# Patient Record
Sex: Female | Born: 1960 | Race: Black or African American | Hispanic: No | Smoking: Former smoker
Health system: Southern US, Community
[De-identification: ages and names within clinical notes are randomized; demographics above are authoritative.]

## PROBLEM LIST (undated history)

## (undated) DIAGNOSIS — I2510 Atherosclerotic heart disease of native coronary artery without angina pectoris: Secondary | ICD-10-CM

## (undated) DIAGNOSIS — E0590 Thyrotoxicosis, unspecified without thyrotoxic crisis or storm: Secondary | ICD-10-CM

## (undated) DIAGNOSIS — E039 Hypothyroidism, unspecified: Secondary | ICD-10-CM

## (undated) DIAGNOSIS — F32A Depression, unspecified: Secondary | ICD-10-CM

## (undated) DIAGNOSIS — C3490 Malignant neoplasm of unspecified part of unspecified bronchus or lung: Secondary | ICD-10-CM

## (undated) DIAGNOSIS — J449 Chronic obstructive pulmonary disease, unspecified: Secondary | ICD-10-CM

## (undated) DIAGNOSIS — R011 Cardiac murmur, unspecified: Secondary | ICD-10-CM

## (undated) DIAGNOSIS — D259 Leiomyoma of uterus, unspecified: Secondary | ICD-10-CM

## (undated) DIAGNOSIS — R55 Syncope and collapse: Secondary | ICD-10-CM

## (undated) DIAGNOSIS — I517 Cardiomegaly: Secondary | ICD-10-CM

## (undated) DIAGNOSIS — E119 Type 2 diabetes mellitus without complications: Secondary | ICD-10-CM

## (undated) DIAGNOSIS — J45909 Unspecified asthma, uncomplicated: Secondary | ICD-10-CM

## (undated) DIAGNOSIS — E042 Nontoxic multinodular goiter: Secondary | ICD-10-CM

## (undated) DIAGNOSIS — N939 Abnormal uterine and vaginal bleeding, unspecified: Secondary | ICD-10-CM

## (undated) DIAGNOSIS — D649 Anemia, unspecified: Secondary | ICD-10-CM

## (undated) DIAGNOSIS — F419 Anxiety disorder, unspecified: Secondary | ICD-10-CM

## (undated) DIAGNOSIS — C182 Malignant neoplasm of ascending colon: Secondary | ICD-10-CM

## (undated) DIAGNOSIS — I10 Essential (primary) hypertension: Secondary | ICD-10-CM

## (undated) DIAGNOSIS — R0600 Dyspnea, unspecified: Secondary | ICD-10-CM

## (undated) HISTORY — PX: TUBAL LIGATION: SHX77

---

## 2013-02-01 ENCOUNTER — Ambulatory Visit: Payer: Self-pay | Admitting: Medical

## 2013-02-09 ENCOUNTER — Ambulatory Visit: Payer: Self-pay | Admitting: Medical

## 2013-09-02 ENCOUNTER — Ambulatory Visit: Payer: Self-pay | Admitting: Medical

## 2014-03-09 ENCOUNTER — Ambulatory Visit: Payer: Self-pay | Admitting: Medical

## 2014-10-25 IMAGING — MG MM DIGITAL SCREENING BILAT W/ CAD
1 series · 6 of 6 positions shown · non-contrast
Comparison: none

REASON FOR EXAM: corp scr mammo no order
COMMENTS:

PROCEDURE:     MAM - MAM DGT SCR W/CAD CORP NO ORDER  - February 01, 2013  [DATE]
RESULT:     Bilateral digital screening mammogram with CAD dated 02/01/2013

[R CC · right · 6 of 6 slices shown]
[im 1/6]
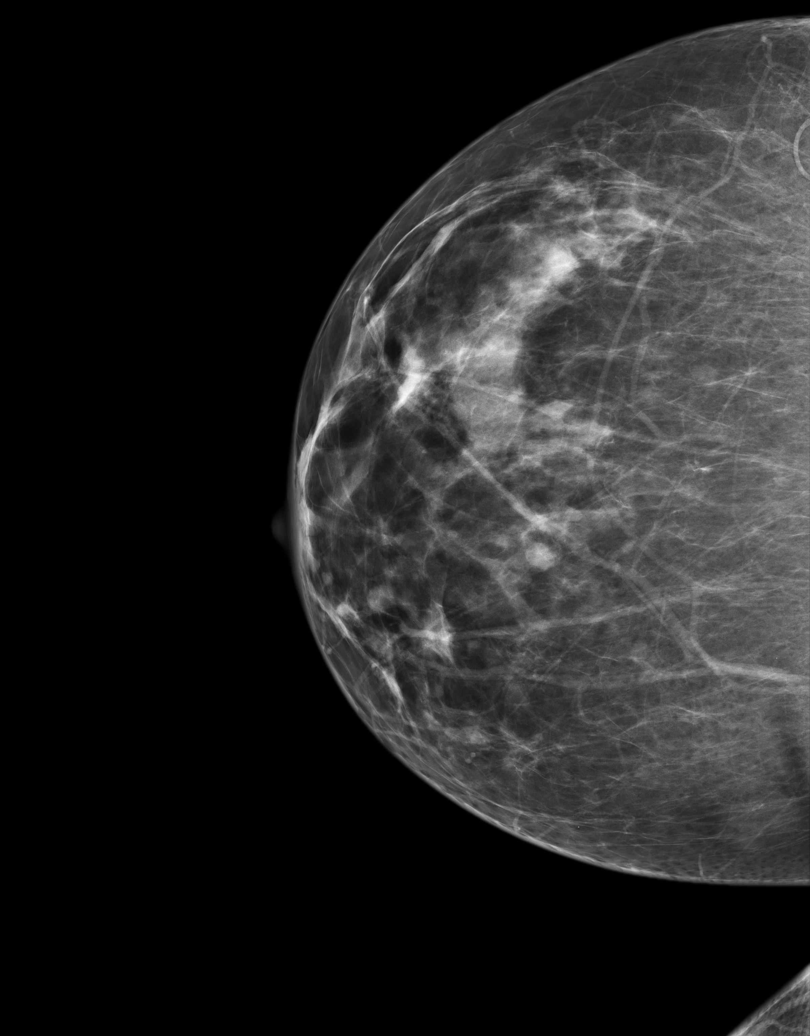
[im 2/6]
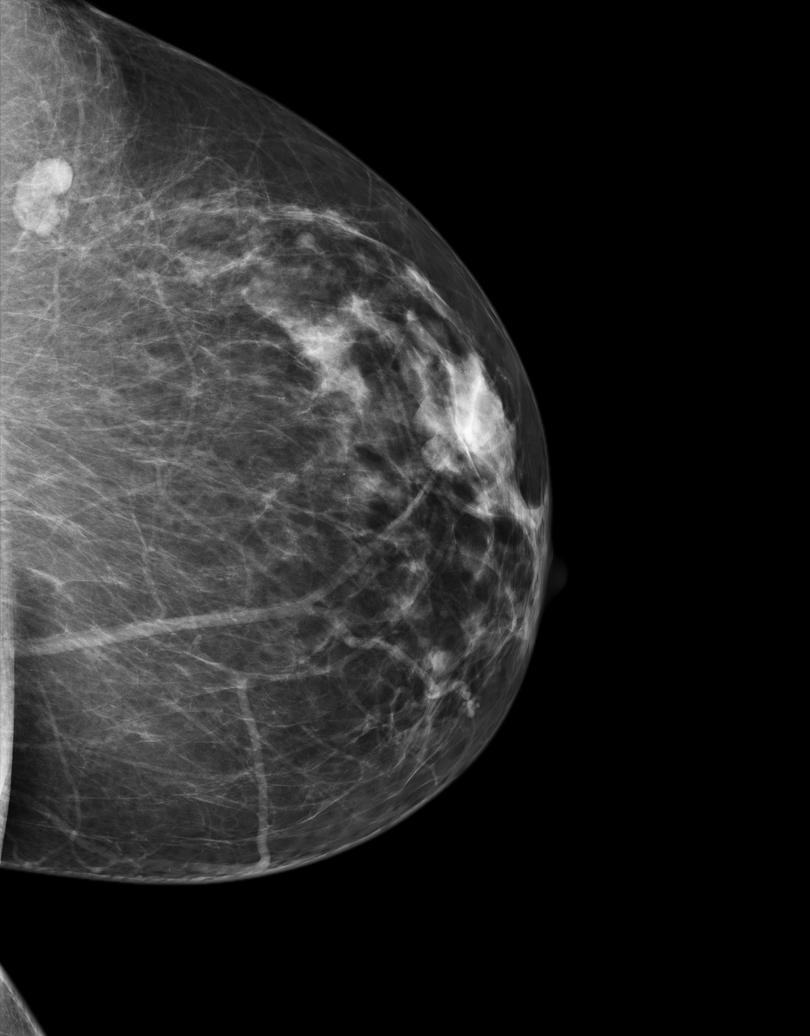
[im 3/6]
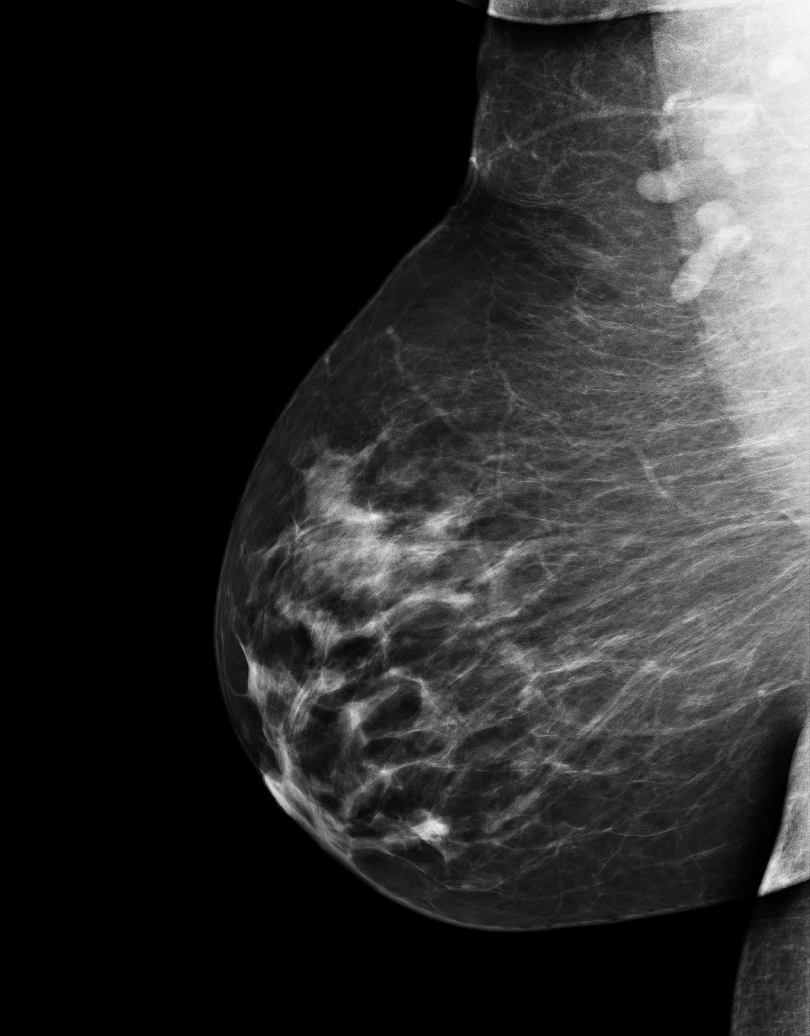
[im 4/6]
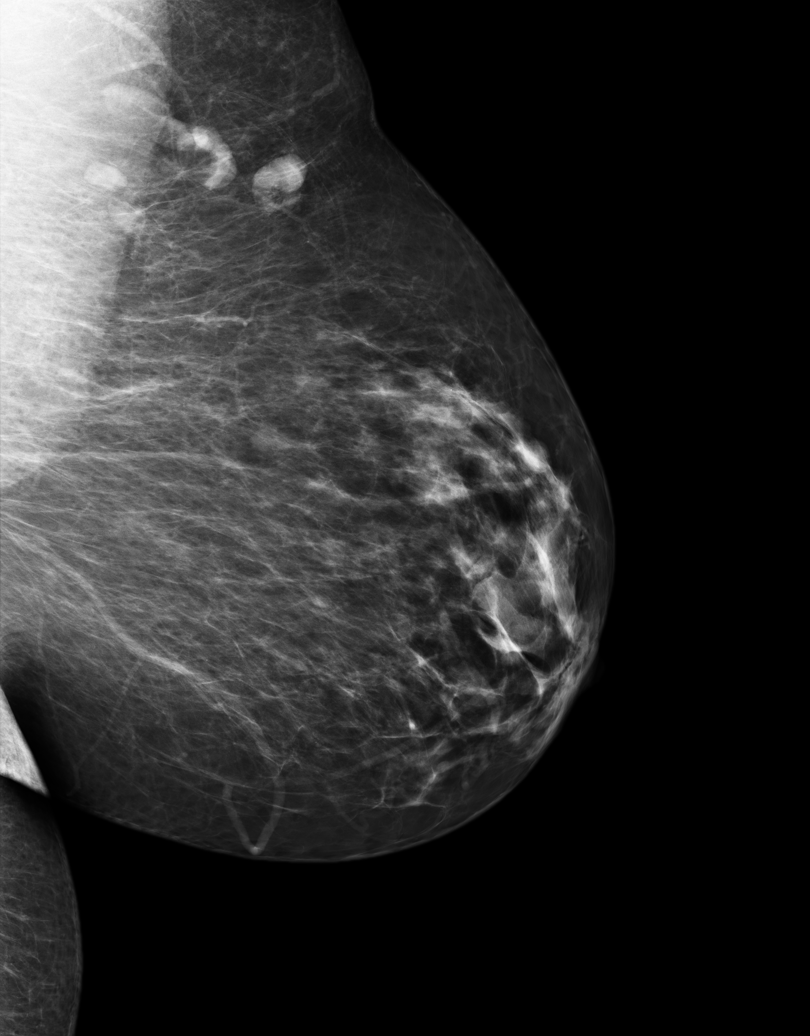
[im 5/6]
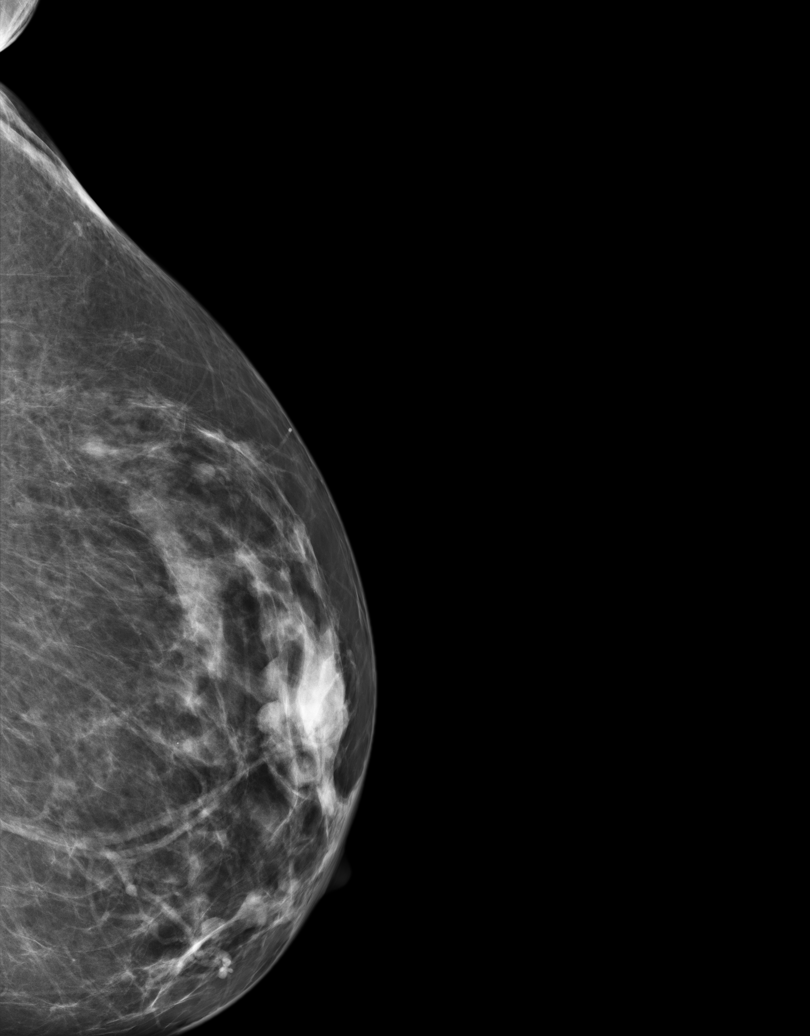
[im 6/6]
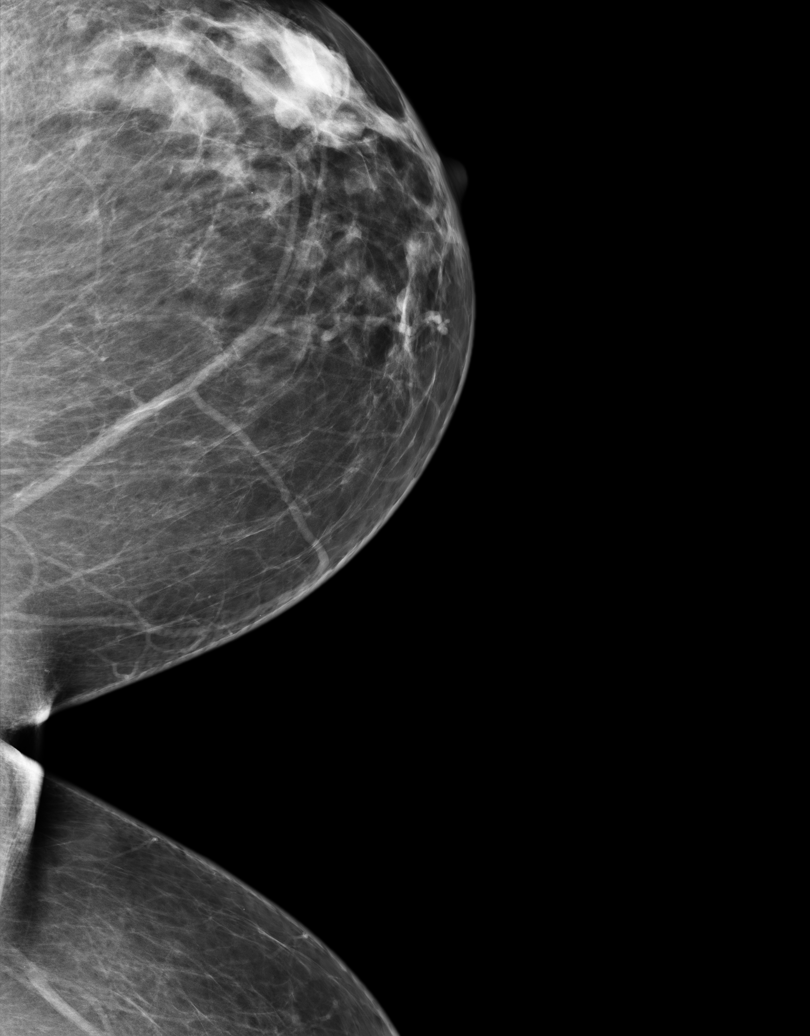

[6 of 6 positions shown; findings below may reference images not displayed]

FINDINGS: BREAST COMPOSITION: The breast composition is HETEROGENEOUSLY DENSE
(glandular tissue is 51-75%) This may decrease the sensitivity of
mammography.

Areas of asymmetric density project within the retrorenal portion of the
right breast the superior and inferior location also medial and laterally.
An area of asymmetric density projects in the prepectoralis superior lateral
portion of the left breast. Further evaluation with magnification
compression imaging of these regions is recommended.

There is no further radiographic evidence to suggest malignancy.
IMPRESSION: BI-RADS: Category 0 - Need Additional Imaging Evaluation

A NEGATIVE MAMMOGRAM REPORT DOES NOT PRECLUDE BIOPSY OR OTHER EVALUATION OF
A CLINICALLY PALPABLE OR OTHERWISE SUSPICIOUS MASS OR LESION. BREAST CANCER
FALCONER NOT BE DETECTED IN UP TO 10% OF CASES.

## 2015-02-21 ENCOUNTER — Ambulatory Visit
Admit: 2015-02-21 | Disposition: A | Discharge status: Home / Self Care | Payer: Self-pay | Attending: Medical | Admitting: Medical

## 2015-06-05 ENCOUNTER — Other Ambulatory Visit: Payer: Self-pay | Admitting: Internal Medicine

## 2015-06-05 DIAGNOSIS — E049 Nontoxic goiter, unspecified: Secondary | ICD-10-CM

## 2015-06-13 ENCOUNTER — Encounter
Admission: RE | Admit: 2015-06-13 | Discharge: 2015-06-13 | Disposition: A | Discharge status: Home / Self Care | Payer: BLUE CROSS/BLUE SHIELD | Source: Ambulatory Visit | Attending: Internal Medicine | Admitting: Internal Medicine

## 2015-06-13 DIAGNOSIS — E049 Nontoxic goiter, unspecified: Secondary | ICD-10-CM

## 2015-06-14 ENCOUNTER — Ambulatory Visit
Admission: RE | Admit: 2015-06-14 | Discharge: 2015-06-14 | Disposition: A | Discharge status: Home / Self Care | Payer: BLUE CROSS/BLUE SHIELD | Source: Ambulatory Visit | Attending: Internal Medicine | Admitting: Internal Medicine

## 2015-06-14 ENCOUNTER — Ambulatory Visit: Payer: BLUE CROSS/BLUE SHIELD

## 2015-06-14 ENCOUNTER — Other Ambulatory Visit: Payer: BLUE CROSS/BLUE SHIELD

## 2015-06-14 DIAGNOSIS — E049 Nontoxic goiter, unspecified: Secondary | ICD-10-CM | POA: Insufficient documentation

## 2015-06-14 MED ORDER — SODIUM IODIDE I-123 3.7 MBQ PO CAPS
148.6700 | ORAL_CAPSULE | Freq: Once | ORAL | Status: AC
  Administered 2015-06-14: 148.67 via ORAL

## 2015-06-15 ENCOUNTER — Encounter
Admission: RE | Admit: 2015-06-15 | Discharge: 2015-06-15 | Disposition: A | Discharge status: Home / Self Care | Payer: BLUE CROSS/BLUE SHIELD | Source: Ambulatory Visit | Attending: Internal Medicine | Admitting: Internal Medicine

## 2015-10-25 ENCOUNTER — Encounter: Payer: Self-pay | Admitting: *Deleted

## 2015-10-26 ENCOUNTER — Encounter: Payer: Self-pay | Admitting: *Deleted

## 2015-10-29 ENCOUNTER — Encounter: Payer: Self-pay | Admitting: *Deleted

## 2015-10-29 ENCOUNTER — Encounter
Admission: RE | Disposition: A | Discharge status: Home / Self Care | Payer: Self-pay | Source: Ambulatory Visit | Attending: Gastroenterology

## 2015-10-29 ENCOUNTER — Ambulatory Visit
Admission: RE | Admit: 2015-10-29 | Discharge: 2015-10-29 | Disposition: A | Discharge status: Home / Self Care | Payer: BLUE CROSS/BLUE SHIELD | Source: Ambulatory Visit | Attending: Gastroenterology | Admitting: Gastroenterology

## 2015-10-29 ENCOUNTER — Ambulatory Visit: Payer: BLUE CROSS/BLUE SHIELD | Admitting: Anesthesiology

## 2015-10-29 DIAGNOSIS — E119 Type 2 diabetes mellitus without complications: Secondary | ICD-10-CM | POA: Insufficient documentation

## 2015-10-29 DIAGNOSIS — J45909 Unspecified asthma, uncomplicated: Secondary | ICD-10-CM | POA: Insufficient documentation

## 2015-10-29 DIAGNOSIS — I10 Essential (primary) hypertension: Secondary | ICD-10-CM | POA: Insufficient documentation

## 2015-10-29 DIAGNOSIS — J449 Chronic obstructive pulmonary disease, unspecified: Secondary | ICD-10-CM | POA: Insufficient documentation

## 2015-10-29 DIAGNOSIS — D120 Benign neoplasm of cecum: Secondary | ICD-10-CM | POA: Insufficient documentation

## 2015-10-29 DIAGNOSIS — F17200 Nicotine dependence, unspecified, uncomplicated: Secondary | ICD-10-CM | POA: Diagnosis not present

## 2015-10-29 DIAGNOSIS — Z79899 Other long term (current) drug therapy: Secondary | ICD-10-CM | POA: Diagnosis not present

## 2015-10-29 DIAGNOSIS — Z7984 Long term (current) use of oral hypoglycemic drugs: Secondary | ICD-10-CM | POA: Diagnosis not present

## 2015-10-29 DIAGNOSIS — E0520 Thyrotoxicosis with toxic multinodular goiter without thyrotoxic crisis or storm: Secondary | ICD-10-CM | POA: Insufficient documentation

## 2015-10-29 DIAGNOSIS — Z1211 Encounter for screening for malignant neoplasm of colon: Secondary | ICD-10-CM | POA: Insufficient documentation

## 2015-10-29 DIAGNOSIS — K621 Rectal polyp: Secondary | ICD-10-CM | POA: Diagnosis not present

## 2015-10-29 HISTORY — DX: Essential (primary) hypertension: I10

## 2015-10-29 HISTORY — DX: Type 2 diabetes mellitus without complications: E11.9

## 2015-10-29 HISTORY — DX: Atherosclerotic heart disease of native coronary artery without angina pectoris: I25.10

## 2015-10-29 HISTORY — DX: Unspecified asthma, uncomplicated: J45.909

## 2015-10-29 HISTORY — DX: Chronic obstructive pulmonary disease, unspecified: J44.9

## 2015-10-29 HISTORY — PX: COLONOSCOPY WITH PROPOFOL: SHX5780

## 2015-10-29 HISTORY — DX: Thyrotoxicosis, unspecified without thyrotoxic crisis or storm: E05.90

## 2015-10-29 HISTORY — DX: Nontoxic multinodular goiter: E04.2

## 2015-10-29 HISTORY — DX: Cardiomegaly: I51.7

## 2015-10-29 SURGERY — COLONOSCOPY WITH PROPOFOL
Anesthesia: General

## 2015-10-29 MED ORDER — SODIUM CHLORIDE 0.9 % IV SOLN
INTRAVENOUS | Status: DC
  Administered 2015-10-29: 1000 mL via INTRAVENOUS

## 2015-10-29 MED ORDER — PROPOFOL 500 MG/50ML IV EMUL
INTRAVENOUS | Status: DC | PRN
  Administered 2015-10-29: 125 ug/kg/min via INTRAVENOUS

## 2015-10-29 MED ORDER — METHYLENE BLUE 1 % INJ SOLN
INTRAMUSCULAR | Status: DC
Start: 2015-10-29 — End: 2015-10-29
  Filled 2015-10-29: qty 1

## 2015-10-29 MED ORDER — FENTANYL CITRATE (PF) 100 MCG/2ML IJ SOLN
INTRAMUSCULAR | Status: DC | PRN
  Administered 2015-10-29: 50 ug via INTRAVENOUS

## 2015-10-29 MED ORDER — MIDAZOLAM HCL 5 MG/5ML IJ SOLN
INTRAMUSCULAR | Status: DC | PRN
  Administered 2015-10-29: 1 mg via INTRAVENOUS

## 2015-10-29 MED ORDER — SODIUM CHLORIDE 0.9 % IJ SOLN
INTRAMUSCULAR | Status: DC | PRN
  Administered 2015-10-29: 09:00:00

## 2015-10-29 MED ORDER — SODIUM CHLORIDE 0.9 % IJ SOLN
INTRAMUSCULAR | Status: DC | PRN
  Administered 2015-10-29: 10 mL via INTRAVENOUS

## 2015-10-29 MED ORDER — PROPOFOL 10 MG/ML IV BOLUS
INTRAVENOUS | Status: DC | PRN
  Administered 2015-10-29: 50 mg via INTRAVENOUS

## 2015-10-29 MED ORDER — SODIUM CHLORIDE 0.9 % IV SOLN
INTRAVENOUS | Status: DC
Start: 2015-10-29 — End: 2015-10-29

## 2015-10-29 NOTE — H&P (Signed)
  Primary Care Physician:  Vista Mink, FNP  Pre-Procedure History & Physical: HPI:  Alexis Garcia is a 54 y.o. female is here for an colonoscopy.   Past Medical History  Diagnosis Date  . Cardiomegaly   . Cardiovascular disease   . COPD (chronic obstructive pulmonary disease) ([place])   . Hypertension   . Goiter, nontoxic, multinodular   . Thyrotoxicosis   . Diabetes mellitus without complication ([place])   . Asthma     Past Surgical History  Procedure Laterality Date  . Cesarean section      Prior to Admission medications   Medication Sig Start Date End Date Taking? Authorizing Provider  benazepril-hydrochlorthiazide (LOTENSIN HCT) 10-12.5 MG tablet Take 1 tablet by mouth daily.   Yes Historical Provider, MD  Levothyroxine Sodium (SYNTHROID PO) Take by mouth.   Yes Historical Provider, MD  lisinopril (PRINIVIL,ZESTRIL) 10 MG tablet Take 10 mg by mouth daily.   Yes Historical Provider, MD  metFORMIN (GLUCOPHAGE) 500 MG tablet Take by mouth 2 (two) times daily with a meal.   Yes Historical Provider, MD  metoprolol tartrate (LOPRESSOR) 25 MG tablet Take 25 mg by mouth 2 (two) times daily.   Yes Historical Provider, MD  propranolol (INDERAL) 10 MG tablet Take 10 mg by mouth 3 (three) times daily.   Yes Historical Provider, MD    Allergies as of 10/01/2015  . (Not on File)    History reviewed. No pertinent family history.  Social History   Social History  . Marital Status: Divorced    Spouse Name: N/A  . Number of Children: N/A  . Years of Education: N/A   Occupational History  . Not on file.   Social History Main Topics  . Smoking status: Current Every Day Smoker  . Smokeless tobacco: Not on file  . Alcohol Use: Not on file  . Drug Use: Not on file  . Sexual Activity: Not on file   Other Topics Concern  . Not on file   Social History Narrative     Physical Exam: BP 160/77 mmHg  Pulse 50  Temp(Src) 96.9 F (36.1 C) (Oral)  Resp 20  Ht 5\' 6"   (1.676 m)  Wt 99.791 kg (220 lb)  BMI 35.53 kg/m2  SpO2 100% General:   Alert,  pleasant and cooperative in NAD Head:  Normocephalic and atraumatic. Neck:  Supple; no masses or thyromegaly. Lungs:  Clear throughout to auscultation.    Heart:  Regular rate and rhythm. Abdomen:  Soft, nontender and nondistended. Normal bowel sounds, without guarding, and without rebound.   Neurologic:  Alert and  oriented x4;  grossly normal neurologically.  Impression/Plan: Alexis Garcia is here for an colonoscopy to be performed for screening  Risks, benefits, limitations, and alternatives regarding  colonoscopy have been reviewed with the patient.  Questions have been answered.  All parties agreeable.   Josefine Class, MD  10/29/2015, 8:25 AM

## 2015-10-29 NOTE — Anesthesia Preprocedure Evaluation (Signed)
Anesthesia Evaluation  Patient identified by MRN, date of birth, ID band Patient awake    Reviewed: Allergy & Precautions, H&P , NPO status , Patient's Chart, lab work & pertinent test results  History of Anesthesia Complications Negative for: history of anesthetic complications  Airway Mallampati: III  TM Distance: >3 FB Neck ROM: full    Dental  (+) Poor Dentition, Missing, Upper Dentures   Pulmonary neg shortness of breath, asthma , COPD, Current Smoker,    Pulmonary exam normal breath sounds clear to auscultation       Cardiovascular Exercise Tolerance: Good hypertension, (-) angina(-) Past MI and (-) DOE Normal cardiovascular exam Rhythm:regular Rate:Normal     Neuro/Psych negative neurological ROS  negative psych ROS   GI/Hepatic negative GI ROS, Neg liver ROS,   Endo/Other  diabetes, Type 2Hyperthyroidism   Renal/GU negative Renal ROS  negative genitourinary   Musculoskeletal   Abdominal   Peds  Hematology negative hematology ROS (+)   Anesthesia Other Findings Past Medical History:   Cardiomegaly                                                 Cardiovascular disease                                       COPD (chronic obstructive pulmonary disease) (*              Hypertension                                                 Goiter, nontoxic, multinodular                               Thyrotoxicosis                                               Diabetes mellitus without complication (HCC)                 Asthma                                                      Past Surgical History:   CESAREAN SECTION                                             BMI    Body Mass Index   35.52 kg/m 2    Patient has medical clearance for this procedure   Reproductive/Obstetrics negative OB ROS                             Anesthesia Physical Anesthesia Plan  ASA: III  Anesthesia Plan:  General   Post-op Pain Management:    Induction:   Airway Management Planned:   Additional Equipment:   Intra-op Plan:   Post-operative Plan:   Informed Consent: I have reviewed the patients History and Physical, chart, labs and discussed the procedure including the risks, benefits and alternatives for the proposed anesthesia with the patient or authorized representative who has indicated his/her understanding and acceptance.   Dental Advisory Given  Plan Discussed with: Anesthesiologist, CRNA and Surgeon  Anesthesia Plan Comments:         Anesthesia Quick Evaluation

## 2015-10-29 NOTE — Anesthesia Postprocedure Evaluation (Signed)
Anesthesia Post Note  Patient: Alexis Garcia  Procedure(s) Performed: Procedure(s) (LRB): COLONOSCOPY WITH PROPOFOL (N/A)  Patient location during evaluation: Endoscopy Anesthesia Type: General Level of consciousness: awake and alert Pain management: pain level controlled Vital Signs Assessment: post-procedure vital signs reviewed and stable Respiratory status: spontaneous breathing, nonlabored ventilation, respiratory function stable and patient connected to nasal cannula oxygen Cardiovascular status: blood pressure returned to baseline and stable Postop Assessment: no signs of nausea or vomiting Anesthetic complications: no    Last Vitals:  Filed Vitals:   10/29/15 1030 10/29/15 1040  BP: 146/74 143/78  Pulse: 40 40  Temp:    Resp: 14 11    Last Pain:  Filed Vitals:   10/29/15 1040  PainSc: Asleep                 Precious Haws Piscitello

## 2015-10-29 NOTE — Transfer of Care (Signed)
Immediate Anesthesia Transfer of Care Note  Patient: Alexis Garcia  Procedure(s) Performed: Procedure(s): COLONOSCOPY WITH PROPOFOL (N/A)  Patient Location: PACU and Endoscopy Unit  Anesthesia Type:General  Level of Consciousness: awake and alert   Airway & Oxygen Therapy: Patient Spontanous Breathing and Patient connected to nasal cannula oxygen  Post-op Assessment: Report given to RN and Post -op Vital signs reviewed and stable  Post vital signs: Reviewed and stable  Last Vitals:  Filed Vitals:   10/29/15 0813 10/29/15 1000  BP: 160/77   Pulse: 50   Temp: 36.1 C 36.1 C  Resp: 20     Complications: No apparent anesthesia complications

## 2015-10-29 NOTE — Op Note (Signed)
[facility] Gastroenterology Patient Name: Alexis Garcia Procedure Date: 10/29/2015 8:45 AM MRN: [id number] Account #: [id number] Date of Birth: 10-Feb-1961 Admit Type: Outpatient Age: 54 Room: [facility] ENDO ROOM 2 Gender: Female Note Status: Finalized Procedure:         Colonoscopy Indications:       Screening for colorectal malignant neoplasm, This is the                     patient's first colonoscopy Patient Profile:   This is a 54 year old female. Providers:         Gerrit Heck. Rayann Heman, MD Referring MD:      Jordan Likes. Lavena Bullion (Referring MD) Medicines:         Propofol per Anesthesia Complications:     No immediate complications. Procedure:         Pre-Anesthesia Assessment:                    - Prior to the procedure, a History and Physical was                     performed, and patient medications, allergies and                     sensitivities were reviewed. The patient's tolerance of                     previous anesthesia was reviewed.                    After obtaining informed consent, the colonoscope was                     passed under direct vision. Throughout the procedure, the                     patient's blood pressure, pulse, and oxygen saturations                     were monitored continuously. The Colonoscope was                     introduced through the anus and advanced to the the cecum,                     identified by appendiceal orifice and ileocecal valve. The                     colonoscopy was performed without difficulty. The patient                     tolerated the procedure well. The quality of the bowel                     preparation was good. Findings:      The perianal and digital rectal examinations were normal.      A 25 mm polyp was found in the cecum. The polyp was sessile. The polyp       was removed with a piecemeal technique using a hot snare. Resection and       retrieval were complete. Fulguration to ablate the lesion  remnants by       argon plasma at 0.5 liters/minute and 20 watts was successful. To       prevent bleeding after the polypectomy,  three hemostatic clips were       successfully placed. There was no bleeding at the end of the procedure.       PIECEMEAL POLYPECTOMY.      Multiple sessile polyps were found in the rectum. The polyps were small       in size. Not removed due to procedure length      The exam was otherwise without abnormality on direct and retroflexion       views. Impression:        - One 25 mm polyp in the cecum. Resected and retrieved.                     Treated with argon plasma coagulation (APC). Clips were                     placed. PIECEMEAL POLYPECTOMY.                    - Multiple small polyps in the rectum. Not removed due to                     procedure length.                    - The examination was otherwise normal on direct and                     retroflexion views. Recommendation:    - Observe patient in GI recovery unit.                    - Resume regular diet.                    - Continue present medications.                    - Await pathology results.                    - Repeat colonoscopy in 6 months for surveillance after                     piecemeal polypectomy.                    - Return to referring physician.                    - The findings and recommendations were discussed with the                     patient.                    - The findings and recommendations were discussed with the                     patient's family. Procedure Code(s): --- Professional ---                    [phone], Colonoscopy, flexible; with removal of tumor(s),                     polyp(s), or other lesion(s) by snare technique Diagnosis Code(s): --- Professional ---                    Z12.11, Encounter for screening for malignant neoplasm of  colon                    D12.0, Benign neoplasm of cecum                    K62.1, Rectal  polyp CPT copyright 2014 American Medical Association. All rights reserved. The codes documented in this report are preliminary and upon coder review may  be revised to meet current compliance requirements. Mellody Life, MD 10/29/2015 9:56:02 AM This report has been signed electronically. Number of Addenda: 0 Note Initiated On: 10/29/2015 8:45 AM Scope Withdrawal Time: 0 hours 46 minutes 44 seconds  Total Procedure Duration: 0 hours 55 minutes 0 seconds       [facility]

## 2015-10-29 NOTE — Anesthesia Procedure Notes (Signed)
Date/Time: 10/29/2015 8:52 AM Performed by: Kennon Holter Pre-anesthesia Checklist: Patient being monitored, Timeout performed, Suction available, Emergency Drugs available and Patient identified Patient Re-evaluated:Patient Re-evaluated prior to inductionOxygen Delivery Method: Nasal cannula Preoxygenation: Pre-oxygenation with 100% oxygen Intubation Type: IV induction

## 2015-10-30 LAB — GLUCOSE, CAPILLARY: GLUCOSE-CAPILLARY: 96 mg/dL (ref 65–99)

## 2015-10-30 LAB — SURGICAL PATHOLOGY

## 2015-11-01 ENCOUNTER — Encounter: Payer: Self-pay | Admitting: Gastroenterology

## 2016-02-12 ENCOUNTER — Other Ambulatory Visit: Payer: Self-pay | Admitting: Medical

## 2016-02-12 DIAGNOSIS — Z1231 Encounter for screening mammogram for malignant neoplasm of breast: Secondary | ICD-10-CM

## 2016-02-14 ENCOUNTER — Ambulatory Visit
Admission: RE | Admit: 2016-02-14 | Discharge: 2016-02-14 | Disposition: A | Discharge status: Home / Self Care | Payer: BLUE CROSS/BLUE SHIELD | Source: Ambulatory Visit | Attending: Medical | Admitting: Medical

## 2016-02-14 DIAGNOSIS — Z1231 Encounter for screening mammogram for malignant neoplasm of breast: Secondary | ICD-10-CM | POA: Diagnosis not present

## 2017-02-23 ENCOUNTER — Other Ambulatory Visit: Payer: Self-pay | Admitting: Family Medicine

## 2017-02-23 DIAGNOSIS — Z1231 Encounter for screening mammogram for malignant neoplasm of breast: Secondary | ICD-10-CM

## 2017-02-25 ENCOUNTER — Ambulatory Visit
Admission: RE | Admit: 2017-02-25 | Discharge: 2017-02-25 | Disposition: A | Discharge status: Home / Self Care | Payer: BLUE CROSS/BLUE SHIELD | Source: Ambulatory Visit | Attending: Family Medicine | Admitting: Family Medicine

## 2017-02-25 DIAGNOSIS — Z1231 Encounter for screening mammogram for malignant neoplasm of breast: Secondary | ICD-10-CM | POA: Insufficient documentation

## 2017-06-04 ENCOUNTER — Other Ambulatory Visit: Payer: Self-pay

## 2017-06-04 DIAGNOSIS — E049 Nontoxic goiter, unspecified: Secondary | ICD-10-CM

## 2017-06-05 LAB — TSH+FREE T4
Free T4: 1.48 ng/dL (ref 0.82–1.77)
TSH: 1.04 u[IU]/mL (ref 0.450–4.500)

## 2017-06-05 LAB — T3: T3, Total: 107 ng/dL (ref 71–180)

## 2017-09-15 ENCOUNTER — Encounter: Payer: Self-pay | Admitting: Obstetrics and Gynecology

## 2017-12-18 ENCOUNTER — Encounter: Payer: Self-pay | Admitting: Obstetrics and Gynecology

## 2018-02-09 ENCOUNTER — Ambulatory Visit (INDEPENDENT_AMBULATORY_CARE_PROVIDER_SITE_OTHER): Payer: BLUE CROSS/BLUE SHIELD | Admitting: Obstetrics and Gynecology

## 2018-02-09 ENCOUNTER — Encounter: Payer: Self-pay | Admitting: Obstetrics and Gynecology

## 2018-02-09 VITALS — BP 130/79 | HR 64 | Ht 66.0 in | Wt 230.3 lb

## 2018-02-09 DIAGNOSIS — E668 Other obesity: Secondary | ICD-10-CM | POA: Diagnosis not present

## 2018-02-09 DIAGNOSIS — N924 Excessive bleeding in the premenopausal period: Secondary | ICD-10-CM | POA: Diagnosis not present

## 2018-02-09 DIAGNOSIS — D259 Leiomyoma of uterus, unspecified: Secondary | ICD-10-CM

## 2018-02-09 DIAGNOSIS — N888 Other specified noninflammatory disorders of cervix uteri: Secondary | ICD-10-CM

## 2018-02-09 DIAGNOSIS — E039 Hypothyroidism, unspecified: Secondary | ICD-10-CM

## 2018-02-09 MED ORDER — NORETHINDRONE ACETATE 5 MG PO TABS: 5.0000 mg | ORAL_TABLET | Freq: Every day | ORAL | 2 refills | Status: DC

## 2018-02-09 NOTE — Progress Notes (Signed)
GYNECOLOGY CLINIC PROGRESS NOTE Subjective:    Alexis Garcia is a 57 y.o. [phone] female who presents with uterine fibroids and abnormal uterine bleeding.  She was referred from Threasa Alpha, [place] (at [facility]). Periods are irregular, lasting 2-3 weeks at a time, sometimes stopping for 2 days and then returning. Bleeding has been irregular over the past year (mostly spotting alternating with a light to moderate cycle) , but since January has become much heavier and longer lasting.  Dysmenorrhea:mild, occurring intermittently throughout menses. Cyclic symptoms include irritability and hot flashes. No intermenstrual bleeding, spotting, or discharge. Patient reports changing a pad q 30 minutes with passage of dime-sized clots during her heaviest days. Denies chest pain, SOB.    Gynecologic History:  Menarche age: 61 LMP unsure, but approximately 2-3 weeks ago.  Current contraception: tubal ligation History of abnormal Pap smear: no. Last pap smear: November 2018, normal.  Family history of breast, uterine or ovarian cancer: unknown, patient was adopted.  Regular self breast exam: yes History of abnormal mammogram: no. Last mammogram: normal.  Next due in April 2019.      OB History  Gravida Para Term Preterm AB Living  4 4 2 1       SAB TAB Ectopic Multiple Live Births               # Outcome Date GA Lbr Len/2nd Weight Sex Delivery Anes PTL Lv  4 Term 89    F Vag-Spont     3 Term 70    M CS-Unspec     2 Para 1985             Birth Comments: miscarriage  1 Preterm 89    F Vag-Spont         Past Medical History:  Diagnosis Date  . Asthma   . Cardiomegaly   . Cardiovascular disease   . COPD (chronic obstructive pulmonary disease) ([place])   . Diabetes mellitus without complication ([place])   . Goiter, nontoxic, multinodular   . Hypertension   . Thyrotoxicosis     Family History  Adopted: Yes    Past Surgical History:  Procedure Laterality Date  .  CESAREAN SECTION    . COLONOSCOPY WITH PROPOFOL N/A 10/29/2015   Procedure: COLONOSCOPY WITH PROPOFOL;  Surgeon: Josefine Class, MD;  Location: [facility] ENDOSCOPY;  Service: Endoscopy;  Laterality: N/A;    Social History   Socioeconomic History  . Marital status: Divorced    Spouse name: Not on file  . Number of children: Not on file  . Years of education: Not on file  . Highest education level: Not on file  Occupational History  . Not on file  Social Needs  . Financial resource strain: Not on file  . Food insecurity:    Worry: Not on file    Inability: Not on file  . Transportation needs:    Medical: Not on file    Non-medical: Not on file  Tobacco Use  . Smoking status: Current Every Day Smoker  . Smokeless tobacco: Never Used  Substance and Sexual Activity  . Alcohol use: Never    Frequency: Never  . Drug use: Never  . Sexual activity: Yes    Birth control/protection: None  Lifestyle  . Physical activity:    Days per week: Not on file    Minutes per session: Not on file  . Stress: Not on file  Relationships  . Social connections:    Talks  on phone: Not on file    Gets together: Not on file    Attends religious service: Not on file    Active member of club or organization: Not on file    Attends meetings of clubs or organizations: Not on file    Relationship status: Not on file  . Intimate partner violence:    Fear of current or ex partner: Not on file    Emotionally abused: Not on file    Physically abused: Not on file    Forced sexual activity: Not on file  Other Topics Concern  . Not on file  Social History Narrative  . Not on file    Current Outpatient Medications on File Prior to Visit  Medication Sig Dispense Refill  . benazepril-hydrochlorthiazide (LOTENSIN HCT) 10-12.5 MG tablet Take 1 tablet by mouth daily.    . hydrochlorothiazide (HYDRODIURIL) 25 MG tablet Take 25 mg by mouth daily.    . Levothyroxine Sodium (SYNTHROID PO) Take 75 mcg by mouth  every morning.     Marland Kitchen lisinopril (PRINIVIL,ZESTRIL) 2.5 MG tablet Take 2.5 mg by mouth daily.    . metFORMIN (GLUCOPHAGE) 500 MG tablet Take by mouth 2 (two) times daily with a meal.    . metoprolol tartrate (LOPRESSOR) 25 MG tablet Take 25 mg by mouth 2 (two) times daily.    . phentermine (ADIPEX-P) 37.5 MG tablet Take 37.5 mg by mouth daily before breakfast.     No current facility-administered medications on file prior to visit.     No Known Allergies  Review of Systems Pertinent items noted in HPI and remainder of comprehensive ROS otherwise negative.    Objective:     BP 130/79   Pulse 64   Ht 5\' 6"  (1.676 m)   Wt 230 lb 4.8 oz (104.5 kg)   BMI 37.17 kg/m   General appearance: alert, no distress and moderately obese Abdomen: soft, non-tender; bowel sounds normal; no masses,  no organomegaly Pelvic: external genitalia normal, rectovaginal septum normal.  Vagina with moderate amount of blood in the vaginal vault.  Attempted to visualize cervix but difficult due to amount of bleeding, even after wiping blood away.  Cervix appears abnormal, unsure if cervix is actually being visualized as it appears that there is a mass protruding from the cervix.  Cervix with area of firm nodularity on left,  and no motion tenderness.  Uterus difficult to palpate due to body habitus but does not feel enlarged.  Adnexae non-palpable, nontender bilaterally.  Extremities: extremities normal, atraumatic, no cyanosis or edema Skin: Skin color, texture, turgor normal. No rashes or lesions Neurologic: Grossly normal      Labs:   Lab Results  Component Value Date   TSH 1.040 06/04/2017    Assessment:    Symptomatic uterine fibroids.   Abnormal uterine bleeding (likely perimenopausal menorrhagia) Cervical mass H/o hypothyroidism Moderate obesity  Plan:   1. Symptomatic uterine fibroids - given generalized management options for symptomatic fibroids as her records from her PCP had not been  received prior to patient's appointment.  Options included hormonal therapy (progesterone methods including Aygestin, Mirena IUD based on size and location of fibroids, hysteroscopic myomectomy if submucosal location, and hysterectomy).  Will await patient's results to see which option may be best.  Currently patient considering a less invasive approach.  2. Abnormal uterine bleeding - moderate bleeding noted on today's exam, also with cervical mass present.  Biopsy of cervical mass performed and sent to pathology.  Unsure if  bleeding is due to perimenopausal status or fibroids, or cervical mass. Was planning to attempt an endometrial biopsy today but unable to perform due to cervical mass. Given Aygestin to help bleeding. Will reorder CBC to reassess patient's hemodynamic stability in light of amount of bleeding today. Last labs done in February by PCP.  Will await records. If fibroids not intracavitary or submucosal, can also consider endometrial ablation for management.  3. Cervical mass - possibly aborting myoma, however cannot r/o malignancy.  Unable to visualize actual cervix. Biopsy done today. Will await pathology results.  4. H/o hypothyroidism - patient has seen Endocrinologist for thyroid issues, currently normal.   5. Moderate obesity - can be contributing to abnormal bleeding.  Patient is at risk for endometrial hyperplasia. Unable to sample endometrium due to presence  Patient to f/u in 1 week.  Will have patient sign medical release from PCP to get most recent labs and ultrasound.     Rubie Maid, MD [facility]

## 2018-02-09 NOTE — Progress Notes (Signed)
Pt is having pain and heavy bleeding from uterine fibroids.

## 2018-02-10 LAB — CBC WITH DIFFERENTIAL/PLATELET
BASOS ABS: 0 10*3/uL (ref 0.0–0.2)
BASOS: 0 %
EOS (ABSOLUTE): 0.2 10*3/uL (ref 0.0–0.4)
Eos: 3 %
Hematocrit: 34.9 % (ref 34.0–46.6)
Hemoglobin: 11.6 g/dL (ref 11.1–15.9)
IMMATURE GRANULOCYTES: 0 %
Immature Grans (Abs): 0 10*3/uL (ref 0.0–0.1)
Lymphocytes Absolute: 2.2 10*3/uL (ref 0.7–3.1)
Lymphs: 39 %
MCH: 28.5 pg (ref 26.6–33.0)
MCHC: 33.2 g/dL (ref 31.5–35.7)
MCV: 86 fL (ref 79–97)
MONOS ABS: 0.4 10*3/uL (ref 0.1–0.9)
Monocytes: 7 %
NEUTROS PCT: 51 %
Neutrophils Absolute: 2.8 10*3/uL (ref 1.4–7.0)
PLATELETS: 324 10*3/uL (ref 150–379)
RBC: 4.07 x10E6/uL (ref 3.77–5.28)
RDW: 14.9 % (ref 12.3–15.4)
WBC: 5.6 10*3/uL (ref 3.4–10.8)

## 2018-02-12 LAB — PATHOLOGY

## 2018-02-13 ENCOUNTER — Encounter: Payer: Self-pay | Admitting: Obstetrics and Gynecology

## 2018-02-16 ENCOUNTER — Ambulatory Visit (INDEPENDENT_AMBULATORY_CARE_PROVIDER_SITE_OTHER): Payer: BLUE CROSS/BLUE SHIELD | Admitting: Obstetrics and Gynecology

## 2018-02-16 ENCOUNTER — Other Ambulatory Visit: Payer: BLUE CROSS/BLUE SHIELD

## 2018-02-16 ENCOUNTER — Encounter: Payer: Self-pay | Admitting: Obstetrics and Gynecology

## 2018-02-16 ENCOUNTER — Other Ambulatory Visit: Payer: Self-pay | Admitting: Obstetrics and Gynecology

## 2018-02-16 VITALS — BP 113/62 | HR 72 | Ht 66.0 in | Wt 227.4 lb

## 2018-02-16 DIAGNOSIS — R5383 Other fatigue: Secondary | ICD-10-CM

## 2018-02-16 DIAGNOSIS — N939 Abnormal uterine and vaginal bleeding, unspecified: Secondary | ICD-10-CM | POA: Diagnosis not present

## 2018-02-16 DIAGNOSIS — D259 Leiomyoma of uterus, unspecified: Secondary | ICD-10-CM

## 2018-02-16 NOTE — Progress Notes (Signed)
GYNECOLOGY PROGRESS NOTE  Subjective:    Patient ID: Alexis Garcia, female    DOB: 1961-05-15, 57 y.o.   MRN: [id number]  HPI  Patient is a 57 y.o. G71P2100 female who presents for 1 week f/u of abnormal uterine bleeding (suspected perimenopausal) and cervical mass (biopsy performed).  She was placed on Aygestin last visit due to heaviness of bleeding.  She notes that the bleeding has slowed some, but still continues to have moderate bleeding. Presents for further workup with endometrial biopsy.   The following portions of the patient's history were reviewed and updated as appropriate: allergies, current medications, past family history, past medical history, past social history, past surgical history and problem list.  Review of Systems A comprehensive review of systems was negative except for: Constitutional: positive for fatigue   Objective:   Blood pressure 113/62, pulse 72, height 5\' 6"  (1.676 m), weight 227 lb 6.4 oz (103.1 kg). General appearance: alert and no distress Abdomen: soft, non-tender; bowel sounds normal; no masses,  no organomegaly Pelvic: external genitalia normal, rectovaginal septum normal.  Vagina with moderate amount of dark red blood.  Cervix not visualized, mass protruding from cervix appears nodular, medium firmness, ~ 2-3 cm.  Uterus mobile, nontender, normal shape and size.  Adnexae non-palpable, nontender bilaterally.     Pathology:  Diagnosis:  CERVIX, BIOPSY:  BENIGN CERVICAL TISSUE WITH SUBMUCOSAL LEIOMYOMA. NEGATIVE FOR DYSPLASIA,  VIRAL EFFECT, AND MALIGNANCY.    Imaging (External Pelvic Ultrasound performed 01/05/2018):  1. Uterus measures 8.0 x 5.71 x 4.25 cm.  Two fibroids noted.  One in the posterior fundus measures 2.34 x 2.6 cm with calcification.  A second fibroid is in the lower uterine segment measuring 2.2 x 2.2 cm. 2.  Endometrium is not identified 3.  Ovaries are normal.  Right ovary measures 3.44 x 2.56 cm the.  The left ovary measures  3.24 x 2.69 cm.  4.  Cul-de-sac is negative for abnormal fluid   Assessment:   Aborting myoma Abnormal uterine bleeding (perimenopausal) Fatigue  Plan:   - Cervical mass with biopsy results noting submucosal fibroid, likely aborting myoma.  Myomectomy performed today with use of tenaculum, twisting fibroid at base of stalk until removal.  Good hemostasis was noted.  Will send to pathology.  - Abnormal uterine bleeding - endometrial biopsy performed today (see below) after aborting fibroid was removed.  Reviewed patient's records from PCP including ultrasound, which noted patient having 2 fibroids, 1 in LUS (likely the aborting one removed today), and one in posterior fundal uterus.  Currently on Aygestin. Desires to stay on Aygestin for now (for at least 3 months), then may consider endometrial ablation after that time.  Will notify patient by phone of results of endometrial biopsy.  - Fatigue - patient notes fatigue, however labs noting normal TSH and relatively normal Hgb/Hct.  Is taking a multi-vitamin which she notes is helping.  - F/u in 3 months to reassess symptoms.     Endometrial Biopsy Procedure Note  The patient is positioned on the exam table in the dorsal lithotomy position. Bimanual exam confirms uterine position and size. A Graves speculum is placed into the vagina. A single toothed tenaculum is placed onto the anterior lip of the cervix. The pipette is placed into the endocervical canal and is advanced to the uterine fundus. Using a piston like technique, with vacuum created by withdrawing the stylus, the endometrial specimen is obtained and transferred to the biopsy container. Minimal bleeding is encountered.  The procedure is well tolerated.   Uterine Position: mid    Uterine Length:  8 cm   Uterine Specimen: Lush  Post procedure instructions are given. The patient is scheduled for follow up appointment.      Rubie Maid, MD [facility]

## 2018-02-16 NOTE — Progress Notes (Signed)
Pt has been really tired, cold with chills this weekend. No other problems or concerns.

## 2018-02-19 LAB — PATHOLOGY

## 2018-03-02 ENCOUNTER — Other Ambulatory Visit: Payer: Self-pay | Admitting: Family Medicine

## 2018-03-02 DIAGNOSIS — Z1231 Encounter for screening mammogram for malignant neoplasm of breast: Secondary | ICD-10-CM

## 2018-03-09 ENCOUNTER — Encounter: Payer: Self-pay | Admitting: Radiology

## 2018-03-09 ENCOUNTER — Ambulatory Visit
Admission: RE | Admit: 2018-03-09 | Discharge: 2018-03-09 | Disposition: A | Discharge status: Home / Self Care | Payer: BLUE CROSS/BLUE SHIELD | Source: Ambulatory Visit | Attending: Family Medicine | Admitting: Family Medicine

## 2018-03-09 DIAGNOSIS — Z1231 Encounter for screening mammogram for malignant neoplasm of breast: Secondary | ICD-10-CM | POA: Diagnosis not present

## 2018-05-12 ENCOUNTER — Telehealth: Payer: Self-pay | Admitting: Obstetrics and Gynecology

## 2018-05-12 DIAGNOSIS — N924 Excessive bleeding in the premenopausal period: Secondary | ICD-10-CM

## 2018-05-12 MED ORDER — NORETHINDRONE ACETATE 5 MG PO TABS: 5.0000 mg | ORAL_TABLET | Freq: Every day | ORAL | 1 refills | Status: DC

## 2018-05-12 NOTE — Telephone Encounter (Signed)
Spoke with pt and informed her that her prescribe had been refilled.

## 2018-05-12 NOTE — Telephone Encounter (Signed)
Patient called requesting a refill on Norethindrone 5 mg. She states she knows she has an upcoming appointment but she is completely out. She uses the [facility] on [address]. Thanks

## 2018-05-18 ENCOUNTER — Ambulatory Visit: Payer: BLUE CROSS/BLUE SHIELD | Admitting: Obstetrics and Gynecology

## 2018-05-18 ENCOUNTER — Encounter: Payer: Self-pay | Admitting: Obstetrics and Gynecology

## 2018-05-18 VITALS — BP 144/78 | HR 71 | Ht 66.0 in | Wt 234.8 lb

## 2018-05-18 DIAGNOSIS — N924 Excessive bleeding in the premenopausal period: Secondary | ICD-10-CM | POA: Insufficient documentation

## 2018-05-18 DIAGNOSIS — D259 Leiomyoma of uterus, unspecified: Secondary | ICD-10-CM | POA: Insufficient documentation

## 2018-05-18 NOTE — Progress Notes (Signed)
Pt stated that since she has been on the medication prescribed she has noticed that her bleeding has stopped.

## 2018-05-18 NOTE — Progress Notes (Signed)
    GYNECOLOGY PROGRESS NOTE  Subjective:    Patient ID: Alexis Garcia, female    DOB: 08-01-1961, 57 y.o.   MRN: [id number]  HPI  Patient is a 57 y.o. G23P2100 female who presents for 3 month f/u of perimenopausal bleeding.  She has a history of fibroids.  She is currently on Aygestin which she notes has stopped her bleeding, however she is complaining that since starting the medication she has had weight gain (at least 10-15 lbs).  Reports that her PCP also recently increased her thyroid medication in case this was the cause of her weight issues.    The following portions of the patient's history were reviewed and updated as appropriate: allergies, current medications, past family history, past medical history, past social history, past surgical history and problem list.  Review of Systems Pertinent items noted in HPI and remainder of comprehensive ROS otherwise negative.   Objective:   Blood pressure (!) 144/78, pulse 71, height 5\' 6"  (1.676 m), weight 234 lb 12.8 oz (106.5 kg), last menstrual period 10/16/2015. General appearance: alert and no distress Exam deferred today.   Assessment:   Perimenopausal abnormal bleeding Uterine fibroids  Plan:   Discussion had with patient that if she desired to continue medication management as it has helped her symptoms, the Aygestin could be changed to a different progesterone, or she could consider a Mirena IUD to possibly eliminate the side effects of weight gain from current medication.  Patient notes that she is now considering surgical management with the endometrial ablation.  She states that she has spoken with her friends and believes this would be the best option at this time.  Discussed risks/benefits of endometrial ablation.  Handout given. Recent endometrial biopsy was negative.  Uterine fibroids noted to be posterior, should not cause problems with ablation but can perform myomectomy if invading the cavity. Will schedule for week of  August 12th (can do the 14th or 16th).  To f/u in 3 weeks for pre-operative exam.   A total of 15 minutes were spent face-to-face with the patient during this encounter and over half of that time dealt with counseling and coordination of care.   Rubie Maid, MD [facility]

## 2018-06-08 ENCOUNTER — Other Ambulatory Visit: Payer: Self-pay

## 2018-06-09 ENCOUNTER — Ambulatory Visit (INDEPENDENT_AMBULATORY_CARE_PROVIDER_SITE_OTHER): Payer: BLUE CROSS/BLUE SHIELD | Admitting: Obstetrics and Gynecology

## 2018-06-09 ENCOUNTER — Encounter: Payer: Self-pay | Admitting: Obstetrics and Gynecology

## 2018-06-09 ENCOUNTER — Encounter
Admission: RE | Admit: 2018-06-09 | Discharge: 2018-06-09 | Disposition: A | Discharge status: Home / Self Care | Payer: BLUE CROSS/BLUE SHIELD | Source: Ambulatory Visit | Attending: Obstetrics and Gynecology | Admitting: Obstetrics and Gynecology

## 2018-06-09 ENCOUNTER — Other Ambulatory Visit: Payer: Self-pay

## 2018-06-09 VITALS — BP 138/77 | HR 74 | Ht 66.0 in | Wt 237.2 lb

## 2018-06-09 DIAGNOSIS — I10 Essential (primary) hypertension: Secondary | ICD-10-CM | POA: Insufficient documentation

## 2018-06-09 DIAGNOSIS — N924 Excessive bleeding in the premenopausal period: Secondary | ICD-10-CM

## 2018-06-09 DIAGNOSIS — R001 Bradycardia, unspecified: Secondary | ICD-10-CM | POA: Diagnosis not present

## 2018-06-09 DIAGNOSIS — Z01818 Encounter for other preprocedural examination: Secondary | ICD-10-CM | POA: Insufficient documentation

## 2018-06-09 DIAGNOSIS — D259 Leiomyoma of uterus, unspecified: Secondary | ICD-10-CM

## 2018-06-09 HISTORY — DX: Hypothyroidism, unspecified: E03.9

## 2018-06-09 HISTORY — DX: Abnormal uterine and vaginal bleeding, unspecified: N93.9

## 2018-06-09 LAB — CBC
HCT: 36.2 % (ref 35.0–47.0)
Hemoglobin: 12.1 g/dL (ref 12.0–16.0)
MCH: 27.7 pg (ref 26.0–34.0)
MCHC: 33.3 g/dL (ref 32.0–36.0)
MCV: 83.2 fL (ref 80.0–100.0)
PLATELETS: 274 10*3/uL (ref 150–440)
RBC: 4.35 MIL/uL (ref 3.80–5.20)
RDW: 18.6 % — AB (ref 11.5–14.5)
WBC: 6.4 10*3/uL (ref 3.6–11.0)

## 2018-06-09 LAB — BASIC METABOLIC PANEL
Anion gap: 7 (ref 5–15)
BUN: 16 mg/dL (ref 6–20)
CALCIUM: 8.8 mg/dL — AB (ref 8.9–10.3)
CO2: 27 mmol/L (ref 22–32)
Chloride: 107 mmol/L (ref 98–111)
Creatinine, Ser: 0.91 mg/dL (ref 0.44–1.00)
GFR calc Af Amer: >60 mL/min (ref >60)
Glucose, Bld: 113 mg/dL — ABNORMAL HIGH (ref 70–99)
Potassium: 4 mmol/L (ref 3.5–5.1)
Sodium: 141 mmol/L (ref 135–145)

## 2018-06-09 NOTE — Progress Notes (Signed)
Pt stated that she is doing well no complaints.  

## 2018-06-09 NOTE — H&P (View-Only) (Signed)
GYNECOLOGY PROGRESS NOTE  Subjective:    Patient ID: Alexis Garcia, female    DOB: December 21, 1960, 57 y.o.   MRN: [id number]  HPI  Patient is a 57 y.o. G46P2100 female who presents for pre-operative examination. She currently has abnormal perimenopausal bleeding, desiring surgical intervention with Hysteroscopy D&C with endometrial ablation. She also has a PMH of fibroids.  Patient denies complaints today. She is currently on Aygestin to manage her bleeding.   The following portions of the patient's history were reviewed and updated as appropriate: allergies, current medications, past family history, past medical history, past social history, past surgical history and problem list.  Review of Systems Pertinent items noted in HPI and remainder of comprehensive ROS otherwise negative.   Objective:   Blood pressure 138/77, pulse 74, height 5\' 6"  (1.676 m), weight 237 lb 3.2 oz (107.6 kg), last menstrual period 10/16/2015. General appearance: alert and no distress Abdomen: soft, non-tender; bowel sounds normal; no masses,  no organomegaly Pelvic: deferred See H&P for remainder of exam details.     Labs:  Lab Results  Component Value Date   WBC 6.4 06/09/2018   HGB 12.1 06/09/2018   HCT 36.2 06/09/2018   MCV 83.2 06/09/2018   PLT 274 06/09/2018   Lab Results  Component Value Date   TSH 1.040 06/04/2017    Pathology:  ENDOMETRIUM, BIOPSY:  SECRETORY ENDOMETRIUM. NO HYPERPLASIA OR CARCINOMA.   Last pap smear 09/2017, normal.   Imaging (External Pelvic Ultrasound performed 01/05/2018):  1. Uterus measures 8.0 x 5.71 x 4.25 cm.  Two fibroids noted.  One in the posterior fundus measures 2.34 x 2.6 cm with calcification.  A second fibroid is in the lower uterine segment measuring 2.2 x 2.2 cm. 2.  Endometrium is not identified 3.  Ovaries are normal.  Right ovary measures 3.44 x 2.56 cm the.  The left ovary measures 3.24 x 2.69 cm.  4.  Cul-de-sac is negative for abnormal  fluid  Assessment:   Abnormal perimenopausal bleeding H/o fibroid uterus  Plan:   - Patient desires surgical management with Hysteroscopy D&C with endometrial ablation ([facility]). May also require hysteroscopic myomectomy depending on location of fibroids.  The risks of surgery were discussed in detail with the patient including but not limited to: bleeding which may require transfusion or reoperation; infection which may require prolonged hospitalization or re-hospitalization and antibiotic therapy; injury to bowel, bladder, ureters and major vessels or other surrounding organs; need for additional procedures including laparotomy; thromboembolic phenomenon, incisional problems and other postoperative or anesthesia complications.  Patient was told that the likelihood that her condition and symptoms will be treated effectively with this surgical management was very high; the postoperative expectations were also discussed in detail. The patient also understands the alternative treatment options which were discussed in full. All questions were answered.  She was told that she will be contacted by our surgical scheduler regarding the time and date of her surgery; routine preoperative instructions of having nothing to eat or drink after midnight on the day prior to surgery and also coming to the hospital 1.5 hours prior to her time of surgery were also emphasized.  She was told she may be called for a preoperative appointment about a week prior to surgery and will be given further preoperative instructions at that visit. Printed patient education handouts about the procedure were given to the patient to review at home.  Surgery scheduled for 06/25/2018.  - Continue Aygestin for management of bleeding.  Rubie Maid, MD [facility]

## 2018-06-09 NOTE — Progress Notes (Signed)
GYNECOLOGY PROGRESS NOTE  Subjective:    Patient ID: Alexis Garcia, female    DOB: [birth year], 57 y.o.   MRN: [id number]  HPI  Patient is a 57 y.o. G45P2100 female who presents for pre-operative examination. She currently has abnormal perimenopausal bleeding, desiring surgical intervention with Hysteroscopy D&C with endometrial ablation. She also has a PMH of fibroids.  Patient denies complaints today. She is currently on Aygestin to manage her bleeding.   The following portions of the patient's history were reviewed and updated as appropriate: allergies, current medications, past family history, past medical history, past social history, past surgical history and problem list.  Review of Systems Pertinent items noted in HPI and remainder of comprehensive ROS otherwise negative.   Objective:   Blood pressure 138/77, pulse 74, height 5\' 6"  (1.676 m), weight 237 lb 3.2 oz (107.6 kg), last menstrual period 10/16/2015. General appearance: alert and no distress Abdomen: soft, non-tender; bowel sounds normal; no masses,  no organomegaly Pelvic: deferred See H&P for remainder of exam details.     Labs:  Lab Results  Component Value Date   WBC 6.4 06/09/2018   HGB 12.1 06/09/2018   HCT 36.2 06/09/2018   MCV 83.2 06/09/2018   PLT 274 06/09/2018   Lab Results  Component Value Date   TSH 1.040 06/04/2017    Pathology:  ENDOMETRIUM, BIOPSY:  SECRETORY ENDOMETRIUM. NO HYPERPLASIA OR CARCINOMA.   Last pap smear 09/2017, normal.   Imaging (External Pelvic Ultrasound performed 01/05/2018):  1. Uterus measures 8.0 x 5.71 x 4.25 cm.  Two fibroids noted.  One in the posterior fundus measures 2.34 x 2.6 cm with calcification.  A second fibroid is in the lower uterine segment measuring 2.2 x 2.2 cm. 2.  Endometrium is not identified 3.  Ovaries are normal.  Right ovary measures 3.44 x 2.56 cm the.  The left ovary measures 3.24 x 2.69 cm.  4.  Cul-de-sac is negative for abnormal  fluid  Assessment:   Abnormal perimenopausal bleeding H/o fibroid uterus  Plan:   - Patient desires surgical management with Hysteroscopy D&C with endometrial ablation ([facility]). May also require hysteroscopic myomectomy depending on location of fibroids.  The risks of surgery were discussed in detail with the patient including but not limited to: bleeding which may require transfusion or reoperation; infection which may require prolonged hospitalization or re-hospitalization and antibiotic therapy; injury to bowel, bladder, ureters and major vessels or other surrounding organs; need for additional procedures including laparotomy; thromboembolic phenomenon, incisional problems and other postoperative or anesthesia complications.  Patient was told that the likelihood that her condition and symptoms will be treated effectively with this surgical management was very high; the postoperative expectations were also discussed in detail. The patient also understands the alternative treatment options which were discussed in full. All questions were answered.  She was told that she will be contacted by our surgical scheduler regarding the time and date of her surgery; routine preoperative instructions of having nothing to eat or drink after midnight on the day prior to surgery and also coming to the hospital 1.5 hours prior to her time of surgery were also emphasized.  She was told she may be called for a preoperative appointment about a week prior to surgery and will be given further preoperative instructions at that visit. Printed patient education handouts about the procedure were given to the patient to review at home.  Surgery scheduled for 06/25/2018.  - Continue Aygestin for management of bleeding.  Rubie Maid, MD [facility]

## 2018-06-09 NOTE — Patient Instructions (Signed)
You are scheduled for surgery on 06/25/2018 for Hysteroscopy with endometrial ablation.  Nothing to eat after midnight on day prior to surgery.  Do not take any medications unless recommended by your provider on day prior to surgery.  Do not take NSAIDs (Motrin, Aleve) or aspirin 7 days prior to surgery.  You may take Tylenol products for minor aches and pains.  You will receive a prescription for pain medications post-operatively.  You will be contacted by phone approximately 1 week prior to surgery to schedule pre-admit appointment.  Please call the office if you have any questions regarding your upcoming surgery.

## 2018-06-09 NOTE — Patient Instructions (Signed)
Your procedure is scheduled on: 06/25/18 Fri Report to Same Day Surgery 2nd floor medical mall [facility] Entrance-take elevator on left to 2nd floor.  Check in with surgery information desk.) To find out your arrival time please call [phone] between 1PM - 3PM on 06/24/18 Thurs  Remember: Instructions that are not followed completely may result in serious medical risk, up to and including death, or upon the discretion of your surgeon and anesthesiologist your surgery may need to be rescheduled.    _x___ 1. Do not eat food after midnight the night before your procedure. You may drink clear liquids up to 2 hours before you are scheduled to arrive at the hospital for your procedure.  Do not drink clear liquids within 2 hours of your scheduled arrival to the hospital.  Clear liquids include  --Water or Apple juice without pulp  --Clear carbohydrate beverage such as ClearFast or Gatorade  --Black Coffee or Clear Tea (No milk, no creamers, do not add anything to                  the coffee or Tea Type 1 and type 2 diabetics should only drink water.  No gum chewing or hard candies.     __x__ 2. No Alcohol for 24 hours before or after surgery.   __x__3. No Smoking or e-cigarettes for 24 prior to surgery.  Do not use any chewable tobacco products for at least 6 hour prior to surgery   ____  4. Bring all medications with you on the day of surgery if instructed.    __x__ 5. Notify your doctor if there is any change in your medical condition     (cold, fever, infections).    x___6. On the morning of surgery brush your teeth with toothpaste and water.  You may rinse your mouth with mouth wash if you wish.  Do not swallow any toothpaste or mouthwash.   Do not wear jewelry, make-up, hairpins, clips or nail polish.  Do not wear lotions, powders, or perfumes. You may wear deodorant.  Do not shave 48 hours prior to surgery. Men may shave face and neck.  Do not bring valuables to the hospital.     [facility] is not responsible for any belongings or valuables.               Contacts, dentures or bridgework may not be worn into surgery.  Leave your suitcase in the car. After surgery it may be brought to your room.  For patients admitted to the hospital, discharge time is determined by your                       treatment team.  _  Patients discharged the day of surgery will not be allowed to drive home.  You will need someone to drive you home and stay with you the night of your procedure.    Please read over the following fact sheets that you were given:   [facility] Preparing for Surgery and or MRSA Information   _x___ Take anti-hypertensive listed below, cardiac, seizure, asthma,     anti-reflux and psychiatric medicines. These include:  1. albuterol (PROVENTIL HFA;VENTOLIN HFA) 108 (90 Base) MCG/ACT inhaler bring to hospital  2.levothyroxine (SYNTHROID, LEVOTHROID) 88 MCG tablet  3.metoprolol tartrate (LOPRESSOR) 25 MG tablet  4.umeclidinium-vilanterol (ANORO ELLIPTA) 62.5-25 MCG/INH AEPB  5.  6.  ____Fleets enema or Magnesium Citrate as directed.   _x___ Use CHG Soap  or sage wipes as directed on instruction sheet   _x___ Use inhalers on the day of surgery and bring to hospital day of surgery  __x__ Stop Metformin and Janumet 2 days prior to surgery.    ____ Take 1/2 of usual insulin dose the night before surgery and none on the morning     surgery.   _x___ Follow recommendations from Cardiologist, Pulmonologist or PCP regarding          stopping Aspirin, Coumadin, Plavix ,Eliquis, Effient, or Pradaxa, and Pletal.  X____Stop Anti-inflammatories such as Advil, Aleve, Ibuprofen, Motrin, Naproxen, Naprosyn, Goodies powders or aspirin products. OK to take Tylenol and                          Celebrex.   _x___ Stop supplements until after surgery.  But may continue Vitamin D, Vitamin B,       and multivitamin.   ____ Bring C-Pap to the hospital.

## 2018-06-10 ENCOUNTER — Encounter: Payer: Self-pay | Admitting: Obstetrics and Gynecology

## 2018-06-10 NOTE — H&P (Signed)
GYNECOLOGY PREOPERATIVE HISTORY AND PHYSICAL   Subjective:  Alexis Garcia is a 57 y.o. [id number] here for surgical management of abnormal perimenopausal bleeding. Patient also with a h/o fibroids. Significant preoperative concerns: H/o COPD  Proposed surgery: Hysteroscopy D&C (with possible myomectomy), endometrial ablation (Minerva)    Pertinent Gynecological History: Menses: flow is moderate and cycles are irregular, bleeding for 2-3 weeks at a time, stopping sometimes for 2-3 days and then resuming Bleeding: abnormal uterine bleeding Contraception: abstinence Last mammogram: normal Date: 02/2018 Last pap: normal Date: 09/2017   Past Medical History:  Diagnosis Date  . Abnormal uterine bleeding   . Asthma   . Cardiomegaly   . Cardiovascular disease   . COPD (chronic obstructive pulmonary disease) ([place])   . Diabetes mellitus without complication ([place])   . Goiter, nontoxic, multinodular   . Hypertension   . Hypothyroidism   . Thyrotoxicosis    Past Surgical History:  Procedure Laterality Date  . CESAREAN SECTION    . COLONOSCOPY WITH PROPOFOL N/A 10/29/2015   Procedure: COLONOSCOPY WITH PROPOFOL;  Surgeon: Josefine Class, MD;  Location: [facility] ENDOSCOPY;  Service: Endoscopy;  Laterality: N/A;   OB History  Gravida Para Term Preterm AB Living  4 4 2 1       SAB TAB Ectopic Multiple Live Births               # Outcome Date GA Lbr Len/2nd Weight Sex Delivery Anes PTL Lv  4 Term 40    F Vag-Spont     3 Term 86    M CS-Unspec     2 Para 1985             Birth Comments: miscarriage  1 Preterm 32    F Vag-Spont       Family History  Adopted: Yes    Social History   Socioeconomic History  . Marital status: Divorced    Spouse name: Not on file  . Number of children: Not on file  . Years of education: Not on file  . Highest education level: Not on file  Occupational History  . Not on file  Social Needs  . Financial resource strain: Not on file  . Food  insecurity:    Worry: Not on file    Inability: Not on file  . Transportation needs:    Medical: Not on file    Non-medical: Not on file  Tobacco Use  . Smoking status: Current Every Day Smoker    Packs/day: 0.50  . Smokeless tobacco: Never Used  Substance and Sexual Activity  . Alcohol use: Never    Frequency: Never  . Drug use: Never  . Sexual activity: Yes    Birth control/protection: None, Post-menopausal  Lifestyle  . Physical activity:    Days per week: Not on file    Minutes per session: Not on file  . Stress: Not on file  Relationships  . Social connections:    Talks on phone: Not on file    Gets together: Not on file    Attends religious service: Not on file    Active member of club or organization: Not on file    Attends meetings of clubs or organizations: Not on file    Relationship status: Not on file  . Intimate partner violence:    Fear of current or ex partner: Not on file    Emotionally abused: Not on file    Physically abused: Not on file  Forced sexual activity: Not on file  Other Topics Concern  . Not on file  Social History Narrative  . Not on file    Current Outpatient Medications on File Prior to Visit  Medication Sig Dispense Refill  . albuterol (PROVENTIL HFA;VENTOLIN HFA) 108 (90 Base) MCG/ACT inhaler Inhale 2 puffs into the lungs every 6 (six) hours as needed (for exercise induced asthma).    . cholecalciferol (VITAMIN D) 400 units TABS tablet Take 400 Units by mouth daily.    . hydrochlorothiazide (HYDRODIURIL) 25 MG tablet Take 25 mg by mouth daily.    Marland Kitchen levothyroxine (SYNTHROID, LEVOTHROID) 88 MCG tablet Take 88 mcg by mouth daily before breakfast.    . lisinopril (PRINIVIL,ZESTRIL) 2.5 MG tablet Take 2.5 mg by mouth every evening.     . metFORMIN (GLUCOPHAGE) 500 MG tablet Take 500 mg by mouth 2 (two) times daily.     . metoprolol tartrate (LOPRESSOR) 25 MG tablet Take 25 mg by mouth 2 (two) times daily.    . Multiple Vitamins-Minerals  (ALIVE ONCE DAILY WOMENS PO) Take 2 tablets by mouth daily. Gummies    . norethindrone (AYGESTIN) 5 MG tablet Take 1 tablet (5 mg total) by mouth daily. (Patient taking differently: Take 5 mg by mouth every evening. ) 30 tablet 1  . Omega-3 Fatty Acids (FISH OIL) 1000 MG CAPS Take 1,000 mg by mouth daily.    . phentermine (ADIPEX-P) 37.5 MG tablet Take 37.5 mg by mouth daily before breakfast.     No current facility-administered medications on file prior to visit.    No Known Allergies    Review of Systems Constitutional: No recent fever/chills/sweats Respiratory: No recent cough/bronchitis Cardiovascular: No chest pain Gastrointestinal: No recent nausea/vomiting/diarrhea Genitourinary: No UTI symptoms Hematologic/lymphatic:No history of coagulopathy or recent blood thinner use    Objective:   Blood pressure 138/77, pulse 74, height 5\' 6"  (1.676 m), weight 237 lb 3.2 oz (107.6 kg). CONSTITUTIONAL: Well-developed, well-nourished female in no acute distress.  HENT:  Normocephalic, atraumatic, External right and left ear normal. Oropharynx is clear and moist EYES: Conjunctivae and EOM are normal. Pupils are equal, round, and reactive to light. No scleral icterus.  NECK: Normal range of motion, supple, no masses SKIN: Skin is warm and dry. No rash noted. Not diaphoretic. No erythema. No pallor. NEUROLOGIC: Alert and oriented to person, place, and time. Normal reflexes, muscle tone coordination. No cranial nerve deficit noted. PSYCHIATRIC: Normal mood and affect. Normal behavior. Normal judgment and thought content. CARDIOVASCULAR: Normal heart rate noted, regular rhythm RESPIRATORY: Effort and breath sounds normal, no problems with respiration noted ABDOMEN: Soft, nontender, nondistended. PELVIC: Deferred MUSCULOSKELETAL: Normal range of motion. No edema and no tenderness. 2+ distal pulses.    Labs: Results for Alexis Garcia, Alexis Garcia (MRN [id number]) as of 06/10/2018 17:45  Ref. Range 02/09/2018  17:21  WBC Latest Ref Range: 3.4 - 10.8 x10E3/uL 5.6  RBC Latest Ref Range: 3.77 - 5.28 x10E6/uL 4.07  Hemoglobin Latest Ref Range: 11.1 - 15.9 g/dL 11.6  HCT Latest Ref Range: 34.0 - 46.6 % 34.9  MCV Latest Ref Range: 79 - 97 fL 86  MCH Latest Ref Range: 26.6 - 33.0 pg 28.5  MCHC Latest Ref Range: 31.5 - 35.7 g/dL 33.2  RDW Latest Ref Range: 12.3 - 15.4 % 14.9  Platelets Latest Ref Range: 150 - 379 x10E3/uL 324     Pathology:  Diagnosis:  CERVIX, BIOPSY:  BENIGN CERVICAL TISSUE WITH SUBMUCOSAL LEIOMYOMA. NEGATIVE FOR DYSPLASIA,  VIRAL EFFECT,  AND MALIGNANCY.    Imaging (External Pelvic Ultrasound performed 01/05/2018):  1. Uterus measures 8.0 x 5.71 x 4.25 cm.  Two fibroids noted.  One in the posterior fundus measures 2.34 x 2.6 cm with calcification.  A second fibroid is in the lower uterine segment measuring 2.2 x 2.2 cm. 2.  Endometrium is not identified 3.  Ovaries are normal.  Right ovary measures 3.44 x 2.56 cm the.  The left ovary measures 3.24 x 2.69 cm.  4.  Cul-de-sac is negative for abnormal fluid    Assessment:    Abnormal perimenopausal bleeding Fibroid uterus    Plan:    Counseling: Procedure, risks, reasons, benefits and complications (including injury to bowel, bladder, major blood vessel, ureter, bleeding, possibility of transfusion, infection, or fistula formation) reviewed in detail. Likelihood of success in alleviating the patient's condition was discussed. Routine postoperative instructions will be reviewed with the patient and her family in detail after surgery.  The patient concurred with the proposed plan, giving informed written consent for the surgery.   Preop testing ordered. Instructions reviewed, including NPO after midnight.     Rubie Maid, MD [facility]

## 2018-06-25 ENCOUNTER — Ambulatory Visit
Admission: RE | Admit: 2018-06-25 | Discharge: 2018-06-25 | Disposition: A | Discharge status: Home / Self Care | Payer: BLUE CROSS/BLUE SHIELD | Source: Ambulatory Visit | Attending: Obstetrics and Gynecology | Admitting: Obstetrics and Gynecology

## 2018-06-25 ENCOUNTER — Ambulatory Visit: Payer: BLUE CROSS/BLUE SHIELD | Admitting: Registered Nurse

## 2018-06-25 ENCOUNTER — Encounter: Payer: Self-pay | Admitting: *Deleted

## 2018-06-25 ENCOUNTER — Other Ambulatory Visit: Payer: Self-pay

## 2018-06-25 ENCOUNTER — Encounter
Admission: RE | Disposition: A | Discharge status: Home / Self Care | Payer: Self-pay | Source: Ambulatory Visit | Attending: Obstetrics and Gynecology

## 2018-06-25 DIAGNOSIS — Z79899 Other long term (current) drug therapy: Secondary | ICD-10-CM | POA: Diagnosis not present

## 2018-06-25 DIAGNOSIS — F17200 Nicotine dependence, unspecified, uncomplicated: Secondary | ICD-10-CM | POA: Diagnosis not present

## 2018-06-25 DIAGNOSIS — D259 Leiomyoma of uterus, unspecified: Secondary | ICD-10-CM | POA: Insufficient documentation

## 2018-06-25 DIAGNOSIS — Z7984 Long term (current) use of oral hypoglycemic drugs: Secondary | ICD-10-CM | POA: Insufficient documentation

## 2018-06-25 DIAGNOSIS — E119 Type 2 diabetes mellitus without complications: Secondary | ICD-10-CM | POA: Insufficient documentation

## 2018-06-25 DIAGNOSIS — D251 Intramural leiomyoma of uterus: Secondary | ICD-10-CM

## 2018-06-25 DIAGNOSIS — N924 Excessive bleeding in the premenopausal period: Secondary | ICD-10-CM | POA: Diagnosis not present

## 2018-06-25 DIAGNOSIS — E039 Hypothyroidism, unspecified: Secondary | ICD-10-CM | POA: Insufficient documentation

## 2018-06-25 DIAGNOSIS — I10 Essential (primary) hypertension: Secondary | ICD-10-CM | POA: Diagnosis not present

## 2018-06-25 DIAGNOSIS — J449 Chronic obstructive pulmonary disease, unspecified: Secondary | ICD-10-CM | POA: Diagnosis not present

## 2018-06-25 DIAGNOSIS — N711 Chronic inflammatory disease of uterus: Secondary | ICD-10-CM | POA: Insufficient documentation

## 2018-06-25 DIAGNOSIS — N938 Other specified abnormal uterine and vaginal bleeding: Secondary | ICD-10-CM

## 2018-06-25 HISTORY — PX: DILITATION & CURRETTAGE/HYSTROSCOPY WITH NOVASURE ABLATION: SHX5568

## 2018-06-25 LAB — POCT PREGNANCY, URINE: Preg Test, Ur: NEGATIVE

## 2018-06-25 LAB — GLUCOSE, CAPILLARY
GLUCOSE-CAPILLARY: 98 mg/dL (ref 70–99)
GLUCOSE-CAPILLARY: 87 mg/dL (ref 70–99)

## 2018-06-25 SURGERY — DILATATION & CURETTAGE/HYSTEROSCOPY WITH NOVASURE ABLATION
Anesthesia: General | Site: Vagina | Wound class: Clean Contaminated

## 2018-06-25 MED ORDER — GLYCOPYRROLATE 0.2 MG/ML IJ SOLN
INTRAMUSCULAR | Status: DC | PRN
  Administered 2018-06-25: 0.2 mg via INTRAVENOUS

## 2018-06-25 MED ORDER — ACETAMINOPHEN 325 MG PO TABS: 650.0000 mg | ORAL_TABLET | ORAL | 1 refills | Status: AC | PRN

## 2018-06-25 MED ORDER — OXYCODONE HCL 5 MG/5ML PO SOLN: 5.0000 mg | Freq: Once | ORAL | Status: DC | PRN

## 2018-06-25 MED ORDER — MIDAZOLAM HCL 2 MG/2ML IJ SOLN
INTRAMUSCULAR | Status: DC | PRN
  Administered 2018-06-25: 2 mg via INTRAVENOUS

## 2018-06-25 MED ORDER — LIDOCAINE HCL (CARDIAC) PF 100 MG/5ML IV SOSY
PREFILLED_SYRINGE | INTRAVENOUS | Status: DC | PRN
  Administered 2018-06-25: 100 mg via INTRAVENOUS

## 2018-06-25 MED ORDER — FENTANYL CITRATE (PF) 100 MCG/2ML IJ SOLN
INTRAMUSCULAR | Status: AC
  Filled 2018-06-25: qty 2

## 2018-06-25 MED ORDER — LACTATED RINGERS IV SOLN: INTRAVENOUS | Status: DC

## 2018-06-25 MED ORDER — LIDOCAINE HCL (PF) 2 % IJ SOLN
INTRAMUSCULAR | Status: AC
  Filled 2018-06-25: qty 10

## 2018-06-25 MED ORDER — FENTANYL CITRATE (PF) 100 MCG/2ML IJ SOLN
25.0000 ug | INTRAMUSCULAR | Status: DC | PRN
  Administered 2018-06-25 (×2): 50 ug via INTRAVENOUS

## 2018-06-25 MED ORDER — FAMOTIDINE 20 MG PO TABS
20.0000 mg | ORAL_TABLET | Freq: Once | ORAL | Status: AC
  Administered 2018-06-25: 20 mg via ORAL

## 2018-06-25 MED ORDER — FENTANYL CITRATE (PF) 100 MCG/2ML IJ SOLN
INTRAMUSCULAR | Status: DC | PRN
  Administered 2018-06-25: 50 ug via INTRAVENOUS

## 2018-06-25 MED ORDER — DEXAMETHASONE SODIUM PHOSPHATE 10 MG/ML IJ SOLN
INTRAMUSCULAR | Status: AC
  Filled 2018-06-25: qty 1

## 2018-06-25 MED ORDER — FAMOTIDINE 20 MG PO TABS
ORAL_TABLET | ORAL | Status: AC
  Administered 2018-06-25: 20 mg via ORAL
  Filled 2018-06-25: qty 1

## 2018-06-25 MED ORDER — PHENYLEPHRINE HCL 10 MG/ML IJ SOLN
INTRAMUSCULAR | Status: DC | PRN
  Administered 2018-06-25: 200 ug via INTRAVENOUS

## 2018-06-25 MED ORDER — IBUPROFEN 800 MG PO TABS: 800.0000 mg | ORAL_TABLET | Freq: Three times a day (TID) | ORAL | 1 refills | Status: DC | PRN

## 2018-06-25 MED ORDER — SODIUM CHLORIDE 0.9 % IV SOLN
INTRAVENOUS | Status: DC
  Administered 2018-06-25: 12:00:00 via INTRAVENOUS

## 2018-06-25 MED ORDER — ONDANSETRON HCL 4 MG/2ML IJ SOLN
INTRAMUSCULAR | Status: DC | PRN
  Administered 2018-06-25: 4 mg via INTRAVENOUS

## 2018-06-25 MED ORDER — PROPOFOL 10 MG/ML IV BOLUS
INTRAVENOUS | Status: AC
  Filled 2018-06-25: qty 20

## 2018-06-25 MED ORDER — ONDANSETRON HCL 4 MG/2ML IJ SOLN
INTRAMUSCULAR | Status: AC
  Filled 2018-06-25: qty 2

## 2018-06-25 MED ORDER — PROPOFOL 10 MG/ML IV BOLUS
INTRAVENOUS | Status: DC | PRN
  Administered 2018-06-25: 150 mg via INTRAVENOUS

## 2018-06-25 MED ORDER — OXYCODONE HCL 5 MG PO TABS: 5.0000 mg | ORAL_TABLET | Freq: Once | ORAL | Status: DC | PRN

## 2018-06-25 MED ORDER — PROMETHAZINE HCL 25 MG/ML IJ SOLN: 6.2500 mg | INTRAMUSCULAR | Status: DC | PRN

## 2018-06-25 MED ORDER — MEPERIDINE HCL 50 MG/ML IJ SOLN: 6.2500 mg | INTRAMUSCULAR | Status: DC | PRN

## 2018-06-25 MED ORDER — GLYCOPYRROLATE 0.2 MG/ML IJ SOLN
INTRAMUSCULAR | Status: AC
  Filled 2018-06-25: qty 1

## 2018-06-25 MED ORDER — DEXAMETHASONE SODIUM PHOSPHATE 10 MG/ML IJ SOLN
INTRAMUSCULAR | Status: DC | PRN
  Administered 2018-06-25: 10 mg via INTRAVENOUS

## 2018-06-25 MED ORDER — MIDAZOLAM HCL 2 MG/2ML IJ SOLN
INTRAMUSCULAR | Status: AC
  Filled 2018-06-25: qty 2

## 2018-06-25 SURGICAL SUPPLY — 25 items
ABLATOR SURESOUND NOVASURE (ABLATOR) ×3 IMPLANT
CANISTER SUCT 1200ML W/VALVE (MISCELLANEOUS) ×3 IMPLANT
CATH ROBINSON RED A/P 16FR (CATHETERS) ×3 IMPLANT
DRAPE LEGGINS SURG 28X43 STRL (DRAPES) ×3 IMPLANT
DRAPE SHEET LG 3/4 BI-LAMINATE (DRAPES) ×3 IMPLANT
DRSG TELFA 3X8 NADH (GAUZE/BANDAGES/DRESSINGS) ×3 IMPLANT
ELECT REM PT RETURN 9FT ADLT (ELECTROSURGICAL) ×3
ELECTRODE REM PT RTRN 9FT ADLT (ELECTROSURGICAL) ×1 IMPLANT
GLOVE BIO SURGEON STRL SZ 6.5 (GLOVE) ×2 IMPLANT
GLOVE BIO SURGEONS STRL SZ 6.5 (GLOVE) ×1
GLOVE INDICATOR 7.0 STRL GRN (GLOVE) ×3 IMPLANT
GOWN STRL REUS W/ TWL LRG LVL3 (GOWN DISPOSABLE) ×1 IMPLANT
GOWN STRL REUS W/TWL LRG LVL3 (GOWN DISPOSABLE) ×2
HANDPIECE ABLA MINERVA ENDO (MISCELLANEOUS) IMPLANT
IV LACTATED RINGERS 1000ML (IV SOLUTION) ×3 IMPLANT
KIT TURNOVER CYSTO (KITS) ×3 IMPLANT
LABEL OR SOLS (LABEL) ×3 IMPLANT
NS IRRIG 500ML POUR BTL (IV SOLUTION) ×3 IMPLANT
PACK DNC HYST (MISCELLANEOUS) ×3 IMPLANT
PAD OB MATERNITY 4.3X12.25 (PERSONAL CARE ITEMS) ×3 IMPLANT
PAD PREP 24X41 OB/GYN DISP (PERSONAL CARE ITEMS) ×3 IMPLANT
SURGILUBE 2OZ TUBE FLIPTOP (MISCELLANEOUS) ×3 IMPLANT
TOWEL OR 17X26 4PK STRL BLUE (TOWEL DISPOSABLE) ×3 IMPLANT
TUBING CONNECTING 10 (TUBING) ×2 IMPLANT
TUBING CONNECTING 10' (TUBING) ×1

## 2018-06-25 NOTE — Anesthesia Postprocedure Evaluation (Signed)
Anesthesia Post Note  Patient: Alexis Garcia  Procedure(s) Performed: DILATATION & CURETTAGE/HYSTEROSCOPY WITH MINERVA ABLATION (N/A Vagina )  Patient location during evaluation: PACU Anesthesia Type: General Level of consciousness: awake and alert and oriented Pain management: pain level controlled Vital Signs Assessment: post-procedure vital signs reviewed and stable Respiratory status: spontaneous breathing, nonlabored ventilation and respiratory function stable Cardiovascular status: blood pressure returned to baseline and stable Postop Assessment: no signs of nausea or vomiting Anesthetic complications: no     Last Vitals:  Vitals:   06/25/18 1500 06/25/18 1513  BP: 139/77 125/81  Pulse: 61 (!) 59  Resp: 16   Temp:    SpO2: 96% 100%    Last Pain:  Vitals:   06/25/18 1500  TempSrc:   PainSc: 3                  Elysse Polidore

## 2018-06-25 NOTE — Anesthesia Procedure Notes (Signed)
Procedure Name: LMA Insertion Date/Time: 06/25/2018 12:58 PM Performed by: Doreen Salvage, CRNA Pre-anesthesia Checklist: Patient identified, Patient being monitored, Timeout performed, Emergency Drugs available and Suction available Patient Re-evaluated:Patient Re-evaluated prior to induction Oxygen Delivery Method: Circle system utilized Preoxygenation: Pre-oxygenation with 100% oxygen Induction Type: IV induction Ventilation: Mask ventilation without difficulty LMA: LMA inserted LMA Size: 5.0 Tube type: Oral Number of attempts: 1 Placement Confirmation: positive ETCO2 and breath sounds checked- equal and bilateral Tube secured with: Tape Dental Injury: Teeth and Oropharynx as per pre-operative assessment

## 2018-06-25 NOTE — Transfer of Care (Signed)
Immediate Anesthesia Transfer of Care Note  Patient: Alexis Garcia  Procedure(s) Performed: Procedure(s): DILATATION & CURETTAGE/HYSTEROSCOPY WITH MINERVA ABLATION (N/A)  Patient Location: PACU  Anesthesia Type:General  Level of Consciousness: sedated  Airway & Oxygen Therapy: Patient Spontanous Breathing and Patient connected to face mask oxygen  Post-op Assessment: Report given to RN and Post -op Vital signs reviewed and stable  Post vital signs: Reviewed and stable  Last Vitals:  Vitals:   06/25/18 1142 06/25/18 1405  BP: (!) 152/87 126/60  Pulse: (!) 54 (!) 56  Resp: 18 16  Temp: 36.9 C 36.6 C  SpO2: 476% 546%    Complications: No apparent anesthesia complications

## 2018-06-25 NOTE — OR Nursing (Signed)
Discharge instructions discussed with pt and daughter. Both voice understanding. 

## 2018-06-25 NOTE — Anesthesia Preprocedure Evaluation (Signed)
Anesthesia Evaluation  Patient identified by MRN, date of birth, ID band Patient awake    Reviewed: Allergy & Precautions, NPO status , Patient's Chart, lab work & pertinent test results  History of Anesthesia Complications Negative for: history of anesthetic complications  Airway Mallampati: III  TM Distance: >3 FB Neck ROM: Full    Dental no notable dental hx.    Pulmonary asthma , COPD,  COPD inhaler, Current Smoker,    breath sounds clear to auscultation- rhonchi (-) wheezing      Cardiovascular Exercise Tolerance: Good hypertension, Pt. on medications (-) CAD, (-) Past MI, (-) Cardiac Stents and (-) CABG  Rhythm:Regular Rate:Normal - Systolic murmurs and - Diastolic murmurs    Neuro/Psych negative neurological ROS  negative psych ROS   GI/Hepatic negative GI ROS, Neg liver ROS,   Endo/Other  diabetes, Oral Hypoglycemic AgentsHypothyroidism   Renal/GU negative Renal ROS     Musculoskeletal negative musculoskeletal ROS (+)   Abdominal (+) + obese,   Peds  Hematology negative hematology ROS (+)   Anesthesia Other Findings Past Medical History: No date: Abnormal uterine bleeding No date: Asthma No date: Cardiomegaly No date: Cardiovascular disease No date: COPD (chronic obstructive pulmonary disease) ([facility]) No date: Diabetes mellitus without complication ([facility]) 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, nontoxic, multinodular No date: Hypertension No date: Hypothyroidism No date: Thyrotoxicosis   Reproductive/Obstetrics                             Anesthesia Physical Anesthesia Plan  ASA: II  Anesthesia Plan: General   Post-op Pain Management:    Induction: Intravenous  PONV Risk Score and Plan: 1 and Ondansetron and Midazolam  Airway Management Planned: LMA  Additional Equipment:   Intra-op Plan:   Post-operative Plan:   Informed Consent: I have reviewed the patients History and Physical,  chart, labs and discussed the procedure including the risks, benefits and alternatives for the proposed anesthesia with the patient or authorized representative who has indicated his/her understanding and acceptance.   Dental advisory given  Plan Discussed with: CRNA and Anesthesiologist  Anesthesia Plan Comments:         Anesthesia Quick Evaluation

## 2018-06-25 NOTE — Discharge Instructions (Signed)
General Gynecological Post-Operative Instructions You may expect to feel dizzy, weak, and drowsy for as long as 24 hours after receiving the medicine that made you sleep (anesthetic).  Do not drive a car, ride a bicycle, participate in physical activities, or take public transportation until you are done taking narcotic pain medicines or as directed by your doctor.  Do not drink alcohol or take tranquilizers.  Do not take medicine that has not been prescribed by your doctor.  Do not sign important papers or make important decisions while on narcotic pain medicines.  Have a responsible person with you.  CARE OF INCISION/OPERATIVE SITE Keep incision clean and dry, if applicable. Take showers instead of baths until your doctor gives you permission to take baths.  Avoid heavy lifting (more than 10 pounds/4.5 kilograms), pushing, or pulling.  Avoid activities that may risk injury to your surgical site.  No sexual intercourse or placement of anything in the vagina for 2 weeks or as instructed by your doctor. If you have tubes coming from the wound site, check with your doctor regarding appropriate care of the tubes. Only take prescription or over-the-counter medicines  for pain, discomfort, or fever as directed by your doctor. Do not take aspirin. It can make you bleed. Take medicines (antibiotics) that kill germs if they are prescribed for you.  Call the office or go to the Emergency Room if:  You feel sick to your stomach (nauseous).  You start to throw up (vomit).  You have trouble eating or drinking.  You have an oral temperature above 101.  You have constipation that is not helped by adjusting diet or increasing fluid intake. Pain medicines are a common cause of constipation.  You have any other concerns. SEEK IMMEDIATE MEDICAL CARE IF:  You have persistent dizziness.  You have difficulty breathing or a congested sounding (croupy) cough.  You have an oral temperature above 102.5, not  controlled by medicine.  There is increasing pain or tenderness near or in the surgical site.    AMBULATORY SURGERY  DISCHARGE INSTRUCTIONS   1) The drugs that you were given will stay in your system until tomorrow so for the next 24 hours you should not:  A) Drive an automobile B) Make any legal decisions C) Drink any alcoholic beverage   2) You may resume regular meals tomorrow.  Today it is better to start with liquids and gradually work up to solid foods.  You may eat anything you prefer, but it is better to start with liquids, then soup and crackers, and gradually work up to solid foods.   3) Please notify your doctor immediately if you have any unusual bleeding, trouble breathing, redness and pain at the surgery site, drainage, fever, or pain not relieved by medication.    4) Additional Instructions:        Please contact your physician with any problems or Same Day Surgery at [phone], Monday through Friday 6 am to 4 pm, or [facility] at [facility] number at [phone].

## 2018-06-25 NOTE — Interval H&P Note (Signed)
History and Physical Interval Note:  06/25/2018 12:48 PM  Alexis Garcia  has presented today for surgery, with the diagnosis of ABNORMAL PERIMENOPAUSAL BLEEDING, UTERINE LEIOMYOMA  The various methods of treatment have been discussed with the patient and family. After consideration of risks, benefits and other options for treatment, the patient has consented to  Procedure(s): DILATATION & CURETTAGE/HYSTEROSCOPY WITH MINERVA ABLATION (N/A) as a surgical intervention .  The patient's history has been reviewed, patient examined, no change in status, stable for surgery.  I have reviewed the patient's chart and labs.  Questions were answered to the patient's satisfaction.     Rubie Maid, MD [facility]

## 2018-06-25 NOTE — Anesthesia Post-op Follow-up Note (Signed)
Anesthesia QCDR form completed.        

## 2018-06-25 NOTE — Op Note (Addendum)
Hysteroscopy Dilation and Curettage/Endometrial Ablation Procedure Note  Indications: 57 y.o. [id number] female with perimenopausal abnormal bleeding, fibroid uterus  Pre-operative Diagnosis: Perimenopausal abnormal bleeding, fibroid uterus  Post-operative Diagnosis: Same  Surgeon: Surgeon(s) and Role:    * Rubie Maid, MD - Primary   Assistants: None  Procedure:     * DILATATION & CURETTAGE/HYSTEROSCOPY WITH NOVASURE ABLATION - Choice  Anesthesia: General endotracheal anesthesia  Findings: Uterus sounded to 10 cm.  Cervical length 3 cm.  Steep retroflexion of the uterus. Weakly proliferative endometrium. Anterior fibroid present at lower uterine segment (submucosal). Tubal ostia were visualized bilaterally.  No polyps present. Total ablation time 26 secs with initial ablation (of endocervical canal), 50 seconds with second ablation of entire endometrial canal.  Power of 125 W. Adequate charring of endometrial tissue and cervical canal post ablation.  No perforations noted.   Procedure Details: Patient was seen in the preoperative holding area where the consent was reviewed. Patient was consented for the following procedure: Hysteroscopy Dilation and Curettage with Minerva endometrial ablation. Pt was taken to the operating room # 2.    The patient was then placed under general anesthesia without difficulty.  She was then prepped and draped in the normal sterile fashion, and placed in the dorsal lithotomy position.  A time out was performed.  A straight catheterization was performed. A sterile speculum was inserted into vagina. A single-tooth tenaculum was used to grasp the anterior lip of the cervix. Cervical dilation was performed. A 5 mm hysteroscope was introduced into the uterus under direct visualization. The cavity was allowed to fill, and then the entire cavity was explored with the findings described above. The hysteroscope was removed, and a sharp curette was then passed into the  uterus and endometrial sampling was collected for pathology.   Next the Minerva instrument was primed per instructions. The instrument was then placed into the endometrial canal however a good seal could not be obtained after 2 attempts, and so the decision was made to change devices to a Novasure ablation.  The Novasure was then placed into the endometrial canal and activated.  Total ablation time was 26 seconds.  The NovaSure instrument was then removed from the uterine cavity.  The hysteroscope was then re-introduced for final survey, with only charring of the endocervical canal present. A second Novasure device was then utilized to complete the ablation of the endometrium.  Once this Novasure device was activated, ablation time was 50 seconds. The  hysteroscope was then re-introduced for final survey, and adequate charring of the endometrium was noted.  The hysteroscope was removed from the patient's uterine cavity. The tenaculum was removed and excellent hemostasis was noted. The speculum was removed from the vagina.   All instrument and sponge counts were correct at the end of the procedure x 2.  The patient tolerated the procedure well.  She was awakened and taken to the PACU in stable condition.    Estimated Blood Loss:  minimal      Drains: straight catheterization with 100 cc at start of procedure.          Total IV Fluids:  500 ml  Specimens:  Endometrial curettings         Implants: None         Complications:  None; patient tolerated the procedure well.         Disposition: PACU - hemodynamically stable.         Condition: Stable   Rubie Maid, MD [facility]

## 2018-06-26 ENCOUNTER — Encounter: Payer: Self-pay | Admitting: Obstetrics and Gynecology

## 2018-06-29 LAB — SURGICAL PATHOLOGY

## 2018-06-30 ENCOUNTER — Ambulatory Visit (INDEPENDENT_AMBULATORY_CARE_PROVIDER_SITE_OTHER): Payer: BLUE CROSS/BLUE SHIELD | Admitting: Obstetrics and Gynecology

## 2018-06-30 ENCOUNTER — Encounter: Payer: Self-pay | Admitting: Obstetrics and Gynecology

## 2018-06-30 VITALS — BP 144/84 | HR 61 | Ht 66.0 in | Wt 236.6 lb

## 2018-06-30 DIAGNOSIS — Z9889 Other specified postprocedural states: Secondary | ICD-10-CM

## 2018-06-30 NOTE — Progress Notes (Signed)
Pt is present today for post-op follow up. Pt stated that she is doing well.

## 2018-06-30 NOTE — Progress Notes (Signed)
    OBSTETRICS/GYNECOLOGY POST-OPERATIVE CLINIC VISIT  Subjective:     Alexis Garcia is a 57 y.o. female who presents to the clinic 1 weeks status post hysteroscopy D&C with endometrial ablation for abnormal uterine bleeding. Eating a regular diet without difficulty. Bowel movements are normal. The patient is not having any pain.  The following portions of the patient's history were reviewed and updated as appropriate: allergies, current medications, past family history, past medical history, past social history, past surgical history and problem list.  Review of Systems Pertinent items noted in HPI and remainder of comprehensive ROS otherwise negative.    Objective:    BP (!) 144/84   Pulse 61   Ht 5\' 6"  (1.676 m)   Wt 236 lb 9.6 oz (107.3 kg)   BMI 38.19 kg/m  General:  alert and no distress  Abdomen: soft, bowel sounds active, non-tender  Pelvis:   external genitalia normal, rectovaginal septum normal.  Vagina with scant brown discharge.  Cervix normal appearing, no lesions and no motion tenderness. Bimanual exam not performed.     Pathology:   DIAGNOSIS:  A. ENDOMETRIAL CURETTINGS:  - ENDOMETRIUM WITH CHRONIC ENDOMETRITIS, BREAKDOWN, AND CHANGES  CONSISTENT WITH PROGESTIN EFFECT.  - ENDOCERVICAL GLANDULAR EPITHELIUM WITH SQUAMOUS METAPLASIA.  - SQUAMOUS MUCOSA WITH ACUTE INFLAMMATION.  - NEGATIVE FOR ATYPIA AND MALIGNANCY.   Assessment:    Doing well postoperatively.  S/p hysteroscopy with endometrial ablation  Plan:   1. Continue any current medications as needed.  2. Wound care discussed. 3. Operative findings again reviewed. Pathology report discussed. 4. Activity restrictions: pelvic rest for an additional week.  5. Anticipated return to work: next week.. 6. Follow up: Return to clinic for any scheduled appointments or for any gynecologic concerns as needed.    Rubie Maid, MD [facility]

## 2018-09-24 ENCOUNTER — Encounter: Payer: Self-pay | Admitting: Adult Health

## 2018-09-24 ENCOUNTER — Ambulatory Visit: Payer: Self-pay | Admitting: Adult Health

## 2018-09-24 VITALS — BP 150/60 | HR 62 | Temp 98.3°F

## 2018-09-24 DIAGNOSIS — H539 Unspecified visual disturbance: Secondary | ICD-10-CM

## 2018-09-24 DIAGNOSIS — H5712 Ocular pain, left eye: Secondary | ICD-10-CM

## 2018-09-24 NOTE — Patient Instructions (Addendum)
To be seen at [facility] [place] - this clinic has arranged this NOW. Address given. We recommend having someone train you.   Visual Disturbances A visual disturbance is any problem that interferes with your normal vision. You can have a visual disturbance in one eye or both eyes. Some types of visual disturbances come and go without treatment and do not cause a permanent problem. Other visual disturbances may be a sign of a serious medical emergency. There are many signs and symptoms of a visual disturbance, including:  Blurred vision.  Inability to see certain colors.  Seeing floating spots (floaters).  Light sensitivity.  Flashing or shimmering lights.  Zigzagging lines or stars.  Seeing the floor as tilted (visual midline shift).  Being unaware of objects on one side of the body (visual spatial inattention).  Double vision.  Rhythmic, involuntary eye movements (nystagmus).  Hallucinations.  Temporary or permanent blindness.  Follow these instructions at home:  Take over-the-counter and prescription medicines only as told by your health care provider.  To lower your risk of the problems that can lead to visual disturbances: ? Eat a healthy diet. ? Maintain a healthy weight. Lose weight if you need to. ? Exercise regularly. Ask your health care provider what activities are safe for you.  Avoid migraine triggers when possible.  Keep all follow-up visits as told by your health care provider. This is important. Contact a health care provider if:  Your visual disturbance changes or becomes worse.  You notice any new visual disturbances. Get help right away if:  You lose most or all of your vision in one eye or both eyes.  You experience visual hallucinations.  You have chest pain, nausea, or vomiting along with visual disturbances. This information is not intended to replace advice given to you by your health care provider. Make sure you discuss any  questions you have with your health care provider. Document Released: 12/04/2004 Document Revised: 04/09/2016 Document Reviewed: 04/05/2014 Elsevier Interactive Patient Education  [employer].

## 2018-09-24 NOTE — Progress Notes (Addendum)
Subjective:     Patient ID: Alexis Garcia, female   DOB: October 16, 1961, 57 y.o.   MRN: [id number]   Blood pressure (!) 150/60, pulse 62, temperature 98.3 F (36.8 C), SpO2 98 %.  Eye Problem   The left eye is affected. This is a new problem. The current episode started 1 to 4 weeks ago (09/22/18). The problem has been gradually worsening. There was no injury mechanism (denies any trauma was rubbing eye thought maybe she had something in it but that feeling never went away ). The pain is at a severity of 6/10. The pain is moderate. There is no known exposure to pink eye. She does not wear contacts. Associated symptoms include blurred vision. Pertinent negatives include no eye discharge, double vision, eye redness, fever, foreign body sensation, itching, nausea, photophobia, recent URI or vomiting. Associated symptoms comments: Eye pain and " seeing purple/ black spots and shadows for the past week. " headache around right eye only per patient. . She has tried eye drops (over the counter wetting drops/ ) for the symptoms. The treatment provided no relief.    Denies pulsating or thunder clap headache.  She has been working the entire time with all of this and reports has been able to   Patient is a 57 year old female in no acute distress with complaints of eye pain left eye sees purple /black dot in eye since last Thursday. She did have stye in her right eye last week last Friday that she feels is still theres.  She reports she can only see shadows out of her left eye.   She reports before last week she could see fine. She states " I even tried to wear my reading glasses to see better but it did not help".   Denies any head pain, slurred speech, numbness or tingling. Denies black veil or curtain.   Denies any trauma or getting in to  Last eye exam - 5 years plus she reports.   Denies taking phentermine.   Review of Systems  Constitutional: Negative for activity change, appetite change, chills,  diaphoresis, fatigue, fever and unexpected weight change.  HENT: Negative.   Eyes: Positive for blurred vision, pain and visual disturbance. Negative for double vision, photophobia, discharge, redness and itching.  Respiratory: Negative.   Cardiovascular: Negative.   Gastrointestinal: Negative.  Negative for nausea and vomiting.  Endocrine: Negative.   Genitourinary: Negative.   Musculoskeletal: Negative.   Skin: Negative.  Negative for color change, pallor, rash and wound.  Allergic/Immunologic: Negative.   Neurological: Negative.   Hematological: Negative.   Psychiatric/Behavioral: Negative.        Objective:   Physical Exam  Constitutional: She is oriented to person, place, and time. Vital signs are normal. She appears well-developed and well-nourished. She is active. No distress. She is not intubated.  HENT:  Head: Normocephalic and atraumatic.  Right Ear: External ear normal.  Left Ear: External ear normal.  Nose: Nose normal.  Mouth/Throat: Oropharynx is clear and moist. No oropharyngeal exudate.  Eyes: Pupils are equal, round, and reactive to light. Conjunctivae, EOM and lids are normal. Lids are everted and swept, no foreign bodies found. Right eye exhibits no chemosis, no discharge and no exudate. Left eye exhibits no chemosis, no discharge and no exudate. Right conjunctiva is not injected. Right conjunctiva has no hemorrhage. Left conjunctiva is not injected. Left conjunctiva has no hemorrhage.     Miniscule size lump palpated left eyelid documented on diagram , no  abnormality visualized with eyelid roll or eye exam. No swelling, now warmth.  Unable to visualize any abnormality of the skin or eyelid.   No periorbital erythema or or pain.  No erythema, skin drainage, or temperature change. Unable to see any appreciable difference - conjunctiva appears normal.   No fluorecin exam done given urgency of eye symptoms- pain and vision changes.   Neck: Normal range of motion.  Neck supple.  Cardiovascular: Normal rate, regular rhythm, S1 normal and normal heart sounds.  Pulmonary/Chest: Effort normal and breath sounds normal. No accessory muscle usage. No apnea, no tachypnea and no bradypnea. She is not intubated. No respiratory distress.  Abdominal: Soft. Bowel sounds are normal.  Neurological: She is alert and oriented to person, place, and time. She has normal strength. She displays normal reflexes. No cranial nerve deficit or sensory deficit. She exhibits normal muscle tone. She displays a negative Romberg sign. Coordination normal. GCS eye subscore is 4. GCS verbal subscore is 5. GCS motor subscore is 6.  Able to smile, from symmetrical. Sticks out tongue and puffs cheeks simultaneously.   Speech clear and concise and no slurred speech.    Grip strip upper extremities 5/5    Lower extremities strength 5/5    Skin: Skin is warm and dry. Capillary refill takes less than 2 seconds. No rash noted. She is not diaphoretic. No erythema. No pallor.  Psychiatric: She has a normal mood and affect. Her behavior is normal. Judgment and thought content normal.  Vitals reviewed.  Nelda Severe RN reports patient was unable to see anything on snellen chart  Right eye  Left eye was 20/30  She is diabetic. She reports she keeps follow up with her PCP regularly.  Denies any previous eye exams or eye problems.  Peripheral vision normal bilateral eyes.     Assessment:     Acute pain in left eye  Vision change - seeing shadows purple gray in left eye        Plan:     10:25 am Nurse Nelda Severe RN called [facility] and spoke with  And advised patient could come immediately to the office. Patient was advised and advised not to self drive. She reported she could see fine driving with her right eye.   Follow up with [facility] and your primary care physician.   See primary care for blood pressure- keep readings. See within 1 week.  Provider  concerned with differential of retinal detachment or other serious eye complication given ACUTE RED FLAG SYMPTOMS.   Advised patient  your primary care doctor and keep eye appointment follow ups with [facility].  Advised ER or urgent Care if after hours or on weekend. Call 911 for emergency symptoms at any time.Patinet verbalized understanding of all instructions given/reviewed and treatment plan and has no further questions or concerns at this time.    Patient verbalized understanding of all instructions given and denies any further questions at this time.

## 2018-10-25 ENCOUNTER — Other Ambulatory Visit
Admission: RE | Admit: 2018-10-25 | Discharge: 2018-10-25 | Disposition: A | Discharge status: Home / Self Care | Payer: BLUE CROSS/BLUE SHIELD | Source: Ambulatory Visit | Attending: Ophthalmology | Admitting: Ophthalmology

## 2018-10-25 ENCOUNTER — Ambulatory Visit
Admission: RE | Admit: 2018-10-25 | Discharge: 2018-10-25 | Disposition: A | Discharge status: Home / Self Care | Payer: BLUE CROSS/BLUE SHIELD | Source: Ambulatory Visit | Attending: Ophthalmology | Admitting: Ophthalmology

## 2018-10-25 ENCOUNTER — Other Ambulatory Visit: Payer: Self-pay | Admitting: Ophthalmology

## 2018-10-25 DIAGNOSIS — H44112 Panuveitis, left eye: Secondary | ICD-10-CM | POA: Insufficient documentation

## 2018-10-25 DIAGNOSIS — D869 Sarcoidosis, unspecified: Secondary | ICD-10-CM

## 2018-10-25 LAB — CBC
HCT: 40.7 % (ref 36.0–46.0)
Hemoglobin: 13.5 g/dL (ref 12.0–15.0)
MCH: 29.7 pg (ref 26.0–34.0)
MCHC: 33.2 g/dL (ref 30.0–36.0)
MCV: 89.6 fL (ref 80.0–100.0)
PLATELETS: 244 10*3/uL (ref 150–400)
RBC: 4.54 MIL/uL (ref 3.87–5.11)
RDW: 14.1 % (ref 11.5–15.5)
WBC: 4.6 10*3/uL (ref 4.0–10.5)
nRBC: 0 % (ref 0.0–0.2)

## 2018-10-25 LAB — SEDIMENTATION RATE: SED RATE: 35 mm/h — AB (ref 0–30)

## 2018-10-26 LAB — MISC LABCORP TEST (SEND OUT): Labcorp test code: 163873

## 2018-10-26 LAB — TOXOPLASMA ANTIBODIES- IGG AND  IGM
Toxoplasma Antibody- IgM: <3 AU/mL (ref 0.0–7.9)
Toxoplasma IgG Ratio: <3 IU/mL (ref 0.0–7.1)

## 2018-10-26 LAB — RPR: RPR Ser Ql: NONREACTIVE

## 2018-10-26 LAB — ANGIOTENSIN CONVERTING ENZYME: Angiotensin-Converting Enzyme: 23 U/L (ref 14–82)

## 2018-11-01 LAB — QUANTIFERON-TB GOLD PLUS (RQFGPL)
QUANTIFERON NIL VALUE: 0.04 [IU]/mL
QUANTIFERON TB2 AG VALUE: 0.21 [IU]/mL
QuantiFERON Mitogen Value: >10 IU/mL
QuantiFERON TB1 Ag Value: 0.31 IU/mL

## 2018-11-01 LAB — QUANTIFERON-TB GOLD PLUS: QuantiFERON-TB Gold Plus: NEGATIVE

## 2019-04-29 ENCOUNTER — Encounter (INDEPENDENT_AMBULATORY_CARE_PROVIDER_SITE_OTHER): Payer: Self-pay | Admitting: Vascular Surgery

## 2019-05-05 ENCOUNTER — Other Ambulatory Visit: Payer: Self-pay

## 2019-05-05 DIAGNOSIS — E039 Hypothyroidism, unspecified: Secondary | ICD-10-CM

## 2019-05-06 LAB — T3: T3, Total: 104 ng/dL (ref 71–180)

## 2019-05-06 LAB — TSH: TSH: 2.81 u[IU]/mL (ref 0.450–4.500)

## 2019-05-06 LAB — T4, FREE: Free T4: 1.42 ng/dL (ref 0.82–1.77)

## 2019-05-17 ENCOUNTER — Ambulatory Visit (INDEPENDENT_AMBULATORY_CARE_PROVIDER_SITE_OTHER): Payer: BC Managed Care – PPO | Admitting: Vascular Surgery

## 2019-05-17 ENCOUNTER — Encounter (INDEPENDENT_AMBULATORY_CARE_PROVIDER_SITE_OTHER): Payer: Self-pay | Admitting: Vascular Surgery

## 2019-05-17 ENCOUNTER — Other Ambulatory Visit: Payer: Self-pay

## 2019-05-17 VITALS — BP 165/99 | HR 61 | Resp 16 | Ht 66.0 in | Wt 230.6 lb

## 2019-05-17 DIAGNOSIS — F17200 Nicotine dependence, unspecified, uncomplicated: Secondary | ICD-10-CM

## 2019-05-17 DIAGNOSIS — I10 Essential (primary) hypertension: Secondary | ICD-10-CM | POA: Diagnosis not present

## 2019-05-17 DIAGNOSIS — I6529 Occlusion and stenosis of unspecified carotid artery: Secondary | ICD-10-CM | POA: Insufficient documentation

## 2019-05-17 DIAGNOSIS — E119 Type 2 diabetes mellitus without complications: Secondary | ICD-10-CM | POA: Diagnosis not present

## 2019-05-17 DIAGNOSIS — I6521 Occlusion and stenosis of right carotid artery: Secondary | ICD-10-CM | POA: Diagnosis not present

## 2019-05-17 DIAGNOSIS — Z7984 Long term (current) use of oral hypoglycemic drugs: Secondary | ICD-10-CM

## 2019-05-17 DIAGNOSIS — N924 Excessive bleeding in the premenopausal period: Secondary | ICD-10-CM

## 2019-05-17 DIAGNOSIS — Z79899 Other long term (current) drug therapy: Secondary | ICD-10-CM

## 2019-05-17 NOTE — Progress Notes (Signed)
Patient ID: Alexis Garcia, female   DOB: Dec 17, 1960, 58 y.o.   MRN: [id number]  Chief Complaint  Patient presents with  . New Patient (Initial Visit)    ref lindley efor carotid stenosis    HPI Alexis Garcia is a 58 y.o. female.  I am asked to see the patient by C. Lavena Bullion, NP for evaluation of carotid stenosis.  The patient reports no obvious focal neurologic symptoms.  She admits to struggling with her cholesterol, diet, and some other medical issues over the past few months with COVID-19.  She saw her primary care physician a month or 2 ago at which time a carotid duplex was performed.  The carotid duplex suggests a right carotid artery occlusion with no significant left carotid artery stenosis.  Again, the patient is asymptomatic. Specifically, the patient denies amaurosis fugax, speech or swallowing difficulties, or arm or leg weakness or numbness    Past Medical History:  Diagnosis Date  . Abnormal uterine bleeding   . Asthma   . Cardiomegaly   . Cardiovascular disease   . COPD (chronic obstructive pulmonary disease) ([place])   . Diabetes mellitus without complication ([place])   . Goiter, nontoxic, multinodular   . Hypertension   . Hypothyroidism   . Thyrotoxicosis     Past Surgical History:  Procedure Laterality Date  . CESAREAN SECTION    . COLONOSCOPY WITH PROPOFOL N/A 10/29/2015   Procedure: COLONOSCOPY WITH PROPOFOL;  Surgeon: Josefine Class, MD;  Location: [facility] ENDOSCOPY;  Service: Endoscopy;  Laterality: N/A;  . DILITATION & CURRETTAGE/HYSTROSCOPY WITH NOVASURE ABLATION N/A 06/25/2018   Procedure: DILATATION & CURETTAGE/HYSTEROSCOPY WITH MINERVA ABLATION;  Surgeon: Rubie Maid, MD;  Location: [facility] ORS;  Service: Gynecology;  Laterality: N/A;    Family History  Adopted: Yes    Social History Social History   Tobacco Use  . Smoking status: Current Every Day Smoker    Packs/day: 0.50  . Smokeless tobacco: Never Used  Substance Use Topics  . Alcohol use:  Never    Frequency: Never  . Drug use: Never    No Known Allergies  Current Outpatient Medications  Medication Sig Dispense Refill  . albuterol (PROVENTIL HFA;VENTOLIN HFA) 108 (90 Base) MCG/ACT inhaler Inhale 2 puffs into the lungs every 6 (six) hours as needed (for exercise induced asthma).    . cholecalciferol (VITAMIN D) 400 units TABS tablet Take 400 Units by mouth daily.    Marland Kitchen ezetimibe (ZETIA) 10 MG tablet Take 10 mg by mouth daily.    . hydrochlorothiazide (HYDRODIURIL) 25 MG tablet Take 25 mg by mouth daily.    Marland Kitchen levothyroxine (SYNTHROID, LEVOTHROID) 88 MCG tablet Take 88 mcg by mouth daily before breakfast.    . lisinopril (PRINIVIL,ZESTRIL) 2.5 MG tablet Take 2.5 mg by mouth every evening.     . metFORMIN (GLUCOPHAGE) 500 MG tablet Take 500 mg by mouth 2 (two) times daily.     . metoprolol tartrate (LOPRESSOR) 25 MG tablet Take 25 mg by mouth 2 (two) times daily.    . Multiple Vitamins-Minerals (ALIVE ONCE DAILY WOMENS PO) Take 2 tablets by mouth daily. Gummies    . Omega-3 Fatty Acids (FISH OIL) 1000 MG CAPS Take 1,000 mg by mouth daily.    . phentermine (ADIPEX-P) 37.5 MG tablet Take 37.5 mg by mouth daily before breakfast.    . umeclidinium-vilanterol (ANORO ELLIPTA) 62.5-25 MCG/INH AEPB Inhale 1 puff into the lungs daily as needed.    Marland Kitchen acetaminophen (TYLENOL) 325 MG  tablet Take 2 tablets (650 mg total) by mouth every 4 (four) hours as needed. (Patient not taking: Reported on 05/17/2019) 60 tablet 1  . ibuprofen (ADVIL,MOTRIN) 800 MG tablet Take 1 tablet (800 mg total) by mouth every 8 (eight) hours as needed. (Patient not taking: Reported on 05/17/2019) 60 tablet 1   No current facility-administered medications for this visit.       REVIEW OF SYSTEMS (Negative unless checked)  Constitutional: [] Weight loss  [] Fever  [] Chills Cardiac: [] Chest pain   [] Chest pressure   [] Palpitations   [] Shortness of breath when laying flat   [] Shortness of breath at rest   [x] Shortness of  breath with exertion. Vascular:  [] Pain in legs with walking   [] Pain in legs at rest   [] Pain in legs when laying flat   [] Claudication   [] Pain in feet when walking  [] Pain in feet at rest  [] Pain in feet when laying flat   [] History of DVT   [] Phlebitis   [] Swelling in legs   [] Varicose veins   [] Non-healing ulcers Pulmonary:   [] Uses home oxygen   [] Productive cough   [] Hemoptysis   [] Wheeze  [x] COPD   [x] Asthma Neurologic:  [] Dizziness  [] Blackouts   [] Seizures   [] History of stroke   [] History of TIA  [] Aphasia   [] Temporary blindness   [] Dysphagia   [] Weakness or numbness in arms   [] Weakness or numbness in legs Musculoskeletal:  [] Arthritis   [] Joint swelling   [] Joint pain   [] Low back pain Hematologic:  [] Easy bruising  [] Easy bleeding   [] Hypercoagulable state   [] Anemic  [] Hepatitis Gastrointestinal:  [] Blood in stool   [] Vomiting blood  [x] Gastroesophageal reflux/heartburn   [] Abdominal pain Genitourinary:  [] Chronic kidney disease   [] Difficult urination  [] Frequent urination  [] Burning with urination   [] Hematuria Skin:  [] Rashes   [] Ulcers   [] Wounds Psychological:  [] History of anxiety   []  History of major depression.    Physical Exam BP (!) 165/99 (BP Location: Left Arm)   Pulse 61   Resp 16   Ht 5\' 6"  (1.676 m)   Wt 230 lb 9.6 oz (104.6 kg)   BMI 37.22 kg/m  Gen:  WD/WN, NAD Head: [place]/AT, No temporalis wasting.  Ear/Nose/Throat: Hearing grossly intact, nares w/o erythema or drainage, oropharynx w/o Erythema/Exudate Eyes: Conjunctiva clear, sclera non-icteric  Neck: trachea midline.  No bruit or JVD.  Pulmonary:  Good air movement, clear to auscultation bilaterally.  Cardiac: RRR, normal S1, S2 Vascular:  Vessel Right Left  Radial Palpable Palpable                                   Gastrointestinal: soft, non-tender/non-distended. Musculoskeletal: M/S 5/5 throughout.  Extremities without ischemic changes.  No deformity or atrophy. No LE edema. Neurologic:  Sensation grossly intact in extremities.  Symmetrical.  Speech is fluent. Motor exam as listed above. Psychiatric: Judgment intact, Mood & affect appropriate for pt's clinical situation. Dermatologic: No rashes or ulcers noted.  No cellulitis or open wounds.  Radiology No results found.  Labs Recent Results (from the past 2160 hour(s))  T4, free     Status: None   Collection Time: 05/05/19  8:17 AM  Result Value Ref Range   Free T4 1.42 0.82 - 1.77 ng/dL  TSH     Status: None   Collection Time: 05/05/19  8:17 AM  Result Value Ref Range   TSH 2.810  0.450 - 4.500 uIU/mL  T3     Status: None   Collection Time: 05/05/19  8:17 AM  Result Value Ref Range   T3, Total 104 71 - 180 ng/dL    Assessment/Plan:  Diabetes (HCC) blood glucose control important in reducing the progression of atherosclerotic disease. Also, involved in wound healing. On appropriate medications.   Essential hypertension blood pressure control important in reducing the progression of atherosclerotic disease. On appropriate oral medications.   Abnormal perimenopausal bleeding Had an ablation by GYN last year  Carotid stenosis The carotid duplex suggests a right carotid artery occlusion with no significant left carotid artery stenosis. The patient remains asymptomatic, and we discussed that she was fortunate that she did not have a stroke with this occlusion.  At this point, the patient would not require any surgical or interventional therapy as it would not be of benefit with a chronic total occlusion.  She should have surveillance follow-up to evaluate her carotid disease.  We discussed that her left carotid and her vertebrals are providing collateral circulation to the brain at this point.  Aspirin therapy would be of benefit and she says her doctor just put her on a cholesterol medicine which also is helpful.  Eating healthy, increasing her exercise, controlling her blood pressure and sugars are all of paramount  importance of avoidance of progression of disease.  I will plan to see her back in 3 to 4 months with a duplex for follow-up.      Leotis Pain 05/17/2019, 9:44 AM   This note was created with Dragon medical transcription system.  Any errors from dictation are unintentional.

## 2019-05-17 NOTE — Assessment & Plan Note (Signed)
The carotid duplex suggests a right carotid artery occlusion with no significant left carotid artery stenosis. The patient remains asymptomatic, and we discussed that she was fortunate that she did not have a stroke with this occlusion.  At this point, the patient would not require any surgical or interventional therapy as it would not be of benefit with a chronic total occlusion.  She should have surveillance follow-up to evaluate her carotid disease.  We discussed that her left carotid and her vertebrals are providing collateral circulation to the brain at this point.  Aspirin therapy would be of benefit and she says her doctor just put her on a cholesterol medicine which also is helpful.  Eating healthy, increasing her exercise, controlling her blood pressure and sugars are all of paramount importance of avoidance of progression of disease.  I will plan to see her back in 3 to 4 months with a duplex for follow-up.

## 2019-05-17 NOTE — Assessment & Plan Note (Signed)
Had an ablation by GYN last year

## 2019-05-17 NOTE — Assessment & Plan Note (Signed)
blood pressure control important in reducing the progression of atherosclerotic disease. On appropriate oral medications.  

## 2019-05-17 NOTE — Assessment & Plan Note (Signed)
blood glucose control important in reducing the progression of atherosclerotic disease. Also, involved in wound healing. On appropriate medications.  

## 2019-05-20 ENCOUNTER — Encounter (INDEPENDENT_AMBULATORY_CARE_PROVIDER_SITE_OTHER): Payer: Self-pay | Admitting: Vascular Surgery

## 2019-08-18 ENCOUNTER — Other Ambulatory Visit: Payer: BC Managed Care – PPO

## 2019-08-18 ENCOUNTER — Other Ambulatory Visit: Payer: Self-pay

## 2019-08-18 DIAGNOSIS — E049 Nontoxic goiter, unspecified: Secondary | ICD-10-CM

## 2019-08-19 LAB — T3, FREE: T3, Free: 2.5 pg/mL (ref 2.0–4.4)

## 2019-08-19 LAB — TSH: TSH: 2.74 u[IU]/mL (ref 0.450–4.500)

## 2019-08-19 LAB — T4, FREE: Free T4: 1.51 ng/dL (ref 0.82–1.77)

## 2019-09-20 ENCOUNTER — Encounter (INDEPENDENT_AMBULATORY_CARE_PROVIDER_SITE_OTHER): Payer: Self-pay | Admitting: Vascular Surgery

## 2019-09-20 ENCOUNTER — Encounter (INDEPENDENT_AMBULATORY_CARE_PROVIDER_SITE_OTHER): Payer: Self-pay

## 2019-09-20 ENCOUNTER — Ambulatory Visit (INDEPENDENT_AMBULATORY_CARE_PROVIDER_SITE_OTHER): Payer: BC Managed Care – PPO

## 2019-09-20 ENCOUNTER — Ambulatory Visit (INDEPENDENT_AMBULATORY_CARE_PROVIDER_SITE_OTHER): Payer: BC Managed Care – PPO | Admitting: Vascular Surgery

## 2019-09-20 ENCOUNTER — Other Ambulatory Visit: Payer: Self-pay

## 2019-09-20 VITALS — BP 153/87 | HR 62 | Resp 16 | Wt 229.8 lb

## 2019-09-20 DIAGNOSIS — E119 Type 2 diabetes mellitus without complications: Secondary | ICD-10-CM | POA: Diagnosis not present

## 2019-09-20 DIAGNOSIS — I6521 Occlusion and stenosis of right carotid artery: Secondary | ICD-10-CM

## 2019-09-20 DIAGNOSIS — N924 Excessive bleeding in the premenopausal period: Secondary | ICD-10-CM

## 2019-09-20 DIAGNOSIS — I10 Essential (primary) hypertension: Secondary | ICD-10-CM

## 2019-09-20 NOTE — Progress Notes (Signed)
MRN : [id number]  Alexis Garcia is a 58 y.o. (1961/03/24) female who presents with chief complaint of  Chief Complaint  Patient presents with  . Follow-up    ultrasound follow up  .  History of Present Illness: Patient returns in follow-up of her carotid arteries.  She is doing well with no complaints or problems since her last visit.  No focal neurologic symptoms. Specifically, the patient denies amaurosis fugax, speech or swallowing difficulties, or arm or leg weakness or numbness. Her carotid duplex today shows nearly normal carotid artery flow bilaterally with mild plaque and intimal thickening bilaterally.   Current Outpatient Medications  Medication Sig Dispense Refill  . albuterol (PROVENTIL HFA;VENTOLIN HFA) 108 (90 Base) MCG/ACT inhaler Inhale 2 puffs into the lungs every 6 (six) hours as needed (for exercise induced asthma).    . cholecalciferol (VITAMIN D) 400 units TABS tablet Take 400 Units by mouth daily.    Marland Kitchen ezetimibe (ZETIA) 10 MG tablet Take 10 mg by mouth daily.    . hydrochlorothiazide (HYDRODIURIL) 25 MG tablet Take 25 mg by mouth daily.    Marland Kitchen levothyroxine (SYNTHROID, LEVOTHROID) 88 MCG tablet Take 88 mcg by mouth daily before breakfast.    . lisinopril (PRINIVIL,ZESTRIL) 2.5 MG tablet Take 2.5 mg by mouth every evening.     . metFORMIN (GLUCOPHAGE) 500 MG tablet Take 500 mg by mouth 2 (two) times daily.     . metoprolol tartrate (LOPRESSOR) 25 MG tablet Take 25 mg by mouth 2 (two) times daily.    . Multiple Vitamins-Minerals (ALIVE ONCE DAILY WOMENS PO) Take 2 tablets by mouth daily. Gummies    . Omega-3 Fatty Acids (FISH OIL) 1000 MG CAPS Take 1,000 mg by mouth daily.    . phentermine (ADIPEX-P) 37.5 MG tablet Take 37.5 mg by mouth daily before breakfast.    . umeclidinium-vilanterol (ANORO ELLIPTA) 62.5-25 MCG/INH AEPB Inhale 1 puff into the lungs daily as needed.    Marland Kitchen ibuprofen (ADVIL,MOTRIN) 800 MG tablet Take 1 tablet (800 mg total) by mouth every 8 (eight)  hours as needed. (Patient not taking: Reported on 05/17/2019) 60 tablet 1   No current facility-administered medications for this visit.     Past Medical History:  Diagnosis Date  . Abnormal uterine bleeding   . Asthma   . Cardiomegaly   . Cardiovascular disease   . COPD (chronic obstructive pulmonary disease) ([place])   . Diabetes mellitus without complication ([place])   . Goiter, nontoxic, multinodular   . Hypertension   . Hypothyroidism   . Thyrotoxicosis     Past Surgical History:  Procedure Laterality Date  . CESAREAN SECTION    . COLONOSCOPY WITH PROPOFOL N/A 10/29/2015   Procedure: COLONOSCOPY WITH PROPOFOL;  Surgeon: Josefine Class, MD;  Location: [facility] ENDOSCOPY;  Service: Endoscopy;  Laterality: N/A;  . DILITATION & CURRETTAGE/HYSTROSCOPY WITH NOVASURE ABLATION N/A 06/25/2018   Procedure: DILATATION & CURETTAGE/HYSTEROSCOPY WITH MINERVA ABLATION;  Surgeon: Rubie Maid, MD;  Location: [facility] ORS;  Service: Gynecology;  Laterality: N/A;    Social History   Tobacco Use  . Smoking status: Current Every Day Smoker    Packs/day: 0.50  . Smokeless tobacco: Never Used  Substance Use Topics  . Alcohol use: Never    Frequency: Never  . Drug use: Never   Family History  Adopted: Yes     No Known Allergies   REVIEW OF SYSTEMS (Negative unless checked)  Constitutional: [] ?Weight loss  [] ?Fever  [] ?Chills Cardiac: [] ?Chest pain   [] ?  Chest pressure   [] ?Palpitations   [] ?Shortness of breath when laying flat   [] ?Shortness of breath at rest   [x] ?Shortness of breath with exertion. Vascular:  [] ?Pain in legs with walking   [] ?Pain in legs at rest   [] ?Pain in legs when laying flat   [] ?Claudication   [] ?Pain in feet when walking  [] ?Pain in feet at rest  [] ?Pain in feet when laying flat   [] ?History of DVT   [] ?Phlebitis   [] ?Swelling in legs   [] ?Varicose veins   [] ?Non-healing ulcers Pulmonary:   [] ?Uses home oxygen   [] ?Productive cough   [] ?Hemoptysis   [] ?Wheeze   [x] ?COPD   [x] ?Asthma Neurologic:  [] ?Dizziness  [] ?Blackouts   [] ?Seizures   [] ?History of stroke   [] ?History of TIA  [] ?Aphasia   [] ?Temporary blindness   [] ?Dysphagia   [] ?Weakness or numbness in arms   [] ?Weakness or numbness in legs Musculoskeletal:  [] ?Arthritis   [] ?Joint swelling   [] ?Joint pain   [] ?Low back pain Hematologic:  [] ?Easy bruising  [] ?Easy bleeding   [] ?Hypercoagulable state   [] ?Anemic  [] ?Hepatitis Gastrointestinal:  [] ?Blood in stool   [] ?Vomiting blood  [x] ?Gastroesophageal reflux/heartburn   [] ?Abdominal pain Genitourinary:  [] ?Chronic kidney disease   [] ?Difficult urination  [] ?Frequent urination  [] ?Burning with urination   [] ?Hematuria Skin:  [] ?Rashes   [] ?Ulcers   [] ?Wounds Psychological:  [] ?History of anxiety   [] ? History of major depression.  Physical Examination  Vitals:   09/20/19 1443  BP: (!) 153/87  Pulse: 62  Resp: 16  Weight: 229 lb 12.8 oz (104.2 kg)   Body mass index is 37.09 kg/m. Gen:  WD/WN, NAD Head: [place]/AT, No temporalis wasting. Ear/Nose/Throat: Hearing grossly intact, nares w/o erythema or drainage, trachea midline Eyes: Conjunctiva clear. Sclera non-icteric Neck: Supple.  No bruit  Pulmonary:  Good air movement, equal and clear to auscultation bilaterally.  Cardiac: RRR, No JVD Vascular:  Vessel Right Left  Radial Palpable Palpable           Musculoskeletal: M/S 5/5 throughout.  No deformity or atrophy. No edema. Neurologic: CN 2-12 intact. Sensation grossly intact in extremities.  Symmetrical.  Speech is fluent. Motor exam as listed above. Psychiatric: Judgment intact, Mood & affect appropriate for pt's clinical situation. Dermatologic: No rashes or ulcers noted.  No cellulitis or open wounds.      CBC Lab Results  Component Value Date   WBC 4.6 10/25/2018   HGB 13.5 10/25/2018   HCT 40.7 10/25/2018   MCV 89.6 10/25/2018   PLT 244 10/25/2018    BMET    Component Value Date/Time   NA 141 06/09/2018 1129   K  4.0 06/09/2018 1129   CL 107 06/09/2018 1129   CO2 27 06/09/2018 1129   GLUCOSE 113 (H) 06/09/2018 1129   BUN 16 06/09/2018 1129   CREATININE 0.91 06/09/2018 1129   CALCIUM 8.8 (L) 06/09/2018 1129   GFRNONAA >60 06/09/2018 1129   GFRAA >60 06/09/2018 1129   CrCl cannot be calculated (Patient's most recent lab result is older than the maximum 21 days allowed.).  COAG No results found for: INR, PROTIME  Radiology No results found.   Assessment/Plan Diabetes (HCC) blood glucose control important in reducing the progression of atherosclerotic disease. Also, involved in wound healing. On appropriate medications.   Essential hypertension blood pressure control important in reducing the progression of atherosclerotic disease. On appropriate oral medications.   Abnormal perimenopausal bleeding Had an ablation by GYN last year  Carotid stenosis Her  carotid duplex today shows nearly normal carotid artery flow bilaterally with mild plaque and intimal thickening bilaterally.  I have reviewed the images and there clearly consistent with good flow and minimal disease.  It would appear as if her previous ultrasound was spurious and at this point I think we can check this every 2 to 3 years with duplex and follow-up.  Certainly reasonable to continue a baby aspirin daily as well as a statin agent to avoid progression going forward.    Leotis Pain, MD  09/20/2019 3:54 PM    This note was created with Dragon medical transcription system.  Any errors from dictation are purely unintentional

## 2019-09-20 NOTE — Assessment & Plan Note (Signed)
Her carotid duplex today shows nearly normal carotid artery flow bilaterally with mild plaque and intimal thickening bilaterally.  I have reviewed the images and there clearly consistent with good flow and minimal disease.  It would appear as if her previous ultrasound was spurious and at this point I think we can check this every 2 to 3 years with duplex and follow-up.  Certainly reasonable to continue a baby aspirin daily as well as a statin agent to avoid progression going forward.

## 2019-10-25 ENCOUNTER — Other Ambulatory Visit: Payer: Self-pay | Admitting: Family Medicine

## 2019-10-25 DIAGNOSIS — Z1231 Encounter for screening mammogram for malignant neoplasm of breast: Secondary | ICD-10-CM

## 2019-12-22 ENCOUNTER — Other Ambulatory Visit: Payer: Self-pay

## 2019-12-22 DIAGNOSIS — Z0000 Encounter for general adult medical examination without abnormal findings: Secondary | ICD-10-CM

## 2019-12-23 LAB — SPECIMEN STATUS

## 2019-12-23 LAB — T4, FREE: Free T4: 1.4 ng/dL (ref 0.82–1.77)

## 2019-12-23 LAB — T3, FREE: T3, Free: 2.4 pg/mL (ref 2.0–4.4)

## 2019-12-23 LAB — TSH: TSH: 1.15 u[IU]/mL (ref 0.450–4.500)

## 2021-08-26 ENCOUNTER — Other Ambulatory Visit: Payer: Self-pay | Admitting: Family Medicine

## 2021-08-26 DIAGNOSIS — Z139 Encounter for screening, unspecified: Secondary | ICD-10-CM

## 2021-08-27 ENCOUNTER — Ambulatory Visit
Admission: RE | Admit: 2021-08-27 | Discharge: 2021-08-27 | Disposition: A | Discharge status: Home / Self Care | Payer: BC Managed Care – PPO | Source: Ambulatory Visit | Attending: Family Medicine | Admitting: Family Medicine

## 2021-08-27 ENCOUNTER — Other Ambulatory Visit: Payer: Self-pay

## 2021-08-27 DIAGNOSIS — Z139 Encounter for screening, unspecified: Secondary | ICD-10-CM

## 2021-09-13 ENCOUNTER — Other Ambulatory Visit: Payer: Self-pay

## 2021-09-13 DIAGNOSIS — E1165 Type 2 diabetes mellitus with hyperglycemia: Secondary | ICD-10-CM

## 2021-09-13 DIAGNOSIS — E038 Other specified hypothyroidism: Secondary | ICD-10-CM

## 2021-09-13 DIAGNOSIS — E785 Hyperlipidemia, unspecified: Secondary | ICD-10-CM

## 2021-09-13 DIAGNOSIS — I10 Essential (primary) hypertension: Secondary | ICD-10-CM

## 2021-09-15 LAB — BASIC METABOLIC PANEL
BUN/Creatinine Ratio: 21 (ref 9–23)
BUN: 17 mg/dL (ref 6–24)
CO2: 24 mmol/L (ref 20–29)
Calcium: 9.1 mg/dL (ref 8.7–10.2)
Chloride: 102 mmol/L (ref 96–106)
Creatinine, Ser: 0.81 mg/dL (ref 0.57–1.00)
Glucose: 95 mg/dL (ref 70–99)
Potassium: 4.5 mmol/L (ref 3.5–5.2)
Sodium: 141 mmol/L (ref 134–144)
eGFR: 84 mL/min/{1.73_m2} (ref >59)

## 2021-09-15 LAB — TSH+FREE T4
Free T4: 1.42 ng/dL (ref 0.82–1.77)
TSH: 1.28 u[IU]/mL (ref 0.450–4.500)

## 2021-09-15 LAB — T3, FREE: T3, Free: 2.7 pg/mL (ref 2.0–4.4)

## 2021-09-15 LAB — HEPATIC FUNCTION PANEL
ALT: 16 IU/L (ref 0–32)
AST: 20 IU/L (ref 0–40)
Albumin: 4.6 g/dL (ref 3.8–4.9)
Alkaline Phosphatase: 66 IU/L (ref 44–121)
Bilirubin Total: 0.5 mg/dL (ref 0.0–1.2)
Bilirubin, Direct: 0.14 mg/dL (ref 0.00–0.40)
Total Protein: 7.5 g/dL (ref 6.0–8.5)

## 2021-09-15 LAB — LIPID PANEL
Chol/HDL Ratio: 2.4 ratio (ref 0.0–4.4)
Cholesterol, Total: 86 mg/dL — ABNORMAL LOW (ref 100–199)
HDL: 36 mg/dL — ABNORMAL LOW (ref >39)
LDL Chol Calc (NIH): 34 mg/dL (ref 0–99)
Triglycerides: 76 mg/dL (ref 0–149)
VLDL Cholesterol Cal: 16 mg/dL (ref 5–40)

## 2021-11-22 ENCOUNTER — Other Ambulatory Visit: Payer: Self-pay

## 2021-11-22 ENCOUNTER — Other Ambulatory Visit: Payer: BC Managed Care – PPO

## 2021-11-22 DIAGNOSIS — I10 Essential (primary) hypertension: Secondary | ICD-10-CM

## 2021-11-22 DIAGNOSIS — E039 Hypothyroidism, unspecified: Secondary | ICD-10-CM

## 2021-11-22 DIAGNOSIS — E6609 Other obesity due to excess calories: Secondary | ICD-10-CM

## 2021-11-22 DIAGNOSIS — E119 Type 2 diabetes mellitus without complications: Secondary | ICD-10-CM

## 2021-11-22 DIAGNOSIS — D509 Iron deficiency anemia, unspecified: Secondary | ICD-10-CM

## 2021-11-22 DIAGNOSIS — E559 Vitamin D deficiency, unspecified: Secondary | ICD-10-CM

## 2021-11-24 LAB — CBC WITH DIFFERENTIAL/PLATELET
Basophils Absolute: 0 10*3/uL (ref 0.0–0.2)
Basos: 1 %
EOS (ABSOLUTE): 0.1 10*3/uL (ref 0.0–0.4)
Eos: 2 %
Hematocrit: 40.3 % (ref 34.0–46.6)
Hemoglobin: 13.7 g/dL (ref 11.1–15.9)
Immature Grans (Abs): 0 10*3/uL (ref 0.0–0.1)
Immature Granulocytes: 0 %
Lymphocytes Absolute: 1.9 10*3/uL (ref 0.7–3.1)
Lymphs: 39 %
MCH: 30.4 pg (ref 26.6–33.0)
MCHC: 34 g/dL (ref 31.5–35.7)
MCV: 89 fL (ref 79–97)
Monocytes Absolute: 0.3 10*3/uL (ref 0.1–0.9)
Monocytes: 7 %
Neutrophils Absolute: 2.5 10*3/uL (ref 1.4–7.0)
Neutrophils: 51 %
Platelets: 240 10*3/uL (ref 150–450)
RBC: 4.51 x10E6/uL (ref 3.77–5.28)
RDW: 12.6 % (ref 11.7–15.4)
WBC: 4.8 10*3/uL (ref 3.4–10.8)

## 2021-11-24 LAB — HGB A1C W/O EAG: Hgb A1c MFr Bld: 7 % — ABNORMAL HIGH (ref 4.8–5.6)

## 2021-11-24 LAB — TSH: TSH: 0.77 u[IU]/mL (ref 0.450–4.500)

## 2021-11-24 LAB — T4, FREE: Free T4: 1.85 ng/dL — ABNORMAL HIGH (ref 0.82–1.77)

## 2021-11-24 LAB — T3, FREE: T3, Free: 2.6 pg/mL (ref 2.0–4.4)

## 2021-11-24 LAB — VITAMIN D 25 HYDROXY (VIT D DEFICIENCY, FRACTURES): Vit D, 25-Hydroxy: 55.7 ng/mL (ref 30.0–100.0)

## 2022-08-21 ENCOUNTER — Other Ambulatory Visit: Payer: Self-pay | Admitting: Family Medicine

## 2022-08-21 DIAGNOSIS — Z1231 Encounter for screening mammogram for malignant neoplasm of breast: Secondary | ICD-10-CM

## 2022-08-26 ENCOUNTER — Ambulatory Visit
Admission: RE | Admit: 2022-08-26 | Discharge: 2022-08-26 | Disposition: A | Discharge status: Home / Self Care | Payer: BC Managed Care – PPO | Source: Ambulatory Visit | Attending: Family Medicine | Admitting: Family Medicine

## 2022-08-26 DIAGNOSIS — Z1231 Encounter for screening mammogram for malignant neoplasm of breast: Secondary | ICD-10-CM

## 2022-09-08 ENCOUNTER — Encounter (INDEPENDENT_AMBULATORY_CARE_PROVIDER_SITE_OTHER): Payer: Self-pay

## 2022-11-28 ENCOUNTER — Encounter: Payer: BC Managed Care – PPO | Admitting: Obstetrics and Gynecology

## 2022-11-28 ENCOUNTER — Encounter: Payer: BC Managed Care – PPO | Admitting: Licensed Practical Nurse

## 2022-12-11 ENCOUNTER — Ambulatory Visit: Payer: BC Managed Care – PPO | Admitting: Licensed Practical Nurse

## 2022-12-11 ENCOUNTER — Encounter: Payer: Self-pay | Admitting: Licensed Practical Nurse

## 2022-12-11 VITALS — BP 129/81 | HR 57 | Wt 182.2 lb

## 2022-12-11 DIAGNOSIS — R87618 Other abnormal cytological findings on specimens from cervix uteri: Secondary | ICD-10-CM | POA: Diagnosis not present

## 2022-12-11 DIAGNOSIS — Z719 Counseling, unspecified: Secondary | ICD-10-CM

## 2022-12-11 NOTE — Progress Notes (Signed)
SUBJECTIVE: Was sent here by her PCP for an abnormal Pap.  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 Alexis Garcia reports she had an abnormal pap requiring a "biopsy" about 29 years ago when she was pregnant with her daughter. Her PCP did a full PE and she had a mammogram in October.  Declines PE today.   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 At this time there is not a provider available that can do a colpo.  Instructed to schedule appointment for a colpo. Pt verbalizes understanding.   Roberto Scales, [facility]  12/11/22  9:06 AM

## 2022-12-24 NOTE — Progress Notes (Unsigned)
    GYNECOLOGY OFFICE COLPOSCOPY PROCEDURE NOTE  62 y.o. [id number] here for colposcopy for {Findings; lab pap smear results:16707::"no abnormalities"} pap smear on ***. Discussed role for HPV in cervical dysplasia, need for surveillance.  Patient gave informed written consent, time out was performed.  Placed in lithotomy position. Cervix viewed with speculum and colposcope after application of acetic acid.   Colposcopy adequate? {yes/no:20286}  {Findings; colposcopy:728}; corresponding biopsies obtained.  ECC specimen obtained. All specimens were labeled and sent to pathology.  Chaperone was present during entire procedure.  Patient was given post procedure instructions.  Will follow up pathology and manage accordingly; patient will be contacted with results and recommendations.  Routine preventative health maintenance measures emphasized.    Rubie Maid, MD [place]

## 2022-12-25 ENCOUNTER — Encounter: Payer: Self-pay | Admitting: Obstetrics and Gynecology

## 2022-12-25 ENCOUNTER — Ambulatory Visit (INDEPENDENT_AMBULATORY_CARE_PROVIDER_SITE_OTHER): Payer: BC Managed Care – PPO | Admitting: Obstetrics and Gynecology

## 2022-12-25 ENCOUNTER — Other Ambulatory Visit (HOSPITAL_COMMUNITY)
Admission: RE | Admit: 2022-12-25 | Discharge: 2022-12-25 | Disposition: A | Discharge status: Home / Self Care | Payer: BC Managed Care – PPO | Source: Ambulatory Visit | Attending: Obstetrics and Gynecology | Admitting: Obstetrics and Gynecology

## 2022-12-25 DIAGNOSIS — R87618 Other abnormal cytological findings on specimens from cervix uteri: Secondary | ICD-10-CM | POA: Diagnosis not present

## 2022-12-26 ENCOUNTER — Encounter: Payer: Self-pay | Admitting: Obstetrics and Gynecology

## 2023-01-02 LAB — SURGICAL PATHOLOGY

## 2023-05-20 IMAGING — MG MM DIGITAL SCREENING BILAT W/ TOMO AND CAD
6 of 10 series · 6 of 30 positions shown · non-contrast
Comparison: Previous exam(s).

CLINICAL DATA: Screening.

EXAM:
DIGITAL SCREENING BILATERAL MAMMOGRAM WITH TOMOSYNTHESIS AND CAD
TECHNIQUE: Bilateral screening digital craniocaudal and mediolateral oblique
mammograms were obtained. Bilateral screening digital breast
tomosynthesis was performed. The images were evaluated with
computer-aided detection.

[L CC synth-2D]
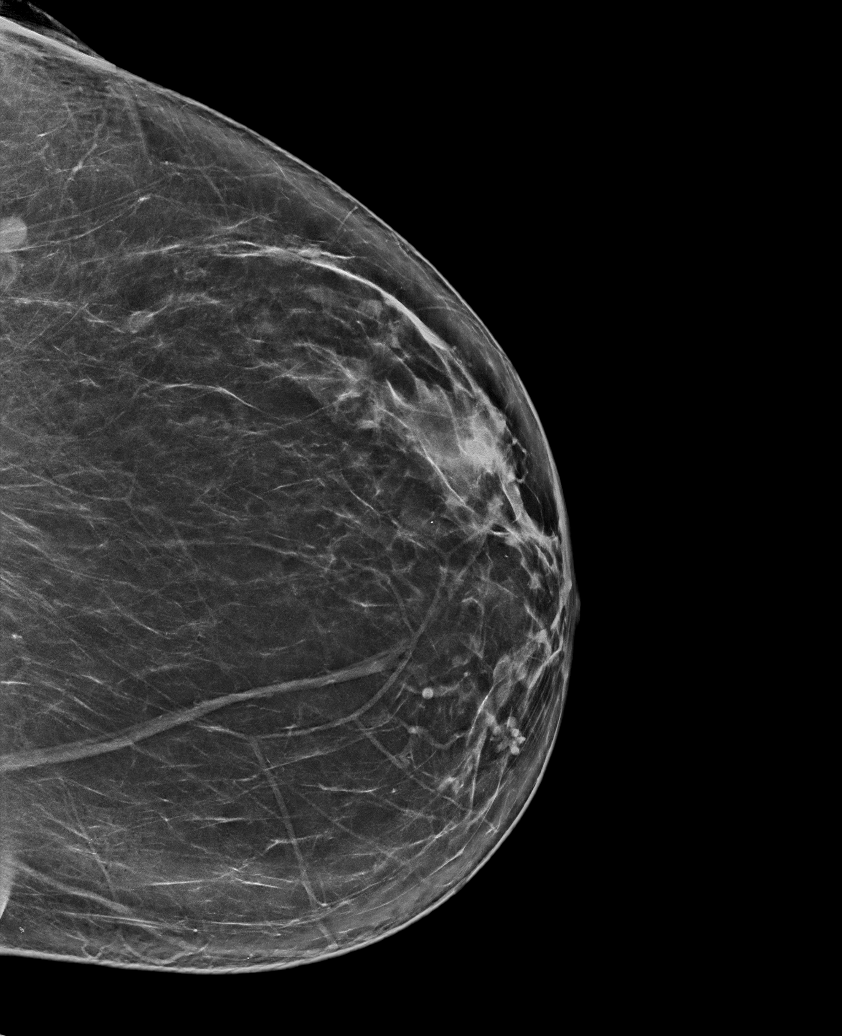

[R CC synth-2D]
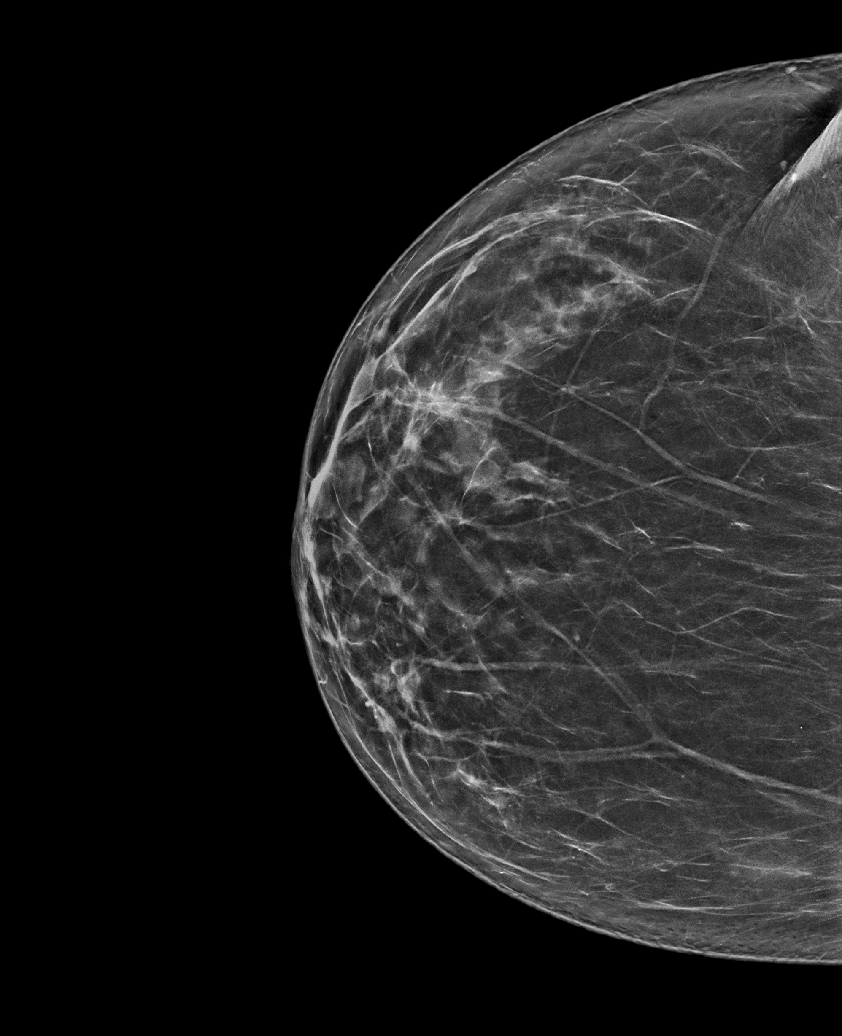

[R MLO synth-2D]
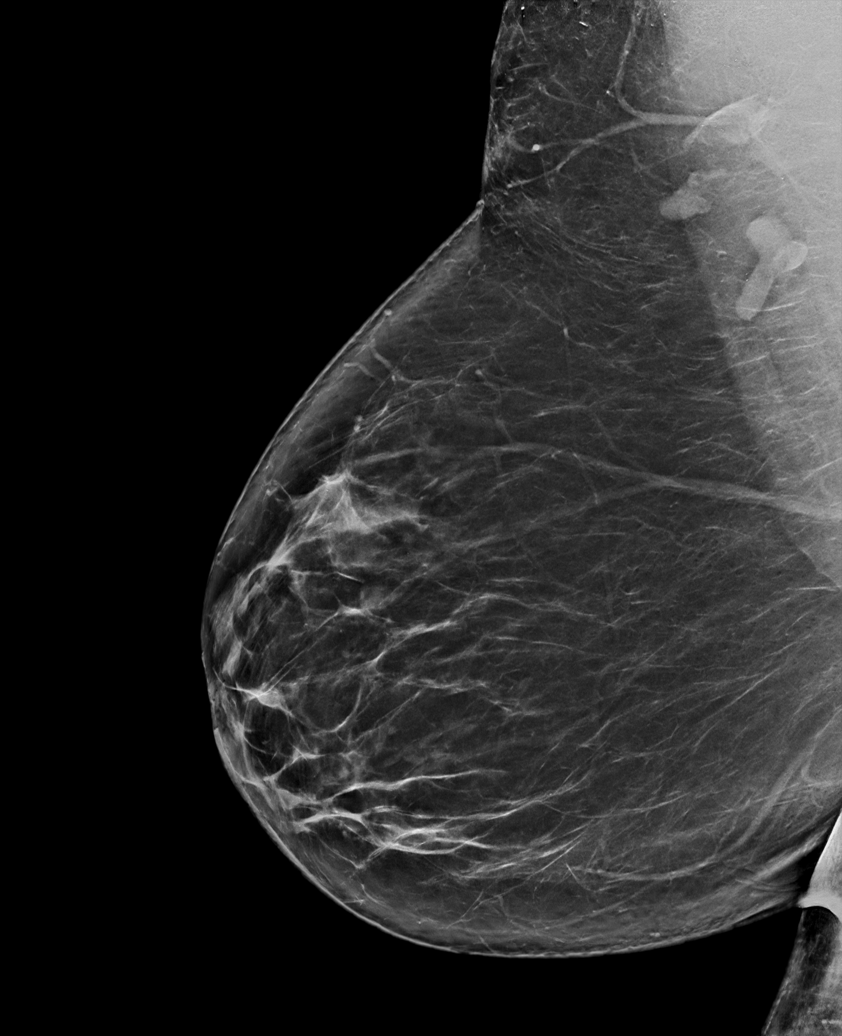

[L MLO synth-2D]
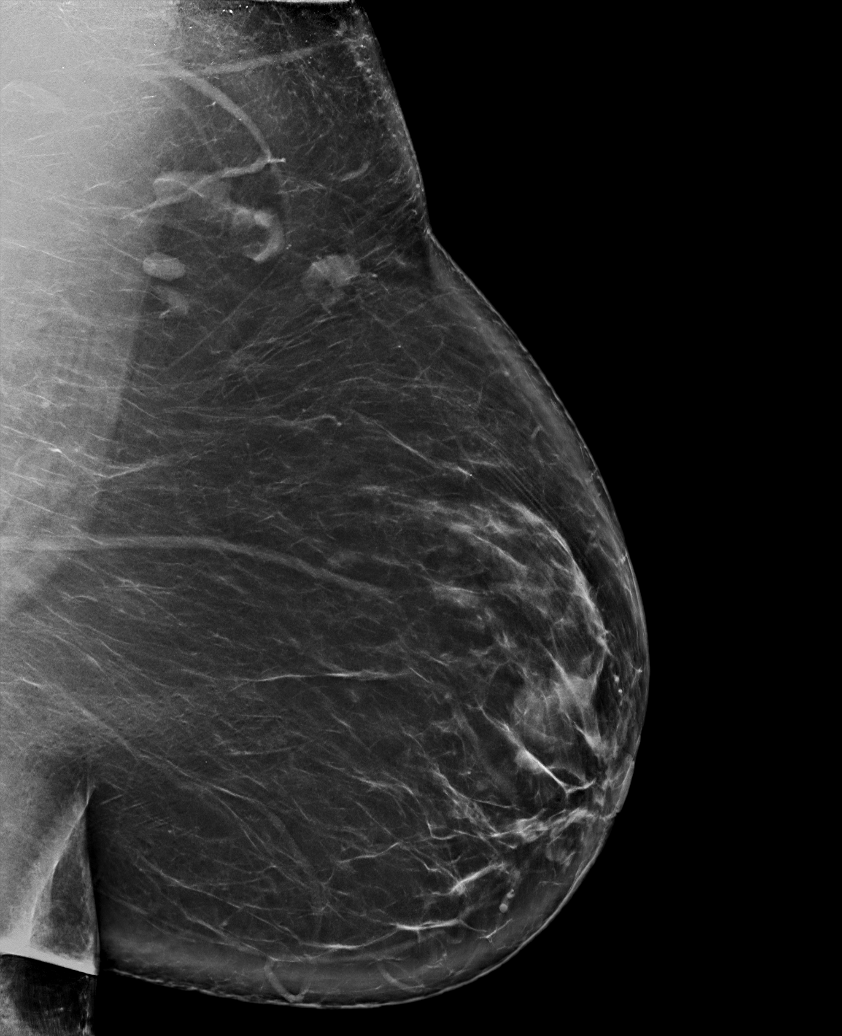

[R XCCL synth-2D]
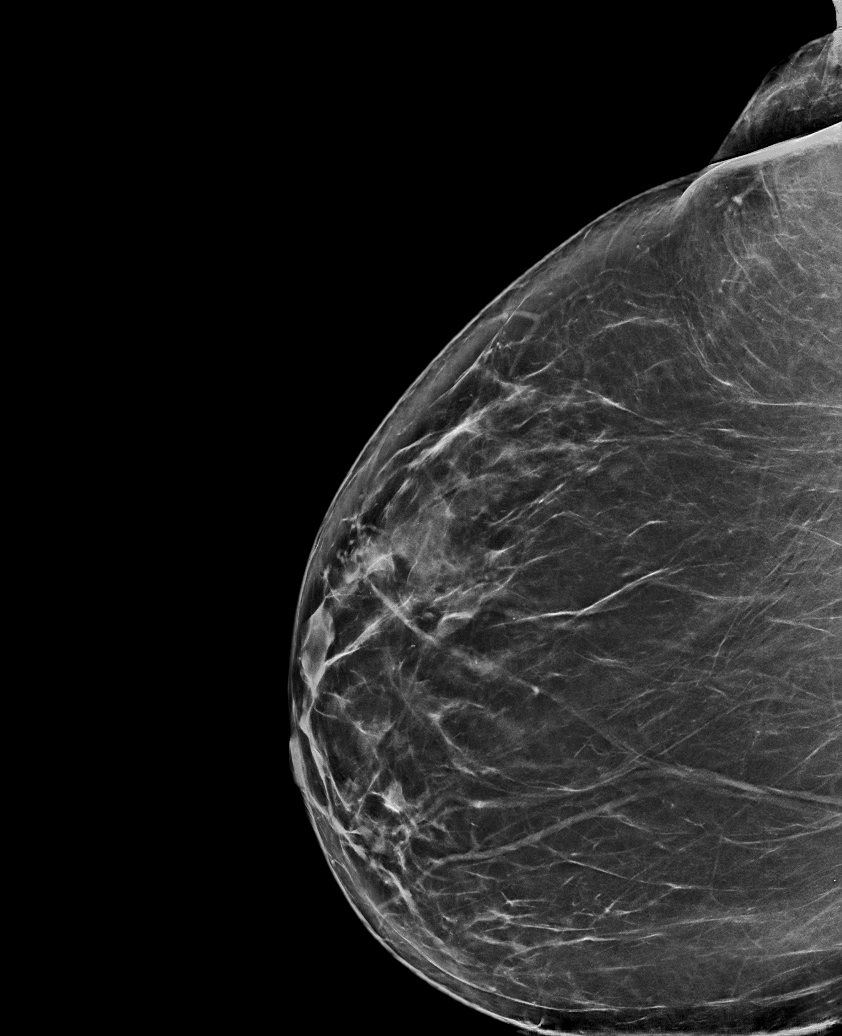

[R XCCL tomo · tomo slice 40/79.0]
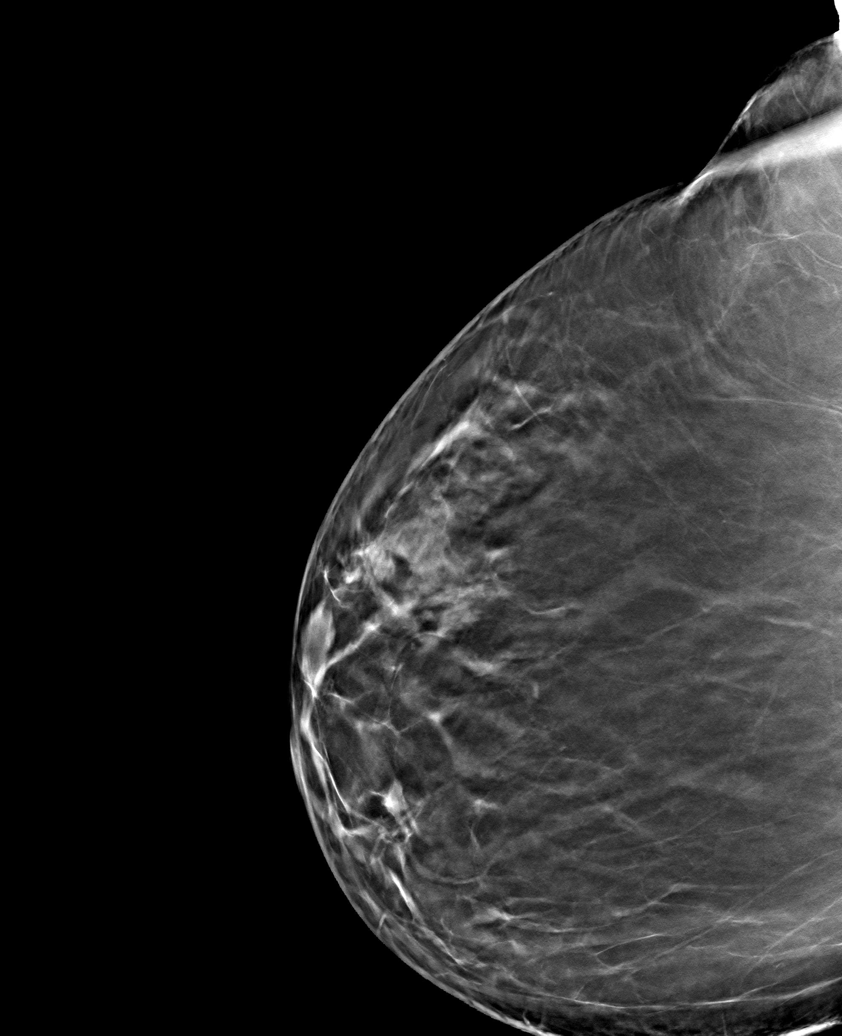

[6 of 30 positions shown; findings below may reference images not displayed]

ACR Breast Density Category b: There are scattered areas of
fibroglandular density.
FINDINGS: There are no findings suspicious for malignancy.
IMPRESSION: No mammographic evidence of malignancy. A result letter of this
screening mammogram will be mailed directly to the patient.

RECOMMENDATION:
Screening mammogram in one year. (Code:51-O-LD2)

BI-RADS CATEGORY  1: Negative.

## 2023-08-04 ENCOUNTER — Encounter: Payer: Self-pay | Admitting: Internal Medicine

## 2023-08-04 ENCOUNTER — Other Ambulatory Visit: Payer: Self-pay

## 2023-08-04 ENCOUNTER — Observation Stay
Admission: EM | Admit: 2023-08-04 | Discharge: 2023-08-07 | Disposition: A | Discharge status: Home / Self Care | Payer: BC Managed Care – PPO | Attending: Internal Medicine | Admitting: Internal Medicine

## 2023-08-04 ENCOUNTER — Emergency Department: Payer: BC Managed Care – PPO

## 2023-08-04 DIAGNOSIS — I10 Essential (primary) hypertension: Secondary | ICD-10-CM | POA: Diagnosis not present

## 2023-08-04 DIAGNOSIS — C7801 Secondary malignant neoplasm of right lung: Secondary | ICD-10-CM | POA: Diagnosis not present

## 2023-08-04 DIAGNOSIS — J4489 Other specified chronic obstructive pulmonary disease: Secondary | ICD-10-CM | POA: Diagnosis not present

## 2023-08-04 DIAGNOSIS — E119 Type 2 diabetes mellitus without complications: Secondary | ICD-10-CM | POA: Insufficient documentation

## 2023-08-04 DIAGNOSIS — C787 Secondary malignant neoplasm of liver and intrahepatic bile duct: Secondary | ICD-10-CM | POA: Diagnosis not present

## 2023-08-04 DIAGNOSIS — Z79899 Other long term (current) drug therapy: Secondary | ICD-10-CM | POA: Insufficient documentation

## 2023-08-04 DIAGNOSIS — E039 Hypothyroidism, unspecified: Secondary | ICD-10-CM | POA: Diagnosis not present

## 2023-08-04 DIAGNOSIS — C189 Malignant neoplasm of colon, unspecified: Principal | ICD-10-CM | POA: Insufficient documentation

## 2023-08-04 DIAGNOSIS — D649 Anemia, unspecified: Secondary | ICD-10-CM | POA: Diagnosis not present

## 2023-08-04 DIAGNOSIS — F17210 Nicotine dependence, cigarettes, uncomplicated: Secondary | ICD-10-CM | POA: Diagnosis not present

## 2023-08-04 DIAGNOSIS — C7802 Secondary malignant neoplasm of left lung: Secondary | ICD-10-CM | POA: Diagnosis not present

## 2023-08-04 DIAGNOSIS — K439 Ventral hernia without obstruction or gangrene: Secondary | ICD-10-CM | POA: Diagnosis not present

## 2023-08-04 DIAGNOSIS — C799 Secondary malignant neoplasm of unspecified site: Secondary | ICD-10-CM | POA: Diagnosis not present

## 2023-08-04 DIAGNOSIS — Z7984 Long term (current) use of oral hypoglycemic drugs: Secondary | ICD-10-CM | POA: Diagnosis not present

## 2023-08-04 DIAGNOSIS — Z7985 Long-term (current) use of injectable non-insulin antidiabetic drugs: Secondary | ICD-10-CM | POA: Insufficient documentation

## 2023-08-04 DIAGNOSIS — N852 Hypertrophy of uterus: Secondary | ICD-10-CM | POA: Diagnosis not present

## 2023-08-04 DIAGNOSIS — R918 Other nonspecific abnormal finding of lung field: Secondary | ICD-10-CM

## 2023-08-04 DIAGNOSIS — R55 Syncope and collapse: Principal | ICD-10-CM

## 2023-08-04 LAB — CBC WITH DIFFERENTIAL/PLATELET
Abs Immature Granulocytes: 0.04 10*3/uL (ref 0.00–0.07)
Basophils Absolute: 0 10*3/uL (ref 0.0–0.1)
Basophils Relative: 1 %
Eosinophils Absolute: 0.1 10*3/uL (ref 0.0–0.5)
Eosinophils Relative: 1 %
HCT: 25.9 % — ABNORMAL LOW (ref 36.0–46.0)
Hemoglobin: 8.3 g/dL — ABNORMAL LOW (ref 12.0–15.0)
Immature Granulocytes: 1 %
Lymphocytes Relative: 15 %
Lymphs Abs: 1 10*3/uL (ref 0.7–4.0)
MCH: 27 pg (ref 26.0–34.0)
MCHC: 32 g/dL (ref 30.0–36.0)
MCV: 84.4 fL (ref 80.0–100.0)
Monocytes Absolute: 0.5 10*3/uL (ref 0.1–1.0)
Monocytes Relative: 7 %
Neutro Abs: 5 10*3/uL (ref 1.7–7.7)
Neutrophils Relative %: 75 %
Platelets: 525 10*3/uL — ABNORMAL HIGH (ref 150–400)
RBC: 3.07 MIL/uL — ABNORMAL LOW (ref 3.87–5.11)
RDW: 15.1 % (ref 11.5–15.5)
WBC: 6.6 10*3/uL (ref 4.0–10.5)
nRBC: 0 % (ref 0.0–0.2)

## 2023-08-04 LAB — COMPREHENSIVE METABOLIC PANEL
ALT: 29 U/L (ref 0–44)
AST: 45 U/L — ABNORMAL HIGH (ref 15–41)
Albumin: 2.7 g/dL — ABNORMAL LOW (ref 3.5–5.0)
Alkaline Phosphatase: 96 U/L (ref 38–126)
Anion gap: 15 (ref 5–15)
BUN: 26 mg/dL — ABNORMAL HIGH (ref 6–20)
CO2: 20 mmol/L — ABNORMAL LOW (ref 22–32)
Calcium: 8 mg/dL — ABNORMAL LOW (ref 8.9–10.3)
Chloride: 102 mmol/L (ref 98–111)
Creatinine, Ser: 0.96 mg/dL (ref 0.44–1.00)
GFR, Estimated: >60 mL/min (ref >60)
Glucose, Bld: 81 mg/dL (ref 70–99)
Potassium: 3.8 mmol/L (ref 3.5–5.1)
Sodium: 137 mmol/L (ref 135–145)
Total Bilirubin: 1.6 mg/dL — ABNORMAL HIGH (ref 0.3–1.2)
Total Protein: 6.9 g/dL (ref 6.5–8.1)

## 2023-08-04 LAB — IRON AND TIBC
Iron: 42 ug/dL (ref 28–170)
Saturation Ratios: 23 % (ref 10.4–31.8)
TIBC: 183 ug/dL — ABNORMAL LOW (ref 250–450)
UIBC: 141 ug/dL

## 2023-08-04 LAB — HEMOGLOBIN A1C
Hgb A1c MFr Bld: 6 % — ABNORMAL HIGH (ref 4.8–5.6)
Mean Plasma Glucose: 125.5 mg/dL

## 2023-08-04 LAB — CBG MONITORING, ED: Glucose-Capillary: 76 mg/dL (ref 70–99)

## 2023-08-04 LAB — GLUCOSE, CAPILLARY: Glucose-Capillary: 123 mg/dL — ABNORMAL HIGH (ref 70–99)

## 2023-08-04 LAB — TROPONIN I (HIGH SENSITIVITY): Troponin I (High Sensitivity): 8 ng/L (ref <18)

## 2023-08-04 LAB — FERRITIN: Ferritin: 338 ng/mL — ABNORMAL HIGH (ref 11–307)

## 2023-08-04 LAB — D-DIMER, QUANTITATIVE: D-Dimer, Quant: 5.8 ug/mL-FEU — ABNORMAL HIGH (ref 0.00–0.50)

## 2023-08-04 LAB — MAGNESIUM: Magnesium: 1.9 mg/dL (ref 1.7–2.4)

## 2023-08-04 MED ORDER — POLYETHYLENE GLYCOL 3350 17 G PO PACK: 17.0000 g | PACK | Freq: Every day | ORAL | Status: DC | PRN

## 2023-08-04 MED ORDER — LACTATED RINGERS IV SOLN: INTRAVENOUS | Status: AC

## 2023-08-04 MED ORDER — SODIUM CHLORIDE 0.9% FLUSH
3.0000 mL | Freq: Two times a day (BID) | INTRAVENOUS | Status: DC
  Administered 2023-08-04 – 2023-08-07 (×5): 3 mL via INTRAVENOUS

## 2023-08-04 MED ORDER — ONDANSETRON HCL 4 MG PO TABS: 4.0000 mg | ORAL_TABLET | Freq: Four times a day (QID) | ORAL | Status: DC | PRN

## 2023-08-04 MED ORDER — IOHEXOL 350 MG/ML SOLN
75.0000 mL | Freq: Once | INTRAVENOUS | Status: AC | PRN
  Administered 2023-08-04: 75 mL via INTRAVENOUS

## 2023-08-04 MED ORDER — ACETAMINOPHEN 650 MG RE SUPP: 650.0000 mg | Freq: Four times a day (QID) | RECTAL | Status: DC | PRN

## 2023-08-04 MED ORDER — ATORVASTATIN CALCIUM 20 MG PO TABS
10.0000 mg | ORAL_TABLET | Freq: Every day | ORAL | Status: DC
  Administered 2023-08-04 – 2023-08-06 (×3): 10 mg via ORAL
  Filled 2023-08-04 (×3): qty 1

## 2023-08-04 MED ORDER — ONDANSETRON HCL 4 MG/2ML IJ SOLN
4.0000 mg | Freq: Four times a day (QID) | INTRAMUSCULAR | Status: DC | PRN
  Administered 2023-08-05: 4 mg via INTRAVENOUS
  Filled 2023-08-04: qty 2

## 2023-08-04 MED ORDER — INSULIN ASPART 100 UNIT/ML IJ SOLN
0.0000 [IU] | Freq: Three times a day (TID) | INTRAMUSCULAR | Status: DC
  Administered 2023-08-05: 1 [IU] via SUBCUTANEOUS
  Administered 2023-08-05 – 2023-08-07 (×3): 2 [IU] via SUBCUTANEOUS
  Filled 2023-08-04 (×4): qty 1

## 2023-08-04 MED ORDER — SODIUM CHLORIDE 0.9 % IV BOLUS
500.0000 mL | Freq: Once | INTRAVENOUS | Status: AC
  Administered 2023-08-04: 500 mL via INTRAVENOUS

## 2023-08-04 MED ORDER — LEVOTHYROXINE SODIUM 50 MCG PO TABS
75.0000 ug | ORAL_TABLET | Freq: Every day | ORAL | Status: DC
  Administered 2023-08-05 – 2023-08-07 (×3): 75 ug via ORAL
  Filled 2023-08-04 (×3): qty 1

## 2023-08-04 MED ORDER — ACETAMINOPHEN 325 MG PO TABS: 650.0000 mg | ORAL_TABLET | Freq: Four times a day (QID) | ORAL | Status: DC | PRN

## 2023-08-04 MED ORDER — ALBUTEROL SULFATE (2.5 MG/3ML) 0.083% IN NEBU: 3.0000 mL | INHALATION_SOLUTION | Freq: Four times a day (QID) | RESPIRATORY_TRACT | Status: DC | PRN

## 2023-08-04 NOTE — Assessment & Plan Note (Signed)
-   Given blood pressure on the lower end of normal, hold home antihypertensives - Orthostatic vital signs pending

## 2023-08-04 NOTE — H&P (Signed)
History and Physical    Patient: Alexis Garcia VWU:[id number] DOB: June 05, 1961 DOA: 08/04/2023 DOS: the patient was seen and examined on 08/04/2023 PCP: Alexis Gang, FNP  Patient coming from: Home  Chief Complaint:  Chief Complaint  Patient presents with   Loss of Consciousness   HPI: Alexis Garcia is a 62 y.o. female with medical history significant of type 2 diabetes, hypertension, hypothyroidism, multinodular goiter, COPD/asthma, carotid artery disease, who presents to the ED due to syncope.  Alexis Garcia states that she was placed on Rybelsus over 1 year ago and since then, has lost over 100 pounds. She also has been experiencing daily nausea and vomiting that she attributes to changes in taste that occurred when she started Rybelsus as well.  Then last week, she went on a cruise and had 2 syncopal episodes.  The first occurred when she was sitting down and felt unwell and was told by others that she was unresponsive.  The medical team on the cruise evaluated her and told her her blood pressure was very low and to stop her lisinopril.  She did so as instructed however 2 days later, when standing up from a chair she lost consciousness and fell forward hitting her head onto a pole.  Since the second fall, she has also discontinued hydrochlorothiazide.  She continues to take metoprolol daily.  She denies any palpitations, chest pain, shortness of breath.  She occasionally experiences left lower quadrant abdominal pain but no changes in stool caliber.  Alexis Garcia states she smoked cigarettes for 20 years total and quit 3 months ago.   ED course: On arrival to the ED, patient's blood pressure was on the lower end of normal at 99/66 with heart rate of 59.  She was saturating at 94% on room air.  She was afebrile at 98.4. Initial workup notable for hemoglobin of 8.3 with MCV of 84.4, bicarb 20, BUN 26, creatinine 0.96, albumin 2.7, AST 45, total bilirubin 1.6 and GFR above 60.  Troponin negative.   D-dimer elevated at 5.8 so a CTA was obtained.  Results notable for bilateral pulmonary nodules predominantly on the left in addition to multiple ill-defined densities in the hepatic parenchyma concerning for metastatic disease.  No PE noted.  Due to recurrent syncopal episodes, [facility] contacted for admission.  Review of Systems: As mentioned in the history of present illness. All other systems reviewed and are negative. Past Medical History:  Diagnosis Date   Abnormal uterine bleeding    Asthma    Cardiomegaly    Cardiovascular disease    COPD (chronic obstructive pulmonary disease) (HCC)    Diabetes mellitus without complication (HCC)    Goiter, nontoxic, multinodular    Hypertension    Hypothyroidism    Thyrotoxicosis    Past Surgical History:  Procedure Laterality Date   CESAREAN SECTION     COLONOSCOPY WITH PROPOFOL N/A 10/29/2015   Procedure: COLONOSCOPY WITH PROPOFOL;  Surgeon: Elnita Maxwell, MD;  Location: [facility] ENDOSCOPY;  Service: Endoscopy;  Laterality: N/A;   DILITATION & CURRETTAGE/HYSTROSCOPY WITH NOVASURE ABLATION N/A 06/25/2018   Procedure: DILATATION & CURETTAGE/HYSTEROSCOPY WITH MINERVA ABLATION;  Surgeon: Hildred Laser, MD;  Location: [facility] ORS;  Service: Gynecology;  Laterality: N/A;   Social History:  reports that she has been smoking cigarettes. She has never used smokeless tobacco. She reports that she does not drink alcohol and does not use drugs.  No Known Allergies  Family History  Adopted: Yes  Problem Relation Age of Onset  Breast cancer Neg Hx     Prior to Admission medications   Medication Sig Start Date End Date Taking? Authorizing Provider  albuterol (PROVENTIL HFA;VENTOLIN HFA) 108 (90 Base) MCG/ACT inhaler Inhale 2 puffs into the lungs every 6 (six) hours as needed (for exercise induced asthma).    [provider]  cholecalciferol (VITAMIN D) 400 units TABS tablet Take 400 Units by mouth daily.    [provider]  ezetimibe  (ZETIA) 10 MG tablet Take 10 mg by mouth daily. 03/22/19   [provider]  hydrochlorothiazide (HYDRODIURIL) 25 MG tablet Take 25 mg by mouth daily.    [provider]  levothyroxine (SYNTHROID, LEVOTHROID) 88 MCG tablet Take 88 mcg by mouth daily before breakfast.    [provider]  lisinopril (PRINIVIL,ZESTRIL) 2.5 MG tablet Take 2.5 mg by mouth every evening.     [provider]  metFORMIN (GLUCOPHAGE) 500 MG tablet Take 500 mg by mouth 2 (two) times daily.     [provider]  metoprolol tartrate (LOPRESSOR) 25 MG tablet Take 25 mg by mouth 2 (two) times daily.    [provider]  Multiple Vitamins-Minerals (ALIVE ONCE DAILY WOMENS PO) Take 2 tablets by mouth daily. Gummies    [provider]  Omega-3 Fatty Acids (FISH OIL) 1000 MG CAPS Take 1,000 mg by mouth daily.    [provider]  phentermine (ADIPEX-P) 37.5 MG tablet Take 37.5 mg by mouth daily before breakfast.    [provider]  umeclidinium-vilanterol (ANORO ELLIPTA) 62.5-25 MCG/INH AEPB Inhale 1 puff into the lungs daily as needed.    [provider]    Physical Exam: Vitals:   08/04/23 1630 08/04/23 1700 08/04/23 1730 08/04/23 1824  BP: 134/68 114/70 116/69 122/73  Pulse: (!) 57 60 (!) 58 95  Resp: 19 17 16 17   Temp:    98 F (36.7 C)  TempSrc:    Oral  SpO2: 100% 100% 100% 100%  Weight:      Height:       Physical Exam Vitals and nursing note reviewed.  Constitutional:      General: She is not in acute distress.    Appearance: She is normal weight.  HENT:     Head: Normocephalic.     Comments: Bandage on the left eyebrow Eyes:     Conjunctiva/sclera: Conjunctivae normal.     Pupils: Pupils are equal, round, and reactive to light.  Cardiovascular:     Rate and Rhythm: Normal rate and regular rhythm.     Heart sounds: Murmur (3/6 decrescendo systolic murmur) heard.  Pulmonary:     Effort: Pulmonary effort is normal. No  respiratory distress.     Breath sounds: Normal breath sounds. No wheezing, rhonchi or rales.  Abdominal:     General: Bowel sounds are normal. There is no distension.     Palpations: Abdomen is soft.     Tenderness: There is no abdominal tenderness. There is no guarding.  Musculoskeletal:     Right lower leg: No edema.     Left lower leg: No edema.  Skin:    General: Skin is warm and dry.  Neurological:     General: No focal deficit present.     Mental Status: She is alert and oriented to person, place, and time. Mental status is at baseline.  Psychiatric:        Mood and Affect: Mood normal.        Behavior: Behavior normal.  Thought Content: Thought content normal.        Judgment: Judgment normal.    Data Reviewed: CBC with WBC of 6.6, hemoglobin of 8.3, MCV 84.4 and platelets of 525 CMP with sodium of 137, potassium 3.8, bicarb 20, glucose 81, BUN 26, creatinine 0.96, calcium 8.0, anion gap 15, albumin 2.7, AST 45, GFR above 60 Magnesium 1.9 Troponin negative at 8 D-dimer 5.8  EKG personally reviewed.  Sinus rhythm with rate of 67.  No AV block noted.  QTc within normal limits.  No acute ST or T wave changes concerning for acute ischemia.  CT Angio Chest PE W and/or Wo Contrast  Result Date: 08/04/2023 CLINICAL DATA:  Syncope.  Hypotension. EXAM: CT ANGIOGRAPHY CHEST WITH CONTRAST TECHNIQUE: Multidetector CT imaging of the chest was performed using the standard protocol during bolus administration of intravenous contrast. Multiplanar CT image reconstructions and MIPs were obtained to evaluate the vascular anatomy. RADIATION DOSE REDUCTION: This exam was performed according to the departmental dose-optimization program which includes automated exposure control, adjustment of the mA and/or kV according to patient size and/or use of iterative reconstruction technique. CONTRAST:  75mL OMNIPAQUE IOHEXOL 350 MG/ML SOLN COMPARISON:  None Available. FINDINGS: Cardiovascular:  Satisfactory opacification of the pulmonary arteries to the segmental level. No evidence of pulmonary embolism. Normal heart size. No pericardial effusion. Mediastinum/Nodes: No enlarged mediastinal, hilar, or axillary lymph nodes. Thyroid gland, trachea, and esophagus demonstrate no significant findings. Lungs/Pleura: No pneumothorax or pleural effusion is noted. Multiple pulmonary nodules are noted bilaterally, predominantly on the left side. The largest nodule measures 1.4 cm in left lower lobe. 10 mm irregular nodule is noted in left upper lobe. 4 mm nodule is noted in right upper lobe. These are concerning for metastatic disease. Upper Abdomen: Multiple ill-defined large low densities are noted in the hepatic parenchyma highly concerning for metastatic disease. Musculoskeletal: No chest wall abnormality. No acute or significant osseous findings. Review of the MIP images confirms the above findings. IMPRESSION: Bilateral pulmonary nodules are noted, predominantly on the left, concerning for metastatic disease. Also noted are multiple ill-defined large low densities in the hepatic parenchyma concerning for metastatic disease is well. PET scan is recommended for further evaluation. No definite evidence of pulmonary embolus. Electronically Signed   By: Lupita Raider M.D.   On: 08/04/2023 16:18    Results are pending, will review when available.  Assessment and Plan:  * Syncope Patient has had 3 syncopal episode in the past 1 week, most recently today at her PCPs office.  No prodromal symptoms other than feeling unwell.  EKG with no concerning findings, however of note sinus bradycardia with no AV block.  In light of severe anemia and poor p.o. intake, strongly suspect orthostatic hypotension, however notable murmur with no previous echo.  - Telemetry monitoring - Orthostatic vital signs - Echocardiogram ordered - Hold home antihypertensives  Normocytic anemia Patient has a historically normal  hemoglobin previously 13.7 in January 2023.Her PCP has been evaluating her for severe anemia of unknown etiology.  Hemoglobin currently 8.3.  - Repeat CBC in a.m. - Transfuse for hemoglobin less than 7 - Iron panel pending  Metastatic disease (HCC) Per chart review, patient was started on semaglutide about 1 year ago and had massive weight loss.  Since February 2024, she is lost about 36 pounds per epic.  In light of this, she has had a negative mammogram in October 2023.  Colposcopy in February 2024 appears with no evidence of malignancy or dysplasia.  CTA today demonstrates multiple pulmonary and hepatic nodules concerning for metastatic disease.  Given long-term tobacco use, differential includes metastatic lung primary versus given anemia, colon primary.  - Oncology consulted; appreciate their recommendations - Continue staging workup with CT of the abdomen  Essential hypertension - Given blood pressure on the lower end of normal, hold home antihypertensives - Orthostatic vital signs pending  Diabetes (HCC) - Hold home antiglycemic agents - A1c pending - SSI, sensitive  Advance Care Planning:   Code Status: Full Code   Consults: Oncology  Family Communication: Patient's daughter updated at bedside  Severity of Illness: The appropriate patient status for this patient is OBSERVATION. Observation status is judged to be reasonable and necessary in order to provide the required intensity of service to ensure the patient's safety. The patient's presenting symptoms, physical exam findings, and initial radiographic and laboratory data in the context of their medical condition is felt to place them at decreased risk for further clinical deterioration. Furthermore, it is anticipated that the patient will be medically stable for discharge from the hospital within 2 midnights of admission.   Author: Verdene Lennert, MD 08/04/2023 6:42 PM  For on call review [email or web address].

## 2023-08-04 NOTE — Assessment & Plan Note (Signed)
Per chart review, patient was started on semaglutide about 1 year ago and had massive weight loss.  Since February 2024, she is lost about 36 pounds per epic.  In light of this, she has had a negative mammogram in October 2023.  Colposcopy in February 2024 appears with no evidence of malignancy or dysplasia.  CTA today demonstrates multiple pulmonary and hepatic nodules concerning for metastatic disease.  Given long-term tobacco use, differential includes metastatic lung primary versus given anemia, colon primary.  - Oncology consulted; appreciate their recommendations - Continue staging workup with CT of the abdomen

## 2023-08-04 NOTE — ED Provider Notes (Signed)
Care of this patient assumed from prior physician at 1500 pending CT angiogram, reevaluation, and disposition. Please see prior physician note for further details.  Briefly this is a 62 year old female presenting after a syncopal episode at her physician's office.  Has had significant unintended weight loss over the last year.    Her blood work here showed normal white blood cell count, anemia with hemoglobin of 8.3, 13.71-year ago, they reportedly was anemic on blood work from a week ago.  CMP without critical derangements.  Normal troponin.  D-dimer significantly elevated, so CTA was ordered.  CTA resulted without evidence of PE, but did demonstrate multiple pulmonary nodules concerning for metastatic disease with abnormal appearance of liver also concerning for metastatic disease.  Discussed the results of this with the patient.  No known history of cancer.  I did discuss possibility of admission for further evaluation in the setting of her anemia with syncopal episodes versus discharge with close follow-up regarding her concerning CT findings.  Patient does prefer to be admitted which I do think is reasonable.  Will reach out to hospitalist team.  Case reviewed with Dr. Huel Cote.  She will evaluate the patient for anticipated admission.   Trinna Post, MD 08/04/23 2352

## 2023-08-04 NOTE — Assessment & Plan Note (Signed)
-  Hold home antiglycemic agents - A1c pending - SSI, sensitive

## 2023-08-04 NOTE — Assessment & Plan Note (Signed)
Patient has a historically normal hemoglobin previously 13.7 in January 2023.Her PCP has been evaluating her for severe anemia of unknown etiology.  Hemoglobin currently 8.3.  - Repeat CBC in a.m. - Transfuse for hemoglobin less than 7 - Iron panel pending

## 2023-08-04 NOTE — ED Provider Notes (Signed)
[facility] Provider Note    Event Date/Time   First MD Initiated Contact with Patient 08/04/23 1155     (approximate)   History   Loss of Consciousness   HPI  Alexis Garcia is a 62 y.o. female who presents to the emergency department today after a syncopal episode.  Patient was at her doctor's office when this occurred.  She was there to follow-up on some abnormal blood work that was obtained earlier this month in relation to large unintended weight loss over the past year.  Patient states that she was told her thyroid was off.  Since her initial blood draw earlier this month patient did go on a cruise and had a syncopal episode during the cruise.  The patient today felt like she had to close her eyes before she passed out.  She denied any chest pain or palpitations.     Physical Exam   Triage Vital Signs: ED Triage Vitals  Encounter Vitals Group     BP 08/04/23 1141 108/68     Systolic BP Percentile --      Diastolic BP Percentile --      Pulse Rate 08/04/23 1158 61     Resp 08/04/23 1141 18     Temp 08/04/23 1141 98.4 F (36.9 C)     Temp Source 08/04/23 1141 Oral     SpO2 08/04/23 1144 98 %     Weight 08/04/23 1142 146 lb 13.2 oz (66.6 kg)     Height 08/04/23 1142 5\' 6"  (1.676 m)     Head Circumference --      Peak Flow --      Pain Score --      Pain Loc --      Pain Education --      Exclude from Growth Chart --     Most recent vital signs: Vitals:   08/04/23 1148 08/04/23 1158  BP: 116/73   Pulse:  61  Resp:    Temp:    SpO2:     General: Awake, alert, oriented. CV:  Good peripheral perfusion. Regular rate and rhythm. Resp:  Normal effort. Lungs clear. Abd:  No distention.   ED Results / Procedures / Treatments   Labs (all labs ordered are listed, but only abnormal results are displayed) Labs Reviewed  CBC WITH DIFFERENTIAL/PLATELET - Abnormal; Notable for the following components:      Result Value   RBC 3.07 (*)     Hemoglobin 8.3 (*)    HCT 25.9 (*)    Platelets 525 (*)    All other components within normal limits  COMPREHENSIVE METABOLIC PANEL  MAGNESIUM  CBG MONITORING, ED  TROPONIN I (HIGH SENSITIVITY)     EKG  I, Phineas Semen, attending physician, personally viewed and interpreted this EKG  EKG Time: 1146 Rate: 67 Rhythm: sinus rhythm Axis: normal Intervals: qtc 440 QRS: narrow, q waves v1 ST changes: no st elevation Impression: abnormal ekg   RADIOLOGY None   PROCEDURES:  Critical Care performed: No  MEDICATIONS ORDERED IN ED: Medications  sodium chloride 0.9 % bolus 500 mL (500 mLs Intravenous New Bag/Given 08/04/23 1155)     IMPRESSION / MDM / ASSESSMENT AND PLAN / ED COURSE  I reviewed the triage vital signs and the nursing notes.                              Differential diagnosis  includes, but is not limited to, dehydration, anemia, cardiac etiology  Patient's presentation is most consistent with acute presentation with potential threat to life or bodily function.   The patient is on the cardiac monitor to evaluate for evidence of arrhythmia and/or significant heart rate changes.  Patient presented to the emergency department today because of a syncopal episode.  She had a syncopal episode earlier this week when she was on a cruise.  At the time my exam patient is awake and alert.  No significant hypotension.  Patient's blood work does show anemia which is consistent with blood work obtained about a week ago.  D-dimer was ordered given her recent travel and was elevated.  Did order a CT angio.  Awaiting radiology read at time of signout.      FINAL CLINICAL IMPRESSION(S) / ED DIAGNOSES   Syncope    Note:  This document was prepared using Dragon voice recognition software and may include unintentional dictation errors.    Phineas Semen, MD 08/04/23 [phone]

## 2023-08-04 NOTE — Assessment & Plan Note (Addendum)
Patient has had 3 syncopal episode in the past 1 week, most recently today at her PCPs office.  No prodromal symptoms other than feeling unwell.  EKG with no concerning findings, however of note sinus bradycardia with no AV block.  In light of severe anemia and poor p.o. intake, strongly suspect orthostatic hypotension, however notable murmur with no previous echo.  - Telemetry monitoring - Orthostatic vital signs - Echocardiogram ordered - Hold home antihypertensives

## 2023-08-04 NOTE — ED Triage Notes (Signed)
Pt arrives via [facility] from doctor's office for syncope. Pt noted to have low BP at the doctor's office that responded to IVF from EMS. Pt reports she fell last week while on a cruise and it was thought to be due to low BP. Pt has had poor PO intake due to n/v. Pt arrives alert and able to answer all questions appropriately. Pt denies any fever or other problems. She reports 100lb weight loss over the past year.

## 2023-08-05 ENCOUNTER — Observation Stay: Payer: BC Managed Care – PPO

## 2023-08-05 DIAGNOSIS — C189 Malignant neoplasm of colon, unspecified: Secondary | ICD-10-CM | POA: Diagnosis not present

## 2023-08-05 DIAGNOSIS — C7800 Secondary malignant neoplasm of unspecified lung: Secondary | ICD-10-CM

## 2023-08-05 DIAGNOSIS — R55 Syncope and collapse: Secondary | ICD-10-CM | POA: Diagnosis not present

## 2023-08-05 DIAGNOSIS — C787 Secondary malignant neoplasm of liver and intrahepatic bile duct: Secondary | ICD-10-CM | POA: Diagnosis not present

## 2023-08-05 DIAGNOSIS — R918 Other nonspecific abnormal finding of lung field: Secondary | ICD-10-CM | POA: Diagnosis not present

## 2023-08-05 LAB — BASIC METABOLIC PANEL
Anion gap: 10 (ref 5–15)
BUN: 26 mg/dL — ABNORMAL HIGH (ref 6–20)
CO2: 25 mmol/L (ref 22–32)
Calcium: 8 mg/dL — ABNORMAL LOW (ref 8.9–10.3)
Chloride: 102 mmol/L (ref 98–111)
Creatinine, Ser: 0.82 mg/dL (ref 0.44–1.00)
GFR, Estimated: >60 mL/min (ref >60)
Glucose, Bld: 78 mg/dL (ref 70–99)
Potassium: 3.2 mmol/L — ABNORMAL LOW (ref 3.5–5.1)
Sodium: 137 mmol/L (ref 135–145)

## 2023-08-05 LAB — GLUCOSE, CAPILLARY
Glucose-Capillary: 81 mg/dL (ref 70–99)
Glucose-Capillary: 141 mg/dL — ABNORMAL HIGH (ref 70–99)
Glucose-Capillary: 152 mg/dL — ABNORMAL HIGH (ref 70–99)
Glucose-Capillary: 112 mg/dL — ABNORMAL HIGH (ref 70–99)

## 2023-08-05 LAB — CBC
HCT: 24.4 % — ABNORMAL LOW (ref 36.0–46.0)
Hemoglobin: 7.8 g/dL — ABNORMAL LOW (ref 12.0–15.0)
MCH: 27.4 pg (ref 26.0–34.0)
MCHC: 32 g/dL (ref 30.0–36.0)
MCV: 85.6 fL (ref 80.0–100.0)
Platelets: 620 10*3/uL — ABNORMAL HIGH (ref 150–400)
RBC: 2.85 MIL/uL — ABNORMAL LOW (ref 3.87–5.11)
RDW: 14.8 % (ref 11.5–15.5)
WBC: 5.6 10*3/uL (ref 4.0–10.5)
nRBC: 0 % (ref 0.0–0.2)

## 2023-08-05 LAB — PROTIME-INR
INR: 1.3 — ABNORMAL HIGH (ref 0.8–1.2)
Prothrombin Time: 16.6 seconds — ABNORMAL HIGH (ref 11.4–15.2)

## 2023-08-05 LAB — HIV ANTIBODY (ROUTINE TESTING W REFLEX): HIV Screen 4th Generation wRfx: NONREACTIVE

## 2023-08-05 MED ORDER — POTASSIUM CHLORIDE CRYS ER 20 MEQ PO TBCR
40.0000 meq | EXTENDED_RELEASE_TABLET | ORAL | Status: AC
  Administered 2023-08-05 (×2): 40 meq via ORAL
  Filled 2023-08-05 (×2): qty 2

## 2023-08-05 MED ORDER — ADULT MULTIVITAMIN W/MINERALS CH
1.0000 | ORAL_TABLET | Freq: Every day | ORAL | Status: DC
  Administered 2023-08-05 – 2023-08-07 (×3): 1 via ORAL
  Filled 2023-08-05 (×3): qty 1

## 2023-08-05 MED ORDER — ENSURE ENLIVE PO LIQD
237.0000 mL | Freq: Three times a day (TID) | ORAL | Status: DC
  Administered 2023-08-05 – 2023-08-07 (×5): 237 mL via ORAL

## 2023-08-05 MED ORDER — IOHEXOL 300 MG/ML  SOLN
100.0000 mL | Freq: Once | INTRAMUSCULAR | Status: AC | PRN
  Administered 2023-08-05: 100 mL via INTRAVENOUS

## 2023-08-05 MED ORDER — IOHEXOL 9 MG/ML PO SOLN
500.0000 mL | ORAL | Status: AC
  Administered 2023-08-05 (×2): 500 mL via ORAL

## 2023-08-05 NOTE — Progress Notes (Signed)
Physical Therapy Evaluation Patient Details Name: Alexis Garcia MRN: [id number] DOB: Sep 20, 1961 Today's Date: 08/05/2023  History of Present Illness  Alexis Garcia is a 62 y.o. female with medical history significant of type 2 diabetes, hypertension, hypothyroidism, multinodular goiter, COPD/asthma, carotid artery disease, who presents to the ED due to syncope.   Clinical Impression  Patient supine in bed upon arrival, with patient agreeable to PT evaluation. Prior to admission, patient reports she was IND for mobility and ADLs, was working and driving. Does endorse gradual reduction in energy level over last few weeks. Orthostatic vital assessment completed, with normal BP readings this date/time. On evaluation, patient was Mod I with bed mobility and transfers (increased time required) supervision provided with ambulation due to history of syncope. Patient able to ambulate approx 45 ft. No physical assist required. Patient is at functional baseline, therefore no skilled PT services needed at this time. Anticipate no skilled PT after hospital discharge. PT will sign off.       If plan is discharge home, recommend the following: Assist for transportation   Can travel by private vehicle        Equipment Recommendations None recommended by PT  Recommendations for Other Services       Functional Status Assessment Patient has not had a recent decline in their functional status     Precautions / Restrictions Precautions Precautions: Other (comment);Fall (Syncope) Restrictions Weight Bearing Restrictions: No      Mobility  Bed Mobility Overal bed mobility: Modified Independent             General bed mobility comments: patient able to complete supine > sit and sit > supine with Mod I, increased time required. No lightheadedness reported with bed mobility.    Transfers Overall transfer level: Modified independent Equipment used: None               General transfer  comment: patient able to stand with Mod I, no unsteadiness noted.    Ambulation/Gait Ambulation/Gait assistance: Supervision Gait Distance (Feet): 45 Feet Assistive device: None Gait Pattern/deviations: Step-through pattern       General Gait Details: slow gait speed noted, but no unsteadiness. Supervision for safety due to history of syncope. Orthostatic vitals assessed prior to ambulation.  Stairs Stairs:  (Did not participate in stair negotiation for assessment this date due to fatigue; Based on current functional status, anticipate no physical assistance required with stairs.)          Wheelchair Mobility     Tilt Bed    Modified Rankin (Stroke Patients Only)       Balance Overall balance assessment: Modified Independent                                           Pertinent Vitals/Pain Pain Assessment Pain Assessment: No/denies pain    Home Living Family/patient expects to be discharged to:: Private residence Living Arrangements: Children (Son) Available Help at Discharge: Family Type of Home: House Home Access: Stairs to enter Entrance Stairs-Rails: Can reach both;Right;Left Entrance Stairs-Number of Steps: 3   Home Layout: One level Home Equipment: None      Prior Function Prior Level of Function : Independent/Modified Independent;Working/employed;Driving             Mobility Comments: Reports IND with all aspects of mobility, no AD use. Was workijng full time at [employer] in [employer]. Patient was driving. Does report some decrease in energy level over last few weeks. ADLs Comments: IND with ADLs/IADLs     Extremity/Trunk Assessment   Upper Extremity Assessment Upper Extremity Assessment: Overall WFL for tasks assessed    Lower Extremity Assessment Lower Extremity Assessment: Overall WFL for tasks assessed    Cervical / Trunk Assessment Cervical / Trunk Assessment: Normal  Communication    Communication Communication: No apparent difficulties  Cognition Arousal: Alert Behavior During Therapy: WFL for tasks assessed/performed Overall Cognitive Status: Within Functional Limits for tasks assessed                                          General Comments General comments (skin integrity, edema, etc.): Assessed Orthostatic Vitals prior to mobility. Supine: 114/69, Seated: 119/79, Standing: 126/72. Patient denied symptoms of lightheadedness with completion.    Exercises     Assessment/Plan    PT Assessment Patient does not need any further PT services  PT Problem List         PT Treatment Interventions      PT Goals (Current goals can be found in the Care Plan section)  Acute Rehab PT Goals Patient Stated Goal: get back home PT Goal Formulation: With patient    Frequency       Co-evaluation               AM-PAC PT "6 Clicks" Mobility  Outcome Measure Help needed turning from your back to your side while in a flat bed without using bedrails?: None Help needed moving from lying on your back to sitting on the side of a flat bed without using bedrails?: None Help needed moving to and from a bed to a chair (including a wheelchair)?: None Help needed standing up from a chair using your arms (e.g., wheelchair or bedside chair)?: None Help needed to walk in hospital room?: None Help needed climbing 3-5 steps with a railing? : A Little 6 Click Score: 23    End of Session Equipment Utilized During Treatment: Gait belt Activity Tolerance: Patient tolerated treatment well Patient left: in bed;with bed alarm set;with call bell/phone within reach Nurse Communication: Mobility status PT Visit Diagnosis: History of falling (Z91.81)    Time: 1610-9604 PT Time Calculation (min) (ACUTE ONLY): 24 min   Charges:   PT Evaluation $PT Eval Low Complexity: 1 Low   PT General Charges $$ ACUTE PT VISIT: 1 Visit         Howie Ill, PT,  DPT 08/05/23 9:41 AM

## 2023-08-05 NOTE — Consult Note (Signed)
[facility] GI Inpatient Consult Note   Jamey Reas, M.D.  Reason for Consult: Syncope, Right colon mass on CT scan, anemia   Attending Requesting Consult: Rosezetta Schlatter, M.D.  Outpatient Primary Physician: Hildred Laser, M.D.  History of Present Illness: Alexis Garcia is a 62 y.o. female who presents with a syncopal episode from home.  Patient has had several syncopal episodes and some recently while on vacation on a cruise ship.  She had been taking an antihypertensive and was told to stop that by crew ship medical personnel.  Patient Denies any abdominal pain, melena, hematemesis, dysphagia, anorexia.  She has lost almost 200 pounds in the last year.  This is for the most part involuntary weight loss.  She denies any history of seizures or strokes. Patient underwent a colonoscopy by Dr. Dow Adolph on October 29, 2015 including multiple polyps in the ascending and cecal region.  Patient underwent piecemeal polypectomy which was not complete.  Patient was advised to repeat colonoscopy in 6 months for close surveillance although for what ever reason this did not occur.  She did deal with other health issues such as bleeding uterine fibroids ultimately requiring hysteroscopy with D&C and endometrial ablation in August 2019 by Dr. Valentino Saxon. Patient denies any chest pain, palpitations or shortness of breath.  Past Medical History:  Past Medical History:  Diagnosis Date   Abnormal uterine bleeding    Asthma    Cardiomegaly    Cardiovascular disease    COPD (chronic obstructive pulmonary disease) (HCC)    Diabetes mellitus without complication (HCC)    Goiter, nontoxic, multinodular    Hypertension    Hypothyroidism    Thyrotoxicosis     Problem List: Patient Active Problem List   Diagnosis Date Noted   Syncope 08/04/2023   Normocytic anemia 08/04/2023   Metastatic disease (HCC) 08/04/2023   Diabetes (HCC) 05/17/2019   Essential hypertension 05/17/2019   Carotid  stenosis 05/17/2019   Vision change - seeing shadows purple gray in left eye  09/24/2018   Acute pain in left eye 09/24/2018   Uterine leiomyoma 05/18/2018   Abnormal perimenopausal bleeding 05/18/2018    Past Surgical History: Past Surgical History:  Procedure Laterality Date   CESAREAN SECTION     COLONOSCOPY WITH PROPOFOL N/A 10/29/2015   Procedure: COLONOSCOPY WITH PROPOFOL;  Surgeon: Elnita Maxwell, MD;  Location: [facility] ENDOSCOPY;  Service: Endoscopy;  Laterality: N/A;   DILITATION & CURRETTAGE/HYSTROSCOPY WITH NOVASURE ABLATION N/A 06/25/2018   Procedure: DILATATION & CURETTAGE/HYSTEROSCOPY WITH MINERVA ABLATION;  Surgeon: Hildred Laser, MD;  Location: [facility] ORS;  Service: Gynecology;  Laterality: N/A;    Allergies: No Known Allergies  Home Medications: Medications Prior to Admission  Medication Sig Dispense Refill Last Dose   albuterol (PROVENTIL HFA;VENTOLIN HFA) 108 (90 Base) MCG/ACT inhaler Inhale 2 puffs into the lungs every 6 (six) hours as needed (for exercise induced asthma).   Past Month   atorvastatin (LIPITOR) 10 MG tablet SMARTSIG:1 Tablet(s) By Mouth Every Evening   Past Week   cholecalciferol (VITAMIN D) 400 units TABS tablet Take 400 Units by mouth daily.   Past Week   ezetimibe (ZETIA) 10 MG tablet Take 10 mg by mouth daily.   Past Week   hydrochlorothiazide (HYDRODIURIL) 25 MG tablet Take 25 mg by mouth daily.   Past Week   levothyroxine (SYNTHROID) 75 MCG tablet Take 75 mcg by mouth daily.   Past Week   lisinopril (ZESTRIL) 20 MG tablet Take 20 mg  by mouth daily.   Past Week   metFORMIN (GLUCOPHAGE) 500 MG tablet Take 500 mg by mouth 2 (two) times daily.    Past Week   metoprolol tartrate (LOPRESSOR) 25 MG tablet Take 25 mg by mouth 2 (two) times daily.   Past Week   Multiple Vitamins-Minerals (ALIVE ONCE DAILY WOMENS PO) Take 2 tablets by mouth daily. Gummies   Past Week   RYBELSUS 7 MG TABS Take 1 tablet by mouth daily.   Past Week   levothyroxine  (SYNTHROID, LEVOTHROID) 88 MCG tablet Take 88 mcg by mouth daily before breakfast. (Patient not taking: Reported on 08/05/2023)   Not Taking   lisinopril (PRINIVIL,ZESTRIL) 2.5 MG tablet Take 2.5 mg by mouth every evening.  (Patient not taking: Reported on 08/05/2023)   Not Taking   Omega-3 Fatty Acids (FISH OIL) 1000 MG CAPS Take 1,000 mg by mouth daily. (Patient not taking: Reported on 08/05/2023)   Not Taking   phentermine (ADIPEX-P) 37.5 MG tablet Take 37.5 mg by mouth daily before breakfast. (Patient not taking: Reported on 08/05/2023)   Not Taking   umeclidinium-vilanterol (ANORO ELLIPTA) 62.5-25 MCG/INH AEPB Inhale 1 puff into the lungs daily as needed. (Patient not taking: Reported on 08/05/2023)   Not Taking   Home medication reconciliation was completed with the patient.   Scheduled Inpatient Medications:    atorvastatin  10 mg Oral QHS   feeding supplement  237 mL Oral TID BM   insulin aspart  0-9 Units Subcutaneous TID WC   levothyroxine  75 mcg Oral Q0600   multivitamin with minerals  1 tablet Oral Daily   potassium chloride  40 mEq Oral Q4H   sodium chloride flush  3 mL Intravenous Q12H    Continuous Inpatient Infusions:    PRN Inpatient Medications:  acetaminophen **OR** acetaminophen, albuterol, ondansetron **OR** ondansetron (ZOFRAN) IV, polyethylene glycol  Family History: family history is not on file. She was adopted.   GI Family History: Negative  Social History:   reports that she has been smoking cigarettes. She has never used smokeless tobacco. She reports that she does not drink alcohol and does not use drugs. The patient denies ETOH, tobacco, or drug use.    Review of Systems: Review of Systems - Negative except HPI  Physical Examination: BP 104/64 (BP Location: Left Arm)   Pulse 74   Temp 98.6 F (37 C)   Resp 18   Ht 5\' 6"  (1.676 m)   Wt 66.6 kg   SpO2 97%   BMI 23.70 kg/m  Physical Exam Vitals and nursing note reviewed.  Constitutional:       General: She is not in acute distress.    Appearance: She is normal weight. She is not ill-appearing, toxic-appearing or diaphoretic.  HENT:     Head: Normocephalic and atraumatic.     Nose: Nose normal.     Mouth/Throat:     Mouth: Mucous membranes are moist.     Pharynx: Oropharynx is clear.  Eyes:     General: No scleral icterus.    Extraocular Movements: Extraocular movements intact.     Pupils: Pupils are equal, round, and reactive to light.  Cardiovascular:     Rate and Rhythm: Normal rate and regular rhythm.     Pulses: Normal pulses.  Pulmonary:     Effort: Pulmonary effort is normal.     Breath sounds: Normal breath sounds.  Abdominal:     General: Bowel sounds are normal. There is no distension.  Palpations: Abdomen is soft. There is no mass.     Hernia: No hernia is present.  Musculoskeletal:        General: Normal range of motion.     Cervical back: Normal range of motion.  Skin:    General: Skin is warm and dry.  Neurological:     General: No focal deficit present.     Mental Status: She is alert.  Psychiatric:        Mood and Affect: Mood normal.        Behavior: Behavior normal.        Thought Content: Thought content normal.        Judgment: Judgment normal.     Data: Lab Results  Component Value Date   WBC 5.6 08/05/2023   HGB 7.8 (L) 08/05/2023   HCT 24.4 (L) 08/05/2023   MCV 85.6 08/05/2023   PLT 620 (H) 08/05/2023   Recent Labs  Lab 08/04/23 1153 08/05/23 0623  HGB 8.3* 7.8*   Lab Results  Component Value Date   NA 137 08/05/2023   K 3.2 (L) 08/05/2023   CL 102 08/05/2023   CO2 25 08/05/2023   BUN 26 (H) 08/05/2023   CREATININE 0.82 08/05/2023   Lab Results  Component Value Date   ALT 29 08/04/2023   AST 45 (H) 08/04/2023   ALKPHOS 96 08/04/2023   BILITOT 1.6 (H) 08/04/2023   Recent Labs  Lab 08/05/23 0623  INR 1.3*      Latest Ref Rng & Units 08/05/2023    6:23 AM 08/04/2023   11:53 AM 11/22/2021    9:12 AM  CBC  WBC  4.0 - 10.5 K/uL 5.6  6.6  4.8   Hemoglobin 12.0 - 15.0 g/dL 7.8  8.3  16.1   Hematocrit 36.0 - 46.0 % 24.4  25.9  40.3   Platelets 150 - 400 K/uL 620  525  240     STUDIES: CT ABDOMEN PELVIS W CONTRAST  Result Date: 08/05/2023 CLINICAL DATA:  Bilateral pulmonary nodules and ill-defined areas of low density in the liver concerning for metastatic disease on a recent chest CTA. EXAM: CT ABDOMEN AND PELVIS WITH CONTRAST TECHNIQUE: Multidetector CT imaging of the abdomen and pelvis was performed using the standard protocol following bolus administration of intravenous contrast. RADIATION DOSE REDUCTION: This exam was performed according to the departmental dose-optimization program which includes automated exposure control, adjustment of the mA and/or kV according to patient size and/or use of iterative reconstruction technique. CONTRAST:  OMNIPAQUE IOHEXOL 300 MG/ML  SOLN COMPARISON:  Chest CTA dated 08/04/2023. FINDINGS: Lower chest: Previously described lung nodules.  Normal-sized heart. Hepatobiliary: Multiple rounded and oval, heterogeneous, predominantly low-density masses throughout the liver. An index mass in the caudate lobe measures 6.6 x 4.0 cm on image number 22/2. Normal-appearing gallbladder. Pancreas: Unremarkable. No pancreatic ductal dilatation or surrounding inflammatory changes. Spleen: Normal in size without focal abnormality. Adrenals/Urinary Tract: Normal. Adrenal glands. Small bilateral simple appearing renal cysts. These do not need imaging follow-up. Unremarkable urinary bladder and visualized portions of the ureters. No hydronephrosis or urinary tract calculi. Stomach/Bowel: Large, lobulated, cecal and inferior right colon mass. This measures 6.6 x 5.0 cm on image number 56/2 and is causing high-grade luminal stenosis. This extends into the ileocecal valve and distal ileum with marked dilatation of the distal ileum with fecalized material in the lumen. There is some oral contrast  which has passed through the lumen. The stomach and proximal small bowel are unremarkable.  The appendix is diffusely enlarged. Vascular/Lymphatic: Multiple enlarged mesenteric and retroperitoneal nodes. An index left anterior para-aortic node has a short axis diameter of 15 mm on image number 47/2. Atheromatous celiac artery calcifications without aneurysm. Reproductive: Enlarged uterus containing multiple masses, 1 of which is densely calcified. The ovaries are not identified separate from these masses in there is some lobulated inferior pelvic fluid posteriorly, primarily on the left. Other: 3 ventral hernias containing hernia fat. 2.5 x 1.5 cm intramuscular lipoma anterior to the right hip. Musculoskeletal: Bilateral sacroiliac degenerative changes. Lumbar and lower thoracic spine degenerative changes with lower thoracic spine changes of DISH. No evidence of bony metastatic disease. IMPRESSION: 1. Large cecal and inferior right colon mass extending into the ileocecal valve and distal ileum with high-grade luminal stenosis and partial distal small bowel obstruction. This is also extending into the appendix and is compatible with a primary colon carcinoma. 2. Multiple hepatic metastases. 3. Multiple metastatic mesenteric and retroperitoneal nodes. 4. Multiple pulmonary metastases. 5. Enlarged uterus containing multiple masses, 1 of which is densely calcified. The ovaries are not identified separate from these masses and there is some loculated inferior pelvic fluid posteriorly, primarily on the left. These findings are nonspecific and could be due to uterine fibroids and possible primary or metastatic ovarian neoplasms. 6. 3 ventral hernias containing herniated fat. Electronically Signed   By: Beckie Salts M.D.   On: 08/05/2023 13:44   CT Angio Chest PE W and/or Wo Contrast  Result Date: 08/04/2023 CLINICAL DATA:  Syncope.  Hypotension. EXAM: CT ANGIOGRAPHY CHEST WITH CONTRAST TECHNIQUE: Multidetector CT  imaging of the chest was performed using the standard protocol during bolus administration of intravenous contrast. Multiplanar CT image reconstructions and MIPs were obtained to evaluate the vascular anatomy. RADIATION DOSE REDUCTION: This exam was performed according to the departmental dose-optimization program which includes automated exposure control, adjustment of the mA and/or kV according to patient size and/or use of iterative reconstruction technique. CONTRAST:  75mL OMNIPAQUE IOHEXOL 350 MG/ML SOLN COMPARISON:  None Available. FINDINGS: Cardiovascular: Satisfactory opacification of the pulmonary arteries to the segmental level. No evidence of pulmonary embolism. Normal heart size. No pericardial effusion. Mediastinum/Nodes: No enlarged mediastinal, hilar, or axillary lymph nodes. Thyroid gland, trachea, and esophagus demonstrate no significant findings. Lungs/Pleura: No pneumothorax or pleural effusion is noted. Multiple pulmonary nodules are noted bilaterally, predominantly on the left side. The largest nodule measures 1.4 cm in left lower lobe. 10 mm irregular nodule is noted in left upper lobe. 4 mm nodule is noted in right upper lobe. These are concerning for metastatic disease. Upper Abdomen: Multiple ill-defined large low densities are noted in the hepatic parenchyma highly concerning for metastatic disease. Musculoskeletal: No chest wall abnormality. No acute or significant osseous findings. Review of the MIP images confirms the above findings. IMPRESSION: Bilateral pulmonary nodules are noted, predominantly on the left, concerning for metastatic disease. Also noted are multiple ill-defined large low densities in the hepatic parenchyma concerning for metastatic disease is well. PET scan is recommended for further evaluation. No definite evidence of pulmonary embolus. Electronically Signed   By: Lupita Raider M.D.   On: 08/04/2023 16:18   @IMAGES @  Assessment:  Right Colon mass consistent  with carcinoma, possible metastatic disease to liver and both lungs , left greater than right. High grade luminal narrowing noted in the colon on CT. Syncope secondary to anemia. Normocytic anemia presumably secondary to occult bleeding from colon mass.    Recommendations:  Continue current medical management.  Colonoscopy for diagnosis and further targeted management by oncology and possible surgery. She will most likely need a diverting ileostomy at some point in the near future to avoid a bowel blockage if resection cannot be achieved. We were going to pursue colonoscopy tomorrow, but patient has eaten solid food this evening. She would like to finish medical care, transfusion and be discharged. My office will call and schedule colonoscopy here at [facility] next week.  Thank you for the consult. Please call with questions or concerns.  Rosina Lowenstein, "Mellody Dance MD [facility] [address] [place], [place] [address] [phone]  08/05/2023 5:05 PM

## 2023-08-05 NOTE — Plan of Care (Signed)

## 2023-08-05 NOTE — Progress Notes (Signed)
Initial Nutrition Assessment  DOCUMENTATION CODES:   Non-severe (moderate) malnutrition in context of chronic illness  INTERVENTION:   -Continue regular diet for widest variety of meal selections -Ensure Enlive po TID, each supplement provides 350 kcal and 20 grams of protein  -MVI with minerals daily  NUTRITION DIAGNOSIS:   Moderate Malnutrition related to chronic illness (COPD) as evidenced by mild fat depletion, moderate fat depletion, mild muscle depletion, moderate muscle depletion, percent weight loss.  GOAL:   Patient will meet greater than or equal to 90% of their needs  MONITOR:   PO intake, Supplement acceptance  REASON FOR ASSESSMENT:   Malnutrition Screening Tool    ASSESSMENT:   Pt with medical history significant of type 2 diabetes, hypertension, hypothyroidism, multinodular goiter, COPD/asthma, carotid artery disease, who presents due to syncope.  Pt admitted with syncope.   Reviewed I/O's: +500 ml x 24 hours  Per H&P, pt awaiting oncology consult and liver mass biopsy.   Spoke with pt at bedside, who was pleasant and in good spirits today. Pt reports her intake has improved since hospitalization, however, pt consumed only a few bites of eggs and pancakes today. Pt shares that she has had decreased oral intake over the past year since starting Rybelsus (which stopped taking this 2 weeks ago). Pt reports she experienced decreased appetite, early satiety, and weight loss. Weight loss came off very quickly and pt reports eating very small meals daily (a boiled egg and a quarter of a sandwich daily). 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 Weakness has been progressive and pt reports weakness and syncopal events on her cruise 2 weeks PTA. She is concerned regarding lack of appetite, weight loss, and decreased energy levels.   Pt endorses a 100# wt loss over the  past year, which she attributes to poor appetite since being prescribed Rybelsus. Reviewed wt hx; pt has experienced a 19.4% wt loss over the past 8 months, which is significant for time frame.   Discussed importance of good meal and supplement intake to promote healing. Pt amenable to supplements (just started taking Ensure PTA). Pt reports she does not want to take Rybelsus any longer; discussed mechanism of action of this drug and importance of small frequent meals and adequate protein intake to preserve lean body mass. Pt very appreciative of RD visit and information.   Medications reviewed and potassium chloride.   Lab Results  Component Value Date   HGBA1C 6.0 (H) 08/04/2023   PTA DM medications are 500 mg metformin BID.   Labs reviewed: CBGS: 81-152 (inpatient orders for glycemic control are 0-9 units insulin aspart TID).    NUTRITION - FOCUSED PHYSICAL EXAM:  Flowsheet Row Most Recent Value  Orbital Region Mild depletion  Upper Arm Region Mild depletion  Thoracic and Lumbar Region No depletion  Buccal Region No depletion  Temple Region Moderate depletion  Clavicle Bone Region Moderate depletion  Clavicle and Acromion Bone Region Moderate depletion  Scapular Bone Region Moderate depletion  Dorsal Hand Mild depletion  Patellar Region Mild depletion  Anterior Thigh Region Mild depletion  Posterior Calf Region Mild depletion  Edema (RD Assessment) None  Hair Reviewed  Eyes Reviewed  Mouth Reviewed  Skin Reviewed  Nails Reviewed       Diet Order:   Diet Order             Diet NPO time specified Except for: [employer]  with Meds  Diet effective midnight           Diet regular Room service appropriate? Yes; Fluid consistency: Thin  Diet effective now                   EDUCATION NEEDS:   Education needs have been addressed  Skin:  Skin Assessment: Reviewed RN Assessment  Last BM:  08/04/23  Height:   Ht Readings from Last 1 Encounters:  08/04/23 5\' 6"  (1.676 m)     Weight:   Wt Readings from Last 1 Encounters:  08/04/23 66.6 kg    Ideal Body Weight:  59.1 kg  BMI:  Body mass index is 23.7 kg/m.  Estimated Nutritional Needs:   Kcal:  1800-2000  Protein:  100-115 grams  Fluid:  > 1.8 L    Levada Schilling, RD, LDN, CDCES Registered Dietitian III Certified Diabetes Care and Education Specialist Please refer to [facility] for RD and/or RD on-call/weekend/after hours pager

## 2023-08-05 NOTE — Progress Notes (Signed)
Pt sent for CT via bed in stable condition.

## 2023-08-05 NOTE — Plan of Care (Signed)

## 2023-08-05 NOTE — Consult Note (Signed)
[facility]  [phone] [phone]  ID: Alexis Garcia OB: 1961/10/11  MR#: [id number]  GEX#:[id number]  Patient Care Team: Armando Gang, FNP as PCP - General (Family Medicine)  CHIEF COMPLAINT: Liver and lung nodules highly suspicious for metastatic disease.  INTERVAL HISTORY: Patient is a 62 year old female who has had multiple episodes of syncope over the past several weeks.  Upon evaluation she was noted to have a significantly reduced hemoglobin as well as lesions in both her lung and liver that were highly suspicious for metastatic malignancy.  She feels improved since admission, but not back to her baseline.  She has no neurologic complaints.  She denies any recent fevers or illnesses.  She has a significant amount of weight loss.  She has no chest pain, shortness of breath, cough, or hemoptysis.  She denies any nausea, vomiting, constipation, or diarrhea.  She has no melena or hematochezia.  She has no urinary complaints.  Patient offers no further specific complaints today.  REVIEW OF SYSTEMS:   Review of Systems  Constitutional:  Positive for malaise/fatigue and weight loss. Negative for fever.  Respiratory: Negative.  Negative for cough, hemoptysis and shortness of breath.   Cardiovascular: Negative.  Negative for chest pain and leg swelling.  Gastrointestinal: Negative.  Negative for abdominal pain, blood in stool, constipation, diarrhea, melena, nausea and vomiting.  Genitourinary: Negative.  Negative for dysuria.  Musculoskeletal: Negative.  Negative for back pain.  Skin: Negative.  Negative for rash.  Neurological:  Positive for weakness. Negative for dizziness and focal weakness.  Psychiatric/Behavioral: Negative.  The patient is not nervous/anxious.     As per HPI. Otherwise, a complete review of systems is negative.  PAST MEDICAL HISTORY: Past Medical History:  Diagnosis Date   Abnormal uterine bleeding    Asthma     Cardiomegaly    Cardiovascular disease    COPD (chronic obstructive pulmonary disease) (HCC)    Diabetes mellitus without complication (HCC)    Goiter, nontoxic, multinodular    Hypertension    Hypothyroidism    Thyrotoxicosis     PAST SURGICAL HISTORY: Past Surgical History:  Procedure Laterality Date   CESAREAN SECTION     COLONOSCOPY WITH PROPOFOL N/A 10/29/2015   Procedure: COLONOSCOPY WITH PROPOFOL;  Surgeon: Elnita Maxwell, MD;  Location: [facility] ENDOSCOPY;  Service: Endoscopy;  Laterality: N/A;   DILITATION & CURRETTAGE/HYSTROSCOPY WITH NOVASURE ABLATION N/A 06/25/2018   Procedure: DILATATION & CURETTAGE/HYSTEROSCOPY WITH MINERVA ABLATION;  Surgeon: Hildred Laser, MD;  Location: [facility] ORS;  Service: Gynecology;  Laterality: N/A;    FAMILY HISTORY: Family History  Adopted: Yes  Problem Relation Age of Onset   Breast cancer Neg Hx     ADVANCED DIRECTIVES (Y/N):  @ADVDIR @  HEALTH MAINTENANCE: Social History   Tobacco Use   Smoking status: Every Day    Current packs/day: 0.50    Types: Cigarettes   Smokeless tobacco: Never  Vaping Use   Vaping status: Former  Substance Use Topics   Alcohol use: Never   Drug use: Never     Colonoscopy:  PAP:  Bone density:  Lipid panel:  No Known Allergies  Current Facility-Administered Medications  Medication Dose Route Frequency Provider Last Rate Last Admin   acetaminophen (TYLENOL) tablet 650 mg  650 mg Oral Q6H PRN Verdene Lennert, MD       Or   acetaminophen (TYLENOL) suppository 650 mg  650 mg Rectal Q6H PRN Verdene Lennert, MD  albuterol (PROVENTIL) (2.5 MG/3ML) 0.083% nebulizer solution 3 mL  3 mL Nebulization Q6H PRN Verdene Lennert, MD       atorvastatin (LIPITOR) tablet 10 mg  10 mg Oral QHS Verdene Lennert, MD   10 mg at 08/04/23 2121   insulin aspart (novoLOG) injection 0-9 Units  0-9 Units Subcutaneous TID WC Verdene Lennert, MD   1 Units at 08/05/23 1209   levothyroxine (SYNTHROID) tablet 75 mcg  75  mcg Oral Q0600 Verdene Lennert, MD   75 mcg at 08/05/23 0551   ondansetron (ZOFRAN) tablet 4 mg  4 mg Oral Q6H PRN Verdene Lennert, MD       Or   ondansetron (ZOFRAN) injection 4 mg  4 mg Intravenous Q6H PRN Verdene Lennert, MD   4 mg at 08/05/23 0558   polyethylene glycol (MIRALAX / GLYCOLAX) packet 17 g  17 g Oral Daily PRN Verdene Lennert, MD       sodium chloride flush (NS) 0.9 % injection 3 mL  3 mL Intravenous Q12H Verdene Lennert, MD   3 mL at 08/05/23 0800    OBJECTIVE: Vitals:   08/05/23 0756 08/05/23 1115  BP: 118/70 119/66  Pulse: (!) 55 73  Resp: 18 18  Temp: (!) 97.3 F (36.3 C) 99.1 F (37.3 C)  SpO2: 90% 100%     Body mass index is 23.7 kg/m.    ECOG FS:1 - Symptomatic but completely ambulatory  General: Well-developed, well-nourished, no acute distress. Eyes: Pink conjunctiva, anicteric sclera. HEENT: Normocephalic, moist mucous membranes. Lungs: No audible wheezing or coughing. Heart: Regular rate and rhythm. Abdomen: Soft, nontender, no obvious distention. Musculoskeletal: No edema, cyanosis, or clubbing. Neuro: Alert, answering all questions appropriately. Cranial nerves grossly intact. Skin: No rashes or petechiae noted. Psych: Normal affect. Lymphatics: No cervical, calvicular, axillary or inguinal LAD.   LAB RESULTS:  Lab Results  Component Value Date   NA 137 08/05/2023   K 3.2 (L) 08/05/2023   CL 102 08/05/2023   CO2 25 08/05/2023   GLUCOSE 78 08/05/2023   BUN 26 (H) 08/05/2023   CREATININE 0.82 08/05/2023   CALCIUM 8.0 (L) 08/05/2023   PROT 6.9 08/04/2023   ALBUMIN 2.7 (L) 08/04/2023   AST 45 (H) 08/04/2023   ALT 29 08/04/2023   ALKPHOS 96 08/04/2023   BILITOT 1.6 (H) 08/04/2023   GFRNONAA >60 08/05/2023   GFRAA >60 06/09/2018    Lab Results  Component Value Date   WBC 5.6 08/05/2023   NEUTROABS 5.0 08/04/2023   HGB 7.8 (L) 08/05/2023   HCT 24.4 (L) 08/05/2023   MCV 85.6 08/05/2023   PLT 620 (H) 08/05/2023     STUDIES: CT  Angio Chest PE W and/or Wo Contrast  Result Date: 08/04/2023 CLINICAL DATA:  Syncope.  Hypotension. EXAM: CT ANGIOGRAPHY CHEST WITH CONTRAST TECHNIQUE: Multidetector CT imaging of the chest was performed using the standard protocol during bolus administration of intravenous contrast. Multiplanar CT image reconstructions and MIPs were obtained to evaluate the vascular anatomy. RADIATION DOSE REDUCTION: This exam was performed according to the departmental dose-optimization program which includes automated exposure control, adjustment of the mA and/or kV according to patient size and/or use of iterative reconstruction technique. CONTRAST:  75mL OMNIPAQUE IOHEXOL 350 MG/ML SOLN COMPARISON:  None Available. FINDINGS: Cardiovascular: Satisfactory opacification of the pulmonary arteries to the segmental level. No evidence of pulmonary embolism. Normal heart size. No pericardial effusion. Mediastinum/Nodes: No enlarged mediastinal, hilar, or axillary lymph nodes. Thyroid gland, trachea, and esophagus demonstrate no significant findings.  Lungs/Pleura: No pneumothorax or pleural effusion is noted. Multiple pulmonary nodules are noted bilaterally, predominantly on the left side. The largest nodule measures 1.4 cm in left lower lobe. 10 mm irregular nodule is noted in left upper lobe. 4 mm nodule is noted in right upper lobe. These are concerning for metastatic disease. Upper Abdomen: Multiple ill-defined large low densities are noted in the hepatic parenchyma highly concerning for metastatic disease. Musculoskeletal: No chest wall abnormality. No acute or significant osseous findings. Review of the MIP images confirms the above findings. IMPRESSION: Bilateral pulmonary nodules are noted, predominantly on the left, concerning for metastatic disease. Also noted are multiple ill-defined large low densities in the hepatic parenchyma concerning for metastatic disease is well. PET scan is recommended for further evaluation. No  definite evidence of pulmonary embolus. Electronically Signed   By: Lupita Raider M.D.   On: 08/04/2023 16:18    ASSESSMENT: Liver and lung nodules highly suspicious for metastatic disease.  PLAN:    Liver and lung nodules: Highly suspicious for metastatic disease.  Given her significant drop in hemoglobin, a colon or rectal primary is high on the differential.  Dedicated CT scan of the abdomen has been completed, but results are not available at time of dictation.  Have ordered tumor markers for completeness.  Ultimately, patient will require a liver biopsy to confirm diagnosis.  This has been ordered. Anemia: Patient's last hemoglobin prior to admission was in January 2023, but she has had a significant drop since that time.  Iron stores are within normal limits.  Recommend GI consult to assess for a primary lesion.  Transfuse if hemoglobin continues to trend down and falls below 7.0.  Appreciate consult, will follow.   Jeralyn Ruths, MD   08/05/2023 1:30 PM

## 2023-08-05 NOTE — Consult Note (Signed)
Chief Complaint: Patient was seen in consultation today for liver masses  Referring Physician(s): Gerarda Fraction, MD  Supervising Physician: Gilmer Mor  Patient Status: [facility] - In-pt  History of Present Illness: Alexis Garcia is a 62 y.o. female with PMH significant for abnormal uterine bleeding, cardiomegaly, carotid artery disease, COPD, diabetes mellitus, and hypertension being seen today in relation to newly discovered liver masses. Patient reports that she started having syncopal episodes over the past week before having a syncopal episode at a medical appointment that prompted patient to be brought to [facility] ED on 08/04/23. Patient found to be anemia. CT revealed concern for liver masses, pulmonary masses, and large cecal and inferior right colon mass. Patient seen by Dr Orlie Dakin from Oncology and IR was consulted for image-guided liver biopsy.   Past Medical History:  Diagnosis Date   Abnormal uterine bleeding    Asthma    Cardiomegaly    Cardiovascular disease    COPD (chronic obstructive pulmonary disease) (HCC)    Diabetes mellitus without complication (HCC)    Goiter, nontoxic, multinodular    Hypertension    Hypothyroidism    Thyrotoxicosis     Past Surgical History:  Procedure Laterality Date   CESAREAN SECTION     COLONOSCOPY WITH PROPOFOL N/A 10/29/2015   Procedure: COLONOSCOPY WITH PROPOFOL;  Surgeon: Elnita Maxwell, MD;  Location: [facility] ENDOSCOPY;  Service: Endoscopy;  Laterality: N/A;   DILITATION & CURRETTAGE/HYSTROSCOPY WITH NOVASURE ABLATION N/A 06/25/2018   Procedure: DILATATION & CURETTAGE/HYSTEROSCOPY WITH MINERVA ABLATION;  Surgeon: Hildred Laser, MD;  Location: [facility] ORS;  Service: Gynecology;  Laterality: N/A;    Allergies: Patient has no known allergies.  Medications: Prior to Admission medications   Medication Sig Start Date End Date Taking? Authorizing Provider  albuterol (PROVENTIL HFA;VENTOLIN HFA) 108 (90 Base) MCG/ACT inhaler Inhale  2 puffs into the lungs every 6 (six) hours as needed (for exercise induced asthma).   Yes [provider]  atorvastatin (LIPITOR) 10 MG tablet SMARTSIG:1 Tablet(s) By Mouth Every Evening 07/14/23  Yes [provider]  cholecalciferol (VITAMIN D) 400 units TABS tablet Take 400 Units by mouth daily.   Yes [provider]  ezetimibe (ZETIA) 10 MG tablet Take 10 mg by mouth daily. 03/22/19  Yes [provider]  hydrochlorothiazide (HYDRODIURIL) 25 MG tablet Take 25 mg by mouth daily.   Yes [provider]  levothyroxine (SYNTHROID) 75 MCG tablet Take 75 mcg by mouth daily. 05/27/23  Yes [provider]  lisinopril (ZESTRIL) 20 MG tablet Take 20 mg by mouth daily. 06/10/23  Yes [provider]  metFORMIN (GLUCOPHAGE) 500 MG tablet Take 500 mg by mouth 2 (two) times daily.    Yes [provider]  metoprolol tartrate (LOPRESSOR) 25 MG tablet Take 25 mg by mouth 2 (two) times daily.   Yes [provider]  Multiple Vitamins-Minerals (ALIVE ONCE DAILY WOMENS PO) Take 2 tablets by mouth daily. Gummies   Yes [provider]  RYBELSUS 7 MG TABS Take 1 tablet by mouth daily. 07/14/23  Yes [provider]  levothyroxine (SYNTHROID, LEVOTHROID) 88 MCG tablet Take 88 mcg by mouth daily before breakfast. Patient not taking: Reported on 08/05/2023    [provider]  lisinopril (PRINIVIL,ZESTRIL) 2.5 MG tablet Take 2.5 mg by mouth every evening.  Patient not taking: Reported on 08/05/2023    [provider]  Omega-3 Fatty Acids (FISH OIL) 1000 MG CAPS Take 1,000 mg by mouth daily. Patient not taking: Reported  on 08/05/2023    [provider]  phentermine (ADIPEX-P) 37.5 MG tablet Take 37.5 mg by mouth daily before breakfast. Patient not taking: Reported on 08/05/2023    [provider]  umeclidinium-vilanterol (ANORO ELLIPTA) 62.5-25 MCG/INH AEPB Inhale 1 puff into the lungs daily as  needed. Patient not taking: Reported on 08/05/2023    [provider]     Family History  Adopted: Yes  Problem Relation Age of Onset   Breast cancer Neg Hx     Social History   Socioeconomic History   Marital status: Divorced    Spouse name: Not on file   Number of children: Not on file   Years of education: Not on file   Highest education level: Not on file  Occupational History   Not on file  Tobacco Use   Smoking status: Every Day    Current packs/day: 0.50    Types: Cigarettes   Smokeless tobacco: Never  Vaping Use   Vaping status: Former  Substance and Sexual Activity   Alcohol use: Never   Drug use: Never   Sexual activity: Yes    Birth control/protection: None, Post-menopausal  Other Topics Concern   Not on file  Social History Narrative   Not on file   Social Determinants of Health   Financial Resource Strain: Not on file  Food Insecurity: No Food Insecurity (08/04/2023)   Hunger Vital Sign    Worried About Running Out of Food in the Last Year: Never true    Ran Out of Food in the Last Year: Never true  Transportation Needs: No Transportation Needs (08/04/2023)   PRAPARE - Administrator, Civil Service (Medical): No    Lack of Transportation (Non-Medical): No  Physical Activity: Not on file  Stress: Not on file  Social Connections: Not on file    Code Status: Full code  Review of Systems: A 12 point ROS discussed and pertinent positives are indicated in the HPI above.  All other systems are negative.  Review of Systems  Constitutional:  Positive for fever. Negative for chills.  Respiratory:  Negative for chest tightness and shortness of breath.   Cardiovascular:  Negative for chest pain and leg swelling.  Gastrointestinal:  Positive for diarrhea. Negative for abdominal pain, nausea and vomiting.  Neurological:  Positive for syncope and light-headedness. Negative for dizziness and headaches.  Psychiatric/Behavioral:  Negative for  confusion.     Vital Signs: BP 119/66 (BP Location: Left Arm)   Pulse 73   Temp 99.1 F (37.3 C)   Resp 18   Ht 5\' 6"  (1.676 m)   Wt 146 lb 13.2 oz (66.6 kg)   SpO2 100%   BMI 23.70 kg/m     Physical Exam Vitals reviewed.  Constitutional:      General: She is not in acute distress.    Appearance: She is ill-appearing.  HENT:     Mouth/Throat:     Mouth: Mucous membranes are moist.  Eyes:     Pupils: Pupils are equal, round, and reactive to light.  Cardiovascular:     Rate and Rhythm: Normal rate and regular rhythm.     Pulses: Normal pulses.     Heart sounds: Normal heart sounds.  Pulmonary:     Effort: Pulmonary effort is normal.     Breath sounds: Normal breath sounds.  Abdominal:     General: There is no distension.     Palpations: Abdomen is soft.  Tenderness: There is no abdominal tenderness.  Musculoskeletal:     Right lower leg: No edema.     Left lower leg: No edema.  Skin:    General: Skin is warm and dry.  Neurological:     Mental Status: She is alert and oriented to person, place, and time.  Psychiatric:        Mood and Affect: Mood normal.        Behavior: Behavior normal.        Thought Content: Thought content normal.        Judgment: Judgment normal.     Imaging: CT ABDOMEN PELVIS W CONTRAST  Result Date: 08/05/2023 CLINICAL DATA:  Bilateral pulmonary nodules and ill-defined areas of low density in the liver concerning for metastatic disease on a recent chest CTA. EXAM: CT ABDOMEN AND PELVIS WITH CONTRAST TECHNIQUE: Multidetector CT imaging of the abdomen and pelvis was performed using the standard protocol following bolus administration of intravenous contrast. RADIATION DOSE REDUCTION: This exam was performed according to the departmental dose-optimization program which includes automated exposure control, adjustment of the mA and/or kV according to patient size and/or use of iterative reconstruction technique. CONTRAST:  OMNIPAQUE  IOHEXOL 300 MG/ML  SOLN COMPARISON:  Chest CTA dated 08/04/2023. FINDINGS: Lower chest: Previously described lung nodules.  Normal-sized heart. Hepatobiliary: Multiple rounded and oval, heterogeneous, predominantly low-density masses throughout the liver. An index mass in the caudate lobe measures 6.6 x 4.0 cm on image number 22/2. Normal-appearing gallbladder. Pancreas: Unremarkable. No pancreatic ductal dilatation or surrounding inflammatory changes. Spleen: Normal in size without focal abnormality. Adrenals/Urinary Tract: Normal. Adrenal glands. Small bilateral simple appearing renal cysts. These do not need imaging follow-up. Unremarkable urinary bladder and visualized portions of the ureters. No hydronephrosis or urinary tract calculi. Stomach/Bowel: Large, lobulated, cecal and inferior right colon mass. This measures 6.6 x 5.0 cm on image number 56/2 and is causing high-grade luminal stenosis. This extends into the ileocecal valve and distal ileum with marked dilatation of the distal ileum with fecalized material in the lumen. There is some oral contrast which has passed through the lumen. The stomach and proximal small bowel are unremarkable. The appendix is diffusely enlarged. Vascular/Lymphatic: Multiple enlarged mesenteric and retroperitoneal nodes. An index left anterior para-aortic node has a short axis diameter of 15 mm on image number 47/2. Atheromatous celiac artery calcifications without aneurysm. Reproductive: Enlarged uterus containing multiple masses, 1 of which is densely calcified. The ovaries are not identified separate from these masses in there is some lobulated inferior pelvic fluid posteriorly, primarily on the left. Other: 3 ventral hernias containing hernia fat. 2.5 x 1.5 cm intramuscular lipoma anterior to the right hip. Musculoskeletal: Bilateral sacroiliac degenerative changes. Lumbar and lower thoracic spine degenerative changes with lower thoracic spine changes of DISH. No evidence  of bony metastatic disease. IMPRESSION: 1. Large cecal and inferior right colon mass extending into the ileocecal valve and distal ileum with high-grade luminal stenosis and partial distal small bowel obstruction. This is also extending into the appendix and is compatible with a primary colon carcinoma. 2. Multiple hepatic metastases. 3. Multiple metastatic mesenteric and retroperitoneal nodes. 4. Multiple pulmonary metastases. 5. Enlarged uterus containing multiple masses, 1 of which is densely calcified. The ovaries are not identified separate from these masses and there is some loculated inferior pelvic fluid posteriorly, primarily on the left. These findings are nonspecific and could be due to uterine fibroids and possible primary or metastatic ovarian neoplasms. 6. 3 ventral hernias containing  herniated fat. Electronically Signed   By: Beckie Salts M.D.   On: 08/05/2023 13:44   CT Angio Chest PE W and/or Wo Contrast  Result Date: 08/04/2023 CLINICAL DATA:  Syncope.  Hypotension. EXAM: CT ANGIOGRAPHY CHEST WITH CONTRAST TECHNIQUE: Multidetector CT imaging of the chest was performed using the standard protocol during bolus administration of intravenous contrast. Multiplanar CT image reconstructions and MIPs were obtained to evaluate the vascular anatomy. RADIATION DOSE REDUCTION: This exam was performed according to the departmental dose-optimization program which includes automated exposure control, adjustment of the mA and/or kV according to patient size and/or use of iterative reconstruction technique. CONTRAST:  75mL OMNIPAQUE IOHEXOL 350 MG/ML SOLN COMPARISON:  None Available. FINDINGS: Cardiovascular: Satisfactory opacification of the pulmonary arteries to the segmental level. No evidence of pulmonary embolism. Normal heart size. No pericardial effusion. Mediastinum/Nodes: No enlarged mediastinal, hilar, or axillary lymph nodes. Thyroid gland, trachea, and esophagus demonstrate no significant findings.  Lungs/Pleura: No pneumothorax or pleural effusion is noted. Multiple pulmonary nodules are noted bilaterally, predominantly on the left side. The largest nodule measures 1.4 cm in left lower lobe. 10 mm irregular nodule is noted in left upper lobe. 4 mm nodule is noted in right upper lobe. These are concerning for metastatic disease. Upper Abdomen: Multiple ill-defined large low densities are noted in the hepatic parenchyma highly concerning for metastatic disease. Musculoskeletal: No chest wall abnormality. No acute or significant osseous findings. Review of the MIP images confirms the above findings. IMPRESSION: Bilateral pulmonary nodules are noted, predominantly on the left, concerning for metastatic disease. Also noted are multiple ill-defined large low densities in the hepatic parenchyma concerning for metastatic disease is well. PET scan is recommended for further evaluation. No definite evidence of pulmonary embolus. Electronically Signed   By: Lupita Raider M.D.   On: 08/04/2023 16:18    Labs:  CBC: Recent Labs    08/04/23 1153 08/05/23 0623  WBC 6.6 5.6  HGB 8.3* 7.8*  HCT 25.9* 24.4*  PLT 525* 620*    COAGS: Recent Labs    08/05/23 0623  INR 1.3*    BMP: Recent Labs    08/04/23 1153 08/05/23 0623  NA 137 137  K 3.8 3.2*  CL 102 102  CO2 20* 25  GLUCOSE 81 78  BUN 26* 26*  CALCIUM 8.0* 8.0*  CREATININE 0.96 0.82  GFRNONAA >60 >60    LIVER FUNCTION TESTS: Recent Labs    08/04/23 1153  BILITOT 1.6*  AST 45*  ALT 29  ALKPHOS 96  PROT 6.9  ALBUMIN 2.7*    TUMOR MARKERS: No results for input(s): "AFPTM", "CEA", "CA199", "CHROMGRNA" in the last 8760 hours.  Assessment and Plan:  Lazariah Scollon is a 62 yo female being seen today in relation to newly discovered liver masses. Patient with likely colon cancer per CT findings from 08/05/23, but a biopsy is required to further provide a tissue diagnosis. Case reviewed and approved by Dr Loreta Ave. Patient will be made  NPO at midnight, does not take blood thinners. CBC ordered for AM, recent INR from 9/25 of 1.3. Hgb 7.8.   Risks and benefits of image-guided liver biopsy was discussed with the patient and/or patient's family including, but not limited to bleeding, infection, damage to adjacent structures or low yield requiring additional tests.  All of the questions were answered and there is agreement to proceed.  Consent signed and in chart.   Thank you for this interesting consult.  I greatly enjoyed meeting Ebonique F  Geffert and look forward to participating in their care.  A copy of this report was sent to the requesting provider on this date.  Electronically Signed: Kennieth Francois, PA-C 08/05/2023, 3:46 PM   I spent a total of 20 Minutes  in face to face in clinical consultation, greater than 50% of which was counseling/coordinating care for liver masses.

## 2023-08-05 NOTE — Progress Notes (Signed)
Progress Note   Patient: Alexis Garcia QMV:[id number] DOB: June 11, 1961 DOA: 08/04/2023     0 DOS: the patient was seen and examined on 08/05/2023     Subjective:  Patient seen and examined at bedside this morning He was walking around with physical therapist Denied chest pain headache nausea or vomiting    Brief hospital course:  From HPI " Alexis Garcia is a 62 y.o. female with medical history significant of type 2 diabetes, hypertension, hypothyroidism, multinodular goiter, COPD/asthma, carotid artery disease, who presents to the ED due to syncope.   Alexis Garcia states that she was placed on Rybelsus over 1 year ago and since then, has lost over 100 pounds. She also has been experiencing daily nausea and vomiting that she attributes to changes in taste that occurred when she started Rybelsus as well.  Then last week, she went on a cruise and had 2 syncopal episodes.  The first occurred when she was sitting down and felt unwell and was told by others that she was unresponsive.  The medical team on the cruise evaluated her and told her her blood pressure was very low and to stop her lisinopril.  She did so as instructed however 2 days later, when standing up from a chair she lost consciousness and fell forward hitting her head onto a pole.  Since the second fall, she has also discontinued hydrochlorothiazide.  She continues to take metoprolol daily.  She denies any palpitations, chest pain, shortness of breath.  She occasionally experiences left lower quadrant abdominal pain but no changes in stool caliber.   Alexis Garcia states she smoked cigarettes for 20 years total and quit 3 months ago.    ED course: On arrival to the ED, patient's blood pressure was on the lower end of normal at 99/66 with heart rate of 59.  She was saturating at 94% on room air.  She was afebrile at 98.4. Initial workup notable for hemoglobin of 8.3 with MCV of 84.4, bicarb 20, BUN 26, creatinine 0.96, albumin 2.7, AST 45, total  bilirubin 1.6 and GFR above 60.  Troponin negative.  D-dimer elevated at 5.8 so a CTA was obtained.  Results notable for bilateral pulmonary nodules predominantly on the left in addition to multiple ill-defined densities in the hepatic parenchyma concerning for metastatic disease.  No PE noted.  Due to recurrent syncopal episodes, [facility] contacted for admission.  "      Assessment and Plan:  Syncope likely secondary to underlying metastatic colon cancer Patient has had 3 syncopal episode in the past 1 week,    EKG with no concerning findings, however of note sinus bradycardia with no AV block.  In light of severe anemia and poor p.o. intake, strongly suspect orthostatic hypotension, however notable murmur with no previous echo.   - Telemetry monitoring - Orthostatic vital signs - Follow-up on echocardiogram - Hold home antihypertensives   Normocytic anemia secondary to underlying colonic mass Patient has a historically normal hemoglobin previously 13.7 in January 2023.Her PCP has been evaluating her for severe anemia of unknown etiology.  Hemoglobin currently 8.3.   - Repeat CBC in a.m. - Transfuse for hemoglobin less than 7 - Iron panel pending   Colonic mass  with metastatic disease (HCC) Per chart review, patient was started on semaglutide about 1 year ago and had massive weight loss.  Since February 2024, she is lost about 36 pounds per epic.  In light of this, she has had a negative mammogram in  October 2023.  Colposcopy in February 2024 appears with no evidence of malignancy or dysplasia.    CT scan of the abdomen and pelvic done today reviewed showing large sacral and inferior colonic mass extending into the ileocecal valve with distal ileum with high-grade luminal stenosis and partial distal small bowel obstruction.  Multiple hepatic metastasis, pulmonary metastasis, enlarged uterus containing multiple masses - Oncology is on board and case discussed - CT scan of the abdomen and  pelvics showed findings of colonic mass as the primary with mets to the liver and lungs Gastroenterology Dr. Norma Fredrickson have been consulted  Essential hypertension - Given blood pressure on the lower end of normal, hold home antihypertensives - Follow-up on orthostatic vitals   Diabetes (HCC) - Continue to hold oral hypoglycemic agents - Follow-up on A1c levels - SSI, sensitive   Advance Care Planning:   Code Status: Full Code    Consults: Oncology   Family Communication: Patient's daughter updated at bedside    Physical Exam:  Vitals and nursing note reviewed.  Constitutional:      General: She is not in acute distress.    Appearance: She is normal weight.  HENT:     Head: Normocephalic.     Comments: Bandage on the left eyebrow Eyes:     Conjunctiva/sclera: Conjunctivae normal.     Pupils: Pupils are equal, round, and reactive to light.  Cardiovascular:     Rate and Rhythm: Normal rate and regular rhythm.     Heart sounds: Systolic murmur head Pulmonary:     Effort: Pulmonary effort is normal. No respiratory distress.     Breath sounds: Normal breath sounds. No wheezing, rhonchi or rales.  Abdominal:     General: Bowel sounds are normal. There is no distension.     Palpations: Abdomen is soft.     Tenderness: There is no abdominal tenderness. There is no guarding.  Musculoskeletal:     Right lower leg: No edema.     Left lower leg: No edema.  Skin:    General: Skin is warm and dry.  Neurological:     General: No focal deficit present.     Mental Status: She is alert and oriented to person, place, and time. Mental status is at baseline.  Psychiatric:        Mood and Affect: Mood normal.        Behavior: Behavior normal.        Thought Content: Thought content normal.      Vitals:   08/05/23 0000 08/05/23 0402 08/05/23 0756 08/05/23 1115  BP: 118/64 (!) 101/59 118/70 119/66  Pulse: (!) 58 61 (!) 55 73  Resp: 18 18 18 18   Temp: 98.1 F (36.7 C) 98.2 F (36.8  C) (!) 97.3 F (36.3 C) 99.1 F (37.3 C)  TempSrc: Oral Oral    SpO2: 100% 100% 90% 100%  Weight:      Height:        Data Reviewed: I have reviewed patient's labs including CBC, BMP as shown below as well as CT scan report as shown below CT scan of the abdomen and pelvic done today reviewed showing large sacral and inferior colonic mass extending into the ileocecal valve with distal ileum with high-grade luminal stenosis and partial distal small bowel obstruction.  Multiple hepatic metastasis, pulmonary metastasis, enlarged uterus containing multiple masses     Latest Ref Rng & Units 08/05/2023    6:23 AM 08/04/2023   11:53 AM 11/22/2021  9:12 AM  CBC  WBC 4.0 - 10.5 K/uL 5.6  6.6  4.8   Hemoglobin 12.0 - 15.0 g/dL 7.8  8.3  41.3   Hematocrit 36.0 - 46.0 % 24.4  25.9  40.3   Platelets 150 - 400 K/uL 620  525  240        Latest Ref Rng & Units 08/05/2023    6:23 AM 08/04/2023   11:53 AM 09/13/2021    8:57 AM  BMP  Glucose 70 - 99 mg/dL 78  81  95   BUN 6 - 20 mg/dL 26  26  17    Creatinine 0.44 - 1.00 mg/dL 2.44  0.10  2.72   BUN/Creat Ratio 9 - 23   21   Sodium 135 - 145 mmol/L 137  137  141   Potassium 3.5 - 5.1 mmol/L 3.2  3.8  4.5   Chloride 98 - 111 mmol/L 102  102  102   CO2 22 - 32 mmol/L 25  20  24    Calcium 8.9 - 10.3 mg/dL 8.0  8.0  9.1      Family Communication: No family present at bedside  Disposition: Possibly home when medically stable   Time spent: 45 minutes  Author: Loyce Dys, MD 08/05/2023 4:24 PM  For on call review [email or web address].

## 2023-08-05 NOTE — Evaluation (Signed)
Occupational Therapy Evaluation Patient Details Name: Alexis Garcia MRN: [id number] DOB: 1961/10/04 Today's Date: 08/05/2023   History of Present Illness Alexis Garcia is a 62 y.o. female with medical history significant of type 2 diabetes, hypertension, hypothyroidism, multinodular goiter, COPD/asthma, carotid artery disease, who presents to the ED due to syncope.   Clinical Impression   Upon entering the room, pt seated in hospital bed and agreeable to OT intervention. Pt reports she lives at home and is Ind in all aspects of care and works full time at [employer] in environmental services. Pt endorses fatigue for quite a while. Pt performs bed mobility without assistance and stands with RW at mod I level. Pt ambulates 200' with RW without physical assistance this session. She declines toileting needs. OT discussed home set up and energy conservation education for home and within the community. Pt verbalized understanding and was active participant in discussion. We did discuss shower equipment at home for energy conservation and to decrease fall risk. Pt does not need further skilled OT intervention at this time.          Functional Status Assessment  Patient has not had a recent decline in their functional status  Equipment Recommendations  Tub/shower seat       Precautions / Restrictions Precautions Precautions: Other (comment);Fall (syncope) Restrictions Weight Bearing Restrictions: No      Mobility Bed Mobility Overal bed mobility: Modified Independent                  Transfers Overall transfer level: Modified independent Equipment used: Rolling walker (2 wheels)                      Balance Overall balance assessment: Modified Independent                                         ADL either performed or assessed with clinical judgement   ADL Overall ADL's : Modified independent                                              Vision Patient Visual Report: No change from baseline              Pertinent Vitals/Pain Pain Assessment Pain Assessment: No/denies pain     Extremity/Trunk Assessment Upper Extremity Assessment Upper Extremity Assessment: Overall WFL for tasks assessed   Lower Extremity Assessment Lower Extremity Assessment: Overall WFL for tasks assessed   Cervical / Trunk Assessment Cervical / Trunk Assessment: Normal   Communication Communication Communication: No apparent difficulties   Cognition Arousal: Alert Behavior During Therapy: WFL for tasks assessed/performed Overall Cognitive Status: Within Functional Limits for tasks assessed                                       General Comments  Assessed Orthostatic Vitals prior to mobility. Supine: 114/69, Seated: 119/79, Standing: 126/72. Patient denied symptoms of lightheadedness with completion.            Home Living Family/patient expects to be discharged to:: Private residence Living Arrangements: Children Available Help at Discharge: Family Type of Home: House Home Access: Stairs to enter [employer]  of Steps: 3 Entrance Stairs-Rails: Can reach both;Right;Left Home Layout: One level               Home Equipment: None          Prior Functioning/Environment Prior Level of Function : Independent/Modified Independent;Working/employed;Driving             Mobility Comments: Reports IND with all aspects of mobility, no AD use. Was workijng full time at [employer] in [place]. Patient was driving. Does report some decrease in energy level over last few weeks. ADLs Comments: IND with ADLs/IADLs                 OT Goals(Current goals can be found in the care plan section) Acute Rehab OT Goals Patient Stated Goal: to go home OT Goal Formulation: With patient Time For Goal Achievement: 08/05/23 Potential to Achieve Goals: Fair  OT Frequency:         AM-PAC OT "6  Clicks" Daily Activity     Outcome Measure Help from another person eating meals?: None Help from another person taking care of personal grooming?: None Help from another person toileting, which includes using toliet, bedpan, or urinal?: None Help from another person bathing (including washing, rinsing, drying)?: None Help from another person to put on and taking off regular upper body clothing?: None Help from another person to put on and taking off regular lower body clothing?: None 6 Click Score: 24   End of Session Equipment Utilized During Treatment: Rolling walker (2 wheels) Nurse Communication: Mobility status  Activity Tolerance: Patient tolerated treatment well Patient left: in bed;with call bell/phone within reach;with bed alarm set                   Time: 5852-7782 OT Time Calculation (min): 16 min Charges:  OT General Charges $OT Visit: 1 Visit OT Evaluation $OT Eval Low Complexity: 1 Low OT Treatments $Therapeutic Activity: 8-22 mins Jackquline Denmark, MS, OTR/L , CBIS ascom [phone]  08/05/23, 11:41 AM

## 2023-08-05 NOTE — TOC CM/SW Note (Signed)
Transition of Care [facility]) - Inpatient Brief Assessment   Patient Details  Name: PATINA MCCARTHA MRN: [id number] Date of Birth: July 16, 1961  Transition of [facility]) CM/SW Contact:    Allena Katz, LCSW Phone Number: 08/05/2023, 11:19 AM   Clinical Narrative:    Transition of Care Asessment: Insurance and Status: Insurance coverage has been reviewed Patient has primary care physician: Yes Home environment has been reviewed: [address] [place] [address] Prior level of function:: PT/OT evaluated. no PT/OT needs. Prior/Current Home Services: No current home services Social Determinants of Health Reivew: SDOH reviewed no interventions necessary Readmission risk has been reviewed: Yes Transition of care needs: no transition of care needs at this time

## 2023-08-06 ENCOUNTER — Observation Stay: Payer: BC Managed Care – PPO

## 2023-08-06 ENCOUNTER — Encounter: Payer: Self-pay | Admitting: Anesthesiology

## 2023-08-06 ENCOUNTER — Encounter
Admission: EM | Disposition: A | Discharge status: Home / Self Care | Payer: Self-pay | Source: Home / Self Care | Attending: Emergency Medicine

## 2023-08-06 DIAGNOSIS — R55 Syncope and collapse: Secondary | ICD-10-CM | POA: Diagnosis not present

## 2023-08-06 DIAGNOSIS — R918 Other nonspecific abnormal finding of lung field: Secondary | ICD-10-CM | POA: Diagnosis not present

## 2023-08-06 LAB — GLUCOSE, CAPILLARY
Glucose-Capillary: 91 mg/dL (ref 70–99)
Glucose-Capillary: 88 mg/dL (ref 70–99)
Glucose-Capillary: 88 mg/dL (ref 70–99)
Glucose-Capillary: 155 mg/dL — ABNORMAL HIGH (ref 70–99)
Glucose-Capillary: 111 mg/dL — ABNORMAL HIGH (ref 70–99)

## 2023-08-06 LAB — CBC WITH DIFFERENTIAL/PLATELET
Abs Immature Granulocytes: 0.04 10*3/uL (ref 0.00–0.07)
Basophils Absolute: 0 10*3/uL (ref 0.0–0.1)
Basophils Relative: 0 %
Eosinophils Absolute: 0.1 10*3/uL (ref 0.0–0.5)
Eosinophils Relative: 2 %
HCT: 26.1 % — ABNORMAL LOW (ref 36.0–46.0)
Hemoglobin: 8.3 g/dL — ABNORMAL LOW (ref 12.0–15.0)
Immature Granulocytes: 1 %
Lymphocytes Relative: 19 %
Lymphs Abs: 1.1 10*3/uL (ref 0.7–4.0)
MCH: 26.6 pg (ref 26.0–34.0)
MCHC: 31.8 g/dL (ref 30.0–36.0)
MCV: 83.7 fL (ref 80.0–100.0)
Monocytes Absolute: 0.6 10*3/uL (ref 0.1–1.0)
Monocytes Relative: 10 %
Neutro Abs: 3.8 10*3/uL (ref 1.7–7.7)
Neutrophils Relative %: 68 %
Platelets: 610 10*3/uL — ABNORMAL HIGH (ref 150–400)
RBC: 3.12 MIL/uL — ABNORMAL LOW (ref 3.87–5.11)
RDW: 14.8 % (ref 11.5–15.5)
WBC: 5.6 10*3/uL (ref 4.0–10.5)
nRBC: 0 % (ref 0.0–0.2)

## 2023-08-06 LAB — BASIC METABOLIC PANEL
Anion gap: 6 (ref 5–15)
BUN: 20 mg/dL (ref 6–20)
CO2: 25 mmol/L (ref 22–32)
Calcium: 7.9 mg/dL — ABNORMAL LOW (ref 8.9–10.3)
Chloride: 104 mmol/L (ref 98–111)
Creatinine, Ser: 0.86 mg/dL (ref 0.44–1.00)
GFR, Estimated: >60 mL/min (ref >60)
Glucose, Bld: 104 mg/dL — ABNORMAL HIGH (ref 70–99)
Potassium: 4.4 mmol/L (ref 3.5–5.1)
Sodium: 135 mmol/L (ref 135–145)

## 2023-08-06 LAB — CEA: CEA: 1440 ng/mL — ABNORMAL HIGH (ref 0.0–4.7)

## 2023-08-06 SURGERY — COLONOSCOPY
Anesthesia: General

## 2023-08-06 MED ORDER — FENTANYL CITRATE (PF) 100 MCG/2ML IJ SOLN
INTRAMUSCULAR | Status: AC | PRN
Start: 2023-08-06 — End: 2023-08-06
  Administered 2023-08-06: 50 ug via INTRAVENOUS
  Administered 2023-08-06: 25 ug via INTRAVENOUS

## 2023-08-06 MED ORDER — SODIUM CHLORIDE 0.9 % IV SOLN: INTRAVENOUS | Status: DC

## 2023-08-06 MED ORDER — FENTANYL CITRATE (PF) 100 MCG/2ML IJ SOLN
INTRAMUSCULAR | Status: AC
  Filled 2023-08-06: qty 2

## 2023-08-06 MED ORDER — PNEUMOCOCCAL 20-VAL CONJ VACC 0.5 ML IM SUSY
0.5000 mL | PREFILLED_SYRINGE | INTRAMUSCULAR | Status: DC
  Filled 2023-08-06: qty 0.5

## 2023-08-06 MED ORDER — MIDAZOLAM HCL 2 MG/2ML IJ SOLN
INTRAMUSCULAR | Status: AC | PRN
Start: 2023-08-06 — End: 2023-08-06
  Administered 2023-08-06: 1 mg via INTRAVENOUS

## 2023-08-06 MED ORDER — INFLUENZA VIRUS VACC SPLIT PF (FLUZONE) 0.5 ML IM SUSY: 0.5000 mL | PREFILLED_SYRINGE | INTRAMUSCULAR | Status: DC

## 2023-08-06 MED ORDER — LIDOCAINE HCL (PF) 1 % IJ SOLN
10.0000 mL | Freq: Once | INTRAMUSCULAR | Status: AC
  Administered 2023-08-06: 10 mL via INTRADERMAL
  Filled 2023-08-06: qty 10

## 2023-08-06 MED ORDER — MIDAZOLAM HCL 2 MG/2ML IJ SOLN
INTRAMUSCULAR | Status: AC
  Filled 2023-08-06: qty 2

## 2023-08-06 NOTE — Plan of Care (Signed)

## 2023-08-06 NOTE — Progress Notes (Signed)
Pt received from procedure via bed in stable condition.she is alert and oriented X 4. Dressing looks clean dry and intact.

## 2023-08-06 NOTE — Progress Notes (Signed)
Pt sent for procedure via bed in stable condition.

## 2023-08-06 NOTE — Progress Notes (Signed)
Progress Note   Patient: Alexis Garcia NWG:[id number] DOB: 15-Dec-1960 DOA: 08/04/2023     0 DOS: the patient was seen and examined on 08/06/2023      Subjective:  Patient seen and examined at bedside this morning Denies nausea vomiting abdominal pain chest pain or cough Patient underwent liver biopsy today    Brief hospital course:   From HPI " Alexis Garcia is a 62 y.o. female with medical history significant of type 2 diabetes, hypertension, hypothyroidism, multinodular goiter, COPD/asthma, carotid artery disease, who presents to the ED due to syncope.   Alexis Garcia states that she was placed on Rybelsus over 1 year ago and since then, has lost over 100 pounds. She also has been experiencing daily nausea and vomiting that she attributes to changes in taste that occurred when she started Rybelsus as well.  Then last week, she went on a cruise and had 2 syncopal episodes.  The first occurred when she was sitting down and felt unwell and was told by others that she was unresponsive.  The medical team on the cruise evaluated her and told her her blood pressure was very low and to stop her lisinopril.  She did so as instructed however 2 days later, when standing up from a chair she lost consciousness and fell forward hitting her head onto a pole.  Since the second fall, she has also discontinued hydrochlorothiazide.  She continues to take metoprolol daily.  She denies any palpitations, chest pain, shortness of breath.  She occasionally experiences left lower quadrant abdominal pain but no changes in stool caliber.   Alexis Garcia states she smoked cigarettes for 20 years total and quit 3 months ago.    ED course: On arrival to the ED, Alexis blood pressure was on the lower end of normal at 99/66 with heart rate of 59.  She was saturating at 94% on room air.  She was afebrile at 98.4. Initial workup notable for hemoglobin of 8.3 with MCV of 84.4, bicarb 20, BUN 26, creatinine 0.96, albumin 2.7, AST 45,  total bilirubin 1.6 and GFR above 60.  Troponin negative.  D-dimer elevated at 5.8 so a CTA was obtained.  Results notable for bilateral pulmonary nodules predominantly on the left in addition to multiple ill-defined densities in the hepatic parenchyma concerning for metastatic disease.  No PE noted.  Due to recurrent syncopal episodes, TRH contacted for admission.  "         Assessment and Plan:   Syncope likely secondary to underlying metastatic colon cancer Patient has had 3 syncopal episode in the past 1 week,    EKG with no concerning findings, however of note sinus bradycardia with no AV block.  In light of severe anemia and poor p.o. intake, strongly suspect orthostatic hypotension, however notable murmur with no previous echo.   - Telemetry monitoring - Monitor blood pressure closely - Hold home antihypertensives   Normocytic anemia secondary to underlying colonic mass Patient has a historically normal hemoglobin previously 13.7 in January 2023.Her PCP has been evaluating her for severe anemia of unknown etiology.  Hemoglobin currently 8.3.   - Continue to monitor CBC - Transfuse for hemoglobin less than 7 - Iron panel reviewed   Colonic mass  with metastatic disease (HCC) Per chart review, patient was started on semaglutide about 1 year ago and had massive weight loss.  Since February 2024, she is lost about 36 pounds per epic.  In light of this, she has had a  negative mammogram in October 2023.  Colposcopy in February 2024 appears with no evidence of malignancy or dysplasia.    CT scan of the abdomen and pelvic reviewed showing large sacral and inferior colonic mass extending into the ileocecal valve with distal ileum with high-grade luminal stenosis and partial distal small bowel obstruction.  Multiple hepatic metastasis, pulmonary metastasis, enlarged uterus containing multiple masses - Oncology is on board and case discussed - CT scan of the abdomen and pelvics showed findings  of colonic mass as the primary with mets to the liver and lungs Gastroenterology Dr. Norma Fredrickson on board and case discussed and plans to have outpatient colonoscopy next week Patient underwent liver biopsy today and will continue to monitor hemodynamically for possible discharge tomorrow   Essential hypertension - Given blood pressure on the lower end of normal, hold home antihypertensives - Follow-up on orthostatic vitals   Diabetes (HCC) - Continue to hold oral hypoglycemic agents - Repeat A1c 6.0 however this was 7.01-year ago - Continue SSI, sensitive   Advance Care Planning:   Code Status: Full Code    Consults: Oncology   Family Communication: Alexis Garcia updated at bedside       Physical Exam:   Vitals and nursing note reviewed.  Constitutional:      General: She is not in acute distress.    Appearance: She is normal weight.  HENT:     Head: Normocephalic.     Comments: Bandage on the left eyebrow Eyes:     Conjunctiva/sclera: Conjunctivae normal.     Pupils: Pupils are equal, round, and reactive to light.  Cardiovascular:     Rate and Rhythm: Normal rate and regular rhythm.     Heart sounds: Systolic murmur head Pulmonary:     Effort: Pulmonary effort is normal. No respiratory distress.     Breath sounds: Normal breath sounds. No wheezing, rhonchi or rales.  Abdominal:     General: Bowel sounds are normal. There is no distension.     Palpations: Abdomen is soft.     Tenderness: There is no abdominal tenderness. There is no guarding.  Musculoskeletal:     Right lower leg: No edema.     Left lower leg: No edema.  Skin:    General: Skin is warm and dry.  Neurological:     General: No focal deficit present.     Mental Status: She is alert and oriented to person, place, and time. Mental status is at baseline.  Psychiatric:        Mood and Affect: Mood normal.        Behavior: Behavior normal.        Thought Content: Thought content normal.         Data  Reviewed:  I have reviewed Alexis lab results including CBC, BMP as shown below as well as Alexis vitals, I have also reviewed oncology documentation as well as gastroenterologist documentation and case discussed     Latest Ref Rng & Units 08/06/2023    5:17 AM 08/05/2023    6:23 AM 08/04/2023   11:53 AM  CBC  WBC 4.0 - 10.5 K/uL 5.6  5.6  6.6   Hemoglobin 12.0 - 15.0 g/dL 8.3  7.8  8.3   Hematocrit 36.0 - 46.0 % 26.1  24.4  25.9   Platelets 150 - 400 K/uL 610  620  525        Latest Ref Rng & Units 08/06/2023    5:17 AM 08/05/2023  6:23 AM 08/04/2023   11:53 AM  BMP  Glucose 70 - 99 mg/dL 562  78  81   BUN 6 - 20 mg/dL 20  26  26    Creatinine 0.44 - 1.00 mg/dL 1.30  8.65  7.84   Sodium 135 - 145 mmol/L 135  137  137   Potassium 3.5 - 5.1 mmol/L 4.4  3.2  3.8   Chloride 98 - 111 mmol/L 104  102  102   CO2 22 - 32 mmol/L 25  25  20    Calcium 8.9 - 10.3 mg/dL 7.9  8.0  8.0     Vitals:   08/06/23 1230 08/06/23 1245 08/06/23 1300 08/06/23 1317  BP: 115/70 111/70 108/66 107/67  Pulse: 66 73 74 70  Resp: (!) 24 18 (!) 21 18  Temp:    97.8 F (36.6 C)  TempSrc:      SpO2: 97% 97% 94% 95%  Weight:      Height:         Author: Loyce Dys, MD 08/06/2023 2:21 PM  For on call review [email or web address].

## 2023-08-06 NOTE — Procedures (Signed)
Interventional Radiology Procedure Note  Procedure: US Guided Biopsy of right lobe liver mass  Complications: None  Estimated Blood Loss: < 10 mL  Findings: 18 G core biopsy of 5.5 cm right lobe liver lesion performed under US guidance.  Three core samples obtained and sent to Pathology.  Jodi Marble. Fredia Sorrow, M.D Pager:  [phone]

## 2023-08-07 DIAGNOSIS — R918 Other nonspecific abnormal finding of lung field: Secondary | ICD-10-CM | POA: Diagnosis not present

## 2023-08-07 DIAGNOSIS — C189 Malignant neoplasm of colon, unspecified: Secondary | ICD-10-CM | POA: Diagnosis not present

## 2023-08-07 DIAGNOSIS — R55 Syncope and collapse: Secondary | ICD-10-CM | POA: Diagnosis not present

## 2023-08-07 LAB — URINALYSIS, COMPLETE (UACMP) WITH MICROSCOPIC
Bilirubin Urine: NEGATIVE
Glucose, UA: NEGATIVE mg/dL
Hgb urine dipstick: NEGATIVE
Ketones, ur: NEGATIVE mg/dL
Nitrite: NEGATIVE
Protein, ur: NEGATIVE mg/dL
Specific Gravity, Urine: 1.017 (ref 1.005–1.030)
pH: 5 (ref 5.0–8.0)

## 2023-08-07 LAB — BASIC METABOLIC PANEL
Anion gap: 8 (ref 5–15)
BUN: 16 mg/dL (ref 6–20)
CO2: 26 mmol/L (ref 22–32)
Calcium: 8.3 mg/dL — ABNORMAL LOW (ref 8.9–10.3)
Chloride: 104 mmol/L (ref 98–111)
Creatinine, Ser: 0.79 mg/dL (ref 0.44–1.00)
GFR, Estimated: >60 mL/min (ref >60)
Glucose, Bld: 110 mg/dL — ABNORMAL HIGH (ref 70–99)
Potassium: 5 mmol/L (ref 3.5–5.1)
Sodium: 138 mmol/L (ref 135–145)

## 2023-08-07 LAB — CBC WITH DIFFERENTIAL/PLATELET
Abs Immature Granulocytes: 0.05 10*3/uL (ref 0.00–0.07)
Basophils Absolute: 0 10*3/uL (ref 0.0–0.1)
Basophils Relative: 0 %
Eosinophils Absolute: 0 10*3/uL (ref 0.0–0.5)
Eosinophils Relative: 0 %
HCT: 27 % — ABNORMAL LOW (ref 36.0–46.0)
Hemoglobin: 8.6 g/dL — ABNORMAL LOW (ref 12.0–15.0)
Immature Granulocytes: 1 %
Lymphocytes Relative: 14 %
Lymphs Abs: 1.1 10*3/uL (ref 0.7–4.0)
MCH: 26.8 pg (ref 26.0–34.0)
MCHC: 31.9 g/dL (ref 30.0–36.0)
MCV: 84.1 fL (ref 80.0–100.0)
Monocytes Absolute: 0.8 10*3/uL (ref 0.1–1.0)
Monocytes Relative: 9 %
Neutro Abs: 6.1 10*3/uL (ref 1.7–7.7)
Neutrophils Relative %: 76 %
Platelets: 603 10*3/uL — ABNORMAL HIGH (ref 150–400)
RBC: 3.21 MIL/uL — ABNORMAL LOW (ref 3.87–5.11)
RDW: 15.2 % (ref 11.5–15.5)
WBC: 8.1 10*3/uL (ref 4.0–10.5)
nRBC: 0 % (ref 0.0–0.2)

## 2023-08-07 LAB — GLUCOSE, CAPILLARY
Glucose-Capillary: 99 mg/dL (ref 70–99)
Glucose-Capillary: 151 mg/dL — ABNORMAL HIGH (ref 70–99)

## 2023-08-07 LAB — SURGICAL PATHOLOGY

## 2023-08-07 MED ORDER — POLYETHYLENE GLYCOL 3350 17 G PO PACK: 17.0000 g | PACK | Freq: Every day | ORAL | 0 refills | Status: DC | PRN

## 2023-08-07 NOTE — Discharge Summary (Signed)
Physician Discharge Summary   Patient: Alexis Garcia MRN: [id number] DOB: Oct 01, 1961  Admit date:     08/04/2023  Discharge date: 08/07/23  Discharge Physician: Loyce Dys   PCP: Armando Gang, FNP   Recommendations at discharge:  Follow-up with oncology  Discharge Diagnoses: Syncope likely secondary to underlying metastatic colon cancer Normocytic anemia secondary to underlying colonic mass Colonic mass  with metastatic disease (HCC) Essential hypertension Diabetes [facility])   Hospital Course: Alexis Garcia is a 62 y.o. female with medical history significant of type 2 diabetes, hypertension, hypothyroidism, multinodular goiter, COPD/asthma, carotid artery disease, who presents to the ED due to syncope.   Alexis Garcia states that she was placed on Rybelsus over 1 year ago and since then, has lost over 100 pounds. She also has been experiencing daily nausea and vomiting that she attributes to changes in taste that occurred when she started Rybelsus as well.  Then last week, she went on a cruise and had 2 syncopal episodes.  The first occurred when she was sitting down and felt unwell and was told by others that she was unresponsive.  The medical team on the cruise evaluated her and told her her blood pressure was very low and to stop her lisinopril.  She did so as instructed however 2 days later, when standing up from a chair she lost consciousness and fell forward hitting her head onto a pole.  Since the second fall, she has also discontinued hydrochlorothiazide.  She continues to take metoprolol daily.  She denies any palpitations, chest pain, shortness of breath.  She occasionally experiences left lower quadrant abdominal pain but no changes in stool caliber.   Alexis Garcia states she smoked cigarettes for 20 years total and quit 3 months ago.    ED course: On arrival to the ED, patient's blood pressure was on the lower end of normal at 99/66 with heart rate of 59.  She was saturating at 94%  on room air.  She was afebrile at 98.4. Initial workup notable for hemoglobin of 8.3 with MCV of 84.4, bicarb 20, BUN 26, creatinine 0.96, albumin 2.7, AST 45, total bilirubin 1.6 and GFR above 60.  Troponin negative.  D-dimer elevated at 5.8 so a CTA was obtained.  Results notable for bilateral pulmonary nodules predominantly on the left in addition to multiple ill-defined densities in the hepatic parenchyma concerning for metastatic disease.  No PE noted.  Due to recurrent syncopal episodes, [facility] contacted for admission.  Patient underwent abdominal imaging which found colonic mass with mass to the liver and the lungs.  Patient was seen by GI, oncologist as well as interventional radiologist and underwent biopsy of the liver mets.  Patient is scheduled for outpatient colonoscopy next week and also to follow-up with oncologist Dr. Orlie Dakin.  She has been seen by PT OT and no longer have any lightheadedness.  She did have an episode of low-grade fever however no symptoms of UTI, gastroenteritis or pneumonia detected.  Patient admits to having increased her room temperature so high which likely contributed to the minimal fever.  With regulation of patient's room temperature her fever is resolved.  Patient is therefore being discharged home to follow-up with her outpatient physicians.          Consultants: As mentioned above Procedures performed: As mentioned above Disposition: Home Diet recommendation:  Cardiac diet DISCHARGE MEDICATION: Allergies as of 08/07/2023   No Known Allergies      Medication List  STOP taking these medications    Fish Oil 1000 MG Caps   hydrochlorothiazide 25 MG tablet Commonly known as: HYDRODIURIL   lisinopril 2.5 MG tablet Commonly known as: ZESTRIL   lisinopril 20 MG tablet Commonly known as: ZESTRIL   phentermine 37.5 MG tablet Commonly known as: ADIPEX-P       TAKE these medications    albuterol 108 (90 Base) MCG/ACT inhaler Commonly known  as: VENTOLIN HFA Inhale 2 puffs into the lungs every 6 (six) hours as needed (for exercise induced asthma).   ALIVE ONCE DAILY WOMENS PO Take 2 tablets by mouth daily. Gummies   Anoro Ellipta 62.5-25 MCG/INH Aepb Generic drug: umeclidinium-vilanterol Inhale 1 puff into the lungs daily as needed.   atorvastatin 10 MG tablet Commonly known as: LIPITOR SMARTSIG:1 Tablet(s) By Mouth Every Evening   cholecalciferol 10 MCG (400 UNIT) Tabs tablet Commonly known as: VITAMIN D3 Take 400 Units by mouth daily.   ezetimibe 10 MG tablet Commonly known as: ZETIA Take 10 mg by mouth daily.   levothyroxine 75 MCG tablet Commonly known as: SYNTHROID Take 75 mcg by mouth daily. What changed: Another medication with the same name was removed. Continue taking this medication, and follow the directions you see here.   metFORMIN 500 MG tablet Commonly known as: GLUCOPHAGE Take 500 mg by mouth 2 (two) times daily.   metoprolol tartrate 25 MG tablet Commonly known as: LOPRESSOR Take 25 mg by mouth 2 (two) times daily.   polyethylene glycol 17 g packet Commonly known as: MIRALAX / GLYCOLAX Take 17 g by mouth daily as needed for mild constipation.   Rybelsus 7 MG Tabs Generic drug: Semaglutide Take 1 tablet by mouth daily.        Discharge Exam: Filed Weights   08/04/23 1142  Weight: 66.6 kg   Appearance: She is normal weight.  HENT:     Head: Normocephalic.  Eyes:     Conjunctiva/sclera: Conjunctivae normal.     Pupils: Pupils are equal, round, and reactive to light.  Cardiovascular:     Rate and Rhythm: Normal rate and regular rhythm.     Heart sounds: Systolic murmur head Pulmonary:     Effort: Pulmonary effort is normal. No respiratory distress.     Breath sounds: Normal breath sounds. No wheezing, rhonchi or rales.  Abdominal:     General: Bowel sounds are normal. There is no distension.     Palpations: Abdomen is soft.     Tenderness: There is no abdominal tenderness.  There is no guarding.  Musculoskeletal:     Right lower leg: No edema.     Left lower leg: No edema.  Skin:    General: Skin is warm and dry.  Neurological:     General: No focal deficit present.     Mental Status: She is alert and oriented to person, place, and time. Mental status is at baseline.  Psychiatric:        Mood and Affect: Mood normal.        Behavior: Behavior normal.        Thought Content: Thought content normal.     Condition at discharge: good  The results of significant diagnostics from this hospitalization (including imaging, microbiology, ancillary and laboratory) are listed below for reference.   Imaging Studies: US BIOPSY (LIVER)  Result Date: 08/06/2023 INDICATION: Cecal mass with multiple liver lesions. The patient presents for biopsy of a liver mass. EXAM: ULTRASOUND GUIDED CORE BIOPSY OF RIGHT LOBE LIVER MASS  MEDICATIONS: None. ANESTHESIA/SEDATION: Moderate (conscious) sedation was employed during this procedure. A total of Versed 1.0 mg and Fentanyl 75 mcg was administered intravenously by radiology nursing. Moderate Sedation Time: 10 minutes. The patient's level of consciousness and vital signs were monitored continuously by radiology nursing throughout the procedure under my direct supervision. PROCEDURE: The procedure, risks, benefits, and alternatives were explained to the patient. Questions regarding the procedure were encouraged and answered. The patient understands and consents to the procedure. A time-out was performed prior to initiating the procedure. Ultrasound was initially performed of the liver to localize liver lesions. A mass within the right lobe was chosen for biopsy. The abdominal wall was prepped with chlorhexidine in a sterile fashion, and a sterile drape was applied covering the operative field. A sterile gown and sterile gloves were used for the procedure. Local anesthesia was provided with 1% Lidocaine. A 17 gauge trocar needle was advanced into  the liver at the level of a focal mass lesion. After confirming needle tip position, 3 separate coaxial 18 gauge core biopsy samples were obtained. Core biopsy samples were submitted in formalin. Gel-Foam pledgets were advanced through the outer needle as the needle was retracted and removed. Additional ultrasound was performed. COMPLICATIONS: None immediate. FINDINGS: Multiple mass lesions are seen throughout the liver parenchyma. A lesion within the inferior right lobe measuring approximately 5.5 x 3.9 x 5.0 cm was chosen for sampling. Solid tissue samples were obtained. IMPRESSION: Ultrasound-guided core biopsy performed of a lesion within the right lobe of the liver measuring up to 5.5 cm in maximum diameter. Electronically Signed   By: Irish Lack M.D.   On: 08/06/2023 13:55   CT ABDOMEN PELVIS W CONTRAST  Result Date: 08/05/2023 CLINICAL DATA:  Bilateral pulmonary nodules and ill-defined areas of low density in the liver concerning for metastatic disease on a recent chest CTA. EXAM: CT ABDOMEN AND PELVIS WITH CONTRAST TECHNIQUE: Multidetector CT imaging of the abdomen and pelvis was performed using the standard protocol following bolus administration of intravenous contrast. RADIATION DOSE REDUCTION: This exam was performed according to the departmental dose-optimization program which includes automated exposure control, adjustment of the mA and/or kV according to patient size and/or use of iterative reconstruction technique. CONTRAST:  OMNIPAQUE IOHEXOL 300 MG/ML  SOLN COMPARISON:  Chest CTA dated 08/04/2023. FINDINGS: Lower chest: Previously described lung nodules.  Normal-sized heart. Hepatobiliary: Multiple rounded and oval, heterogeneous, predominantly low-density masses throughout the liver. An index mass in the caudate lobe measures 6.6 x 4.0 cm on image number 22/2. Normal-appearing gallbladder. Pancreas: Unremarkable. No pancreatic ductal dilatation or surrounding inflammatory changes.  Spleen: Normal in size without focal abnormality. Adrenals/Urinary Tract: Normal. Adrenal glands. Small bilateral simple appearing renal cysts. These do not need imaging follow-up. Unremarkable urinary bladder and visualized portions of the ureters. No hydronephrosis or urinary tract calculi. Stomach/Bowel: Large, lobulated, cecal and inferior right colon mass. This measures 6.6 x 5.0 cm on image number 56/2 and is causing high-grade luminal stenosis. This extends into the ileocecal valve and distal ileum with marked dilatation of the distal ileum with fecalized material in the lumen. There is some oral contrast which has passed through the lumen. The stomach and proximal small bowel are unremarkable. The appendix is diffusely enlarged. Vascular/Lymphatic: Multiple enlarged mesenteric and retroperitoneal nodes. An index left anterior para-aortic node has a short axis diameter of 15 mm on image number 47/2. Atheromatous celiac artery calcifications without aneurysm. Reproductive: Enlarged uterus containing multiple masses, 1 of which is densely calcified.  The ovaries are not identified separate from these masses in there is some lobulated inferior pelvic fluid posteriorly, primarily on the left. Other: 3 ventral hernias containing hernia fat. 2.5 x 1.5 cm intramuscular lipoma anterior to the right hip. Musculoskeletal: Bilateral sacroiliac degenerative changes. Lumbar and lower thoracic spine degenerative changes with lower thoracic spine changes of DISH. No evidence of bony metastatic disease. IMPRESSION: 1. Large cecal and inferior right colon mass extending into the ileocecal valve and distal ileum with high-grade luminal stenosis and partial distal small bowel obstruction. This is also extending into the appendix and is compatible with a primary colon carcinoma. 2. Multiple hepatic metastases. 3. Multiple metastatic mesenteric and retroperitoneal nodes. 4. Multiple pulmonary metastases. 5. Enlarged uterus  containing multiple masses, 1 of which is densely calcified. The ovaries are not identified separate from these masses and there is some loculated inferior pelvic fluid posteriorly, primarily on the left. These findings are nonspecific and could be due to uterine fibroids and possible primary or metastatic ovarian neoplasms. 6. 3 ventral hernias containing herniated fat. Electronically Signed   By: Beckie Salts M.D.   On: 08/05/2023 13:44   CT Angio Chest PE W and/or Wo Contrast  Result Date: 08/04/2023 CLINICAL DATA:  Syncope.  Hypotension. EXAM: CT ANGIOGRAPHY CHEST WITH CONTRAST TECHNIQUE: Multidetector CT imaging of the chest was performed using the standard protocol during bolus administration of intravenous contrast. Multiplanar CT image reconstructions and MIPs were obtained to evaluate the vascular anatomy. RADIATION DOSE REDUCTION: This exam was performed according to the departmental dose-optimization program which includes automated exposure control, adjustment of the mA and/or kV according to patient size and/or use of iterative reconstruction technique. CONTRAST:  75mL OMNIPAQUE IOHEXOL 350 MG/ML SOLN COMPARISON:  None Available. FINDINGS: Cardiovascular: Satisfactory opacification of the pulmonary arteries to the segmental level. No evidence of pulmonary embolism. Normal heart size. No pericardial effusion. Mediastinum/Nodes: No enlarged mediastinal, hilar, or axillary lymph nodes. Thyroid gland, trachea, and esophagus demonstrate no significant findings. Lungs/Pleura: No pneumothorax or pleural effusion is noted. Multiple pulmonary nodules are noted bilaterally, predominantly on the left side. The largest nodule measures 1.4 cm in left lower lobe. 10 mm irregular nodule is noted in left upper lobe. 4 mm nodule is noted in right upper lobe. These are concerning for metastatic disease. Upper Abdomen: Multiple ill-defined large low densities are noted in the hepatic parenchyma highly concerning for  metastatic disease. Musculoskeletal: No chest wall abnormality. No acute or significant osseous findings. Review of the MIP images confirms the above findings. IMPRESSION: Bilateral pulmonary nodules are noted, predominantly on the left, concerning for metastatic disease. Also noted are multiple ill-defined large low densities in the hepatic parenchyma concerning for metastatic disease is well. PET scan is recommended for further evaluation. No definite evidence of pulmonary embolus. Electronically Signed   By: Lupita Raider M.D.   On: 08/04/2023 16:18    Microbiology: No results found for this or any previous visit.  Labs: CBC: Recent Labs  Lab 08/04/23 1153 08/05/23 0623 08/06/23 0517 08/07/23 0659  WBC 6.6 5.6 5.6 8.1  NEUTROABS 5.0  --  3.8 6.1  HGB 8.3* 7.8* 8.3* 8.6*  HCT 25.9* 24.4* 26.1* 27.0*  MCV 84.4 85.6 83.7 84.1  PLT 525* 620* 610* 603*   Basic Metabolic Panel: Recent Labs  Lab 08/04/23 1153 08/05/23 0623 08/06/23 0517 08/07/23 0659  NA 137 137 135 138  K 3.8 3.2* 4.4 5.0  CL 102 102 104 104  CO2 20* 25 25  26  GLUCOSE 81 78 104* 110*  BUN 26* 26* 20 16  CREATININE 0.96 0.82 0.86 0.79  CALCIUM 8.0* 8.0* 7.9* 8.3*  MG 1.9  --   --   --    Liver Function Tests: Recent Labs  Lab 08/04/23 1153  AST 45*  ALT 29  ALKPHOS 96  BILITOT 1.6*  PROT 6.9  ALBUMIN 2.7*   CBG: Recent Labs  Lab 08/06/23 1316 08/06/23 1610 08/06/23 2050 08/07/23 0836 08/07/23 1143  GLUCAP 88 155* 111* 99 151*    Discharge time spent:  40 minutes.  Signed: Loyce Dys, MD [facility] 08/07/2023

## 2023-08-10 ENCOUNTER — Telehealth: Payer: Self-pay | Admitting: Oncology

## 2023-08-10 NOTE — Telephone Encounter (Signed)
Attempted to call pt to schedule hospital follow up. Left her VM to return call to schedule.

## 2023-08-12 ENCOUNTER — Ambulatory Visit: Payer: BC Managed Care – PPO | Admitting: Registered Nurse

## 2023-08-12 ENCOUNTER — Other Ambulatory Visit: Payer: Self-pay

## 2023-08-12 ENCOUNTER — Encounter
Admission: RE | Disposition: A | Discharge status: Home / Self Care | Payer: Self-pay | Source: Home / Self Care | Attending: Internal Medicine

## 2023-08-12 ENCOUNTER — Encounter: Payer: Self-pay | Admitting: Internal Medicine

## 2023-08-12 ENCOUNTER — Ambulatory Visit
Admission: RE | Admit: 2023-08-12 | Discharge: 2023-08-12 | Disposition: A | Discharge status: Home / Self Care | Payer: BC Managed Care – PPO | Attending: Internal Medicine | Admitting: Internal Medicine

## 2023-08-12 DIAGNOSIS — Z79899 Other long term (current) drug therapy: Secondary | ICD-10-CM | POA: Insufficient documentation

## 2023-08-12 DIAGNOSIS — J4489 Other specified chronic obstructive pulmonary disease: Secondary | ICD-10-CM | POA: Diagnosis not present

## 2023-08-12 DIAGNOSIS — D500 Iron deficiency anemia secondary to blood loss (chronic): Secondary | ICD-10-CM | POA: Insufficient documentation

## 2023-08-12 DIAGNOSIS — K640 First degree hemorrhoids: Secondary | ICD-10-CM | POA: Insufficient documentation

## 2023-08-12 DIAGNOSIS — Z7984 Long term (current) use of oral hypoglycemic drugs: Secondary | ICD-10-CM | POA: Insufficient documentation

## 2023-08-12 DIAGNOSIS — R933 Abnormal findings on diagnostic imaging of other parts of digestive tract: Secondary | ICD-10-CM | POA: Diagnosis present

## 2023-08-12 DIAGNOSIS — E119 Type 2 diabetes mellitus without complications: Secondary | ICD-10-CM | POA: Insufficient documentation

## 2023-08-12 DIAGNOSIS — I10 Essential (primary) hypertension: Secondary | ICD-10-CM | POA: Insufficient documentation

## 2023-08-12 DIAGNOSIS — C180 Malignant neoplasm of cecum: Secondary | ICD-10-CM | POA: Diagnosis not present

## 2023-08-12 DIAGNOSIS — R634 Abnormal weight loss: Secondary | ICD-10-CM | POA: Insufficient documentation

## 2023-08-12 DIAGNOSIS — K6389 Other specified diseases of intestine: Secondary | ICD-10-CM | POA: Insufficient documentation

## 2023-08-12 DIAGNOSIS — K56691 Other complete intestinal obstruction: Secondary | ICD-10-CM | POA: Diagnosis not present

## 2023-08-12 DIAGNOSIS — E039 Hypothyroidism, unspecified: Secondary | ICD-10-CM | POA: Diagnosis not present

## 2023-08-12 HISTORY — PX: COLONOSCOPY: SHX5424

## 2023-08-12 HISTORY — PX: BIOPSY: SHX5522

## 2023-08-12 HISTORY — PX: SUBMUCOSAL TATTOO INJECTION: SHX6856

## 2023-08-12 LAB — GLUCOSE, CAPILLARY: Glucose-Capillary: 68 mg/dL — ABNORMAL LOW (ref 70–99)

## 2023-08-12 SURGERY — COLONOSCOPY
Anesthesia: General

## 2023-08-12 MED ORDER — PROPOFOL 10 MG/ML IV BOLUS
INTRAVENOUS | Status: DC | PRN
  Administered 2023-08-12: 20 mg via INTRAVENOUS
  Administered 2023-08-12: 40 mg via INTRAVENOUS

## 2023-08-12 MED ORDER — LIDOCAINE HCL (PF) 2 % IJ SOLN
INTRAMUSCULAR | Status: DC | PRN
Start: 2023-08-12 — End: 2023-08-12
  Administered 2023-08-12: 20 mg via INTRADERMAL

## 2023-08-12 MED ORDER — PROPOFOL 500 MG/50ML IV EMUL
INTRAVENOUS | Status: DC | PRN
  Administered 2023-08-12: 125 ug/kg/min via INTRAVENOUS

## 2023-08-12 MED ORDER — SPOT INK MARKER SYRINGE KIT
PACK | SUBMUCOSAL | Status: DC | PRN
Start: 2023-08-12 — End: 2023-08-12
  Administered 2023-08-12: 4 mL via SUBMUCOSAL

## 2023-08-12 MED ORDER — SODIUM CHLORIDE 0.9 % IV SOLN: INTRAVENOUS | Status: DC

## 2023-08-12 NOTE — Interval H&P Note (Signed)
History and Physical Interval Note:  08/12/2023 9:20 AM  Alexis Garcia  has presented today for surgery, with the diagnosis of Colonic mass (K63.89).  The various methods of treatment have been discussed with the patient and family. After consideration of risks, benefits and other options for treatment, the patient has consented to  Procedure(s) with comments: COLONOSCOPY (N/A) - 3rd patient as a surgical intervention.  The patient's history has been reviewed, patient examined, no change in status, stable for surgery.  I have reviewed the patient's chart and labs.  Questions were answered to the patient's satisfaction.     [place], [place]

## 2023-08-12 NOTE — Transfer of Care (Signed)
Immediate Anesthesia Transfer of Care Note  Patient: Alexis Garcia  Procedure(s) Performed: COLONOSCOPY SUBMUCOSAL TATTOO INJECTION BIOPSY  Patient Location: PACU  Anesthesia Type:General  Level of Consciousness: drowsy  Airway & Oxygen Therapy: Patient Spontanous Breathing  Post-op Assessment: Report given to RN and Post -op Vital signs reviewed and stable  Post vital signs: Reviewed and stable  Last Vitals:  Vitals Value Taken Time  BP 107/69 08/12/23 1017  Temp 36.1 C 08/12/23 1017  Pulse 63 08/12/23 1018  Resp 14 08/12/23 1017  SpO2 100 % 08/12/23 1018  Vitals shown include unfiled device data.  Last Pain:  Vitals:   08/12/23 1017  TempSrc: Temporal  PainSc: 0-No pain         Complications: No notable events documented.

## 2023-08-12 NOTE — H&P (Signed)
  Outpatient short stay form Pre-procedure 08/12/2023 9:19 AM Rodrickus Min K. Norma Fredrickson, M.D.  Primary Physician: Franco Nones, NP   Reason for visit:  Weight loss, cecal mass on imaging  History of present illness:  62 y/o female recently hospitalized with symptomatic anemia, weight loss, and cecal mass noted on CT scan of the abdomen and pelvis. Patient has involuntary weight loss of 80+ lbs over the past year.    Current Facility-Administered Medications:    0.9 %  sodium chloride infusion, , Intravenous, Continuous, Ieasha Boerema, Boykin Nearing, MD  Medications Prior to Admission  Medication Sig Dispense Refill Last Dose   albuterol (PROVENTIL HFA;VENTOLIN HFA) 108 (90 Base) MCG/ACT inhaler Inhale 2 puffs into the lungs every 6 (six) hours as needed (for exercise induced asthma).      atorvastatin (LIPITOR) 10 MG tablet SMARTSIG:1 Tablet(s) By Mouth Every Evening      cholecalciferol (VITAMIN D) 400 units TABS tablet Take 400 Units by mouth daily.      ezetimibe (ZETIA) 10 MG tablet Take 10 mg by mouth daily.      levothyroxine (SYNTHROID) 75 MCG tablet Take 75 mcg by mouth daily.      metFORMIN (GLUCOPHAGE) 500 MG tablet Take 500 mg by mouth 2 (two) times daily.       metoprolol tartrate (LOPRESSOR) 25 MG tablet Take 25 mg by mouth 2 (two) times daily.      Multiple Vitamins-Minerals (ALIVE ONCE DAILY WOMENS PO) Take 2 tablets by mouth daily. Gummies      polyethylene glycol (MIRALAX / GLYCOLAX) 17 g packet Take 17 g by mouth daily as needed for mild constipation. 14 each 0    RYBELSUS 7 MG TABS Take 1 tablet by mouth daily.      umeclidinium-vilanterol (ANORO ELLIPTA) 62.5-25 MCG/INH AEPB Inhale 1 puff into the lungs daily as needed. (Patient not taking: Reported on 08/05/2023)        No Known Allergies   Past Medical History:  Diagnosis Date   Abnormal uterine bleeding    Asthma    Cardiomegaly    Cardiovascular disease    COPD (chronic obstructive pulmonary disease) (HCC)    Diabetes  mellitus without complication (HCC)    Goiter, nontoxic, multinodular    Hypertension    Hypothyroidism    Thyrotoxicosis     Review of systems:  Otherwise negative.    Physical Exam  Gen: Alert, oriented. Appears stated age.  HEENT: /AT. PERRLA. Lungs: CTA, no wheezes. CV: RR nl S1, S2. Abd: soft, benign, no masses. BS+ Ext: No edema. Pulses 2+    Planned procedures: Proceed with colonoscopy. The patient understands the nature of the planned procedure, indications, risks, alternatives and potential complications including but not limited to bleeding, infection, perforation, damage to internal organs and possible oversedation/side effects from anesthesia. The patient agrees and gives consent to proceed.  Please refer to procedure notes for findings, recommendations and patient disposition/instructions.     Harshika Mago K. Norma Fredrickson, M.D. Gastroenterology 08/12/2023  9:19 AM

## 2023-08-12 NOTE — Op Note (Signed)
[facility] Gastroenterology Patient Name: Alexis Garcia Procedure Date: 08/12/2023 9:18 AM MRN: [id number] Account #: [id number] Date of Birth: 1961/05/10 Admit Type: Outpatient Age: 62 Room: [facility] ENDO ROOM 2 Gender: Female Note Status: Finalized Instrument Name: Nelda Marseille [id number] Procedure:             Colonoscopy Indications:           Iron deficiency anemia secondary to chronic blood                         loss, Abnormal CT of the GI tract, Colon mass, Weight                         loss Providers:             Boykin Nearing. Norma Fredrickson MD, MD Referring MD:          Fernand Parkins. Clint Guy (Referring MD) Medicines:             Propofol per Anesthesia Complications:         No immediate complications. Estimated blood loss:                         Minimal. Procedure:             Pre-Anesthesia Assessment:                        - The risks and benefits of the procedure and the                         sedation options and risks were discussed with the                         patient. All questions were answered and informed                         consent was obtained.                        - Patient identification and proposed procedure were                         verified prior to the procedure by the nurse. The                         procedure was verified in the procedure room.                        - ASA Grade Assessment: III - A patient with severe                         systemic disease.                        - After reviewing the risks and benefits, the patient                         was deemed in satisfactory condition to undergo the  procedure.                        After obtaining informed consent, the colonoscope was                         passed under direct vision. Throughout the procedure,                         the patient's blood pressure, pulse, and oxygen                         saturations were monitored continuously. The                          Colonoscope was introduced through the anus and                         advanced to the the cecum, identified by the ileocecal                         valve. The colonoscopy was performed without                         difficulty. The patient tolerated the procedure well.                         The quality of the bowel preparation was adequate. The                         ileocecal valve was photographed. Findings:      The perianal and digital rectal examinations were normal. Pertinent       negatives include normal sphincter tone and no palpable rectal lesions.      Non-bleeding internal hemorrhoids were found during retroflexion. The       hemorrhoids were Grade I (internal hemorrhoids that do not prolapse).      An infiltrative and ulcerated completely obstructing large mass was       found in the proximal ascending colon and in the cecum. The mass was       circumferential. The mass measured four cm in length. In addition, its       diameter measured five mm. No bleeding was present. Biopsies were taken       with a cold forceps for histology. Area was tattooed with an injection       of 4 mL of Spot (carbon black).      The exam was otherwise without abnormality. Impression:            - Non-bleeding internal hemorrhoids.                        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 Biopsied.                         Tattooed.                        -  The examination was otherwise normal. Recommendation:        - Patient has a contact number available for                         emergencies. The signs and symptoms of potential                         delayed complications were discussed with the patient.                         Return to normal activities tomorrow. Written                         discharge instructions were provided to the patient.                        -  Mechanical soft diet.                        - Continue present medications.                        - Await pathology results. STAT reading was requested.                        - Follow up with Oncology - Dr. Orlie Dakin as scheduled.                        - The findings and recommendations were discussed with                         the patient. Procedure Code(s):     --- Professional ---                        [phone], Colonoscopy, flexible; with biopsy, single or                         multiple                        [id number], Colonoscopy, flexible; with directed submucosal                         injection(s), any substance Diagnosis Code(s):     --- Professional ---                        R93.3, Abnormal findings on diagnostic imaging of                         other parts of digestive tract                        R63.4, Abnormal weight loss                        K63.89, Other specified diseases of intestine                        D50.0, Iron deficiency anemia secondary to blood loss                         (  chronic)                        K64.0, First degree hemorrhoids                        K56.691, Other complete intestinal obstruction                        D49.0, Neoplasm of unspecified behavior of digestive                         system CPT copyright 2022 [employer]. All rights reserved. The codes documented in this report are preliminary and upon coder review may  be revised to meet current compliance requirements. Stanton Kidney MD, MD 08/12/2023 10:18:42 AM This report has been signed electronically. Number of Addenda: 0 Note Initiated On: 08/12/2023 9:18 AM Scope Withdrawal Time: 0 hours 7 minutes 14 seconds  Total Procedure Duration: 0 hours 13 minutes 8 seconds  Estimated Blood Loss:  Estimated blood loss was minimal.      [facility]

## 2023-08-12 NOTE — Anesthesia Preprocedure Evaluation (Signed)
Anesthesia Evaluation  Patient identified by MRN, date of birth, ID band Patient awake    Reviewed: Allergy & Precautions, NPO status , Patient's Chart, lab work & pertinent test results  History of Anesthesia Complications Negative for: history of anesthetic complications  Airway Mallampati: III  TM Distance: >3 FB Neck ROM: Full    Dental  (+) Dental Advidsory Given, Poor Dentition   Pulmonary neg shortness of breath, asthma , COPD,  COPD inhaler, neg recent URI, Patient abstained from smoking., former smoker   breath sounds clear to auscultation- rhonchi (-) wheezing      Cardiovascular Exercise Tolerance: Good hypertension, Pt. on medications (-) angina (-) CAD, (-) Past MI, (-) Cardiac Stents and (-) CABG (-) dysrhythmias (-) Valvular Problems/Murmurs Rhythm:Regular Rate:Normal - Systolic murmurs and - Diastolic murmurs    Neuro/Psych negative neurological ROS  negative psych ROS   GI/Hepatic negative GI ROS, Neg liver ROS,,,  Endo/Other  diabetes, Oral Hypoglycemic AgentsHypothyroidism    Renal/GU negative Renal ROS     Musculoskeletal negative musculoskeletal ROS (+)    Abdominal  (+) + obese  Peds  Hematology  (+) Blood dyscrasia, anemia   Anesthesia Other Findings Past Medical History: No date: Abnormal uterine bleeding No date: Asthma No date: Cardiomegaly No date: Cardiovascular disease No date: COPD (chronic obstructive pulmonary disease) (HCC) No date: Diabetes mellitus without complication (HCC) 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, nontoxic, multinodular No date: Hypertension No date: Hypothyroidism No date: Thyrotoxicosis   Reproductive/Obstetrics                             Anesthesia Physical Anesthesia Plan  ASA: 3  Anesthesia Plan: General   Post-op Pain Management:    Induction: Intravenous  PONV Risk Score and Plan: 3 and Propofol infusion and TIVA  Airway Management  Planned: Natural Airway and Nasal Cannula  Additional Equipment:   Intra-op Plan:   Post-operative Plan:   Informed Consent: I have reviewed the patients History and Physical, chart, labs and discussed the procedure including the risks, benefits and alternatives for the proposed anesthesia with the patient or authorized representative who has indicated his/her understanding and acceptance.     Dental advisory given  Plan Discussed with: CRNA and Anesthesiologist  Anesthesia Plan Comments:         Anesthesia Quick Evaluation

## 2023-08-13 ENCOUNTER — Encounter: Payer: Self-pay | Admitting: Internal Medicine

## 2023-08-13 LAB — SURGICAL PATHOLOGY

## 2023-08-17 ENCOUNTER — Encounter: Payer: Self-pay | Admitting: Oncology

## 2023-08-17 ENCOUNTER — Inpatient Hospital Stay: Payer: BC Managed Care – PPO

## 2023-08-17 ENCOUNTER — Telehealth: Payer: Self-pay | Admitting: *Deleted

## 2023-08-17 ENCOUNTER — Inpatient Hospital Stay: Payer: BC Managed Care – PPO | Attending: Oncology | Admitting: Oncology

## 2023-08-17 VITALS — BP 107/66 | HR 65 | Temp 96.6°F | Resp 16 | Ht 66.0 in | Wt 152.0 lb

## 2023-08-17 DIAGNOSIS — Z7984 Long term (current) use of oral hypoglycemic drugs: Secondary | ICD-10-CM | POA: Diagnosis not present

## 2023-08-17 DIAGNOSIS — D259 Leiomyoma of uterus, unspecified: Secondary | ICD-10-CM

## 2023-08-17 DIAGNOSIS — E039 Hypothyroidism, unspecified: Secondary | ICD-10-CM | POA: Diagnosis not present

## 2023-08-17 DIAGNOSIS — D649 Anemia, unspecified: Secondary | ICD-10-CM | POA: Diagnosis not present

## 2023-08-17 DIAGNOSIS — G4700 Insomnia, unspecified: Secondary | ICD-10-CM | POA: Insufficient documentation

## 2023-08-17 DIAGNOSIS — Z79890 Hormone replacement therapy (postmenopausal): Secondary | ICD-10-CM | POA: Insufficient documentation

## 2023-08-17 DIAGNOSIS — Z86018 Personal history of other benign neoplasm: Secondary | ICD-10-CM | POA: Diagnosis not present

## 2023-08-17 DIAGNOSIS — D390 Neoplasm of uncertain behavior of uterus: Secondary | ICD-10-CM | POA: Diagnosis not present

## 2023-08-17 DIAGNOSIS — C182 Malignant neoplasm of ascending colon: Secondary | ICD-10-CM | POA: Diagnosis not present

## 2023-08-17 DIAGNOSIS — J4489 Other specified chronic obstructive pulmonary disease: Secondary | ICD-10-CM | POA: Insufficient documentation

## 2023-08-17 DIAGNOSIS — C7800 Secondary malignant neoplasm of unspecified lung: Secondary | ICD-10-CM | POA: Diagnosis not present

## 2023-08-17 DIAGNOSIS — Z87891 Personal history of nicotine dependence: Secondary | ICD-10-CM | POA: Diagnosis not present

## 2023-08-17 DIAGNOSIS — E876 Hypokalemia: Secondary | ICD-10-CM | POA: Diagnosis not present

## 2023-08-17 DIAGNOSIS — E042 Nontoxic multinodular goiter: Secondary | ICD-10-CM | POA: Diagnosis not present

## 2023-08-17 DIAGNOSIS — I2510 Atherosclerotic heart disease of native coronary artery without angina pectoris: Secondary | ICD-10-CM | POA: Diagnosis not present

## 2023-08-17 DIAGNOSIS — Z5111 Encounter for antineoplastic chemotherapy: Secondary | ICD-10-CM | POA: Insufficient documentation

## 2023-08-17 DIAGNOSIS — C189 Malignant neoplasm of colon, unspecified: Secondary | ICD-10-CM

## 2023-08-17 DIAGNOSIS — Z79899 Other long term (current) drug therapy: Secondary | ICD-10-CM | POA: Insufficient documentation

## 2023-08-17 DIAGNOSIS — C787 Secondary malignant neoplasm of liver and intrahepatic bile duct: Secondary | ICD-10-CM

## 2023-08-17 DIAGNOSIS — E119 Type 2 diabetes mellitus without complications: Secondary | ICD-10-CM | POA: Diagnosis not present

## 2023-08-17 DIAGNOSIS — D75839 Thrombocytosis, unspecified: Secondary | ICD-10-CM | POA: Insufficient documentation

## 2023-08-17 LAB — COMPREHENSIVE METABOLIC PANEL
ALT: 25 U/L (ref 0–44)
AST: 47 U/L — ABNORMAL HIGH (ref 15–41)
Albumin: 2.4 g/dL — ABNORMAL LOW (ref 3.5–5.0)
Alkaline Phosphatase: 97 U/L (ref 38–126)
Anion gap: 7 (ref 5–15)
BUN: 16 mg/dL (ref 6–20)
CO2: 25 mmol/L (ref 22–32)
Calcium: 8.1 mg/dL — ABNORMAL LOW (ref 8.9–10.3)
Chloride: 103 mmol/L (ref 98–111)
Creatinine, Ser: 0.85 mg/dL (ref 0.44–1.00)
GFR, Estimated: >60 mL/min (ref >60)
Glucose, Bld: 131 mg/dL — ABNORMAL HIGH (ref 70–99)
Potassium: 3.7 mmol/L (ref 3.5–5.1)
Sodium: 135 mmol/L (ref 135–145)
Total Bilirubin: 0.7 mg/dL (ref 0.3–1.2)
Total Protein: 6.4 g/dL — ABNORMAL LOW (ref 6.5–8.1)

## 2023-08-17 LAB — CBC WITH DIFFERENTIAL (CANCER CENTER ONLY)
Abs Immature Granulocytes: 0.02 10*3/uL (ref 0.00–0.07)
Basophils Absolute: 0 10*3/uL (ref 0.0–0.1)
Basophils Relative: 1 %
Eosinophils Absolute: 0.1 10*3/uL (ref 0.0–0.5)
Eosinophils Relative: 1 %
HCT: 23.5 % — ABNORMAL LOW (ref 36.0–46.0)
Hemoglobin: 7.3 g/dL — ABNORMAL LOW (ref 12.0–15.0)
Immature Granulocytes: 1 %
Lymphocytes Relative: 16 %
Lymphs Abs: 0.7 10*3/uL (ref 0.7–4.0)
MCH: 26.2 pg (ref 26.0–34.0)
MCHC: 31.1 g/dL (ref 30.0–36.0)
MCV: 84.2 fL (ref 80.0–100.0)
Monocytes Absolute: 0.4 10*3/uL (ref 0.1–1.0)
Monocytes Relative: 8 %
Neutro Abs: 3.3 10*3/uL (ref 1.7–7.7)
Neutrophils Relative %: 73 %
Platelet Count: 589 10*3/uL — ABNORMAL HIGH (ref 150–400)
RBC: 2.79 MIL/uL — ABNORMAL LOW (ref 3.87–5.11)
RDW: 16.1 % — ABNORMAL HIGH (ref 11.5–15.5)
WBC Count: 4.4 10*3/uL (ref 4.0–10.5)
nRBC: 0 % (ref 0.0–0.2)

## 2023-08-17 MED ORDER — LIDOCAINE-PRILOCAINE 2.5-2.5 % EX CREA: TOPICAL_CREAM | CUTANEOUS | 3 refills | Status: DC

## 2023-08-17 MED ORDER — PROCHLORPERAZINE MALEATE 10 MG PO TABS: 10.0000 mg | ORAL_TABLET | Freq: Four times a day (QID) | ORAL | 1 refills | Status: DC | PRN

## 2023-08-17 MED ORDER — ONDANSETRON HCL 8 MG PO TABS: 8.0000 mg | ORAL_TABLET | Freq: Three times a day (TID) | ORAL | 1 refills | Status: DC | PRN

## 2023-08-17 NOTE — Telephone Encounter (Signed)
Received FMLA form for this patient, form completed and sent for physician signature

## 2023-08-17 NOTE — Progress Notes (Signed)
Introduced Visual merchandiser and provided my contact information for future needs. Will assist in arranging port placement.

## 2023-08-17 NOTE — Progress Notes (Signed)
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

## 2023-08-17 NOTE — Progress Notes (Signed)
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  INTERVAL HISTORY: Patient returns to clinic today for hospital follow-up, discussion of her pathology results, and treatment planning.  She continues to have a poor appetite and weakness and fatigue, but otherwise feels well.  She denies any pain.  She denies any fevers.  She has no neurologic complaints.  She has no chest pain, shortness of breath, cough, or hemoptysis.  She denies any nausea, vomiting, constipation, or diarrhea.  She has no melena or hematochezia.  She has no urinary complaints.  Patient offers no further specific complaints today.  REVIEW OF SYSTEMS:   Review of Systems  Constitutional:  Positive for malaise/fatigue. Negative for fever and weight loss.  Respiratory: Negative.  Negative for cough, hemoptysis and shortness of breath.   Cardiovascular: Negative.  Negative for chest pain and leg swelling.  Gastrointestinal: Negative.  Negative for abdominal pain, blood in stool, constipation, diarrhea, melena, nausea and vomiting.  Genitourinary: Negative.  Negative for dysuria.  Musculoskeletal: Negative.  Negative for back pain.  Neurological:  Positive for weakness. Negative for dizziness, focal weakness and headaches.  Psychiatric/Behavioral: Negative.  The patient is not nervous/anxious.     As per HPI. Otherwise, a complete review of systems is negative.  PAST MEDICAL HISTORY: Past Medical History:  Diagnosis Date   Abnormal uterine bleeding    Asthma    Cardiomegaly    Cardiovascular disease    COPD (chronic obstructive pulmonary disease) (HCC)    Diabetes mellitus without complication (HCC)    Goiter,  nontoxic, multinodular    Hypertension    Hypothyroidism    Thyrotoxicosis     PAST SURGICAL HISTORY: Past Surgical History:  Procedure Laterality Date   BIOPSY  08/12/2023   Procedure: BIOPSY;  Surgeon: Norma Fredrickson, Boykin Nearing, MD;  Location: [facility] ENDOSCOPY;  Service: Gastroenterology;;   CESAREAN SECTION     COLONOSCOPY N/A 08/12/2023   Procedure: COLONOSCOPY;  Surgeon: Toledo, Boykin Nearing, MD;  Location: [facility] ENDOSCOPY;  Service: Gastroenterology;  Laterality: N/A;  3rd patient   COLONOSCOPY WITH PROPOFOL N/A 10/29/2015   Procedure: COLONOSCOPY WITH PROPOFOL;  Surgeon: Elnita Maxwell, MD;  Location: [facility] ENDOSCOPY;  Service: Endoscopy;  Laterality: N/A;   DILITATION & CURRETTAGE/HYSTROSCOPY WITH NOVASURE ABLATION N/A 06/25/2018   Procedure: DILATATION & CURETTAGE/HYSTEROSCOPY WITH MINERVA ABLATION;  Surgeon: Hildred Laser, MD;  Location: [facility] ORS;  Service: Gynecology;  Laterality: N/A;   SUBMUCOSAL TATTOO INJECTION  08/12/2023   Procedure: SUBMUCOSAL TATTOO INJECTION;  Surgeon: Toledo, Boykin Nearing, MD;  Location: [facility] ENDOSCOPY;  Service: Gastroenterology;;   TUBAL LIGATION      FAMILY HISTORY: Family History  Adopted: Yes  Problem Relation Age of Onset   Breast cancer Neg Hx     ADVANCED DIRECTIVES (Y/N):  N  HEALTH MAINTENANCE: Social History   Tobacco Use   Smoking status: Former    Current packs/day: 0.00    Types: Cigarettes    Quit date: 2024    Years since quitting: 0.7   Smokeless tobacco: Never  Vaping Use   Vaping status: Former  Substance Use Topics   Alcohol use: Never   Drug use: Never     Colonoscopy:  PAP:  Bone density:  Lipid panel:  No Known Allergies  Current Outpatient Medications  Medication Sig Dispense Refill   albuterol (PROVENTIL HFA;VENTOLIN HFA) 108 (90 Base) MCG/ACT inhaler Inhale 2 puffs into the lungs every 6 (six) hours as needed (for exercise induced asthma).     atorvastatin (LIPITOR) 10 MG tablet SMARTSIG:1 Tablet(s) By Mouth  Every Evening     cholecalciferol (VITAMIN D) 400 units TABS tablet Take 400 Units by mouth daily.     ezetimibe (ZETIA) 10 MG tablet Take 10 mg by mouth daily.     levothyroxine (SYNTHROID) 75 MCG tablet Take 75 mcg by mouth daily.     metFORMIN (GLUCOPHAGE) 500 MG tablet Take 500 mg by mouth 2 (two) times daily.      metoprolol tartrate (LOPRESSOR) 25 MG tablet Take 25 mg by mouth 2 (two) times daily.     Multiple Vitamins-Minerals (ALIVE ONCE DAILY WOMENS PO) Take 2 tablets by mouth daily. Gummies     polyethylene glycol (MIRALAX / GLYCOLAX) 17 g packet Take 17 g by mouth daily as needed for mild constipation. 14 each 0   RYBELSUS 7 MG TABS Take 1 tablet by mouth daily. (Patient not taking: Reported on 08/12/2023)     umeclidinium-vilanterol (ANORO ELLIPTA) 62.5-25 MCG/INH AEPB Inhale 1 puff into the lungs daily as needed. (Patient not taking: Reported on 08/05/2023)     No current facility-administered medications for this visit.    OBJECTIVE: Vitals:   08/17/23 0950  BP: 107/66  Pulse: 65  Resp: 16  Temp: (!) 96.6 F (35.9 C)  SpO2: 100%     Body mass index is 24.53 kg/m.    ECOG FS:1 - Symptomatic but completely ambulatory  General: Well-developed, well-nourished, no acute distress. Eyes: Pink conjunctiva, anicteric sclera. HEENT: Normocephalic, moist mucous membranes. Lungs: No audible wheezing or coughing. Heart: Regular rate and rhythm. Abdomen: Soft, nontender, no obvious distention. Musculoskeletal: No edema, cyanosis, or clubbing. Neuro: Alert, answering all questions appropriately. Cranial nerves grossly intact. Skin: No rashes or petechiae noted. Psych: Normal affect. Lymphatics: No cervical, calvicular, axillary or inguinal LAD.   LAB RESULTS:  Lab Results  Component Value Date   NA 135 08/17/2023   K 3.7 08/17/2023   CL 103 08/17/2023   CO2 25 08/17/2023   GLUCOSE 131 (H) 08/17/2023   BUN 16 08/17/2023   CREATININE 0.85 08/17/2023   CALCIUM 8.1 (L)  08/17/2023   PROT 6.4 (L) 08/17/2023   ALBUMIN 2.4 (L) 08/17/2023   AST 47 (H) 08/17/2023   ALT 25 08/17/2023   ALKPHOS 97 08/17/2023   BILITOT 0.7 08/17/2023   GFRNONAA >60 08/17/2023   GFRAA >60 06/09/2018    Lab Results  Component Value Date   WBC 4.4 08/17/2023   NEUTROABS 3.3 08/17/2023   HGB 7.3 (L) 08/17/2023   HCT 23.5 (L) 08/17/2023   MCV 84.2 08/17/2023   PLT 589 (H) 08/17/2023     STUDIES: US BIOPSY (LIVER)  Result Date: 08/06/2023 INDICATION: Cecal mass with multiple liver lesions. The patient presents for biopsy of a liver mass. EXAM: ULTRASOUND GUIDED CORE BIOPSY OF RIGHT LOBE LIVER MASS MEDICATIONS: None. ANESTHESIA/SEDATION: Moderate (conscious) sedation was employed during this procedure. A total of Versed 1.0 mg and Fentanyl 75 mcg was administered intravenously by radiology nursing. Moderate Sedation Time: 10 minutes. The patient's level of consciousness and vital signs were monitored continuously by radiology nursing throughout the procedure under my direct supervision. PROCEDURE: The procedure, risks, benefits, and alternatives were explained to the patient. Questions regarding  the procedure were encouraged and answered. The patient understands and consents to the procedure. A time-out was performed prior to initiating the procedure. Ultrasound was initially performed of the liver to localize liver lesions. A mass within the right lobe was chosen for biopsy. The abdominal wall was prepped with chlorhexidine in a sterile fashion, and a sterile drape was applied covering the operative field. A sterile gown and sterile gloves were used for the procedure. Local anesthesia was provided with 1% Lidocaine. A 17 gauge trocar needle was advanced into the liver at the level of a focal mass lesion. After confirming needle tip position, 3 separate coaxial 18 gauge core biopsy samples were obtained. Core biopsy samples were submitted in formalin. Gel-Foam pledgets were advanced  through the outer needle as the needle was retracted and removed. Additional ultrasound was performed. COMPLICATIONS: None immediate. FINDINGS: Multiple mass lesions are seen throughout the liver parenchyma. A lesion within the inferior right lobe measuring approximately 5.5 x 3.9 x 5.0 cm was chosen for sampling. Solid tissue samples were obtained. IMPRESSION: Ultrasound-guided core biopsy performed of a lesion within the right lobe of the liver measuring up to 5.5 cm in maximum diameter. Electronically Signed   By: Irish Lack M.D.   On: 08/06/2023 13:55   CT ABDOMEN PELVIS W CONTRAST  Result Date: 08/05/2023 CLINICAL DATA:  Bilateral pulmonary nodules and ill-defined areas of low density in the liver concerning for metastatic disease on a recent chest CTA. EXAM: CT ABDOMEN AND PELVIS WITH CONTRAST TECHNIQUE: Multidetector CT imaging of the abdomen and pelvis was performed using the standard protocol following bolus administration of intravenous contrast. RADIATION DOSE REDUCTION: This exam was performed according to the departmental dose-optimization program which includes automated exposure control, adjustment of the mA and/or kV according to patient size and/or use of iterative reconstruction technique. CONTRAST:  OMNIPAQUE IOHEXOL 300 MG/ML  SOLN COMPARISON:  Chest CTA dated 08/04/2023. FINDINGS: Lower chest: Previously described lung nodules.  Normal-sized heart. Hepatobiliary: Multiple rounded and oval, heterogeneous, predominantly low-density masses throughout the liver. An index mass in the caudate lobe measures 6.6 x 4.0 cm on image number 22/2. Normal-appearing gallbladder. Pancreas: Unremarkable. No pancreatic ductal dilatation or surrounding inflammatory changes. Spleen: Normal in size without focal abnormality. Adrenals/Urinary Tract: Normal. Adrenal glands. Small bilateral simple appearing renal cysts. These do not need imaging follow-up. Unremarkable urinary bladder and visualized  portions of the ureters. No hydronephrosis or urinary tract calculi. Stomach/Bowel: Large, lobulated, cecal and inferior right colon mass. This measures 6.6 x 5.0 cm on image number 56/2 and is causing high-grade luminal stenosis. This extends into the ileocecal valve and distal ileum with marked dilatation of the distal ileum with fecalized material in the lumen. There is some oral contrast which has passed through the lumen. The stomach and proximal small bowel are unremarkable. The appendix is diffusely enlarged. Vascular/Lymphatic: Multiple enlarged mesenteric and retroperitoneal nodes. An index left anterior para-aortic node has a short axis diameter of 15 mm on image number 47/2. Atheromatous celiac artery calcifications without aneurysm. Reproductive: Enlarged uterus containing multiple masses, 1 of which is densely calcified. The ovaries are not identified separate from these masses in there is some lobulated inferior pelvic fluid posteriorly, primarily on the left. Other: 3 ventral hernias containing hernia fat. 2.5 x 1.5 cm intramuscular lipoma anterior to the right hip. Musculoskeletal: Bilateral sacroiliac degenerative changes. Lumbar and lower thoracic spine degenerative changes with lower thoracic spine changes of DISH. No evidence of bony metastatic disease. IMPRESSION: 1.  Large cecal and inferior right colon mass extending into the ileocecal valve and distal ileum with high-grade luminal stenosis and partial distal small bowel obstruction. This is also extending into the appendix and is compatible with a primary colon carcinoma. 2. Multiple hepatic metastases. 3. Multiple metastatic mesenteric and retroperitoneal nodes. 4. Multiple pulmonary metastases. 5. Enlarged uterus containing multiple masses, 1 of which is densely calcified. The ovaries are not identified separate from these masses and there is some loculated inferior pelvic fluid posteriorly, primarily on the left. These findings are  nonspecific and could be due to uterine fibroids and possible primary or metastatic ovarian neoplasms. 6. 3 ventral hernias containing herniated fat. Electronically Signed   By: Beckie Salts M.D.   On: 08/05/2023 13:44   CT Angio Chest PE W and/or Wo Contrast  Result Date: 08/04/2023 CLINICAL DATA:  Syncope.  Hypotension. EXAM: CT ANGIOGRAPHY CHEST WITH CONTRAST TECHNIQUE: Multidetector CT imaging of the chest was performed using the standard protocol during bolus administration of intravenous contrast. Multiplanar CT image reconstructions and MIPs were obtained to evaluate the vascular anatomy. RADIATION DOSE REDUCTION: This exam was performed according to the departmental dose-optimization program which includes automated exposure control, adjustment of the mA and/or kV according to patient size and/or use of iterative reconstruction technique. CONTRAST:  75mL OMNIPAQUE IOHEXOL 350 MG/ML SOLN COMPARISON:  None Available. FINDINGS: Cardiovascular: Satisfactory opacification of the pulmonary arteries to the segmental level. No evidence of pulmonary embolism. Normal heart size. No pericardial effusion. Mediastinum/Nodes: No enlarged mediastinal, hilar, or axillary lymph nodes. Thyroid gland, trachea, and esophagus demonstrate no significant findings. Lungs/Pleura: No pneumothorax or pleural effusion is noted. Multiple pulmonary nodules are noted bilaterally, predominantly on the left side. The largest nodule measures 1.4 cm in left lower lobe. 10 mm irregular nodule is noted in left upper lobe. 4 mm nodule is noted in right upper lobe. These are concerning for metastatic disease. Upper Abdomen: Multiple ill-defined large low densities are noted in the hepatic parenchyma highly concerning for metastatic disease. Musculoskeletal: No chest wall abnormality. No acute or significant osseous findings. Review of the MIP images confirms the above findings. IMPRESSION: Bilateral pulmonary nodules are noted, predominantly  on the left, concerning for metastatic disease. Also noted are multiple ill-defined large low densities in the hepatic parenchyma concerning for metastatic disease is well. PET scan is recommended for further evaluation. No definite evidence of pulmonary embolus. Electronically Signed   By: Lupita Raider M.D.   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  Liver biopsy completed on August 06, 2023 confirmed diagnosis.  Initial CEA is 1440.  Patient also underwent colonoscopy that appeared to show a complete obstruction, but patient reports she is passing stool and gas.  She also denies pain.  Patient will require chemotherapy using FOLFOX plus Avastin.  Plan to initiate treatment in approximately 2 weeks.  She will require port placement.  Return to clinic on August 31, 2023 to initiate cycle 1. Uterine masses: Patient reports she has a history of benign fibroids.  Will order CA125 today and patient will see gynecology oncology on Wednesday for completeness. Anemia: Patient's hemoglobin is trended down to 7.3.  She may benefit from blood transfusion in the near future. Thrombocytosis: Likely reactive, monitor. Hyperbilirubinemia: Resolved.  Patient expressed understanding and was in agreement with this plan. She also understands that She can call clinic at any time with any questions, concerns, or complaints.    Cancer Staging  Primary adenocarcinoma of ascending colon [facility]) Staging form: Colon and Rectum, AJCC 8th Edition - Clinical stage from 08/17/2023: Stage IVB (cTX, cNX, pM1b) - Signed by Jeralyn Ruths, MD on 08/17/2023 Stage prefix: Initial diagnosis   Jeralyn Ruths, MD   08/17/2023 12:38 PM

## 2023-08-18 ENCOUNTER — Encounter: Payer: Self-pay | Admitting: Oncology

## 2023-08-18 ENCOUNTER — Other Ambulatory Visit: Payer: Self-pay

## 2023-08-18 LAB — CA 125: Cancer Antigen (CA) 125: 57.9 U/mL — ABNORMAL HIGH (ref 0.0–38.1)

## 2023-08-18 NOTE — Telephone Encounter (Signed)
Form signed and faxed as requested. MyChart message sent to patient informing her of this

## 2023-08-19 ENCOUNTER — Inpatient Hospital Stay (HOSPITAL_BASED_OUTPATIENT_CLINIC_OR_DEPARTMENT_OTHER): Payer: BC Managed Care – PPO | Admitting: Obstetrics and Gynecology

## 2023-08-19 VITALS — BP 94/64 | HR 65 | Temp 96.2°F | Resp 20 | Wt 152.5 lb

## 2023-08-19 DIAGNOSIS — C189 Malignant neoplasm of colon, unspecified: Secondary | ICD-10-CM

## 2023-08-19 DIAGNOSIS — D259 Leiomyoma of uterus, unspecified: Secondary | ICD-10-CM

## 2023-08-19 DIAGNOSIS — C182 Malignant neoplasm of ascending colon: Secondary | ICD-10-CM

## 2023-08-19 DIAGNOSIS — C787 Secondary malignant neoplasm of liver and intrahepatic bile duct: Secondary | ICD-10-CM

## 2023-08-19 DIAGNOSIS — Z5111 Encounter for antineoplastic chemotherapy: Secondary | ICD-10-CM | POA: Diagnosis not present

## 2023-08-19 DIAGNOSIS — D390 Neoplasm of uncertain behavior of uterus: Secondary | ICD-10-CM | POA: Diagnosis not present

## 2023-08-19 NOTE — Progress Notes (Signed)
Gynecologic Oncology Consult Visit   Referring Provider: Dr Orlie Dakin  Chief Complaint: Uterine Masses in setting of Metastatic Colon Cancer  Subjective:  Alexis Garcia is a 62 y.o. female who is seen in consultation from Dr. Orlie Dakin female with medical history significant for leiomyoma, T2DM, HPTN, Hypothyroidism, multinodular goiter, COPD/asthma, CAD, diagnosed with metastatic colon cancer with incidental uterine masses on imaging.   She presented to ER on 9/24 for syncope and collapse. She had had multiple similar episodes including 2 on cruise ship. She was found to be anemic and was admitted to hospital. CTA demonstrated multiple pulmonary and hepatic nodules concerning for metastatic disease. CT A/P was obtained for staging which showed masses of uterus. She has history of fibroids.   02/16/2018- 1 week hx of AUB and cervical mass. On exam blood present, mass protruding from cervix, nodular, firm, ~2-3 cm. Uterus modbile, nontender, normal shape and size. Essentially she was extruding the leiomyoma vaginally. It sounds like Dr. Valentino Saxon removed the leiomyoma in clinic.   CERVIX, BIOPSY: BENIGN CERVICAL TISSUE WITH SUBMUCOSAL LEIOMYOMA. NEGATIVE FOR DYSPLASIA, VIRAL EFFECT, AND MALIGNANCY.   06/25/18- Dr Valentino Saxon- offered her conservative management and consideration of endometrial ablation. Started on Aygestin which improved her bleeding. She underwent D&C, Hysteroscopy with Minerva ablation with Dr Valentino Saxon.   DIAGNOSIS:  A. ENDOMETRIAL CURETTINGS:  - ENDOMETRIUM WITH CHRONIC ENDOMETRITIS, BREAKDOWN, AND CHANGES CONSISTENT WITH PROGESTIN EFFECT.  - ENDOCERVICAL GLANDULAR EPITHELIUM WITH SQUAMOUS METAPLASIA.  - SQUAMOUS MUCOSA WITH ACUTE INFLAMMATION.  - NEGATIVE FOR ATYPIA AND MALIGNANCY.   Colposcopy in February 2024 appears with no evidence of malignancy or dysplasia.  A. ENDOCERVICAL, CURETTAGE:  - Cervical glandular mucosa with metaplastic changes and mild atypia  - Negative for dysplasia  or malignancy   She never had any further bleeding and she remained asymptomatic from her leiomyoma. Unfortunately she was diagnosed with metastatic colorectal cancer and on imaging seen to have the enlarged uterus with multiple masses.   08/05/23- CT A/P w/ contrast 1. Large cecal and inferior right colon mass extending into the ileocecal valve and distal ileum with high-grade luminal stenosis and partial distal small bowel obstruction. This is also extending into the appendix and is compatible with a primary colon carcinoma. 2. Multiple hepatic metastases. 3. Multiple metastatic mesenteric and retroperitoneal nodes. 4. Multiple pulmonary metastases. 5. Enlarged uterus containing multiple masses, 1 of which is densely calcified. The ovaries are not identified separate from these masses and there is some loculated inferior pelvic fluid posteriorly, primarily on the left. These findings are nonspecific and could be due to uterine fibroids and possible primary or metastatic ovarian neoplasms. 6. 3 ventral hernias containing herniated fat.  08/06/23  1. Liver, biopsy, Right lobe 5.5 cm :       ADENOCARCINOMA CONSISTENT WITH METASTATIC ADENOCARCINOMA.   CEA - 1440 CA 125- 57  She underwent colonoscopy which revealed complete obstruction of colon. Biopsy obtained:   08/12/23  1. Cecum Biopsy, CBX :       - INVASIVE MODERATELY DIFFERENTIATED ADENOCARCINOMA    She presents today for evaluation. She came to clinic with her daughter.    Problem List: Patient Active Problem List   Diagnosis Date Noted   Primary adenocarcinoma of ascending colon (HCC) 08/17/2023   Syncope 08/04/2023   Normocytic anemia 08/04/2023   Diabetes (HCC) 05/17/2019   Essential hypertension 05/17/2019   Carotid stenosis 05/17/2019   Vision change - seeing shadows purple gray in left eye  09/24/2018   Acute pain in  left eye 09/24/2018   Uterine leiomyoma 05/18/2018   Abnormal perimenopausal bleeding 05/18/2018     Past Medical History: Past Medical History:  Diagnosis Date   Abnormal uterine bleeding    Asthma    Cardiomegaly    Cardiovascular disease    COPD (chronic obstructive pulmonary disease) (HCC)    Diabetes mellitus without complication (HCC)    Goiter, nontoxic, multinodular    Hypertension    Hypothyroidism    Thyrotoxicosis     Past Surgical History: Past Surgical History:  Procedure Laterality Date   BIOPSY  08/12/2023   Procedure: BIOPSY;  Surgeon: Norma Fredrickson, Boykin Nearing, MD;  Location: [facility] ENDOSCOPY;  Service: Gastroenterology;;   CESAREAN SECTION     COLONOSCOPY N/A 08/12/2023   Procedure: COLONOSCOPY;  Surgeon: Toledo, Boykin Nearing, MD;  Location: [facility] ENDOSCOPY;  Service: Gastroenterology;  Laterality: N/A;  3rd patient   COLONOSCOPY WITH PROPOFOL N/A 10/29/2015   Procedure: COLONOSCOPY WITH PROPOFOL;  Surgeon: Elnita Maxwell, MD;  Location: [facility] ENDOSCOPY;  Service: Endoscopy;  Laterality: N/A;   DILITATION & CURRETTAGE/HYSTROSCOPY WITH NOVASURE ABLATION N/A 06/25/2018   Procedure: DILATATION & CURETTAGE/HYSTEROSCOPY WITH MINERVA ABLATION;  Surgeon: Hildred Laser, MD;  Location: [facility] ORS;  Service: Gynecology;  Laterality: N/A;   SUBMUCOSAL TATTOO INJECTION  08/12/2023   Procedure: SUBMUCOSAL TATTOO INJECTION;  Surgeon: Toledo, Boykin Nearing, MD;  Location: [facility] ENDOSCOPY;  Service: Gastroenterology;;   TUBAL LIGATION      Past Gynecologic History:  Menarche: 12 Menstrual details: h/o abnormal menses Last Menstrual Period: postmenopausal. No cycles since ablation See HPI  OB History:  OB History  Gravida Para Term Preterm AB Living  4 3 2 1 1     SAB IAB Ectopic Multiple Live Births  1            # Outcome Date GA Lbr Len/2nd Weight Sex Type Anes PTL Lv  4 Term 1995    F Vag-Spont     3 Term 46    M CS-Unspec     2 SAB 1985             Birth Comments: miscarriage  1 Preterm 89    F Vag-Spont       Family History: Family History  Adopted: Yes   Problem Relation Age of Onset   Breast cancer Neg Hx     Social History: Social History   Socioeconomic History   Marital status: Divorced    Spouse name: Not on file   Number of children: Not on file   Years of education: Not on file   Highest education level: Not on file  Occupational History   Not on file  Tobacco Use   Smoking status: Former    Current packs/day: 0.00    Types: Cigarettes    Quit date: 2024    Years since quitting: 0.7   Smokeless tobacco: Never  Vaping Use   Vaping status: Former  Substance and Sexual Activity   Alcohol use: Never   Drug use: Never   Sexual activity: Not Currently    Birth control/protection: Post-menopausal, None  Other Topics Concern   Not on file  Social History Narrative   Son lives with patient   Social Determinants of Health   Financial Resource Strain: Not on file  Food Insecurity: No Food Insecurity (08/04/2023)   Hunger Vital Sign    Worried About Running Out of Food in the Last Year: Never true    Ran Out of Food in the Last  Year: Never true  Transportation Needs: No Transportation Needs (08/04/2023)   PRAPARE - Administrator, Civil Service (Medical): No    Lack of Transportation (Non-Medical): No  Physical Activity: Not on file  Stress: Not on file  Social Connections: Not on file  Intimate Partner Violence: Not At Risk (08/04/2023)   Humiliation, Afraid, Rape, and Kick questionnaire    Fear of Current or Ex-Partner: No    Emotionally Abused: No    Physically Abused: No    Sexually Abused: No    Allergies: No Known Allergies  Current Medications: Current Outpatient Medications  Medication Sig Dispense Refill   albuterol (PROVENTIL HFA;VENTOLIN HFA) 108 (90 Base) MCG/ACT inhaler Inhale 2 puffs into the lungs every 6 (six) hours as needed (for exercise induced asthma).     atorvastatin (LIPITOR) 10 MG tablet SMARTSIG:1 Tablet(s) By Mouth Every Evening     cholecalciferol (VITAMIN D) 400 units  TABS tablet Take 400 Units by mouth daily.     ezetimibe (ZETIA) 10 MG tablet Take 10 mg by mouth daily.     levothyroxine (SYNTHROID) 75 MCG tablet Take 75 mcg by mouth daily.     lidocaine-prilocaine (EMLA) cream Apply to affected area once 30 g 3   metFORMIN (GLUCOPHAGE) 500 MG tablet Take 500 mg by mouth 2 (two) times daily.      metoprolol tartrate (LOPRESSOR) 25 MG tablet Take 25 mg by mouth 2 (two) times daily.     Multiple Vitamins-Minerals (ALIVE ONCE DAILY WOMENS PO) Take 2 tablets by mouth daily. Gummies     ondansetron (ZOFRAN) 8 MG tablet Take 1 tablet (8 mg total) by mouth every 8 (eight) hours as needed for nausea or vomiting. Start on the third day after chemotherapy. 60 tablet 1   polyethylene glycol (MIRALAX / GLYCOLAX) 17 g packet Take 17 g by mouth daily as needed for mild constipation. 14 each 0   prochlorperazine (COMPAZINE) 10 MG tablet Take 1 tablet (10 mg total) by mouth every 6 (six) hours as needed for nausea or vomiting. 60 tablet 1   RYBELSUS 7 MG TABS Take 1 tablet by mouth daily. (Patient not taking: Reported on 08/12/2023)     umeclidinium-vilanterol (ANORO ELLIPTA) 62.5-25 MCG/INH AEPB Inhale 1 puff into the lungs daily as needed. (Patient not taking: Reported on 08/05/2023)     No current facility-administered medications for this visit.    Review of Systems General: weight loss otherwise negative Skin: negative for changes in moles or sores or rash Eyes: negative for changes in vision HEENT: negative for change in hearing, tinnitus, voice changes Pulmonary: negative for dyspnea, orthopnea, productive cough, wheezing Cardiac: negative for palpitations, pain Gastrointestinal: negative for nausea, vomiting, constipation, diarrhea, hematemesis, hematochezia Genitourinary/Sexual: negative for dysuria, retention, hematuria, incontinence Ob/Gyn:  negative for abnormal bleeding, or pain Musculoskeletal: negative for pain, joint pain, back pain Hematology: negative  for easy bruising, abnormal bleeding Neurologic/Psych: negative for headaches, seizures, paralysis, weakness, numbness  Objective:  Physical Examination:  BP 94/64   Pulse 65   Temp (!) 96.2 F (35.7 C)   Resp 20   Wt 152 lb 8 oz (69.2 kg)   SpO2 100%   BMI 24.61 kg/m     ECOG Performance Status: 1 - Symptomatic but completely ambulatory  GENERAL: Patient is a thin appearing female in no acute distress HEENT:  atraumatic normocephalic LUNGS:  Normal respiratory effort ABDOMEN:  Soft, nontender. Nondistended. No masses or ascites. Positive umbilical hernia with 5 cm bulge.  The leiomyoma are not palpable.   EXTREMITIES:  No peripheral edema.   NEURO:  Nonfocal. Well oriented.  Appropriate affect.  Pelvic: chaperoned by CMA EGBUS: no lesions Cervix:  multiple nabothian cysts, nontender, mobile Vagina: no lesions, no discharge or bleeding Uterus: enlarged and irregular with known fibroids. Unable to determine size.  Adnexa: limited exam due to fibroids. Very firm posteriorly which could be posterior fibroids encroaching on the rectum.  Rectovaginal: deferred due to discomfort   Lab Review N/a  Radiologic Imaging: FINDINGS: Lower chest: Previously described lung nodules.  Normal-sized heart.   Hepatobiliary: Multiple rounded and oval, heterogeneous, predominantly low-density masses throughout the liver. An index mass in the caudate lobe measures 6.6 x 4.0 cm on image number 22/2. Normal-appearing gallbladder.   Pancreas: Unremarkable. No pancreatic ductal dilatation or surrounding inflammatory changes.   Spleen: Normal in size without focal abnormality.   Adrenals/Urinary Tract: Normal. Adrenal glands. Small bilateral simple appearing renal cysts. These do not need imaging follow-up. Unremarkable urinary bladder and visualized portions of the ureters. No hydronephrosis or urinary tract calculi.   Stomach/Bowel: Large, lobulated, cecal and inferior right colon mass.  This measures 6.6 x 5.0 cm on image number 56/2 and is causing high-grade luminal stenosis. This extends into the ileocecal valve and distal ileum with marked dilatation of the distal ileum with fecalized material in the lumen. There is some oral contrast which has passed through the lumen.   The stomach and proximal small bowel are unremarkable. The appendix is diffusely enlarged.   Vascular/Lymphatic: Multiple enlarged mesenteric and retroperitoneal nodes. An index left anterior para-aortic node has a short axis diameter of 15 mm on image number 47/2. Atheromatous celiac artery calcifications without aneurysm.   Reproductive: Enlarged uterus containing multiple masses, 1 of which is densely calcified. The ovaries are not identified separate from these masses in there is some lobulated inferior pelvic fluid posteriorly, primarily on the left.   Other: 3 ventral hernias containing hernia fat. 2.5 x 1.5 cm intramuscular lipoma anterior to the right hip.   Musculoskeletal: Bilateral sacroiliac degenerative changes. Lumbar and lower thoracic spine degenerative changes with lower thoracic spine changes of DISH. No evidence of bony metastatic disease.   IMPRESSION: 1. Large cecal and inferior right colon mass extending into the ileocecal valve and distal ileum with high-grade luminal stenosis and partial distal small bowel obstruction. This is also extending into the appendix and is compatible with a primary colon carcinoma. 2. Multiple hepatic metastases. 3. Multiple metastatic mesenteric and retroperitoneal nodes. 4. Multiple pulmonary metastases. 5. Enlarged uterus containing multiple masses, 1 of which is densely calcified. The ovaries are not identified separate from these masses and there is some loculated inferior pelvic fluid posteriorly, primarily on the left. These findings are nonspecific and could be due to uterine fibroids and possible primary or metastatic  ovarian neoplasms. 6. 3 ventral hernias containing herniated fat.     Electronically Signed   By: Beckie Salts M.D.   On: 08/05/2023 13:44   Assessment:  Alexis Garcia is a 62 y.o. female with known history of leiomyoma, asymptomatic.   Medical co-morbidities complicating care: metastatic adenocarcinoma; COPD, umbilical hernia Plan:   Problem List Items Addressed This Visit       Genitourinary   Uterine leiomyoma - Primary   Other Visit Diagnoses     Colon cancer metastasized to liver [facility])           Very interesting case. The pelvic exam was abnormal, however, it  is hard to determine if this was due to leiomyoma versus colon involvement. I reviewed the colonoscopy report and photos as well as the scan results. It is very rare for fibroids to become malignant and if they are it is typically a sarcoma that does fit the type of malignancy found in her case. She will continue to follow up with Dr. Orlie Dakin.   The patient's diagnosis, an outline of the further diagnostic and laboratory studies which will be required, the recommendation for surgery, and alternatives were discussed with her and her accompanying family members.  All questions were answered to their satisfaction.  A total of 45 minutes were spent with the patient/family today; >50% was spent in education, counseling and coordination of care for leiomyoma.   Angeles Leta Jungling, MD

## 2023-08-20 ENCOUNTER — Ambulatory Visit: Payer: BC Managed Care – PPO | Admitting: Radiology

## 2023-08-20 NOTE — Progress Notes (Signed)
Pharmacist Chemotherapy Monitoring - Initial Assessment    Anticipated start date: 08/31/23   The following has been reviewed per standard work regarding the patient's treatment regimen: The patient's diagnosis, treatment plan and drug doses, and organ/hematologic function Lab orders and baseline tests specific to treatment regimen  The treatment plan start date, drug sequencing, and pre-medications Prior authorization status  Patient's documented medication list, including drug-drug interaction screen and prescriptions for anti-emetics and supportive care specific to the treatment regimen The drug concentrations, fluid compatibility, administration routes, and timing of the medications to be used The patient's access for treatment and lifetime cumulative dose history, if applicable  The patient's medication allergies and previous infusion related reactions, if applicable   Changes made to treatment plan:  N/A  Follow up needed:  Pending port placement; assess  timing re: starting Bevacizumab   Ebony Hail, Pharm.D., CPP 08/20/2023@1 :31 PM

## 2023-08-21 ENCOUNTER — Ambulatory Visit: Payer: Self-pay | Admitting: General Surgery

## 2023-08-22 NOTE — Anesthesia Postprocedure Evaluation (Addendum)
Anesthesia Post Note  Patient: Alexis Garcia  Procedure(s) Performed: COLONOSCOPY SUBMUCOSAL TATTOO INJECTION BIOPSY  Patient location during evaluation: Endoscopy Anesthesia Type: General Level of consciousness: awake and alert Pain management: pain level controlled Vital Signs Assessment: post-procedure vital signs reviewed and stable Respiratory status: spontaneous breathing, nonlabored ventilation, respiratory function stable and patient connected to nasal cannula oxygen Cardiovascular status: blood pressure returned to baseline and stable Postop Assessment: no apparent nausea or vomiting Anesthetic complications: no   No notable events documented.   Last Vitals:  Vitals:   08/12/23 1027 08/12/23 1037  BP:    Pulse:    Resp: 16 16  Temp:    SpO2:      Last Pain:  Vitals:   08/12/23 1037  TempSrc:   PainSc: 0-No pain                 Lenard Simmer

## 2023-08-24 ENCOUNTER — Ambulatory Visit: Payer: Self-pay | Admitting: General Surgery

## 2023-08-24 ENCOUNTER — Encounter
Admission: RE | Admit: 2023-08-24 | Discharge: 2023-08-24 | Disposition: A | Discharge status: Home / Self Care | Payer: BC Managed Care – PPO | Source: Ambulatory Visit | Attending: General Surgery | Admitting: General Surgery

## 2023-08-24 DIAGNOSIS — Z01812 Encounter for preprocedural laboratory examination: Secondary | ICD-10-CM

## 2023-08-24 DIAGNOSIS — E119 Type 2 diabetes mellitus without complications: Secondary | ICD-10-CM

## 2023-08-24 DIAGNOSIS — D649 Anemia, unspecified: Secondary | ICD-10-CM

## 2023-08-24 HISTORY — DX: Cardiac murmur, unspecified: R01.1

## 2023-08-24 HISTORY — DX: Anxiety disorder, unspecified: F41.9

## 2023-08-24 HISTORY — DX: Depression, unspecified: F32.A

## 2023-08-24 HISTORY — DX: Anemia, unspecified: D64.9

## 2023-08-24 HISTORY — DX: Leiomyoma of uterus, unspecified: D25.9

## 2023-08-24 HISTORY — DX: Malignant neoplasm of ascending colon: C18.2

## 2023-08-24 HISTORY — DX: Malignant neoplasm of unspecified part of unspecified bronchus or lung: C34.90

## 2023-08-24 HISTORY — DX: Dyspnea, unspecified: R06.00

## 2023-08-24 HISTORY — DX: Syncope and collapse: R55

## 2023-08-24 NOTE — H&P (View-Only) (Signed)
PATIENT PROFILE: Alexis Garcia is a 62 y.o. female who presents to the Clinic for consultation at the request of Dr. Orlie Dakin for evaluation of malignant neoplasm of the ascending colon.   PCP:  Alexis Gang, NP   HISTORY OF PRESENT ILLNESS: Alexis Garcia reports patient was found with large mass of the ascending colon.  She was also found with liver masses.  Biopsy of the colon shows adenocarcinoma of the ascending colon.  Biopsy of the liver masses showed metastatic adenocarcinoma.  Patient denies abdominal pain.  Patient endorses tolerating diet.  Patient having regular bowel movement.  She denies any abdominal distention, nausea or vomiting.  No pain radiation.  Patient cannot identify any alleviating or aggravating factors.  Complete workup with CT scan of the abdomen and pelvis as described above.  Patient also has CT scan of the chest with concern of multiple pulmonary nodules for metastatic disease.     PROBLEM LIST: Malignant neoplasm of the ascending colon stage IV   GENERAL REVIEW OF SYSTEMS:    General ROS: negative for - chills, fatigue, fever, weight gain or weight loss Allergy and Immunology ROS: negative for - hives  Hematological and Lymphatic ROS: negative for - bleeding problems or bruising, negative for palpable nodes Endocrine ROS: negative for - heat or cold intolerance, hair changes Respiratory ROS: negative for - cough, shortness of breath or wheezing Cardiovascular ROS: no chest pain or palpitations GI ROS: negative for nausea, vomiting, abdominal pain, diarrhea, constipation Musculoskeletal ROS: negative for - joint swelling or muscle pain Neurological ROS: negative for - confusion, syncope Dermatological ROS: negative for pruritus and rash Psychiatric: negative for anxiety, depression, difficulty sleeping and memory loss   MEDICATIONS: Current Medications        Current Outpatient Medications  Medication Sig Dispense Refill   albuterol 90 mcg/actuation  inhaler Inhale 2 inhalations into the lungs every 6 (six) hours as needed for Wheezing.       atorvastatin (LIPITOR) 10 MG tablet Take 1 tablet by mouth every evening       cholecalciferol (VITAMIN D3) 400 unit tablet Take 400 Units by mouth once daily       levothyroxine (SYNTHROID) 75 MCG tablet Take 75 mcg by mouth once daily       metFORMIN (GLUCOPHAGE) 500 MG tablet Take 500 mg by mouth 2 (two) times daily with meals.       polyethylene glycol (MIRALAX) packet Take 17 g by mouth once daily as needed       PROPRANOLOL HCL (PROPRANOLOL ORAL) Take 1 tablet by mouth 3 (three) times a day.       hydroCHLOROthiazide (HYDRODIURIL) 25 MG tablet Take 25 mg by mouth once daily. (Patient not taking: Reported on 08/20/2023)       lisinopril (PRINIVIL,ZESTRIL) 2.5 MG tablet Take 2.5 mg by mouth once daily. (Patient not taking: Reported on 08/20/2023)       sodium, potassium, and magnesium (SUPREP) oral solution Take as directed for colon prep. (Patient not taking: Reported on 08/20/2023) 354 mL 0   sodium, potassium, and magnesium (SUPREP) oral solution Take 1 Bottle by mouth as directed One kit contains 2 bottles.  Take both bottles at the times instructed by your provider. (Patient not taking: Reported on 08/20/2023) 354 mL 0    No current facility-administered medications for this visit.        ALLERGIES: Patient has no known allergies.   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 Nena Garcia @ [facility] - Tubulovillous adenoma w/high gr  dysplasia 3 mth rpt per [facility]    Colon @ [facility]   08/12/2023    Invasive moderately differentiated adenocarcinoma   CESAREAN SECTION            FAMILY HISTORY: Family History  No family history on file.      SOCIAL HISTORY: Social History  Social History        Socioeconomic History   Marital status: Single  Tobacco Use   Smoking status: Never   Smokeless tobacco: Never  Vaping Use   Vaping status: Never Used    Social Drivers of Health        Food Insecurity: No Food Insecurity (08/04/2023)    Received from [facility]    Hunger Vital Sign     Worried About Running Out of Food in the Last Year: Never true     Ran Out of Food in the Last Year: Never true  Transportation Needs: No Transportation Needs (08/04/2023)    Received from [facility] - Transportation     Lack of Transportation (Medical): No     Lack of Transportation (Non-Medical): No        PHYSICAL EXAM:    Vitals:    08/20/23 1348  BP: (!) 87/58  Pulse: 74    Body mass index is 24.13 kg/m. Weight: 68.9 kg (151 lb 12.8 oz)    GENERAL: Alert, active, oriented x3   HEENT: Pupils equal reactive to light. Extraocular movements are intact. Sclera clear. Palpebral conjunctiva normal red color.Pharynx clear.   NECK: Supple with no palpable mass and no adenopathy.   LUNGS: Sound clear with no rales rhonchi or wheezes.   HEART: Regular rhythm S1 and S2 without murmur.     EXTREMITIES: Well-developed well-nourished symmetrical with no dependent edema.   NEUROLOGICAL: Awake alert oriented, facial expression symmetrical, moving all extremities.   REVIEW OF DATA: I have reviewed the following data today: No visits with results within 3 Month(s) from this visit.  Latest known visit with results is:  No results found for any previous visit.      ASSESSMENT: Alexis Garcia is a 62 y.o. female presenting for consultation for malignant neoplasm of the ascending colon.   Patient with stage IV  malignant neoplasm of the ascending colon.  She has a large mass at the cecum.  Currently without any sign of obstruction.  Patient tolerating diet.  Abdominal exam benign.  Patient having bowel movement.  We discussed about possible palliative surgery if she does not respond to chemotherapy.  Patient was evaluated by medical oncology and they recommended to proceed with chemotherapy.  She is here today for discussion of insertion of Port-A-Cath.  We discussed about the procedure.  We discussed about the benefits and the risk of surgery including bleeding, infection, pneumothorax, hemothorax, arteriovenous fistula, infection of the catheter, malfunction among others.  The patient reports she understood and agreed to proceed with insertion of Port-A-Cath.   The patient understand that if she develops any obstructive symptoms she might need a diverting ostomy versus hemicolectomy.   Malignant neoplasm of ascending colon (CMS/HHS-HCC) [C18.2]   PLAN: 1. Insertion of Port a Cath [phone], [id number], [id number]) 2. Do not take aspirin 5 days before surgery 3. Contact us if has any question or concern.   Patient verbalized understanding, all questions were answered, and were agreeable with the plan outlined above.        Carolan Shiver, MD   Electronically signed by Carolan Shiver, MD

## 2023-08-24 NOTE — H&P (Signed)
PATIENT PROFILE: Alexis Garcia is a 62 y.o. female who presents to the Clinic for consultation at the request of Dr. Orlie Dakin for evaluation of malignant neoplasm of the ascending colon.   PCP:  Armando Gang, NP   HISTORY OF PRESENT ILLNESS: Alexis Garcia reports patient was found with large mass of the ascending colon.  She was also found with liver masses.  Biopsy of the colon shows adenocarcinoma of the ascending colon.  Biopsy of the liver masses showed metastatic adenocarcinoma.  Patient denies abdominal pain.  Patient endorses tolerating diet.  Patient having regular bowel movement.  She denies any abdominal distention, nausea or vomiting.  No pain radiation.  Patient cannot identify any alleviating or aggravating factors.  Complete workup with CT scan of the abdomen and pelvis as described above.  Patient also has CT scan of the chest with concern of multiple pulmonary nodules for metastatic disease.     PROBLEM LIST: Malignant neoplasm of the ascending colon stage IV   GENERAL REVIEW OF SYSTEMS:    General ROS: negative for - chills, fatigue, fever, weight gain or weight loss Allergy and Immunology ROS: negative for - hives  Hematological and Lymphatic ROS: negative for - bleeding problems or bruising, negative for palpable nodes Endocrine ROS: negative for - heat or cold intolerance, hair changes Respiratory ROS: negative for - cough, shortness of breath or wheezing Cardiovascular ROS: no chest pain or palpitations GI ROS: negative for nausea, vomiting, abdominal pain, diarrhea, constipation Musculoskeletal ROS: negative for - joint swelling or muscle pain Neurological ROS: negative for - confusion, syncope Dermatological ROS: negative for pruritus and rash Psychiatric: negative for anxiety, depression, difficulty sleeping and memory loss   MEDICATIONS: Current Medications        Current Outpatient Medications  Medication Sig Dispense Refill   albuterol 90 mcg/actuation  inhaler Inhale 2 inhalations into the lungs every 6 (six) hours as needed for Wheezing.       atorvastatin (LIPITOR) 10 MG tablet Take 1 tablet by mouth every evening       cholecalciferol (VITAMIN D3) 400 unit tablet Take 400 Units by mouth once daily       levothyroxine (SYNTHROID) 75 MCG tablet Take 75 mcg by mouth once daily       metFORMIN (GLUCOPHAGE) 500 MG tablet Take 500 mg by mouth 2 (two) times daily with meals.       polyethylene glycol (MIRALAX) packet Take 17 g by mouth once daily as needed       PROPRANOLOL HCL (PROPRANOLOL ORAL) Take 1 tablet by mouth 3 (three) times a day.       hydroCHLOROthiazide (HYDRODIURIL) 25 MG tablet Take 25 mg by mouth once daily. (Patient not taking: Reported on 08/20/2023)       lisinopril (PRINIVIL,ZESTRIL) 2.5 MG tablet Take 2.5 mg by mouth once daily. (Patient not taking: Reported on 08/20/2023)       sodium, potassium, and magnesium (SUPREP) oral solution Take as directed for colon prep. (Patient not taking: Reported on 08/20/2023) 354 mL 0   sodium, potassium, and magnesium (SUPREP) oral solution Take 1 Bottle by mouth as directed One kit contains 2 bottles.  Take both bottles at the times instructed by your provider. (Patient not taking: Reported on 08/20/2023) 354 mL 0    No current facility-administered medications for this visit.        ALLERGIES: Patient has no known allergies.   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 Nena Alexander @ [facility] - Tubulovillous adenoma w/high gr  dysplasia 3 mth rpt per [facility]    Colon @ [facility]   08/12/2023    Invasive moderately differentiated adenocarcinoma   CESAREAN SECTION            FAMILY HISTORY: Family History  No family history on file.      SOCIAL HISTORY: Social History  Social History        Socioeconomic History   Marital status: Single  Tobacco Use   Smoking status: Never   Smokeless tobacco: Never  Vaping Use   Vaping status: Never Used    Social Drivers of Health        Food Insecurity: No Food Insecurity (08/04/2023)    Received from [facility]    Hunger Vital Sign     Worried About Running Out of Food in the Last Year: Never true     Ran Out of Food in the Last Year: Never true  Transportation Needs: No Transportation Needs (08/04/2023)    Received from [facility] - Transportation     Lack of Transportation (Medical): No     Lack of Transportation (Non-Medical): No        PHYSICAL EXAM:    Vitals:    08/20/23 1348  BP: (!) 87/58  Pulse: 74    Body mass index is 24.13 kg/m. Weight: 68.9 kg (151 lb 12.8 oz)    GENERAL: Alert, active, oriented x3   HEENT: Pupils equal reactive to light. Extraocular movements are intact. Sclera clear. Palpebral conjunctiva normal red color.Pharynx clear.   NECK: Supple with no palpable mass and no adenopathy.   LUNGS: Sound clear with no rales rhonchi or wheezes.   HEART: Regular rhythm S1 and S2 without murmur.     EXTREMITIES: Well-developed well-nourished symmetrical with no dependent edema.   NEUROLOGICAL: Awake alert oriented, facial expression symmetrical, moving all extremities.   REVIEW OF DATA: I have reviewed the following data today: No visits with results within 3 Month(s) from this visit.  Latest known visit with results is:  No results found for any previous visit.      ASSESSMENT: Alexis Garcia is a 62 y.o. female presenting for consultation for malignant neoplasm of the ascending colon.   Patient with stage IV  malignant neoplasm of the ascending colon.  She has a large mass at the cecum.  Currently without any sign of obstruction.  Patient tolerating diet.  Abdominal exam benign.  Patient having bowel movement.  We discussed about possible palliative surgery if she does not respond to chemotherapy.  Patient was evaluated by medical oncology and they recommended to proceed with chemotherapy.  She is here today for discussion of insertion of Port-A-Cath.  We discussed about the procedure.  We discussed about the benefits and the risk of surgery including bleeding, infection, pneumothorax, hemothorax, arteriovenous fistula, infection of the catheter, malfunction among others.  The patient reports she understood and agreed to proceed with insertion of Port-A-Cath.   The patient understand that if she develops any obstructive symptoms she might need a diverting ostomy versus hemicolectomy.   Malignant neoplasm of ascending colon (CMS/HHS-HCC) [C18.2]   PLAN: 1. Insertion of Port a Cath [phone], [id number], [id number]) 2. Do not take aspirin 5 days before surgery 3. Contact us if has any question or concern.   Patient verbalized understanding, all questions were answered, and were agreeable with the plan outlined above.        Carolan Shiver, MD   Electronically signed by Carolan Shiver, MD

## 2023-08-24 NOTE — Patient Instructions (Addendum)
Your procedure is scheduled on: 08/26/23 - Wednesday Report to the Registration Desk on the 1st floor of the [facility]. To find out your arrival time, please call [phone] between 1PM - 3PM on: 08/25/23 - Tuesday If your arrival time is 6:00 am, do not arrive before that time as the [facility] entrance doors do not open until 6:00 am. [facility] to [facility] on 08/25/23 AT 9:30 AM - [address] FOR LABS.  REMEMBER: Instructions that are not followed completely may result in serious medical risk, up to and including death; or upon the discretion of your surgeon and anesthesiologist your surgery may need to be rescheduled.  Do not eat food or drink any liquids after midnight the night before surgery.  No gum chewing or hard candies.   One week prior to surgery: Stop Anti-inflammatories (NSAIDS) such as Advil, Aleve, Ibuprofen, Motrin, Naproxen, Naprosyn and Aspirin based products such as Excedrin, Goody's Powder, BC Powder. You may take Tylenol if needed for pain up until the day of surgery.  Stop ANY OVER THE COUNTER supplements until after surgery.  You may however, continue to take Tylenol if needed for pain up until the day of surgery.   ON THE DAY OF SURGERY ONLY TAKE THESE MEDICATIONS WITH SIPS OF WATER:  levothyroxine (SYNTHROID)   Use inhaler albuterol (PROVENTIL  on the day of surgery and bring to the hospital.   No Alcohol for 24 hours before or after surgery.  No Smoking including e-cigarettes for 24 hours before surgery.  No chewable tobacco products for at least 6 hours before surgery.  No nicotine patches on the day of surgery.  Do not use any "recreational" drugs for at least a week (preferably 2 weeks) before your surgery.  Please be advised that the combination of cocaine and anesthesia may have negative outcomes, up to and including death. If you test positive for cocaine, your surgery will be cancelled.  On the morning of  surgery brush your teeth with toothpaste and water, you may rinse your mouth with mouthwash if you wish. Do not swallow any toothpaste or mouthwash.  Use CHG Soap or wipes as directed on instruction sheet.  Do not wear jewelry, make-up, hairpins, clips or nail polish.  For welded (permanent) jewelry: bracelets, anklets, waist bands, etc.  Please have this removed prior to surgery.  If it is not removed, there is a chance that hospital personnel will need to cut it off on the day of surgery.  Do not wear lotions, powders, or perfumes.   Do not shave body hair from the neck down 48 hours before surgery.  Contact lenses, hearing aids and dentures may not be worn into surgery.  Do not bring valuables to the hospital. [facility] is not responsible for any missing/lost belongings or valuables.   Notify your doctor if there is any change in your medical condition (cold, fever, infection).  Wear comfortable clothing (specific to your surgery type) to the hospital.  After surgery, you can help prevent lung complications by doing breathing exercises.  Take deep breaths and cough every 1-2 hours. Your doctor may order a device called an Incentive Spirometer to help you take deep breaths. When coughing or sneezing, hold a pillow firmly against your incision with both hands. This is called "splinting." Doing this helps protect your incision. It also decreases belly discomfort.  If you are being admitted to the hospital overnight, leave your suitcase in the car. After  surgery it may be brought to your room.  In case of increased patient census, it may be necessary for you, the patient, to continue your postoperative care in the Same Day Surgery department.  If you are being discharged the day of surgery, you will not be allowed to drive home. You will need a responsible individual to drive you home and stay with you for 24 hours after surgery.   If you are taking public transportation, you will  need to have a responsible individual with you.  Please call the Pre-admissions Testing Dept. at [phone] if you have any questions about these instructions.  Surgery Visitation Policy:  Patients having surgery or a procedure may have two visitors.  Children under the age of 93 must have an adult with them who is not the patient.  Inpatient Visitation:    Visiting hours are 7 a.m. to 8 p.m. Up to four visitors are allowed at one time in a patient room. The visitors may rotate out with other people during the day.  One visitor age 38 or older may stay with the patient overnight and must be in the room by 8 p.m.     Preparing for Surgery with CHLORHEXIDINE GLUCONATE (CHG) Soap  Chlorhexidine Gluconate (CHG) Soap  o An antiseptic cleaner that kills germs and bonds with the skin to continue killing germs even after washing  o Used for showering the night before surgery and morning of surgery  Before surgery, you can play an important role by reducing the number of germs on your skin.  CHG (Chlorhexidine gluconate) soap is an antiseptic cleanser which kills germs and bonds with the skin to continue killing germs even after washing.  Please do not use if you have an allergy to CHG or antibacterial soaps. If your skin becomes reddened/irritated stop using the CHG.  1. Shower the NIGHT BEFORE SURGERY and the MORNING OF SURGERY with CHG soap.  2. If you choose to wash your hair, wash your hair first as usual with your normal shampoo.  3. After shampooing, rinse your hair and body thoroughly to remove the shampoo.  4. Use CHG as you would any other liquid soap. You can apply CHG directly to the skin and wash gently with a scrungie or a clean washcloth.  5. Apply the CHG soap to your body only from the neck down. Do not use on open wounds or open sores. Avoid contact with your eyes, ears, mouth, and genitals (private parts). Wash face and genitals (private parts) with your normal  soap.  6. Wash thoroughly, paying special attention to the area where your surgery will be performed.  7. Thoroughly rinse your body with warm water.  8. Do not shower/wash with your normal soap after using and rinsing off the CHG soap.  9. Pat yourself dry with a clean towel.  10. Wear clean pajamas to bed the night before surgery.  12. Place clean sheets on your bed the night of your first shower and do not sleep with pets.  13. Shower again with the CHG soap on the day of surgery prior to arriving at the hospital.  14. Do not apply any deodorants/lotions/powders.  15. Please wear clean clothes to the hospital.  Preparing the Skin Before Surgery     To help prevent the risk of infection at your surgical site, we are now providing you with rinse-free Sage 2% Chlorhexidine Gluconate (CHG) disposable wipes.  Chlorhexidine Gluconate (CHG) Soap  o An antiseptic  cleaner that kills germs and bonds with the skin to continue killing germs even after washing  o Used for showering the night before surgery and morning of surgery  The night before surgery: Shower or bathe with warm water. Do not apply perfume, lotions, powders. Wait one hour after shower. Skin should be dry and cool. Open Sage wipe package - use 6 disposable cloths. Wipe body using one cloth for the right arm, one cloth for the left arm, one cloth for the right leg, one cloth for the left leg, one cloth for the chest/abdomen area, and one cloth for the back. Do not use on open wounds or sores. Do not use on face or genitals (private parts). If you are breast feeding, do not use on breasts. 5. Do not rinse, allow to dry. 6. Skin may feel "tacky" for several minutes. 7. Dress in clean clothes. 8. Place clean sheets on your bed and do not sleep with pets.  REPEAT ABOVE ON THE MORNING OF SURGERY BEFORE ARRIVING TO THE HOSPITAL.

## 2023-08-25 ENCOUNTER — Encounter
Admission: RE | Admit: 2023-08-25 | Discharge: 2023-08-25 | Disposition: A | Discharge status: Home / Self Care | Payer: BC Managed Care – PPO | Source: Ambulatory Visit | Attending: General Surgery | Admitting: General Surgery

## 2023-08-25 DIAGNOSIS — E119 Type 2 diabetes mellitus without complications: Secondary | ICD-10-CM | POA: Diagnosis not present

## 2023-08-25 DIAGNOSIS — E039 Hypothyroidism, unspecified: Secondary | ICD-10-CM | POA: Diagnosis not present

## 2023-08-25 DIAGNOSIS — D649 Anemia, unspecified: Secondary | ICD-10-CM | POA: Insufficient documentation

## 2023-08-25 DIAGNOSIS — C182 Malignant neoplasm of ascending colon: Secondary | ICD-10-CM | POA: Insufficient documentation

## 2023-08-25 DIAGNOSIS — J4489 Other specified chronic obstructive pulmonary disease: Secondary | ICD-10-CM | POA: Diagnosis not present

## 2023-08-25 DIAGNOSIS — Z7984 Long term (current) use of oral hypoglycemic drugs: Secondary | ICD-10-CM | POA: Diagnosis not present

## 2023-08-25 DIAGNOSIS — Z87891 Personal history of nicotine dependence: Secondary | ICD-10-CM | POA: Diagnosis not present

## 2023-08-25 DIAGNOSIS — C787 Secondary malignant neoplasm of liver and intrahepatic bile duct: Secondary | ICD-10-CM | POA: Diagnosis not present

## 2023-08-25 DIAGNOSIS — I10 Essential (primary) hypertension: Secondary | ICD-10-CM | POA: Diagnosis not present

## 2023-08-25 DIAGNOSIS — Z452 Encounter for adjustment and management of vascular access device: Secondary | ICD-10-CM | POA: Diagnosis present

## 2023-08-25 DIAGNOSIS — Z01812 Encounter for preprocedural laboratory examination: Secondary | ICD-10-CM | POA: Insufficient documentation

## 2023-08-25 DIAGNOSIS — Z79890 Hormone replacement therapy (postmenopausal): Secondary | ICD-10-CM | POA: Diagnosis not present

## 2023-08-25 LAB — TYPE AND SCREEN
ABO/RH(D): B POS
Antibody Screen: NEGATIVE

## 2023-08-25 LAB — CBC
HCT: 26.5 % — ABNORMAL LOW (ref 36.0–46.0)
Hemoglobin: 8.3 g/dL — ABNORMAL LOW (ref 12.0–15.0)
MCH: 25.7 pg — ABNORMAL LOW (ref 26.0–34.0)
MCHC: 31.3 g/dL (ref 30.0–36.0)
MCV: 82 fL (ref 80.0–100.0)
Platelets: 706 10*3/uL — ABNORMAL HIGH (ref 150–400)
RBC: 3.23 MIL/uL — ABNORMAL LOW (ref 3.87–5.11)
RDW: 16.6 % — ABNORMAL HIGH (ref 11.5–15.5)
WBC: 10 10*3/uL (ref 4.0–10.5)
nRBC: 0 % (ref 0.0–0.2)

## 2023-08-26 ENCOUNTER — Ambulatory Visit
Admission: RE | Admit: 2023-08-26 | Discharge: 2023-08-26 | Disposition: A | Discharge status: Home / Self Care | Payer: BC Managed Care – PPO | Attending: General Surgery | Admitting: General Surgery

## 2023-08-26 ENCOUNTER — Ambulatory Visit: Payer: BC Managed Care – PPO

## 2023-08-26 ENCOUNTER — Other Ambulatory Visit: Payer: Self-pay

## 2023-08-26 ENCOUNTER — Encounter: Payer: Self-pay | Admitting: General Surgery

## 2023-08-26 ENCOUNTER — Ambulatory Visit: Payer: BC Managed Care – PPO | Admitting: Urgent Care

## 2023-08-26 ENCOUNTER — Encounter
Admission: RE | Disposition: A | Discharge status: Home / Self Care | Payer: Self-pay | Source: Home / Self Care | Attending: General Surgery

## 2023-08-26 DIAGNOSIS — J4489 Other specified chronic obstructive pulmonary disease: Secondary | ICD-10-CM | POA: Insufficient documentation

## 2023-08-26 DIAGNOSIS — E039 Hypothyroidism, unspecified: Secondary | ICD-10-CM | POA: Insufficient documentation

## 2023-08-26 DIAGNOSIS — Z79890 Hormone replacement therapy (postmenopausal): Secondary | ICD-10-CM | POA: Insufficient documentation

## 2023-08-26 DIAGNOSIS — Z452 Encounter for adjustment and management of vascular access device: Secondary | ICD-10-CM | POA: Insufficient documentation

## 2023-08-26 DIAGNOSIS — D649 Anemia, unspecified: Secondary | ICD-10-CM

## 2023-08-26 DIAGNOSIS — Z7984 Long term (current) use of oral hypoglycemic drugs: Secondary | ICD-10-CM | POA: Insufficient documentation

## 2023-08-26 DIAGNOSIS — Z01812 Encounter for preprocedural laboratory examination: Secondary | ICD-10-CM

## 2023-08-26 DIAGNOSIS — C182 Malignant neoplasm of ascending colon: Secondary | ICD-10-CM | POA: Insufficient documentation

## 2023-08-26 DIAGNOSIS — C787 Secondary malignant neoplasm of liver and intrahepatic bile duct: Secondary | ICD-10-CM | POA: Insufficient documentation

## 2023-08-26 DIAGNOSIS — E119 Type 2 diabetes mellitus without complications: Secondary | ICD-10-CM | POA: Insufficient documentation

## 2023-08-26 DIAGNOSIS — I10 Essential (primary) hypertension: Secondary | ICD-10-CM | POA: Insufficient documentation

## 2023-08-26 DIAGNOSIS — Z87891 Personal history of nicotine dependence: Secondary | ICD-10-CM | POA: Insufficient documentation

## 2023-08-26 HISTORY — PX: PORTACATH PLACEMENT: SHX2246

## 2023-08-26 LAB — GLUCOSE, CAPILLARY
Glucose-Capillary: 72 mg/dL (ref 70–99)
Glucose-Capillary: 85 mg/dL (ref 70–99)

## 2023-08-26 SURGERY — INSERTION, TUNNELED CENTRAL VENOUS DEVICE, WITH PORT
Anesthesia: General | Site: Chest

## 2023-08-26 MED ORDER — MIDAZOLAM HCL 2 MG/2ML IJ SOLN
INTRAMUSCULAR | Status: DC | PRN
  Administered 2023-08-26 (×2): 1 mg via INTRAVENOUS

## 2023-08-26 MED ORDER — SODIUM CHLORIDE 0.9% FLUSH: 10.0000 mL | Freq: Two times a day (BID) | INTRAVENOUS | Status: DC

## 2023-08-26 MED ORDER — SODIUM CHLORIDE 0.9 % IV SOLN: INTRAVENOUS | Status: DC

## 2023-08-26 MED ORDER — SODIUM CHLORIDE (PF) 0.9 % IJ SOLN
INTRAMUSCULAR | Status: DC | PRN
  Administered 2023-08-26: 3 mL via INTRAVENOUS

## 2023-08-26 MED ORDER — SODIUM CHLORIDE 0.9 % IV SOLN
INTRAVENOUS | Status: DC | PRN
  Administered 2023-08-26: 20 mL via INTRAMUSCULAR

## 2023-08-26 MED ORDER — FENTANYL CITRATE (PF) 100 MCG/2ML IJ SOLN
INTRAMUSCULAR | Status: AC
  Filled 2023-08-26: qty 2

## 2023-08-26 MED ORDER — ACETAMINOPHEN 10 MG/ML IV SOLN: 1000.0000 mg | Freq: Once | INTRAVENOUS | Status: DC | PRN

## 2023-08-26 MED ORDER — MIDAZOLAM HCL 2 MG/2ML IJ SOLN
INTRAMUSCULAR | Status: AC
  Filled 2023-08-26: qty 2

## 2023-08-26 MED ORDER — FENTANYL CITRATE (PF) 100 MCG/2ML IJ SOLN
INTRAMUSCULAR | Status: DC | PRN
  Administered 2023-08-26: 25 ug via INTRAVENOUS

## 2023-08-26 MED ORDER — OXYCODONE HCL 5 MG/5ML PO SOLN: 5.0000 mg | Freq: Once | ORAL | Status: AC | PRN

## 2023-08-26 MED ORDER — PROPOFOL 10 MG/ML IV BOLUS
INTRAVENOUS | Status: AC
  Filled 2023-08-26: qty 40

## 2023-08-26 MED ORDER — ONDANSETRON HCL 4 MG/2ML IJ SOLN
INTRAMUSCULAR | Status: DC | PRN
  Administered 2023-08-26: 4 mg via INTRAVENOUS

## 2023-08-26 MED ORDER — CHLORHEXIDINE GLUCONATE 0.12 % MT SOLN
OROMUCOSAL | Status: AC
  Filled 2023-08-26: qty 15

## 2023-08-26 MED ORDER — OXYCODONE HCL 5 MG PO TABS
5.0000 mg | ORAL_TABLET | Freq: Once | ORAL | Status: AC | PRN
  Administered 2023-08-26: 5 mg via ORAL

## 2023-08-26 MED ORDER — CEFAZOLIN SODIUM-DEXTROSE 2-4 GM/100ML-% IV SOLN
2.0000 g | INTRAVENOUS | Status: AC
  Administered 2023-08-26: 2 g via INTRAVENOUS

## 2023-08-26 MED ORDER — CHLORHEXIDINE GLUCONATE 0.12 % MT SOLN
15.0000 mL | Freq: Once | OROMUCOSAL | Status: AC
  Administered 2023-08-26: 15 mL via OROMUCOSAL

## 2023-08-26 MED ORDER — ONDANSETRON HCL 4 MG/2ML IJ SOLN
INTRAMUSCULAR | Status: AC
  Filled 2023-08-26: qty 2

## 2023-08-26 MED ORDER — LIDOCAINE HCL (CARDIAC) PF 100 MG/5ML IV SOSY
PREFILLED_SYRINGE | INTRAVENOUS | Status: DC | PRN
  Administered 2023-08-26: 60 mg via INTRAVENOUS

## 2023-08-26 MED ORDER — HEPARIN SODIUM (PORCINE) 5000 UNIT/ML IJ SOLN
INTRAMUSCULAR | Status: AC
  Filled 2023-08-26: qty 1

## 2023-08-26 MED ORDER — HYDROCODONE-ACETAMINOPHEN 5-325 MG PO TABS: 1.0000 | ORAL_TABLET | ORAL | 0 refills | Status: AC | PRN

## 2023-08-26 MED ORDER — CEFAZOLIN SODIUM-DEXTROSE 2-4 GM/100ML-% IV SOLN
INTRAVENOUS | Status: AC
  Filled 2023-08-26: qty 100

## 2023-08-26 MED ORDER — PROPOFOL 500 MG/50ML IV EMUL
INTRAVENOUS | Status: DC | PRN
  Administered 2023-08-26: 50 ug/kg/min via INTRAVENOUS
  Administered 2023-08-26: 30 mg via INTRAVENOUS

## 2023-08-26 MED ORDER — OXYCODONE HCL 5 MG PO TABS
ORAL_TABLET | ORAL | Status: AC
  Filled 2023-08-26: qty 1

## 2023-08-26 MED ORDER — BUPIVACAINE-EPINEPHRINE (PF) 0.25% -1:200000 IJ SOLN
INTRAMUSCULAR | Status: DC | PRN
  Administered 2023-08-26: 9 mL

## 2023-08-26 MED ORDER — ONDANSETRON HCL 4 MG/2ML IJ SOLN: 4.0000 mg | Freq: Once | INTRAMUSCULAR | Status: DC | PRN

## 2023-08-26 MED ORDER — ORAL CARE MOUTH RINSE: 15.0000 mL | Freq: Once | OROMUCOSAL | Status: AC

## 2023-08-26 MED ORDER — FENTANYL CITRATE (PF) 100 MCG/2ML IJ SOLN
25.0000 ug | INTRAMUSCULAR | Status: DC | PRN
  Administered 2023-08-26 (×2): 25 ug via INTRAVENOUS

## 2023-08-26 MED ORDER — BUPIVACAINE-EPINEPHRINE (PF) 0.25% -1:200000 IJ SOLN
INTRAMUSCULAR | Status: AC
  Filled 2023-08-26: qty 30

## 2023-08-26 SURGICAL SUPPLY — 34 items
5 PAIRS OF YELLOW SUTURE CLAMP (MISCELLANEOUS) ×1
ADH SKN CLS APL DERMABOND .7 (GAUZE/BANDAGES/DRESSINGS) ×1
BAG DECANTER FOR FLEXI CONT (MISCELLANEOUS) ×1 IMPLANT
BLADE SURG 15 STRL LF DISP TIS (BLADE) ×1 IMPLANT
BLADE SURG 15 STRL SS (BLADE) ×1
BLADE SURG SZ11 CARB STEEL (BLADE) ×1 IMPLANT
BOOT SUTURE VASCULAR YLW (MISCELLANEOUS) ×1
CLAMP SUTURE YELLOW 5 PAIRS (MISCELLANEOUS) ×1 IMPLANT
DERMABOND ADVANCED .7 DNX12 (GAUZE/BANDAGES/DRESSINGS) ×1 IMPLANT
DRAPE C-ARM XRAY 36X54 (DRAPES) ×1 IMPLANT
ELECT REM PT RETURN 9FT ADLT (ELECTROSURGICAL) ×1
ELECTRODE REM PT RTRN 9FT ADLT (ELECTROSURGICAL) ×1 IMPLANT
GLOVE BIO SURGEON STRL SZ 6.5 (GLOVE) ×1 IMPLANT
GLOVE BIOGEL PI IND STRL 6.5 (GLOVE) ×1 IMPLANT
GOWN STRL REUS W/ TWL LRG LVL3 (GOWN DISPOSABLE) ×2 IMPLANT
GOWN STRL REUS W/TWL LRG LVL3 (GOWN DISPOSABLE) ×2
IV NS 500ML (IV SOLUTION) ×1
IV NS 500ML BAXH (IV SOLUTION) ×1 IMPLANT
KIT PORT INFUSION SMART 8FR (Port) ×1 IMPLANT
KIT TURNOVER KIT A (KITS) ×1 IMPLANT
MANIFOLD NEPTUNE II (INSTRUMENTS) ×1 IMPLANT
NDL FILTER BLUNT 18X1 1/2 (NEEDLE) ×1 IMPLANT
NEEDLE FILTER BLUNT 18X1 1/2 (NEEDLE) ×1 IMPLANT
PACK PORT-A-CATH (MISCELLANEOUS) ×1 IMPLANT
SUT MNCRL AB 4-0 PS2 18 (SUTURE) ×1 IMPLANT
SUT PROLENE 2 0 FS (SUTURE) ×1 IMPLANT
SUT VIC AB 2-0 SH 27 (SUTURE) ×1
SUT VIC AB 2-0 SH 27XBRD (SUTURE) ×1 IMPLANT
SUT VIC AB 3-0 SH 27 (SUTURE) ×1
SUT VIC AB 3-0 SH 27X BRD (SUTURE) ×1 IMPLANT
SYR 10ML LL (SYRINGE) ×2 IMPLANT
SYR 3ML LL SCALE MARK (SYRINGE) ×1 IMPLANT
TAG SUTURE CLAMP YLW 5PR (MISCELLANEOUS) ×1
TRAP FLUID SMOKE EVACUATOR (MISCELLANEOUS) ×1 IMPLANT

## 2023-08-26 NOTE — Discharge Instructions (Addendum)
Diet: Resume home heart healthy regular diet.   Activity: No heavy lifting >20 pounds (children, pets, laundry, garbage) or strenuous activity until follow-up, but light activity and walking are encouraged. Do not drive or drink alcohol if taking narcotic pain medications.  Wound care: May shower with soapy water and pat dry (do not rub incisions), but no baths or submerging incision underwater until follow-up. (no swimming)   Medications: Resume all home medications. For mild to moderate pain: acetaminophen (Tylenol) or ibuprofen (if no kidney disease). Combining Tylenol with alcohol can substantially increase your risk of causing liver disease. Narcotic pain medications, if prescribed, can be used for severe pain, though may cause nausea, constipation, and drowsiness. Do not combine Tylenol and Norco within a 6 hour period as Norco contains Tylenol. If you do not need the narcotic pain medication, you do not need to fill the prescription.  Call office [phone]) at any time if any questions, worsening pain, fevers/chills, bleeding, drainage from incision site, or other concerns. AMBULATORY SURGERY  DISCHARGE INSTRUCTIONS   The drugs that you were given will stay in your system until tomorrow so for the next 24 hours you should not:  Drive an automobile Make any legal decisions Drink any alcoholic beverage   You may resume regular meals tomorrow.  Today it is better to start with liquids and gradually work up to solid foods.  You may eat anything you prefer, but it is better to start with liquids, then soup and crackers, and gradually work up to solid foods.   Please notify your doctor immediately if you have any unusual bleeding, trouble breathing, redness and pain at the surgery site, drainage, fever, or pain not relieved by medication.    Your post-operative visit with Dr.                                       is: Date:                        Time:    Please call to  schedule your post-operative visit.  Additional Instructions:

## 2023-08-26 NOTE — Op Note (Signed)
SURGICAL PROCEDURE REPORT  DATE OF PROCEDURE: 08/26/2023   SURGEON: Dr. Hazle Quant   ANESTHESIA: Local with light IV sedation   PRE-OPERATIVE DIAGNOSIS: Metastatic colon cancer requiring durable central venous access for chemotherapy   POST-OPERATIVE DIAGNOSIS: Same  PROCEDURE(S): (cpt: 36561) 1.) Percutaneous access of Right internal jugular vein under ultrasound guidance 2.) Insertion of tunneled Right internal jugular central venous catheter with subcutaneous port  INTRAOPERATIVE FINDINGS: Patent easily compressible Right internal jugular vein with appropriate respiratory variations and well-secured tunneled central venous catheter with subcutaneous port at completion of the procedure  ESTIMATED BLOOD LOSS: Minimal (<20 mL)   SPECIMENS: None   IMPLANTS: 46F tunneled Bard PowerPort central venous catheter with subcutaneous port  DRAINS: None   COMPLICATIONS: None apparent   CONDITION AT COMPLETION: Hemodynamically stable, awake   DISPOSITION: PACU   INDICATION(S) FOR PROCEDURE:  Patient is a 62 y.o. female who presented with metastatic colon cancer requiring durable central venous access for chemotherapy. All risks, benefits, and alternatives to above elective procedures were discussed with the patient, who elected to proceed, and informed consent was accordingly obtained at that time.  DETAILS OF PROCEDURE:  Patient was brought to the operative suite and appropriately identified. In Trendelenburg position, Right IJ venous access site was prepped and draped in the usual sterile fashion, and following a brief timeout, percutaneous Right IJ venous access was obtained under ultrasound guidance using Seldinger technique, by which local anesthetic was injected over the Right IJ vein, and access needle was inserted under direct ultrasound visualization into the Right IJ vein, through which soft guidewire was advanced, over which access needle was withdrawn. Guidewire was secured,  attention was directed to injection of local anesthetic along the planned tunnel site, 2-3 cm transverse Right chest incision was made and confirmed to accommodate the subcutaneous port, and flushed catheter was tunneled retrograde from the port site over the Right chest to the Right IJ access site with the attached port well-secured to the catheter and within the subcutaneous pocket. Insertion sheath was advanced over the guidewire, which was withdrawn along with the insertion sheath dilator. The catheter was introduced through the sheath and left on the [place] junction under fluoro guidance and catheter cut to desire lenght. Catheter connected to port and fixed to the pocket on two side to avoid twisting. Port was confirmed to withdraw blood and flush easily, after which concentrated heparin was instilled into the port and catheter. Dermis at the subcutaneous pocket was re-approximated using buried interrupted 3-0 Vicryl suture, and 4-0 Monocryl suture was used to re-approximate skin at the insertion and subcutaneous port sites in running subcuticular fashion for the subcutaneous port and buried interrupted fashion for the insertion site. Skin was cleaned, dried, and sterile skin glue was applied. Patient was then safely transferred to PACU for a chest x-ray. Ultrasound images are available on paper chart and Fluoroscopy guidance images are available in Epic.

## 2023-08-26 NOTE — Transfer of Care (Signed)
Immediate Anesthesia Transfer of Care Note  Patient: IDONA STACH  Procedure(s) Performed: INSERTION PORT-A-CATH (Chest)  Patient Location: PACU  Anesthesia Type:MAC  Level of Consciousness: awake  Airway & Oxygen Therapy: Patient Spontanous Breathing  Post-op Assessment: Report given to RN and Post -op Vital signs reviewed and stable  Post vital signs: Reviewed and stable  Last Vitals:  Vitals Value Taken Time  BP 109/69 08/26/23 1350  Temp    Pulse    Resp 20 08/26/23 1352  SpO2    Vitals shown include unfiled device data.  Last Pain:  Vitals:   08/26/23 1149  TempSrc: Temporal  PainSc: 2          Complications: No notable events documented.

## 2023-08-26 NOTE — Anesthesia Postprocedure Evaluation (Signed)
Anesthesia Post Note  Patient: Alexis Garcia  Procedure(s) Performed: INSERTION PORT-A-CATH (Chest)  Patient location during evaluation: PACU Anesthesia Type: General Level of consciousness: awake and alert, oriented and patient cooperative Pain management: pain level controlled Vital Signs Assessment: post-procedure vital signs reviewed and stable Respiratory status: spontaneous breathing, nonlabored ventilation and respiratory function stable Cardiovascular status: blood pressure returned to baseline and stable Postop Assessment: adequate PO intake Anesthetic complications: no   No notable events documented.   Last Vitals:  Vitals:   08/26/23 1400 08/26/23 1415  BP: 113/75 113/75  Pulse:  83  Resp: 19 20  Temp:  (!) 36.4 C  SpO2:  100%    Last Pain:  Vitals:   08/26/23 1415  TempSrc:   PainSc: 6                  Reed Breech

## 2023-08-26 NOTE — Anesthesia Preprocedure Evaluation (Addendum)
Anesthesia Evaluation  Patient identified by MRN, date of birth, ID band Patient awake    Reviewed: Allergy & Precautions, NPO status , Patient's Chart, lab work & pertinent test results  History of Anesthesia Complications Negative for: history of anesthetic complications  Airway Mallampati: IV   Neck ROM: Full    Dental  (+) Edentulous Upper   Pulmonary asthma , COPD, former smoker (quit 11/2022) Stage IV lung CA   Pulmonary exam normal breath sounds clear to auscultation       Cardiovascular hypertension, Normal cardiovascular exam Rhythm:Regular Rate:Normal  ECG 08/04/23: normal   Neuro/Psych  PSYCHIATRIC DISORDERS Anxiety Depression    negative neurological ROS     GI/Hepatic Colon CA   Endo/Other  diabetes, Type 2Hypothyroidism    Renal/GU negative Renal ROS     Musculoskeletal   Abdominal   Peds  Hematology  (+) Blood dyscrasia, anemia   Anesthesia Other Findings   Reproductive/Obstetrics                             Anesthesia Physical Anesthesia Plan  ASA: 3  Anesthesia Plan: General   Post-op Pain Management:    Induction: Intravenous  PONV Risk Score and Plan: 3 and Propofol infusion, TIVA, Treatment may vary due to age or medical condition and Ondansetron  Airway Management Planned: Natural Airway  Additional Equipment:   Intra-op Plan:   Post-operative Plan:   Informed Consent: I have reviewed the patients History and Physical, chart, labs and discussed the procedure including the risks, benefits and alternatives for the proposed anesthesia with the patient or authorized representative who has indicated his/her understanding and acceptance.       Plan Discussed with: CRNA  Anesthesia Plan Comments: (LMA/GETA backup discussed.  Patient consented for risks of anesthesia including but not limited to:  - adverse reactions to medications - damage to eyes,  teeth, lips or other oral mucosa - nerve damage due to positioning  - sore throat or hoarseness - damage to heart, brain, nerves, lungs, other parts of body or loss of life  Informed patient about role of CRNA in peri- and intra-operative care.  Patient voiced understanding.)        Anesthesia Quick Evaluation

## 2023-08-26 NOTE — Anesthesia Procedure Notes (Signed)
Procedure Name: MAC Date/Time: 08/26/2023 1:07 PM  Performed by: Elisabeth Pigeon, CRNAPre-anesthesia Checklist: Patient identified, Emergency Drugs available, Suction available, Patient being monitored and Timeout performed Patient Re-evaluated:Patient Re-evaluated prior to induction Oxygen Delivery Method: Nasal cannula

## 2023-08-26 NOTE — Interval H&P Note (Signed)
History and Physical Interval Note:  08/26/2023 12:40 PM  Alexis Garcia  has presented today for surgery, with the diagnosis of malignant neoplasm of ascending colon C18.2.  The various methods of treatment have been discussed with the patient and family. After consideration of risks, benefits and other options for treatment, the patient has consented to  Procedure(s): INSERTION PORT-A-CATH (N/A) as a surgical intervention.  The patient's history has been reviewed, patient examined, no change in status, stable for surgery.  I have reviewed the patient's chart and labs.  Questions were answered to the patient's satisfaction.     Carolan Shiver

## 2023-08-27 ENCOUNTER — Encounter: Payer: Self-pay | Admitting: Oncology

## 2023-08-27 ENCOUNTER — Encounter: Payer: Self-pay | Admitting: General Surgery

## 2023-08-28 ENCOUNTER — Encounter: Payer: Self-pay | Admitting: Oncology

## 2023-08-31 ENCOUNTER — Inpatient Hospital Stay (HOSPITAL_BASED_OUTPATIENT_CLINIC_OR_DEPARTMENT_OTHER): Payer: BC Managed Care – PPO | Admitting: Oncology

## 2023-08-31 ENCOUNTER — Inpatient Hospital Stay: Payer: BC Managed Care – PPO

## 2023-08-31 ENCOUNTER — Encounter: Payer: Self-pay | Admitting: Oncology

## 2023-08-31 VITALS — BP 118/72 | HR 80 | Temp 98.3°F | Wt 150.0 lb

## 2023-08-31 VITALS — BP 115/68 | HR 108 | Temp 97.0°F | Resp 17

## 2023-08-31 DIAGNOSIS — D649 Anemia, unspecified: Secondary | ICD-10-CM | POA: Diagnosis not present

## 2023-08-31 DIAGNOSIS — C182 Malignant neoplasm of ascending colon: Secondary | ICD-10-CM

## 2023-08-31 DIAGNOSIS — Z5111 Encounter for antineoplastic chemotherapy: Secondary | ICD-10-CM | POA: Diagnosis not present

## 2023-08-31 DIAGNOSIS — E876 Hypokalemia: Secondary | ICD-10-CM

## 2023-08-31 LAB — TOTAL PROTEIN, URINE DIPSTICK: Protein, ur: NEGATIVE mg/dL

## 2023-08-31 LAB — CBC WITH DIFFERENTIAL (CANCER CENTER ONLY)
Abs Immature Granulocytes: 0.05 10*3/uL (ref 0.00–0.07)
Basophils Absolute: 0 10*3/uL (ref 0.0–0.1)
Basophils Relative: 0 %
Eosinophils Absolute: 0.1 10*3/uL (ref 0.0–0.5)
Eosinophils Relative: 1 %
HCT: 21.2 % — ABNORMAL LOW (ref 36.0–46.0)
Hemoglobin: 6.8 g/dL — CL (ref 12.0–15.0)
Immature Granulocytes: 1 %
Lymphocytes Relative: 8 %
Lymphs Abs: 0.6 10*3/uL — ABNORMAL LOW (ref 0.7–4.0)
MCH: 25.2 pg — ABNORMAL LOW (ref 26.0–34.0)
MCHC: 32.1 g/dL (ref 30.0–36.0)
MCV: 78.5 fL — ABNORMAL LOW (ref 80.0–100.0)
Monocytes Absolute: 0.5 10*3/uL (ref 0.1–1.0)
Monocytes Relative: 6 %
Neutro Abs: 6.4 10*3/uL (ref 1.7–7.7)
Neutrophils Relative %: 84 %
Platelet Count: 597 10*3/uL — ABNORMAL HIGH (ref 150–400)
RBC: 2.7 MIL/uL — ABNORMAL LOW (ref 3.87–5.11)
RDW: 16.9 % — ABNORMAL HIGH (ref 11.5–15.5)
WBC Count: 7.6 10*3/uL (ref 4.0–10.5)
nRBC: 0.3 % — ABNORMAL HIGH (ref 0.0–0.2)

## 2023-08-31 LAB — CMP (CANCER CENTER ONLY)
ALT: 21 U/L (ref 0–44)
AST: 55 U/L — ABNORMAL HIGH (ref 15–41)
Albumin: 2.1 g/dL — ABNORMAL LOW (ref 3.5–5.0)
Alkaline Phosphatase: 122 U/L (ref 38–126)
Anion gap: 9 (ref 5–15)
BUN: 23 mg/dL — ABNORMAL HIGH (ref 6–20)
CO2: 24 mmol/L (ref 22–32)
Calcium: 7.7 mg/dL — ABNORMAL LOW (ref 8.9–10.3)
Chloride: 101 mmol/L (ref 98–111)
Creatinine: 0.7 mg/dL (ref 0.44–1.00)
GFR, Estimated: >60 mL/min (ref >60)
Glucose, Bld: 113 mg/dL — ABNORMAL HIGH (ref 70–99)
Potassium: 2.8 mmol/L — ABNORMAL LOW (ref 3.5–5.1)
Sodium: 134 mmol/L — ABNORMAL LOW (ref 135–145)
Total Bilirubin: 0.9 mg/dL (ref 0.3–1.2)
Total Protein: 6.4 g/dL — ABNORMAL LOW (ref 6.5–8.1)

## 2023-08-31 MED ORDER — LEUCOVORIN CALCIUM INJECTION 350 MG
400.0000 mg/m2 | Freq: Once | INTRAVENOUS | Status: AC
  Administered 2023-08-31: 716 mg via INTRAVENOUS
  Filled 2023-08-31: qty 35.8

## 2023-08-31 MED ORDER — OXALIPLATIN CHEMO INJECTION 100 MG/20ML
85.0000 mg/m2 | Freq: Once | INTRAVENOUS | Status: AC
  Administered 2023-08-31: 150 mg via INTRAVENOUS
  Filled 2023-08-31: qty 25.44

## 2023-08-31 MED ORDER — FLUOROURACIL CHEMO INJECTION 2.5 GM/50ML
400.0000 mg/m2 | Freq: Once | INTRAVENOUS | Status: AC
  Administered 2023-08-31: 700 mg via INTRAVENOUS
  Filled 2023-08-31: qty 14

## 2023-08-31 MED ORDER — SODIUM CHLORIDE 0.9 % IV SOLN
5.0000 mg/kg | Freq: Once | INTRAVENOUS | Status: AC
  Administered 2023-08-31: 350 mg via INTRAVENOUS
  Filled 2023-08-31: qty 14

## 2023-08-31 MED ORDER — DEXTROSE 5 % IV SOLN
Freq: Once | INTRAVENOUS | Status: DC
  Filled 2023-08-31: qty 250

## 2023-08-31 MED ORDER — PALONOSETRON HCL INJECTION 0.25 MG/5ML
0.2500 mg | Freq: Once | INTRAVENOUS | Status: AC
  Administered 2023-08-31: 0.25 mg via INTRAVENOUS
  Filled 2023-08-31: qty 5

## 2023-08-31 MED ORDER — POTASSIUM CHLORIDE 20 MEQ/100ML IV SOLN
20.0000 meq | INTRAVENOUS | Status: AC
  Administered 2023-08-31 (×2): 20 meq via INTRAVENOUS

## 2023-08-31 MED ORDER — SODIUM CHLORIDE 0.9 % IV SOLN
2400.0000 mg/m2 | INTRAVENOUS | Status: DC
  Administered 2023-08-31: 4300 mg via INTRAVENOUS
  Filled 2023-08-31: qty 86

## 2023-08-31 MED ORDER — SODIUM CHLORIDE 0.9 % IV SOLN: 40.0000 meq | Freq: Once | INTRAVENOUS | Status: DC

## 2023-08-31 MED ORDER — DEXTROSE 5 % IV SOLN
Freq: Once | INTRAVENOUS | Status: AC
  Filled 2023-08-31: qty 250

## 2023-08-31 MED ORDER — DEXAMETHASONE SODIUM PHOSPHATE 10 MG/ML IJ SOLN
10.0000 mg | Freq: Once | INTRAMUSCULAR | Status: AC
  Administered 2023-08-31: 10 mg via INTRAVENOUS
  Filled 2023-08-31: qty 1

## 2023-08-31 MED ORDER — SODIUM CHLORIDE 0.9 % IV SOLN
INTRAVENOUS | Status: DC
  Filled 2023-08-31: qty 250

## 2023-08-31 NOTE — Progress Notes (Unsigned)
Patient's ankles and feet are swollen which started last week. Also she is feeling really tired and weak. She had an x-ray on 08/26/2023.

## 2023-08-31 NOTE — Progress Notes (Unsigned)
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  INTERVAL HISTORY: Patient returns to clinic today for hospital follow-up, discussion of her pathology results, and treatment planning.  She continues to have a poor appetite and weakness and fatigue, but otherwise feels well.  She denies any pain.  She denies any fevers.  She has no neurologic complaints.  She has no chest pain, shortness of breath, cough, or hemoptysis.  She denies any nausea, vomiting, constipation, or diarrhea.  She has no melena or hematochezia.  She has no urinary complaints.  Patient offers no further specific complaints today.  REVIEW OF SYSTEMS:   Review of Systems  Constitutional:  Positive for malaise/fatigue. Negative for fever and weight loss.  Respiratory: Negative.  Negative for cough, hemoptysis and shortness of breath.   Cardiovascular: Negative.  Negative for chest pain and leg swelling.  Gastrointestinal: Negative.  Negative for abdominal pain, blood in stool, constipation, diarrhea, melena, nausea and vomiting.  Genitourinary: Negative.  Negative for dysuria.  Musculoskeletal: Negative.  Negative for back pain.  Neurological:  Positive for weakness. Negative for dizziness, focal weakness and headaches.  Psychiatric/Behavioral: Negative.  The patient is not nervous/anxious.     As per HPI. Otherwise, a complete review of systems is negative.  PAST MEDICAL HISTORY: Past Medical History:  Diagnosis Date  . Abnormal uterine bleeding   . Anemia   . Anxiety   . Asthma   . Cardiomegaly   . Cardiovascular disease   . COPD (chronic obstructive pulmonary disease) (HCC)   . Depression   . Diabetes  mellitus without complication (HCC)   . Dyspnea   . Goiter, nontoxic, multinodular   . Heart murmur   . Hypertension   . Hypothyroidism   . Lung cancer, primary, with metastasis from lung to other site [facility])   . Malignant neoplasm of ascending colon (HCC)   . Primary adenocarcinoma of ascending colon (HCC)   . Syncopal episodes   . Thyrotoxicosis   . Uterine leiomyoma     PAST SURGICAL HISTORY: Past Surgical History:  Procedure Laterality Date  . BIOPSY  08/12/2023   Procedure: BIOPSY;  Surgeon: Toledo, Boykin Nearing, MD;  Location: [facility] ENDOSCOPY;  Service: Gastroenterology;;  . CESAREAN SECTION    . COLONOSCOPY N/A 08/12/2023   Procedure: COLONOSCOPY;  Surgeon: Toledo, Boykin Nearing, MD;  Location: [facility] ENDOSCOPY;  Service: Gastroenterology;  Laterality: N/A;  3rd patient  . COLONOSCOPY WITH PROPOFOL N/A 10/29/2015   Procedure: COLONOSCOPY WITH PROPOFOL;  Surgeon: Elnita Maxwell, MD;  Location: [facility] ENDOSCOPY;  Service: Endoscopy;  Laterality: N/A;  . DILITATION & CURRETTAGE/HYSTROSCOPY WITH NOVASURE ABLATION N/A 06/25/2018   Procedure: DILATATION & CURETTAGE/HYSTEROSCOPY WITH MINERVA ABLATION;  Surgeon: Hildred Laser, MD;  Location: [facility] ORS;  Service: Gynecology;  Laterality: N/A;  . PORTACATH PLACEMENT N/A 08/26/2023   Procedure: INSERTION PORT-A-CATH;  Surgeon: Carolan Shiver, MD;  Location: [facility] ORS;  Service: General;  Laterality: N/A;  . SUBMUCOSAL TATTOO INJECTION  08/12/2023   Procedure: SUBMUCOSAL TATTOO INJECTION;  Surgeon: Toledo, Boykin Nearing, MD;  Location: [facility] ENDOSCOPY;  Service: Gastroenterology;;  . TUBAL LIGATION      FAMILY HISTORY: Family History  Adopted: Yes  Problem Relation Age of Onset  .  Breast cancer Neg Hx     ADVANCED DIRECTIVES (Y/N):  N  HEALTH MAINTENANCE: Social History   Tobacco Use  . Smoking status: Former    Current packs/day: 0.00    Types: Cigarettes    Quit date: 2024    Years since quitting: 0.8  . Smokeless tobacco:  Never  Vaping Use  . Vaping status: Former  Substance Use Topics  . Alcohol use: Never  . Drug use: Never     Colonoscopy:  PAP:  Bone density:  Lipid panel:  No Known Allergies  Current Outpatient Medications  Medication Sig Dispense Refill  . albuterol (PROVENTIL HFA;VENTOLIN HFA) 108 (90 Base) MCG/ACT inhaler Inhale 2 puffs into the lungs every 6 (six) hours as needed (for exercise induced asthma).    Marland Kitchen atorvastatin (LIPITOR) 10 MG tablet SMARTSIG:1 Tablet(s) By Mouth Every Evening    . ezetimibe (ZETIA) 10 MG tablet Take 10 mg by mouth daily.    Marland Kitchen levothyroxine (SYNTHROID) 75 MCG tablet Take 75 mcg by mouth daily.    Marland Kitchen lidocaine-prilocaine (EMLA) cream Apply to affected area once 30 g 3  . metFORMIN (GLUCOPHAGE) 500 MG tablet Take 500 mg by mouth 2 (two) times daily.     . metoprolol tartrate (LOPRESSOR) 25 MG tablet Take 25 mg by mouth 2 (two) times daily.    . Multiple Vitamins-Minerals (ALIVE ONCE DAILY WOMENS PO) Take 2 tablets by mouth daily. Gummies    . ondansetron (ZOFRAN) 8 MG tablet Take 1 tablet (8 mg total) by mouth every 8 (eight) hours as needed for nausea or vomiting. Start on the third day after chemotherapy. 60 tablet 1  . prochlorperazine (COMPAZINE) 10 MG tablet Take 1 tablet (10 mg total) by mouth every 6 (six) hours as needed for nausea or vomiting. 60 tablet 1   No current facility-administered medications for this visit.    OBJECTIVE: There were no vitals filed for this visit.    There is no height or weight on file to calculate BMI.    ECOG FS:1 - Symptomatic but completely ambulatory  General: Well-developed, well-nourished, no acute distress. Eyes: Pink conjunctiva, anicteric sclera. HEENT: Normocephalic, moist mucous membranes. Lungs: No audible wheezing or coughing. Heart: Regular rate and rhythm. Abdomen: Soft, nontender, no obvious distention. Musculoskeletal: No edema, cyanosis, or clubbing. Neuro: Alert, answering all questions  appropriately. Cranial nerves grossly intact. Skin: No rashes or petechiae noted. Psych: Normal affect. Lymphatics: No cervical, calvicular, axillary or inguinal LAD.   LAB RESULTS:  Lab Results  Component Value Date   NA 134 (L) 08/31/2023   K 2.8 (L) 08/31/2023   CL 101 08/31/2023   CO2 24 08/31/2023   GLUCOSE 113 (H) 08/31/2023   BUN 23 (H) 08/31/2023   CREATININE 0.70 08/31/2023   CALCIUM 7.7 (L) 08/31/2023   PROT 6.4 (L) 08/31/2023   ALBUMIN 2.1 (L) 08/31/2023   AST 55 (H) 08/31/2023   ALT 21 08/31/2023   ALKPHOS 122 08/31/2023   BILITOT 0.9 08/31/2023   GFRNONAA >60 08/31/2023   GFRAA >60 06/09/2018    Lab Results  Component Value Date   WBC 10.0 08/25/2023   NEUTROABS 3.3 08/17/2023   HGB 8.3 (L) 08/25/2023   HCT 26.5 (L) 08/25/2023   MCV 82.0 08/25/2023   PLT 706 (H) 08/25/2023     STUDIES: DG Chest Port 1 View  Result Date: 08/26/2023 CLINICAL DATA:  Port-A-Cath in place EXAM: PORTABLE CHEST 1 VIEW COMPARISON:  None Available. FINDINGS: Right IJ approach Port-A-Cath with  the tip projecting at the superior cavoatrial junction. No visible pneumothorax. Low lung volumes. Left basilar opacities. No visible pleural effusions. Cardiomediastinal silhouette is accentuated by technique. No acute bony abnormality. IMPRESSION: 1. Right IJ approach Port-A-Cath with the tip projecting at the superior cavoatrial junction. No visible pneumothorax. 2. Low lung volumes with left basilar opacities, likely atelectasis. Electronically Signed   By: Feliberto Harts M.D.   On: 08/26/2023 16:30   DG C-Arm 1-60 Min-No Report  Result Date: 08/26/2023 Fluoroscopy was utilized by the requesting physician.  No radiographic interpretation.   US BIOPSY (LIVER)  Result Date: 08/06/2023 INDICATION: Cecal mass with multiple liver lesions. The patient presents for biopsy of a liver mass. EXAM: ULTRASOUND GUIDED CORE BIOPSY OF RIGHT LOBE LIVER MASS MEDICATIONS: None. ANESTHESIA/SEDATION:  Moderate (conscious) sedation was employed during this procedure. A total of Versed 1.0 mg and Fentanyl 75 mcg was administered intravenously by radiology nursing. Moderate Sedation Time: 10 minutes. The patient's level of consciousness and vital signs were monitored continuously by radiology nursing throughout the procedure under my direct supervision. PROCEDURE: The procedure, risks, benefits, and alternatives were explained to the patient. Questions regarding the procedure were encouraged and answered. The patient understands and consents to the procedure. A time-out was performed prior to initiating the procedure. Ultrasound was initially performed of the liver to localize liver lesions. A mass within the right lobe was chosen for biopsy. The abdominal wall was prepped with chlorhexidine in a sterile fashion, and a sterile drape was applied covering the operative field. A sterile gown and sterile gloves were used for the procedure. Local anesthesia was provided with 1% Lidocaine. A 17 gauge trocar needle was advanced into the liver at the level of a focal mass lesion. After confirming needle tip position, 3 separate coaxial 18 gauge core biopsy samples were obtained. Core biopsy samples were submitted in formalin. Gel-Foam pledgets were advanced through the outer needle as the needle was retracted and removed. Additional ultrasound was performed. COMPLICATIONS: None immediate. FINDINGS: Multiple mass lesions are seen throughout the liver parenchyma. A lesion within the inferior right lobe measuring approximately 5.5 x 3.9 x 5.0 cm was chosen for sampling. Solid tissue samples were obtained. IMPRESSION: Ultrasound-guided core biopsy performed of a lesion within the right lobe of the liver measuring up to 5.5 cm in maximum diameter. Electronically Signed   By: Irish Lack M.D.   On: 08/06/2023 13:55   CT ABDOMEN PELVIS W CONTRAST  Result Date: 08/05/2023 CLINICAL DATA:  Bilateral pulmonary nodules and  ill-defined areas of low density in the liver concerning for metastatic disease on a recent chest CTA. EXAM: CT ABDOMEN AND PELVIS WITH CONTRAST TECHNIQUE: Multidetector CT imaging of the abdomen and pelvis was performed using the standard protocol following bolus administration of intravenous contrast. RADIATION DOSE REDUCTION: This exam was performed according to the departmental dose-optimization program which includes automated exposure control, adjustment of the mA and/or kV according to patient size and/or use of iterative reconstruction technique. CONTRAST:  OMNIPAQUE IOHEXOL 300 MG/ML  SOLN COMPARISON:  Chest CTA dated 08/04/2023. FINDINGS: Lower chest: Previously described lung nodules.  Normal-sized heart. Hepatobiliary: Multiple rounded and oval, heterogeneous, predominantly low-density masses throughout the liver. An index mass in the caudate lobe measures 6.6 x 4.0 cm on image number 22/2. Normal-appearing gallbladder. Pancreas: Unremarkable. No pancreatic ductal dilatation or surrounding inflammatory changes. Spleen: Normal in size without focal abnormality. Adrenals/Urinary Tract: Normal. Adrenal glands. Small bilateral simple appearing renal cysts. These do not need imaging  follow-up. Unremarkable urinary bladder and visualized portions of the ureters. No hydronephrosis or urinary tract calculi. Stomach/Bowel: Large, lobulated, cecal and inferior right colon mass. This measures 6.6 x 5.0 cm on image number 56/2 and is causing high-grade luminal stenosis. This extends into the ileocecal valve and distal ileum with marked dilatation of the distal ileum with fecalized material in the lumen. There is some oral contrast which has passed through the lumen. The stomach and proximal small bowel are unremarkable. The appendix is diffusely enlarged. Vascular/Lymphatic: Multiple enlarged mesenteric and retroperitoneal nodes. An index left anterior para-aortic node has a short axis diameter of 15 mm on  image number 47/2. Atheromatous celiac artery calcifications without aneurysm. Reproductive: Enlarged uterus containing multiple masses, 1 of which is densely calcified. The ovaries are not identified separate from these masses in there is some lobulated inferior pelvic fluid posteriorly, primarily on the left. Other: 3 ventral hernias containing hernia fat. 2.5 x 1.5 cm intramuscular lipoma anterior to the right hip. Musculoskeletal: Bilateral sacroiliac degenerative changes. Lumbar and lower thoracic spine degenerative changes with lower thoracic spine changes of DISH. No evidence of bony metastatic disease. IMPRESSION: 1. Large cecal and inferior right colon mass extending into the ileocecal valve and distal ileum with high-grade luminal stenosis and partial distal small bowel obstruction. This is also extending into the appendix and is compatible with a primary colon carcinoma. 2. Multiple hepatic metastases. 3. Multiple metastatic mesenteric and retroperitoneal nodes. 4. Multiple pulmonary metastases. 5. Enlarged uterus containing multiple masses, 1 of which is densely calcified. The ovaries are not identified separate from these masses and there is some loculated inferior pelvic fluid posteriorly, primarily on the left. These findings are nonspecific and could be due to uterine fibroids and possible primary or metastatic ovarian neoplasms. 6. 3 ventral hernias containing herniated fat. Electronically Signed   By: Beckie Salts M.D.   On: 08/05/2023 13:44   CT Angio Chest PE W and/or Wo Contrast  Result Date: 08/04/2023 CLINICAL DATA:  Syncope.  Hypotension. EXAM: CT ANGIOGRAPHY CHEST WITH CONTRAST TECHNIQUE: Multidetector CT imaging of the chest was performed using the standard protocol during bolus administration of intravenous contrast. Multiplanar CT image reconstructions and MIPs were obtained to evaluate the vascular anatomy. RADIATION DOSE REDUCTION: This exam was performed according to the  departmental dose-optimization program which includes automated exposure control, adjustment of the mA and/or kV according to patient size and/or use of iterative reconstruction technique. CONTRAST:  75mL OMNIPAQUE IOHEXOL 350 MG/ML SOLN COMPARISON:  None Available. FINDINGS: Cardiovascular: Satisfactory opacification of the pulmonary arteries to the segmental level. No evidence of pulmonary embolism. Normal heart size. No pericardial effusion. Mediastinum/Nodes: No enlarged mediastinal, hilar, or axillary lymph nodes. Thyroid gland, trachea, and esophagus demonstrate no significant findings. Lungs/Pleura: No pneumothorax or pleural effusion is noted. Multiple pulmonary nodules are noted bilaterally, predominantly on the left side. The largest nodule measures 1.4 cm in left lower lobe. 10 mm irregular nodule is noted in left upper lobe. 4 mm nodule is noted in right upper lobe. These are concerning for metastatic disease. Upper Abdomen: Multiple ill-defined large low densities are noted in the hepatic parenchyma highly concerning for metastatic disease. Musculoskeletal: No chest wall abnormality. No acute or significant osseous findings. Review of the MIP images confirms the above findings. IMPRESSION: Bilateral pulmonary nodules are noted, predominantly on the left, concerning for metastatic disease. Also noted are multiple ill-defined large low densities in the hepatic parenchyma concerning for metastatic disease is well. PET scan is recommended  for further evaluation. No definite evidence of pulmonary embolus. Electronically Signed   By: Lupita Raider M.D.   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  Liver biopsy completed on August 06, 2023 confirmed diagnosis.  Initial  CEA is 1440.  Patient also underwent colonoscopy that appeared to show a complete obstruction, but patient reports she is passing stool and gas.  She also denies pain.  Patient will require chemotherapy using FOLFOX plus Avastin.  Plan to initiate treatment in approximately 2 weeks.  She will require port placement.  Return to clinic on August 31, 2023 to initiate cycle 1. Uterine masses: Patient reports she has a history of benign fibroids.  Will order CA125 today and patient will see gynecology oncology on Wednesday for completeness. Anemia: Patient's hemoglobin is trended down to 7.3.  She may benefit from blood transfusion in the near future. Thrombocytosis: Likely reactive, monitor. Hyperbilirubinemia: Resolved.  Patient expressed understanding and was in agreement with this plan. She also understands that She can call clinic at any time with any questions, concerns, or complaints.    Cancer Staging  Primary adenocarcinoma of ascending colon [facility]) Staging form: Colon and Rectum, AJCC 8th Edition - Clinical stage from 08/17/2023: Stage IVB (cTX, cNX, pM1b) - Signed by Jeralyn Ruths, MD on 08/17/2023 Stage prefix: Initial diagnosis   Jeralyn Ruths, MD   08/31/2023 8:57 AM

## 2023-08-31 NOTE — Patient Instructions (Addendum)
[place] [facility]  Discharge Instructions: Thank you for choosing [facility] to provide your oncology and hematology care.  If you have a lab appointment with the Cancer Center, please go directly to the Cancer Center and check in at the registration area.  Wear comfortable clothing and clothing appropriate for easy access to any Portacath or PICC line.   We strive to give you quality time with your provider. You may need to reschedule your appointment if you arrive late (15 or more minutes).  Arriving late affects you and other patients whose appointments are after yours.  Also, if you miss three or more appointments without notifying the office, you may be dismissed from the clinic at the provider's discretion.      For prescription refill requests, have your pharmacy contact our office and allow 72 hours for refills to be completed.    Today you received the following chemotherapy and/or immunotherapy agents: Mvasi, Oxaliplatin, Leucovorin and Adrucil    To help prevent nausea and vomiting after your treatment, we encourage you to take your nausea medication as directed.  BELOW ARE SYMPTOMS THAT SHOULD BE REPORTED IMMEDIATELY: *FEVER GREATER THAN 100.4 F (38 C) OR HIGHER *CHILLS OR SWEATING *NAUSEA AND VOMITING THAT IS NOT CONTROLLED WITH YOUR NAUSEA MEDICATION *UNUSUAL SHORTNESS OF BREATH *UNUSUAL BRUISING OR BLEEDING *URINARY PROBLEMS (pain or burning when urinating, or frequent urination) *BOWEL PROBLEMS (unusual diarrhea, constipation, pain near the anus) TENDERNESS IN MOUTH AND THROAT WITH OR WITHOUT PRESENCE OF ULCERS (sore throat, sores in mouth, or a toothache) UNUSUAL RASH, SWELLING OR PAIN  UNUSUAL VAGINAL DISCHARGE OR ITCHING   Items with * indicate a potential emergency and should be followed up as soon as possible or go to the Emergency Department if any problems should occur.  Please show the CHEMOTHERAPY ALERT CARD or  IMMUNOTHERAPY ALERT CARD at check-in to the Emergency Department and triage nurse.  Should you have questions after your visit or need to cancel or reschedule your appointment, please contact [place] [facility]  [phone] and follow the prompts.  Office hours are 8:00 a.m. to 4:30 p.m. Monday - Friday. Please note that voicemails left after 4:00 p.m. may not be returned until the following business day.  We are closed weekends and major holidays. You have access to a nurse at all times for urgent questions. Please call the main number to the clinic [phone] and follow the prompts.  For any non-urgent questions, you may also contact your provider using MyChart. We now offer e-Visits for anyone 3 and older to request care online for non-urgent symptoms. For details visit [email or web address].   Also download the MyChart app! Go to the app store, search "MyChart", open the app, select [facility], and log in with your MyChart username and password.    Hypokalemia Hypokalemia means that the amount of potassium in the blood is lower than normal. Potassium is a mineral (electrolyte) that helps regulate the amount of fluid in the body. It also stimulates muscle tightening (contraction) and helps nerves work properly. Normally, most of the body's potassium is inside cells, and only a very small amount is in the blood. Because the amount in the blood is so small, minor changes to potassium levels in the blood can be life-threatening. What are the causes? This condition may be caused by: Antibiotic medicine. Diarrhea or vomiting. Taking too much of a medicine that helps you have a bowel movement (laxative) can  cause diarrhea and lead to hypokalemia. Chronic kidney disease (CKD). Medicines that help the body get rid of excess fluid (diuretics). Eating disorders, such as anorexia or bulimia. Low magnesium levels in the body. Sweating a lot. What are the signs or  symptoms? Symptoms of this condition include: Weakness. Constipation. Fatigue. Muscle cramps. Mental confusion. Skipped heartbeats or irregular heartbeat (palpitations). Tingling or numbness. How is this diagnosed? This condition is diagnosed with a blood test. How is this treated? This condition may be treated by: Taking potassium supplements. Adjusting the medicines that you take. Eating more foods that contain a lot of potassium. If your potassium level is very low, you may need to get potassium through an IV and be monitored in the hospital. Follow these instructions at home: Eating and drinking  Eat a healthy diet. A healthy diet includes fresh fruits and vegetables, whole grains, healthy fats, and lean proteins. If told, eat more foods that contain a lot of potassium. These include: Nuts, such as peanuts and pistachios. Seeds, such as sunflower seeds and pumpkin seeds. Peas, lentils, and lima beans. Whole grain and bran cereals and breads. Fresh fruits and vegetables, such as apricots, avocado, bananas, cantaloupe, kiwi, oranges, tomatoes, asparagus, and potatoes. Juices, such as orange, tomato, and prune. Lean meats, including fish. Milk and milk products, such as yogurt. General instructions Take over-the-counter and prescription medicines only as told by your health care provider. This includes vitamins, natural food products, and supplements. Keep all follow-up visits. This is important. Contact a health care provider if: You have weakness that gets worse. You feel your heart pounding or racing. You vomit. You have diarrhea. You have diabetes and you have trouble keeping your blood sugar in your target range. Get help right away if: You have chest pain. You have shortness of breath. You have vomiting or diarrhea that lasts for more than 2 days. You faint. These symptoms may be an emergency. Get help right away. Call 911. Do not wait to see if the symptoms will  go away. Do not drive yourself to the hospital. Summary Hypokalemia means that the amount of potassium in the blood is lower than normal. This condition is diagnosed with a blood test. Hypokalemia may be treated by taking potassium supplements, adjusting the medicines that you take, or eating more foods that are high in potassium. If your potassium level is very low, you may need to get potassium through an IV and be monitored in the hospital. This information is not intended to replace advice given to you by your health care provider. Make sure you discuss any questions you have with your health care provider. Document Revised: 07/11/2021 Document Reviewed: 07/11/2021 Elsevier Patient Education  2024 Elsevier Inc.    Bevacizumab Injection What is this medication? BEVACIZUMAB (be va SIZ yoo mab) treats some types of cancer. It works by blocking a protein that causes cancer cells to grow and multiply. This helps to slow or stop the spread of cancer cells. It is a monoclonal antibody. This medicine may be used for other purposes; ask your health care provider or pharmacist if you have questions. COMMON BRAND NAME(S): Alymsys, Avastin, MVASI, Omer Jack What should I tell my care team before I take this medication? They need to know if you have any of these conditions: Blood clots Coughing up blood Having or recent surgery Heart failure High blood pressure History of a connection between 2 or more body parts that do not usually connect (fistula) History of a tear in  your stomach or intestines Protein in your urine An unusual or allergic reaction to bevacizumab, other medications, foods, dyes, or preservatives Pregnant or trying to get pregnant Breast-feeding How should I use this medication? This medication is injected into a vein. It is given by your care team in a hospital or clinic setting. Talk to your care team the use of this medication in children. Special care may be  needed. Overdosage: If you think you have taken too much of this medicine contact a poison control center or emergency room at once. NOTE: This medicine is only for you. Do not share this medicine with others. What if I miss a dose? Keep appointments for follow-up doses. It is important not to miss your dose. Call your care team if you are unable to keep an appointment. What may interact with this medication? Interactions are not expected. This list may not describe all possible interactions. Give your health care provider a list of all the medicines, herbs, non-prescription drugs, or dietary supplements you use. Also tell them if you smoke, drink alcohol, or use illegal drugs. Some items may interact with your medicine. What should I watch for while using this medication? Your condition will be monitored carefully while you are receiving this medication. You may need blood work while taking this medication. This medication may make you feel generally unwell. This is not uncommon as chemotherapy can affect healthy cells as well as cancer cells. Report any side effects. Continue your course of treatment even though you feel ill unless your care team tells you to stop. This medication may increase your risk to bruise or bleed. Call your care team if you notice any unusual bleeding. Before having surgery, talk to your care team to make sure it is ok. This medication can increase the risk of poor healing of your surgical site or wound. You will need to stop this medication for 28 days before surgery. After surgery, wait at least 28 days before restarting this medication. Make sure the surgical site or wound is healed enough before restarting this medication. Talk to your care team if questions. Talk to your care team if you may be pregnant. Serious birth defects can occur if you take this medication during pregnancy and for 6 months after the last dose. Contraception is recommended while taking this  medication and for 6 months after the last dose. Your care team can help you find the option that works for you. Do not breastfeed while taking this medication and for 6 months after the last dose. This medication can cause infertility. Talk to your care team if you are concerned about your fertility. What side effects may I notice from receiving this medication? Side effects that you should report to your care team as soon as possible: Allergic reactions--skin rash, itching, hives, swelling of the face, lips, tongue, or throat Bleeding--bloody or black, tar-like stools, vomiting blood or brown material that looks like coffee grounds, red or dark brown urine, small red or purple spots on skin, unusual bruising or bleeding Blood clot--pain, swelling, or warmth in the leg, shortness of breath, chest pain Heart attack--pain or tightness in the chest, shoulders, arms, or jaw, nausea, shortness of breath, cold or clammy skin, feeling faint or lightheaded Heart failure--shortness of breath, swelling of the ankles, feet, or hands, sudden weight gain, unusual weakness or fatigue Increase in blood pressure Infection--fever, chills, cough, sore throat, wounds that don't heal, pain or trouble when passing urine, general feeling of discomfort or  being unwell Infusion reactions--chest pain, shortness of breath or trouble breathing, feeling faint or lightheaded Kidney injury--decrease in the amount of urine, swelling of the ankles, hands, or feet Stomach pain that is severe, does not go away, or gets worse Stroke--sudden numbness or weakness of the face, arm, or leg, trouble speaking, confusion, trouble walking, loss of balance or coordination, dizziness, severe headache, change in vision Sudden and severe headache, confusion, change in vision, seizures, which may be signs of posterior reversible encephalopathy syndrome (PRES) Side effects that usually do not require medical attention (report to your care team if  they continue or are bothersome): Back pain Change in taste Diarrhea Dry skin Increased tears Nosebleed This list may not describe all possible side effects. Call your doctor for medical advice about side effects. You may report side effects to FDA at 1-800-FDA-1088. Where should I keep my medication? This medication is given in a hospital or clinic. It will not be stored at home. NOTE: This sheet is a summary. It may not cover all possible information. If you have questions about this medicine, talk to your doctor, pharmacist, or health care provider.  2024 Elsevier/Gold Standard (2022-03-14 00:00:00)    Oxaliplatin Injection What is this medication? OXALIPLATIN (ox AL i PLA tin) treats colorectal cancer. It works by slowing down the growth of cancer cells. This medicine may be used for other purposes; ask your health care provider or pharmacist if you have questions. COMMON BRAND NAME(S): Eloxatin What should I tell my care team before I take this medication? They need to know if you have any of these conditions: Heart disease History of irregular heartbeat or rhythm Liver disease Low blood cell levels (white cells, red cells, and platelets) Lung or breathing disease, such as asthma Take medications that treat or prevent blood clots Tingling of the fingers, toes, or other nerve disorder An unusual or allergic reaction to oxaliplatin, other medications, foods, dyes, or preservatives If you or your partner are pregnant or trying to get pregnant Breast-feeding How should I use this medication? This medication is injected into a vein. It is given by your care team in a hospital or clinic setting. Talk to your care team about the use of this medication in children. Special care may be needed. Overdosage: If you think you have taken too much of this medicine contact a poison control center or emergency room at once. NOTE: This medicine is only for you. Do not share this medicine with  others. What if I miss a dose? Keep appointments for follow-up doses. It is important not to miss a dose. Call your care team if you are unable to keep an appointment. What may interact with this medication? Do not take this medication with any of the following: Cisapride Dronedarone Pimozide Thioridazine This medication may also interact with the following: Aspirin and aspirin-like medications Certain medications that treat or prevent blood clots, such as warfarin, apixaban, dabigatran, and rivaroxaban Cisplatin Cyclosporine Diuretics Medications for infection, such as acyclovir, adefovir, amphotericin B, bacitracin, cidofovir, foscarnet, ganciclovir, gentamicin, pentamidine, vancomycin NSAIDs, medications for pain and inflammation, such as ibuprofen or naproxen Other medications that cause heart rhythm changes Pamidronate Zoledronic acid This list may not describe all possible interactions. Give your health care provider a list of all the medicines, herbs, non-prescription drugs, or dietary supplements you use. Also tell them if you smoke, drink alcohol, or use illegal drugs. Some items may interact with your medicine. What should I watch for while using this medication?  Your condition will be monitored carefully while you are receiving this medication. You may need blood work while taking this medication. This medication may make you feel generally unwell. This is not uncommon as chemotherapy can affect healthy cells as well as cancer cells. Report any side effects. Continue your course of treatment even though you feel ill unless your care team tells you to stop. This medication may increase your risk of getting an infection. Call your care team for advice if you get a fever, chills, sore throat, or other symptoms of a cold or flu. Do not treat yourself. Try to avoid being around people who are sick. Avoid taking medications that contain aspirin, acetaminophen, ibuprofen, naproxen, or  ketoprofen unless instructed by your care team. These medications may hide a fever. Be careful brushing or flossing your teeth or using a toothpick because you may get an infection or bleed more easily. If you have any dental work done, tell your dentist you are receiving this medication. This medication can make you more sensitive to cold. Do not drink cold drinks or use ice. Cover exposed skin before coming in contact with cold temperatures or cold objects. When out in cold weather wear warm clothing and cover your mouth and nose to warm the air that goes into your lungs. Tell your care team if you get sensitive to the cold. Talk to your care team if you or your partner are pregnant or think either of you might be pregnant. This medication can cause serious birth defects if taken during pregnancy and for 9 months after the last dose. A negative pregnancy test is required before starting this medication. A reliable form of contraception is recommended while taking this medication and for 9 months after the last dose. Talk to your care team about effective forms of contraception. Do not father a child while taking this medication and for 6 months after the last dose. Use a condom while having sex during this time period. Do not breastfeed while taking this medication and for 3 months after the last dose. This medication may cause infertility. Talk to your care team if you are concerned about your fertility. What side effects may I notice from receiving this medication? Side effects that you should report to your care team as soon as possible: Allergic reactions--skin rash, itching, hives, swelling of the face, lips, tongue, or throat Bleeding--bloody or black, tar-like stools, vomiting blood or brown material that looks like coffee grounds, red or dark brown urine, small red or purple spots on skin, unusual bruising or bleeding Dry cough, shortness of breath or trouble breathing Heart rhythm changes--fast  or irregular heartbeat, dizziness, feeling faint or lightheaded, chest pain, trouble breathing Infection--fever, chills, cough, sore throat, wounds that don't heal, pain or trouble when passing urine, general feeling of discomfort or being unwell Liver injury--right upper belly pain, loss of appetite, nausea, light-colored stool, dark yellow or brown urine, yellowing skin or eyes, unusual weakness or fatigue Low red blood cell level--unusual weakness or fatigue, dizziness, headache, trouble breathing Muscle injury--unusual weakness or fatigue, muscle pain, dark yellow or brown urine, decrease in amount of urine Pain, tingling, or numbness in the hands or feet Sudden and severe headache, confusion, change in vision, seizures, which may be signs of posterior reversible encephalopathy syndrome (PRES) Unusual bruising or bleeding Side effects that usually do not require medical attention (report to your care team if they continue or are bothersome): Diarrhea Nausea Pain, redness, or swelling with sores inside  the mouth or throat Unusual weakness or fatigue Vomiting This list may not describe all possible side effects. Call your doctor for medical advice about side effects. You may report side effects to FDA at 1-800-FDA-1088. Where should I keep my medication? This medication is given in a hospital or clinic. It will not be stored at home. NOTE: This sheet is a summary. It may not cover all possible information. If you have questions about this medicine, talk to your doctor, pharmacist, or health care provider.  2024 Elsevier/Gold Standard (2022-08-12 00:00:00)    Leucovorin Injection What is this medication? LEUCOVORIN (loo koe VOR in) prevents side effects from certain medications, such as methotrexate. It works by increasing folate levels. This helps protect healthy cells in your body. It may also be used to treat anemia caused by low levels of folate. It can also be used with fluorouracil, a  type of chemotherapy, to treat colorectal cancer. It works by increasing the effects of fluorouracil in the body. This medicine may be used for other purposes; ask your health care provider or pharmacist if you have questions. What should I tell my care team before I take this medication? They need to know if you have any of these conditions: Anemia from low levels of vitamin B12 in the blood An unusual or allergic reaction to leucovorin, folic acid, other medications, foods, dyes, or preservatives Pregnant or trying to get pregnant Breastfeeding How should I use this medication? This medication is injected into a vein or a muscle. It is given by your care team in a hospital or clinic setting. Talk to your care team about the use of this medication in children. Special care may be needed. Overdosage: If you think you have taken too much of this medicine contact a poison control center or emergency room at once. NOTE: This medicine is only for you. Do not share this medicine with others. What if I miss a dose? Keep appointments for follow-up doses. It is important not to miss your dose. Call your care team if you are unable to keep an appointment. What may interact with this medication? Capecitabine Fluorouracil Phenobarbital Phenytoin Primidone Trimethoprim;sulfamethoxazole This list may not describe all possible interactions. Give your health care provider a list of all the medicines, herbs, non-prescription drugs, or dietary supplements you use. Also tell them if you smoke, drink alcohol, or use illegal drugs. Some items may interact with your medicine. What should I watch for while using this medication? Your condition will be monitored carefully while you are receiving this medication. This medication may increase the side effects of 5-fluorouracil. Tell your care team if you have diarrhea or mouth sores that do not get better or that get worse. What side effects may I notice from  receiving this medication? Side effects that you should report to your care team as soon as possible: Allergic reactions--skin rash, itching, hives, swelling of the face, lips, tongue, or throat This list may not describe all possible side effects. Call your doctor for medical advice about side effects. You may report side effects to FDA at 1-800-FDA-1088. Where should I keep my medication? This medication is given in a hospital or clinic. It will not be stored at home. NOTE: This sheet is a summary. It may not cover all possible information. If you have questions about this medicine, talk to your doctor, pharmacist, or health care provider.  2024 Elsevier/Gold Standard (2022-04-01 00:00:00)   Fluorouracil Injection What is this medication? FLUOROURACIL (flure oh  YOOR a sil) treats some types of cancer. It works by slowing down the growth of cancer cells. This medicine may be used for other purposes; ask your health care provider or pharmacist if you have questions. COMMON BRAND NAME(S): Adrucil What should I tell my care team before I take this medication? They need to know if you have any of these conditions: Blood disorders Dihydropyrimidine dehydrogenase (DPD) deficiency Infection, such as chickenpox, cold sores, herpes Kidney disease Liver disease Poor nutrition Recent or ongoing radiation therapy An unusual or allergic reaction to fluorouracil, other medications, foods, dyes, or preservatives If you or your partner are pregnant or trying to get pregnant Breast-feeding How should I use this medication? This medication is injected into a vein. It is administered by your care team in a hospital or clinic setting. Talk to your care team about the use of this medication in children. Special care may be needed. Overdosage: If you think you have taken too much of this medicine contact a poison control center or emergency room at once. NOTE: This medicine is only for you. Do not share  this medicine with others. What if I miss a dose? Keep appointments for follow-up doses. It is important not to miss your dose. Call your care team if you are unable to keep an appointment. What may interact with this medication? Do not take this medication with any of the following: Live virus vaccines This medication may also interact with the following: Medications that treat or prevent blood clots, such as warfarin, enoxaparin, dalteparin This list may not describe all possible interactions. Give your health care provider a list of all the medicines, herbs, non-prescription drugs, or dietary supplements you use. Also tell them if you smoke, drink alcohol, or use illegal drugs. Some items may interact with your medicine. What should I watch for while using this medication? Your condition will be monitored carefully while you are receiving this medication. This medication may make you feel generally unwell. This is not uncommon as chemotherapy can affect healthy cells as well as cancer cells. Report any side effects. Continue your course of treatment even though you feel ill unless your care team tells you to stop. In some cases, you may be given additional medications to help with side effects. Follow all directions for their use. This medication may increase your risk of getting an infection. Call your care team for advice if you get a fever, chills, sore throat, or other symptoms of a cold or flu. Do not treat yourself. Try to avoid being around people who are sick. This medication may increase your risk to bruise or bleed. Call your care team if you notice any unusual bleeding. Be careful brushing or flossing your teeth or using a toothpick because you may get an infection or bleed more easily. If you have any dental work done, tell your dentist you are receiving this medication. Avoid taking medications that contain aspirin, acetaminophen, ibuprofen, naproxen, or ketoprofen unless instructed  by your care team. These medications may hide a fever. Do not treat diarrhea with over the counter products. Contact your care team if you have diarrhea that lasts more than 2 days or if it is severe and watery. This medication can make you more sensitive to the sun. Keep out of the sun. If you cannot avoid being in the sun, wear protective clothing and sunscreen. Do not use sun lamps, tanning beds, or tanning booths. Talk to your care team if you or your partner  wish to become pregnant or think you might be pregnant. This medication can cause serious birth defects if taken during pregnancy and for 3 months after the last dose. A reliable form of contraception is recommended while taking this medication and for 3 months after the last dose. Talk to your care team about effective forms of contraception. Do not father a child while taking this medication and for 3 months after the last dose. Use a condom while having sex during this time period. Do not breastfeed while taking this medication. This medication may cause infertility. Talk to your care team if you are concerned about your fertility. What side effects may I notice from receiving this medication? Side effects that you should report to your care team as soon as possible: Allergic reactions--skin rash, itching, hives, swelling of the face, lips, tongue, or throat Heart attack--pain or tightness in the chest, shoulders, arms, or jaw, nausea, shortness of breath, cold or clammy skin, feeling faint or lightheaded Heart failure--shortness of breath, swelling of the ankles, feet, or hands, sudden weight gain, unusual weakness or fatigue Heart rhythm changes--fast or irregular heartbeat, dizziness, feeling faint or lightheaded, chest pain, trouble breathing High ammonia level--unusual weakness or fatigue, confusion, loss of appetite, nausea, vomiting, seizures Infection--fever, chills, cough, sore throat, wounds that don't heal, pain or trouble when  passing urine, general feeling of discomfort or being unwell Low red blood cell level--unusual weakness or fatigue, dizziness, headache, trouble breathing Pain, tingling, or numbness in the hands or feet, muscle weakness, change in vision, confusion or trouble speaking, loss of balance or coordination, trouble walking, seizures Redness, swelling, and blistering of the skin over hands and feet Severe or prolonged diarrhea Unusual bruising or bleeding Side effects that usually do not require medical attention (report to your care team if they continue or are bothersome): Dry skin Headache Increased tears Nausea Pain, redness, or swelling with sores inside the mouth or throat Sensitivity to light Vomiting This list may not describe all possible side effects. Call your doctor for medical advice about side effects. You may report side effects to FDA at 1-800-FDA-1088. Where should I keep my medication? This medication is given in a hospital or clinic. It will not be stored at home. NOTE: This sheet is a summary. It may not cover all possible information. If you have questions about this medicine, talk to your doctor, pharmacist, or health care provider.  2024 Elsevier/Gold Standard (2022-03-04 00:00:00)

## 2023-08-31 NOTE — Progress Notes (Addendum)
Proceed w/ Bevacizumab today per Dr Orlie Dakin - aware pts port placed 08/26/23 & urine protein not available.  Ebony Hail, Pharm.D., CPP 08/31/2023@9 :29 AM

## 2023-09-01 ENCOUNTER — Encounter: Payer: Self-pay | Admitting: Oncology

## 2023-09-01 LAB — CEA: CEA: 1780 ng/mL — ABNORMAL HIGH (ref 0.0–4.7)

## 2023-09-02 ENCOUNTER — Inpatient Hospital Stay: Payer: BC Managed Care – PPO

## 2023-09-02 VITALS — BP 118/76 | HR 99 | Resp 18

## 2023-09-02 DIAGNOSIS — C182 Malignant neoplasm of ascending colon: Secondary | ICD-10-CM

## 2023-09-02 DIAGNOSIS — Z5111 Encounter for antineoplastic chemotherapy: Secondary | ICD-10-CM | POA: Diagnosis not present

## 2023-09-02 LAB — PREPARE RBC (CROSSMATCH)

## 2023-09-02 MED ORDER — HEPARIN SOD (PORK) LOCK FLUSH 100 UNIT/ML IV SOLN
500.0000 [IU] | Freq: Once | INTRAVENOUS | Status: AC | PRN
Start: 2023-09-02 — End: 2023-09-02
  Administered 2023-09-02: 500 [IU]
  Filled 2023-09-02: qty 5

## 2023-09-02 MED ORDER — SODIUM CHLORIDE 0.9% FLUSH
10.0000 mL | INTRAVENOUS | Status: DC | PRN
  Administered 2023-09-02: 10 mL
  Filled 2023-09-02: qty 10

## 2023-09-03 ENCOUNTER — Inpatient Hospital Stay: Payer: BC Managed Care – PPO

## 2023-09-03 DIAGNOSIS — Z5111 Encounter for antineoplastic chemotherapy: Secondary | ICD-10-CM | POA: Diagnosis not present

## 2023-09-03 DIAGNOSIS — C182 Malignant neoplasm of ascending colon: Secondary | ICD-10-CM

## 2023-09-03 MED ORDER — HEPARIN SOD (PORK) LOCK FLUSH 100 UNIT/ML IV SOLN
500.0000 [IU] | Freq: Every day | INTRAVENOUS | Status: DC | PRN
Start: 2023-09-03 — End: 2023-09-03
  Filled 2023-09-03: qty 5

## 2023-09-03 MED ORDER — ACETAMINOPHEN 325 MG PO TABS
650.0000 mg | ORAL_TABLET | Freq: Once | ORAL | Status: AC
  Administered 2023-09-03: 650 mg via ORAL
  Filled 2023-09-03: qty 2

## 2023-09-03 MED ORDER — DIPHENHYDRAMINE HCL 50 MG/ML IJ SOLN
25.0000 mg | Freq: Once | INTRAMUSCULAR | Status: AC
  Administered 2023-09-03: 25 mg via INTRAVENOUS
  Filled 2023-09-03: qty 1

## 2023-09-03 MED ORDER — SODIUM CHLORIDE 0.9 % IV SOLN
INTRAVENOUS | Status: DC
  Filled 2023-09-03 (×2): qty 250

## 2023-09-03 NOTE — Patient Instructions (Signed)

## 2023-09-04 LAB — TYPE AND SCREEN
ABO/RH(D): B POS
Antibody Screen: NEGATIVE
Unit division: 0
Unit division: 0
Unit division: 0

## 2023-09-04 LAB — BPAM RBC
Blood Product Expiration Date: 202411172359
Blood Product Expiration Date: 202411182359
Blood Product Expiration Date: 202411192359
ISSUE DATE / TIME: 202410241159
ISSUE DATE / TIME: 202410241351
Unit Type and Rh: 1700
Unit Type and Rh: 1700
Unit Type and Rh: 7300

## 2023-09-07 ENCOUNTER — Encounter: Payer: Self-pay | Admitting: Oncology

## 2023-09-07 ENCOUNTER — Inpatient Hospital Stay (HOSPITAL_BASED_OUTPATIENT_CLINIC_OR_DEPARTMENT_OTHER): Payer: BC Managed Care – PPO | Admitting: Oncology

## 2023-09-07 ENCOUNTER — Inpatient Hospital Stay: Payer: BC Managed Care – PPO

## 2023-09-07 VITALS — BP 129/80 | HR 82 | Temp 97.7°F | Resp 16 | Ht 66.0 in | Wt 155.0 lb

## 2023-09-07 DIAGNOSIS — C182 Malignant neoplasm of ascending colon: Secondary | ICD-10-CM

## 2023-09-07 DIAGNOSIS — D649 Anemia, unspecified: Secondary | ICD-10-CM

## 2023-09-07 DIAGNOSIS — Z5111 Encounter for antineoplastic chemotherapy: Secondary | ICD-10-CM | POA: Diagnosis not present

## 2023-09-07 LAB — CBC WITH DIFFERENTIAL/PLATELET
Abs Immature Granulocytes: 0.04 10*3/uL (ref 0.00–0.07)
Basophils Absolute: 0 10*3/uL (ref 0.0–0.1)
Basophils Relative: 1 %
Eosinophils Absolute: 0.1 10*3/uL (ref 0.0–0.5)
Eosinophils Relative: 2 %
HCT: 31.3 % — ABNORMAL LOW (ref 36.0–46.0)
Hemoglobin: 10 g/dL — ABNORMAL LOW (ref 12.0–15.0)
Immature Granulocytes: 1 %
Lymphocytes Relative: 11 %
Lymphs Abs: 0.7 10*3/uL (ref 0.7–4.0)
MCH: 26.1 pg (ref 26.0–34.0)
MCHC: 31.9 g/dL (ref 30.0–36.0)
MCV: 81.7 fL (ref 80.0–100.0)
Monocytes Absolute: 0.2 10*3/uL (ref 0.1–1.0)
Monocytes Relative: 4 %
Neutro Abs: 4.9 10*3/uL (ref 1.7–7.7)
Neutrophils Relative %: 81 %
Platelets: 403 10*3/uL — ABNORMAL HIGH (ref 150–400)
RBC: 3.83 MIL/uL — ABNORMAL LOW (ref 3.87–5.11)
RDW: 16.3 % — ABNORMAL HIGH (ref 11.5–15.5)
WBC: 5.9 10*3/uL (ref 4.0–10.5)
nRBC: 0 % (ref 0.0–0.2)

## 2023-09-07 LAB — SAMPLE TO BLOOD BANK

## 2023-09-07 LAB — CMP (CANCER CENTER ONLY)
ALT: 14 U/L (ref 0–44)
AST: 39 U/L (ref 15–41)
Albumin: 2.4 g/dL — ABNORMAL LOW (ref 3.5–5.0)
Alkaline Phosphatase: 97 U/L (ref 38–126)
Anion gap: 11 (ref 5–15)
BUN: 14 mg/dL (ref 6–20)
CO2: 27 mmol/L (ref 22–32)
Calcium: 8 mg/dL — ABNORMAL LOW (ref 8.9–10.3)
Chloride: 98 mmol/L (ref 98–111)
Creatinine: 0.68 mg/dL (ref 0.44–1.00)
GFR, Estimated: >60 mL/min (ref >60)
Glucose, Bld: 117 mg/dL — ABNORMAL HIGH (ref 70–99)
Potassium: 3.3 mmol/L — ABNORMAL LOW (ref 3.5–5.1)
Sodium: 136 mmol/L (ref 135–145)
Total Bilirubin: 0.8 mg/dL (ref 0.3–1.2)
Total Protein: 6.7 g/dL (ref 6.5–8.1)

## 2023-09-07 NOTE — Progress Notes (Signed)
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  INTERVAL HISTORY: Patient returns to clinic today for further evaluation, assessment of her toleration of cycle 1 of FOLFOX plus Avastin, and consideration of additional blood.  She continues to have weakness and fatigue, but this is improved.  She also complains of mild insomnia, but states last night she slept better.  She otherwise feels well.  She tolerated her first treatment without significant side effects.  She denies any pain.  She denies any fevers.  She has no neurologic complaints.  She has no chest pain, shortness of breath, cough, or hemoptysis.  She denies any nausea, vomiting, constipation, or diarrhea.  She has no melena or hematochezia.  She has no urinary complaints.  Patient offers no further specific complaints today.  REVIEW OF SYSTEMS:   Review of Systems  Constitutional:  Positive for malaise/fatigue. Negative for fever and weight loss.  Respiratory: Negative.  Negative for cough, hemoptysis and shortness of breath.   Cardiovascular: Negative.  Negative for chest pain and leg swelling.  Gastrointestinal: Negative.  Negative for abdominal pain, blood in stool, constipation, diarrhea, melena, nausea and vomiting.  Genitourinary: Negative.  Negative for dysuria.  Musculoskeletal: Negative.  Negative for back pain.  Neurological:  Positive for weakness. Negative for dizziness, focal weakness and headaches.  Psychiatric/Behavioral: Negative.  The patient is not nervous/anxious.     As per HPI. Otherwise, a complete review of systems is negative.  PAST MEDICAL  HISTORY: Past Medical History:  Diagnosis Date   Abnormal uterine bleeding    Anemia    Anxiety    Asthma    Cardiomegaly    Cardiovascular disease    COPD (chronic obstructive pulmonary disease) (HCC)    Depression    Diabetes mellitus without complication (HCC)    Dyspnea    Goiter, nontoxic, multinodular    Heart murmur    Hypertension    Hypothyroidism    Lung cancer, primary, with metastasis from lung to other site [facility])    Malignant neoplasm of ascending colon (HCC)    Primary adenocarcinoma of ascending colon (HCC)    Syncopal episodes    Thyrotoxicosis    Uterine leiomyoma     PAST SURGICAL HISTORY: Past Surgical History:  Procedure Laterality Date   BIOPSY  08/12/2023   Procedure: BIOPSY;  Surgeon: Norma Fredrickson, Boykin Nearing, MD;  Location: [facility] ENDOSCOPY;  Service: Gastroenterology;;   CESAREAN SECTION     COLONOSCOPY N/A 08/12/2023   Procedure: COLONOSCOPY;  Surgeon: Toledo, Boykin Nearing, MD;  Location: [facility] ENDOSCOPY;  Service: Gastroenterology;  Laterality: N/A;  3rd patient   COLONOSCOPY WITH PROPOFOL N/A 10/29/2015   Procedure: COLONOSCOPY WITH PROPOFOL;  Surgeon: Elnita Maxwell, MD;  Location: [facility] ENDOSCOPY;  Service: Endoscopy;  Laterality: N/A;   DILITATION & CURRETTAGE/HYSTROSCOPY WITH NOVASURE ABLATION N/A 06/25/2018   Procedure: DILATATION & CURETTAGE/HYSTEROSCOPY WITH MINERVA ABLATION;  Surgeon: Hildred Laser, MD;  Location: [facility] ORS;  Service: Gynecology;  Laterality: N/A;   PORTACATH PLACEMENT N/A 08/26/2023   Procedure: INSERTION PORT-A-CATH;  Surgeon: Carolan Shiver, MD;  Location: [facility] ORS;  Service: General;  Laterality: N/A;   SUBMUCOSAL TATTOO INJECTION  08/12/2023  Procedure: SUBMUCOSAL TATTOO INJECTION;  Surgeon: Toledo, Boykin Nearing, MD;  Location: [facility] ENDOSCOPY;  Service: Gastroenterology;;   TUBAL LIGATION      FAMILY HISTORY: Family History  Adopted: Yes  Problem Relation Age of Onset   Breast cancer Neg Hx     ADVANCED DIRECTIVES  (Y/N):  N  HEALTH MAINTENANCE: Social History   Tobacco Use   Smoking status: Former    Current packs/day: 0.00    Types: Cigarettes    Quit date: 2024    Years since quitting: 0.8   Smokeless tobacco: Never  Vaping Use   Vaping status: Former  Substance Use Topics   Alcohol use: Never   Drug use: Never     Colonoscopy:  PAP:  Bone density:  Lipid panel:  No Known Allergies  Current Outpatient Medications  Medication Sig Dispense Refill   albuterol (PROVENTIL HFA;VENTOLIN HFA) 108 (90 Base) MCG/ACT inhaler Inhale 2 puffs into the lungs every 6 (six) hours as needed (for exercise induced asthma).     atorvastatin (LIPITOR) 10 MG tablet SMARTSIG:1 Tablet(s) By Mouth Every Evening     ezetimibe (ZETIA) 10 MG tablet Take 10 mg by mouth daily.     levothyroxine (SYNTHROID) 75 MCG tablet Take 75 mcg by mouth daily.     lidocaine-prilocaine (EMLA) cream Apply to affected area once 30 g 3   metFORMIN (GLUCOPHAGE) 500 MG tablet Take 500 mg by mouth 2 (two) times daily.      metoprolol tartrate (LOPRESSOR) 25 MG tablet Take 25 mg by mouth 2 (two) times daily.     Multiple Vitamins-Minerals (ALIVE ONCE DAILY WOMENS PO) Take 2 tablets by mouth daily. Gummies     ondansetron (ZOFRAN) 8 MG tablet Take 1 tablet (8 mg total) by mouth every 8 (eight) hours as needed for nausea or vomiting. Start on the third day after chemotherapy. 60 tablet 1   prochlorperazine (COMPAZINE) 10 MG tablet Take 1 tablet (10 mg total) by mouth every 6 (six) hours as needed for nausea or vomiting. 60 tablet 1   No current facility-administered medications for this visit.    OBJECTIVE: Vitals:   09/07/23 0906  BP: 129/80  Pulse: 82  Resp: 16  Temp: 97.7 F (36.5 C)  SpO2: 100%      Body mass index is 25.02 kg/m.    ECOG FS:1 - Symptomatic but completely ambulatory  General: Well-developed, well-nourished, no acute distress. Eyes: Pink conjunctiva, anicteric sclera. HEENT: Normocephalic, moist mucous  membranes. Lungs: No audible wheezing or coughing. Heart: Regular rate and rhythm. Abdomen: Soft, nontender, no obvious distention. Musculoskeletal: No edema, cyanosis, or clubbing. Neuro: Alert, answering all questions appropriately. Cranial nerves grossly intact. Skin: No rashes or petechiae noted. Psych: Normal affect.  LAB RESULTS:  Lab Results  Component Value Date   NA 136 09/07/2023   K 3.3 (L) 09/07/2023   CL 98 09/07/2023   CO2 27 09/07/2023   GLUCOSE 117 (H) 09/07/2023   BUN 14 09/07/2023   CREATININE 0.68 09/07/2023   CALCIUM 8.0 (L) 09/07/2023   PROT 6.7 09/07/2023   ALBUMIN 2.4 (L) 09/07/2023   AST 39 09/07/2023   ALT 14 09/07/2023   ALKPHOS 97 09/07/2023   BILITOT 0.8 09/07/2023   GFRNONAA >60 09/07/2023   GFRAA >60 06/09/2018    Lab Results  Component Value Date   WBC 5.9 09/07/2023   NEUTROABS 4.9 09/07/2023   HGB 10.0 (L) 09/07/2023   HCT 31.3 (L) 09/07/2023   MCV 81.7 09/07/2023  PLT 403 (H) 09/07/2023     STUDIES: DG Chest Port 1 View  Result Date: 08/26/2023 CLINICAL DATA:  Port-A-Cath in place EXAM: PORTABLE CHEST 1 VIEW COMPARISON:  None Available. FINDINGS: Right IJ approach Port-A-Cath with the tip projecting at the superior cavoatrial junction. No visible pneumothorax. Low lung volumes. Left basilar opacities. No visible pleural effusions. Cardiomediastinal silhouette is accentuated by technique. No acute bony abnormality. IMPRESSION: 1. Right IJ approach Port-A-Cath with the tip projecting at the superior cavoatrial junction. No visible pneumothorax. 2. Low lung volumes with left basilar opacities, likely atelectasis. Electronically Signed   By: Feliberto Harts M.D.   On: 08/26/2023 16:30   DG C-Arm 1-60 Min-No Report  Result Date: 08/26/2023 Fluoroscopy was utilized by the requesting physician.  No radiographic interpretation.    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  PLAN:    Stage IVB  adenocarcinoma of the colon with liver and lung metastasis: CT scan results reviewed independently on September 24 and 25 confirming stage of disease.  Liver biopsy completed on August 06, 2023 confirmed diagnosis.  Patient's pretreatment CEA trended up to 1780.  Patient also underwent colonoscopy that appeared to show a complete obstruction, but patient reports she is passing stool and gas.  She also denies pain.  Patient tolerated cycle 1 of FOLFOX plus Avastin last week without significant side effects.  Return to clinic in 1 week for further evaluation and consideration of cycle 2. Uterine masses: Patient reports she has a history of benign fibroids.  Appreciate gynecology input.  Monitor.   Anemia: Patient's hemoglobin has improved to 10.0 with transfusion.  Monitor. Thrombocytosis: Chronic and.  Likely reactive. Hyperbilirubinemia: Resolved. Hypokalemia: Improved.  Continue oral potassium supplementation. Insomnia: Patient states she will call clinic if it continues to be troublesome.  Patient will likely benefit from a referral to [facility] in the near future.  Patient expressed understanding and was in agreement with this plan. She also understands that She can call clinic at any time with any questions, concerns, or complaints.    Cancer Staging  Primary adenocarcinoma of ascending colon [facility]) Staging form: Colon and Rectum, AJCC 8th Edition - Clinical stage from 08/17/2023: Stage IVB (cTX, cNX, pM1b) - Signed by Jeralyn Ruths, MD on 08/17/2023 Stage prefix: Initial diagnosis   Jeralyn Ruths, MD   09/07/2023 9:54 AM

## 2023-09-07 NOTE — Progress Notes (Signed)
Having swelling in legs and feet. It is worse in her right leg.

## 2023-09-08 ENCOUNTER — Ambulatory Visit: Payer: BC Managed Care – PPO

## 2023-09-10 ENCOUNTER — Encounter: Payer: Self-pay | Admitting: Oncology

## 2023-09-14 ENCOUNTER — Inpatient Hospital Stay: Payer: BC Managed Care – PPO | Attending: Oncology

## 2023-09-14 ENCOUNTER — Encounter: Payer: Self-pay | Admitting: Oncology

## 2023-09-14 ENCOUNTER — Inpatient Hospital Stay (HOSPITAL_BASED_OUTPATIENT_CLINIC_OR_DEPARTMENT_OTHER): Payer: BC Managed Care – PPO | Admitting: Oncology

## 2023-09-14 ENCOUNTER — Inpatient Hospital Stay: Payer: BC Managed Care – PPO

## 2023-09-14 DIAGNOSIS — C7800 Secondary malignant neoplasm of unspecified lung: Secondary | ICD-10-CM | POA: Insufficient documentation

## 2023-09-14 DIAGNOSIS — C787 Secondary malignant neoplasm of liver and intrahepatic bile duct: Secondary | ICD-10-CM | POA: Diagnosis not present

## 2023-09-14 DIAGNOSIS — Z87891 Personal history of nicotine dependence: Secondary | ICD-10-CM | POA: Insufficient documentation

## 2023-09-14 DIAGNOSIS — C182 Malignant neoplasm of ascending colon: Secondary | ICD-10-CM

## 2023-09-14 DIAGNOSIS — D75839 Thrombocytosis, unspecified: Secondary | ICD-10-CM | POA: Insufficient documentation

## 2023-09-14 DIAGNOSIS — G4700 Insomnia, unspecified: Secondary | ICD-10-CM | POA: Diagnosis not present

## 2023-09-14 DIAGNOSIS — E876 Hypokalemia: Secondary | ICD-10-CM | POA: Diagnosis not present

## 2023-09-14 DIAGNOSIS — Z5111 Encounter for antineoplastic chemotherapy: Secondary | ICD-10-CM | POA: Insufficient documentation

## 2023-09-14 DIAGNOSIS — D649 Anemia, unspecified: Secondary | ICD-10-CM | POA: Diagnosis not present

## 2023-09-14 LAB — CBC WITH DIFFERENTIAL (CANCER CENTER ONLY)
Abs Immature Granulocytes: 0.02 10*3/uL (ref 0.00–0.07)
Basophils Absolute: 0 10*3/uL (ref 0.0–0.1)
Basophils Relative: 1 %
Eosinophils Absolute: 0.2 10*3/uL (ref 0.0–0.5)
Eosinophils Relative: 3 %
HCT: 28.4 % — ABNORMAL LOW (ref 36.0–46.0)
Hemoglobin: 9 g/dL — ABNORMAL LOW (ref 12.0–15.0)
Immature Granulocytes: 0 %
Lymphocytes Relative: 18 %
Lymphs Abs: 0.9 10*3/uL (ref 0.7–4.0)
MCH: 26.4 pg (ref 26.0–34.0)
MCHC: 31.7 g/dL (ref 30.0–36.0)
MCV: 83.3 fL (ref 80.0–100.0)
Monocytes Absolute: 0.4 10*3/uL (ref 0.1–1.0)
Monocytes Relative: 9 %
Neutro Abs: 3.2 10*3/uL (ref 1.7–7.7)
Neutrophils Relative %: 69 %
Platelet Count: 442 10*3/uL — ABNORMAL HIGH (ref 150–400)
RBC: 3.41 MIL/uL — ABNORMAL LOW (ref 3.87–5.11)
RDW: 18.3 % — ABNORMAL HIGH (ref 11.5–15.5)
WBC Count: 4.7 10*3/uL (ref 4.0–10.5)
nRBC: 0 % (ref 0.0–0.2)

## 2023-09-14 LAB — CMP (CANCER CENTER ONLY)
ALT: 9 U/L (ref 0–44)
AST: 36 U/L (ref 15–41)
Albumin: 2.5 g/dL — ABNORMAL LOW (ref 3.5–5.0)
Alkaline Phosphatase: 83 U/L (ref 38–126)
Anion gap: 10 (ref 5–15)
BUN: 12 mg/dL (ref 6–20)
CO2: 28 mmol/L (ref 22–32)
Calcium: 7.8 mg/dL — ABNORMAL LOW (ref 8.9–10.3)
Chloride: 101 mmol/L (ref 98–111)
Creatinine: 0.66 mg/dL (ref 0.44–1.00)
GFR, Estimated: >60 mL/min (ref >60)
Glucose, Bld: 131 mg/dL — ABNORMAL HIGH (ref 70–99)
Potassium: 2.9 mmol/L — ABNORMAL LOW (ref 3.5–5.1)
Sodium: 139 mmol/L (ref 135–145)
Total Bilirubin: 0.6 mg/dL (ref <1.2)
Total Protein: 6.2 g/dL — ABNORMAL LOW (ref 6.5–8.1)

## 2023-09-14 MED ORDER — SODIUM CHLORIDE 0.9 % IV SOLN
Freq: Once | INTRAVENOUS | Status: AC
Start: 2023-09-14 — End: 2023-09-14
  Filled 2023-09-14: qty 250

## 2023-09-14 MED ORDER — SODIUM CHLORIDE 0.9 % IV SOLN
2400.0000 mg/m2 | INTRAVENOUS | Status: DC
  Administered 2023-09-14: 4300 mg via INTRAVENOUS
  Filled 2023-09-14: qty 86

## 2023-09-14 MED ORDER — LEUCOVORIN CALCIUM INJECTION 350 MG
400.0000 mg/m2 | Freq: Once | INTRAVENOUS | Status: AC
  Administered 2023-09-14: 716 mg via INTRAVENOUS
  Filled 2023-09-14: qty 35.8

## 2023-09-14 MED ORDER — POTASSIUM CHLORIDE 2 MEQ/ML IV SOLN: 40.0000 meq | Freq: Once | INTRAVENOUS | Status: DC

## 2023-09-14 MED ORDER — PALONOSETRON HCL INJECTION 0.25 MG/5ML
0.2500 mg | Freq: Once | INTRAVENOUS | Status: AC
  Administered 2023-09-14: 0.25 mg via INTRAVENOUS
  Filled 2023-09-14: qty 5

## 2023-09-14 MED ORDER — DEXAMETHASONE SODIUM PHOSPHATE 10 MG/ML IJ SOLN
10.0000 mg | Freq: Once | INTRAMUSCULAR | Status: AC
  Administered 2023-09-14: 10 mg via INTRAVENOUS
  Filled 2023-09-14: qty 1

## 2023-09-14 MED ORDER — DEXTROSE 5 % IV SOLN
Freq: Once | INTRAVENOUS | Status: AC
Start: 2023-09-14 — End: 2023-09-14
  Filled 2023-09-14: qty 250

## 2023-09-14 MED ORDER — FLUOROURACIL CHEMO INJECTION 2.5 GM/50ML
400.0000 mg/m2 | Freq: Once | INTRAVENOUS | Status: AC
  Administered 2023-09-14: 700 mg via INTRAVENOUS
  Filled 2023-09-14: qty 14

## 2023-09-14 MED ORDER — OXALIPLATIN CHEMO INJECTION 100 MG/20ML
85.0000 mg/m2 | Freq: Once | INTRAVENOUS | Status: AC
  Administered 2023-09-14: 150 mg via INTRAVENOUS
  Filled 2023-09-14: qty 10

## 2023-09-14 MED ORDER — SODIUM CHLORIDE 0.9 % IV SOLN
5.0000 mg/kg | Freq: Once | INTRAVENOUS | Status: AC
  Administered 2023-09-14: 350 mg via INTRAVENOUS
  Filled 2023-09-14: qty 14

## 2023-09-14 NOTE — Progress Notes (Signed)
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  INTERVAL HISTORY: Patient returns to clinic today for further evaluation and consideration of cycle 2 of FOLFOX plus Avastin.  She continues to have chronic weakness and fatigue, but otherwise feels well.  She is tolerating her treatments without significant side effects.  She denies any pain.  She denies any fevers.  She has no neurologic complaints.  She has no chest pain, shortness of breath, cough, or hemoptysis.  She denies any nausea, vomiting, constipation, or diarrhea.  She has no melena or hematochezia.  She has no urinary complaints.  Patient offers no further specific complaints today.  REVIEW OF SYSTEMS:   Review of Systems  Constitutional:  Positive for malaise/fatigue. Negative for fever and weight loss.  Respiratory: Negative.  Negative for cough, hemoptysis and shortness of breath.   Cardiovascular: Negative.  Negative for chest pain and leg swelling.  Gastrointestinal: Negative.  Negative for abdominal pain, blood in stool, constipation, diarrhea, melena, nausea and vomiting.  Genitourinary: Negative.  Negative for dysuria.  Musculoskeletal: Negative.  Negative for back pain.  Neurological:  Positive for weakness. Negative for dizziness, focal weakness and headaches.  Psychiatric/Behavioral: Negative.  The patient is not nervous/anxious.     As per HPI. Otherwise, a complete review of systems is negative.  PAST MEDICAL HISTORY: Past Medical History:  Diagnosis Date   Abnormal uterine bleeding    Anemia    Anxiety    Asthma    Cardiomegaly     Cardiovascular disease    COPD (chronic obstructive pulmonary disease) (HCC)    Depression    Diabetes mellitus without complication (HCC)    Dyspnea    Goiter, nontoxic, multinodular    Heart murmur    Hypertension    Hypothyroidism    Lung cancer, primary, with metastasis from lung to other site [facility])    Malignant neoplasm of ascending colon (HCC)    Primary adenocarcinoma of ascending colon (HCC)    Syncopal episodes    Thyrotoxicosis    Uterine leiomyoma     PAST SURGICAL HISTORY: Past Surgical History:  Procedure Laterality Date   BIOPSY  08/12/2023   Procedure: BIOPSY;  Surgeon: Norma Fredrickson, Boykin Nearing, MD;  Location: [facility] ENDOSCOPY;  Service: Gastroenterology;;   CESAREAN SECTION     COLONOSCOPY N/A 08/12/2023   Procedure: COLONOSCOPY;  Surgeon: Toledo, Boykin Nearing, MD;  Location: [facility] ENDOSCOPY;  Service: Gastroenterology;  Laterality: N/A;  3rd patient   COLONOSCOPY WITH PROPOFOL N/A 10/29/2015   Procedure: COLONOSCOPY WITH PROPOFOL;  Surgeon: Elnita Maxwell, MD;  Location: [facility] ENDOSCOPY;  Service: Endoscopy;  Laterality: N/A;   DILITATION & CURRETTAGE/HYSTROSCOPY WITH NOVASURE ABLATION N/A 06/25/2018   Procedure: DILATATION & CURETTAGE/HYSTEROSCOPY WITH MINERVA ABLATION;  Surgeon: Hildred Laser, MD;  Location: [facility] ORS;  Service: Gynecology;  Laterality: N/A;   PORTACATH PLACEMENT N/A 08/26/2023   Procedure: INSERTION PORT-A-CATH;  Surgeon: Carolan Shiver, MD;  Location: [facility] ORS;  Service: General;  Laterality: N/A;   SUBMUCOSAL TATTOO INJECTION  08/12/2023   Procedure: SUBMUCOSAL TATTOO INJECTION;  Surgeon: Toledo, Boykin Nearing, MD;  Location: [facility] ENDOSCOPY;  Service: Gastroenterology;;   TUBAL LIGATION  FAMILY HISTORY: Family History  Adopted: Yes  Problem Relation Age of Onset   Breast cancer Neg Hx     ADVANCED DIRECTIVES (Y/N):  N  HEALTH MAINTENANCE: Social History   Tobacco Use   Smoking status: Former    Current packs/day: 0.00    Types:  Cigarettes    Quit date: 2024    Years since quitting: 0.8   Smokeless tobacco: Never  Vaping Use   Vaping status: Former  Substance Use Topics   Alcohol use: Never   Drug use: Never     Colonoscopy:  PAP:  Bone density:  Lipid panel:  No Known Allergies  Current Outpatient Medications  Medication Sig Dispense Refill   albuterol (PROVENTIL HFA;VENTOLIN HFA) 108 (90 Base) MCG/ACT inhaler Inhale 2 puffs into the lungs every 6 (six) hours as needed (for exercise induced asthma).     atorvastatin (LIPITOR) 10 MG tablet SMARTSIG:1 Tablet(s) By Mouth Every Evening     ezetimibe (ZETIA) 10 MG tablet Take 10 mg by mouth daily.     levothyroxine (SYNTHROID) 75 MCG tablet Take 75 mcg by mouth daily.     lidocaine-prilocaine (EMLA) cream Apply to affected area once 30 g 3   metFORMIN (GLUCOPHAGE) 500 MG tablet Take 500 mg by mouth 2 (two) times daily.      metoprolol tartrate (LOPRESSOR) 25 MG tablet Take 25 mg by mouth 2 (two) times daily.     Multiple Vitamins-Minerals (ALIVE ONCE DAILY WOMENS PO) Take 2 tablets by mouth daily. Gummies     ondansetron (ZOFRAN) 8 MG tablet Take 1 tablet (8 mg total) by mouth every 8 (eight) hours as needed for nausea or vomiting. Start on the third day after chemotherapy. 60 tablet 1   prochlorperazine (COMPAZINE) 10 MG tablet Take 1 tablet (10 mg total) by mouth every 6 (six) hours as needed for nausea or vomiting. 60 tablet 1   No current facility-administered medications for this visit.   Facility-Administered Medications Ordered in Other Visits  Medication Dose Route Frequency Provider Last Rate Last Admin   fluorouracil (ADRUCIL) 4,300 mg in sodium chloride 0.9 % 64 mL chemo infusion  2,400 mg/m2 (Treatment Plan Recorded) Intravenous 1 day or 1 dose Orlie Dakin, Tollie Pizza, MD       fluorouracil (ADRUCIL) chemo injection 700 mg  400 mg/m2 (Treatment Plan Recorded) Intravenous Once Jeralyn Ruths, MD       leucovorin 716 mg in dextrose 5 % 250 mL  infusion  400 mg/m2 (Treatment Plan Recorded) Intravenous Once Jeralyn Ruths, MD 143 mL/hr at 09/14/23 1059 716 mg at 09/14/23 1059   oxaliplatin (ELOXATIN) 150 mg in dextrose 5 % 500 mL chemo infusion  85 mg/m2 (Treatment Plan Recorded) Intravenous Once Jeralyn Ruths, MD 265 mL/hr at 09/14/23 1100 150 mg at 09/14/23 1100    OBJECTIVE: Vitals:   09/14/23 0853  BP: 136/82  Pulse: 62  Resp: 16  Temp: (!) 97.4 F (36.3 C)  SpO2: 100%       Body mass index is 25.66 kg/m.    ECOG FS:1 - Symptomatic but completely ambulatory  General: Well-developed, well-nourished, no acute distress. Eyes: Pink conjunctiva, anicteric sclera. HEENT: Normocephalic, moist mucous membranes. Lungs: No audible wheezing or coughing. Heart: Regular rate and rhythm. Abdomen: Soft, nontender, no obvious distention. Musculoskeletal: No edema, cyanosis, or clubbing. Neuro: Alert, answering all questions appropriately. Cranial nerves grossly intact. Skin: No rashes or petechiae noted. Psych: Normal affect.  LAB RESULTS:  Lab Results  Component  Value Date   NA 139 09/14/2023   K 2.9 (L) 09/14/2023   CL 101 09/14/2023   CO2 28 09/14/2023   GLUCOSE 131 (H) 09/14/2023   BUN 12 09/14/2023   CREATININE 0.66 09/14/2023   CALCIUM 7.8 (L) 09/14/2023   PROT 6.2 (L) 09/14/2023   ALBUMIN 2.5 (L) 09/14/2023   AST 36 09/14/2023   ALT 9 09/14/2023   ALKPHOS 83 09/14/2023   BILITOT 0.6 09/14/2023   GFRNONAA >60 09/14/2023   GFRAA >60 06/09/2018    Lab Results  Component Value Date   WBC 4.7 09/14/2023   NEUTROABS 3.2 09/14/2023   HGB 9.0 (L) 09/14/2023   HCT 28.4 (L) 09/14/2023   MCV 83.3 09/14/2023   PLT 442 (H) 09/14/2023     STUDIES: DG Chest Port 1 View  Result Date: 08/26/2023 CLINICAL DATA:  Port-A-Cath in place EXAM: PORTABLE CHEST 1 VIEW COMPARISON:  None Available. FINDINGS: Right IJ approach Port-A-Cath with the tip projecting at the superior cavoatrial junction. No visible  pneumothorax. Low lung volumes. Left basilar opacities. No visible pleural effusions. Cardiomediastinal silhouette is accentuated by technique. No acute bony abnormality. IMPRESSION: 1. Right IJ approach Port-A-Cath with the tip projecting at the superior cavoatrial junction. No visible pneumothorax. 2. Low lung volumes with left basilar opacities, likely atelectasis. Electronically Signed   By: Feliberto Harts M.D.   On: 08/26/2023 16:30   DG C-Arm 1-60 Min-No Report  Result Date: 08/26/2023 Fluoroscopy was utilized by the requesting physician.  No radiographic interpretation.    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  Liver biopsy completed on August 06, 2023 confirmed diagnosis.  Patient's pretreatment CEA trended up to 1780.  Today's result is pending.  Patient also underwent colonoscopy that appeared to show a complete obstruction, but patient reports she is passing stool and gas.  She also denies pain.  Proceed with cycle 2 of FOLFOX plus Avastin today.  Return to clinic in 2 days for pump removal and potassium infusion.  Patient will then return to clinic in 2 weeks for further evaluation and consideration of cycle 3.  Plan to reimage after cycle 6. Uterine masses: Patient reports she has a history of benign fibroids.  Appreciate gynecology input.  Monitor.   Anemia: Hemoglobin has trended down to 9.0.  Monitor. Thrombocytosis: Chronic and unchanged.  Likely reactive. Hyperbilirubinemia: Resolved. Hypokalemia: Patient received 40 mEq IV potassium with her pump removal in 2 days. Insomnia: Patient states she will call clinic if it continues to be troublesome.  Patient will likely benefit from a referral to [facility] in the near future.  Patient expressed understanding and  was in agreement with this plan. She also understands that She can call clinic at any time with any questions, concerns, or complaints.    Cancer Staging  Primary adenocarcinoma of ascending colon [facility]) Staging form: Colon and Rectum, AJCC 8th Edition - Clinical stage from 08/17/2023: Stage IVB (cTX, cNX, pM1b) - Signed by Jeralyn Ruths, MD on 08/17/2023 Stage prefix: Initial diagnosis   Jeralyn Ruths, MD   09/14/2023 12:57 PM

## 2023-09-14 NOTE — Progress Notes (Signed)
Having bilateral leg edema. Is causing some soreness.

## 2023-09-14 NOTE — Patient Instructions (Addendum)
[place] [facility] AT [facility]  Discharge Instructions: Thank you for choosing [facility] to provide your oncology and hematology care.  If you have a lab appointment with the Cancer Center, please go directly to the Cancer Center and check in at the registration area.  Wear comfortable clothing and clothing appropriate for easy access to any Portacath or PICC line.   We strive to give you quality time with your provider. You may need to reschedule your appointment if you arrive late (15 or more minutes).  Arriving late affects you and other patients whose appointments are after yours.  Also, if you miss three or more appointments without notifying the office, you may be dismissed from the clinic at the provider's discretion.      For prescription refill requests, have your pharmacy contact our office and allow 72 hours for refills to be completed.    Today you received the following chemotherapy and/or immunotherapy agents Oxaliplatin, Leucovorin, Adrucil & MVASI      To help prevent nausea and vomiting after your treatment, we encourage you to take your nausea medication as directed.  BELOW ARE SYMPTOMS THAT SHOULD BE REPORTED IMMEDIATELY: *FEVER GREATER THAN 100.4 F (38 C) OR HIGHER *CHILLS OR SWEATING *NAUSEA AND VOMITING THAT IS NOT CONTROLLED WITH YOUR NAUSEA MEDICATION *UNUSUAL SHORTNESS OF BREATH *UNUSUAL BRUISING OR BLEEDING *URINARY PROBLEMS (pain or burning when urinating, or frequent urination) *BOWEL PROBLEMS (unusual diarrhea, constipation, pain near the anus) TENDERNESS IN MOUTH AND THROAT WITH OR WITHOUT PRESENCE OF ULCERS (sore throat, sores in mouth, or a toothache) UNUSUAL RASH, SWELLING OR PAIN  UNUSUAL VAGINAL DISCHARGE OR ITCHING   Items with * indicate a potential emergency and should be followed up as soon as possible or go to the Emergency Department if any problems should occur.  Please show the CHEMOTHERAPY ALERT CARD or  IMMUNOTHERAPY ALERT CARD at check-in to the Emergency Department and triage nurse.  Should you have questions after your visit or need to cancel or reschedule your appointment, please contact [place] [facility] AT [facility]  [phone] and follow the prompts.  Office hours are 8:00 a.m. to 4:30 p.m. Monday - Friday. Please note that voicemails left after 4:00 p.m. may not be returned until the following business day.  We are closed weekends and major holidays. You have access to a nurse at all times for urgent questions. Please call the main number to the clinic [phone] and follow the prompts.  For any non-urgent questions, you may also contact your provider using MyChart. We now offer e-Visits for anyone 43 and older to request care online for non-urgent symptoms. For details visit [email or web address].   Also download the MyChart app! Go to the app store, search "MyChart", open the app, select [place], and log in with your MyChart username and password.

## 2023-09-15 ENCOUNTER — Other Ambulatory Visit: Payer: Self-pay

## 2023-09-15 LAB — CEA: CEA: 1484 ng/mL — ABNORMAL HIGH (ref 0.0–4.7)

## 2023-09-16 ENCOUNTER — Encounter: Payer: Self-pay | Admitting: Oncology

## 2023-09-16 ENCOUNTER — Inpatient Hospital Stay: Payer: BC Managed Care – PPO

## 2023-09-16 VITALS — BP 154/92 | HR 66 | Temp 98.4°F | Resp 18

## 2023-09-16 DIAGNOSIS — Z5111 Encounter for antineoplastic chemotherapy: Secondary | ICD-10-CM | POA: Diagnosis not present

## 2023-09-16 DIAGNOSIS — E876 Hypokalemia: Secondary | ICD-10-CM

## 2023-09-16 DIAGNOSIS — C182 Malignant neoplasm of ascending colon: Secondary | ICD-10-CM

## 2023-09-16 MED ORDER — SODIUM CHLORIDE 0.9 % IV SOLN
40.0000 meq | Freq: Once | INTRAVENOUS | Status: DC
  Filled 2023-09-16: qty 20

## 2023-09-16 MED ORDER — SODIUM CHLORIDE 0.9% FLUSH
10.0000 mL | INTRAVENOUS | Status: DC | PRN
Start: 2023-09-16 — End: 2023-09-16
  Administered 2023-09-16: 10 mL
  Filled 2023-09-16: qty 10

## 2023-09-16 MED ORDER — HEPARIN SOD (PORK) LOCK FLUSH 100 UNIT/ML IV SOLN
500.0000 [IU] | Freq: Once | INTRAVENOUS | Status: AC | PRN
Start: 2023-09-16 — End: 2023-09-16
  Administered 2023-09-16: 500 [IU]
  Filled 2023-09-16: qty 5

## 2023-09-16 MED ORDER — POTASSIUM CHLORIDE 20 MEQ/100ML IV SOLN
20.0000 meq | Freq: Once | INTRAVENOUS | Status: AC
  Administered 2023-09-16: 20 meq via INTRAVENOUS

## 2023-09-16 NOTE — Patient Instructions (Signed)
Potassium Chloride Injection What is this medication? POTASSIUM CHLORIDE (poe TASS i um KLOOR ide) prevents and treats low levels of potassium in your body. Potassium plays an important role in maintaining the health of your kidneys, heart, muscles, and nervous system. This medicine may be used for other purposes; ask your health care provider or pharmacist if you have questions. COMMON BRAND NAME(S): PROAMP What should I tell my care team before I take this medication? They need to know if you have any of these conditions: Addison disease Dehydration Diabetes (high blood sugar) Heart disease High levels of potassium in the blood Irregular heartbeat or rhythm Kidney disease Large areas of burned skin An unusual or allergic reaction to potassium, other medications, foods, dyes, or preservatives Pregnant or trying to get pregnant Breast-feeding How should I use this medication? This medication is injected into a vein. It is given in a hospital or clinic setting. Talk to your care team about the use of this medication in children. Special care may be needed. Overdosage: If you think you have taken too much of this medicine contact a poison control center or emergency room at once. NOTE: This medicine is only for you. Do not share this medicine with others. What if I miss a dose? This does not apply. This medication is not for regular use. What may interact with this medication? Do not take this medication with any of the following: Certain diuretics, such as spironolactone, triamterene Eplerenone Sodium polystyrene sulfonate This medication may also interact with the following: Certain medications for blood pressure or heart disease, such as lisinopril, losartan, quinapril, valsartan Medications that lower your chance of fighting infection, such as cyclosporine, tacrolimus NSAIDs, medications for pain and inflammation, such as ibuprofen or naproxen Other potassium supplements Salt  substitutes This list may not describe all possible interactions. Give your health care provider a list of all the medicines, herbs, non-prescription drugs, or dietary supplements you use. Also tell them if you smoke, drink alcohol, or use illegal drugs. Some items may interact with your medicine. What should I watch for while using this medication? Visit your care team for regular checks on your progress. Tell your care team if your symptoms do not start to get better or if they get worse. You may need blood work while you are taking this medication. Avoid salt substitutes unless you are told otherwise by your care team. What side effects may I notice from receiving this medication? Side effects that you should report to your care team as soon as possible: Allergic reactions--skin rash, itching, hives, swelling of the face, lips, tongue, or throat High potassium level--muscle weakness, fast or irregular heartbeat Side effects that usually do not require medical attention (report to your care team if they continue or are bothersome): Diarrhea Nausea Stomach pain Vomiting This list may not describe all possible side effects. Call your doctor for medical advice about side effects. You may report side effects to FDA at 1-800-FDA-1088. Where should I keep my medication? This medication is given in a hospital or clinic. It will not be stored at home. NOTE: This sheet is a summary. It may not cover all possible information. If you have questions about this medicine, talk to your doctor, pharmacist, or health care provider.  2024 Elsevier/Gold Standard (2022-05-09 00:00:00)

## 2023-09-16 NOTE — Progress Notes (Signed)
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  Past medical history of anemia, CVD, COPD, depression, DM, HTN.  Patient receiving folfox and avastin.  Met with patient during IV potassium infusion and pump removal.  Reports decreased appetite for the last year due to Rybelsus.  Reports that she stopped Rybelsus on Sept 24th.  Appetite has increased some.  This am drank ensure, ate banana and hard boiled egg.  Likes yogurt, blueberries and yogurt.  Reports that texture of chicken is not good right now.  Eating more fish and [place].  Reports bowel movement 1-2 a day (no diarrhea).     Medications: compazine, zofran, MVI, metformin   Labs: K 2.9, glucose 131, calcium 7.8, albumin 2.5, hgb 9.0   Anthropometrics:   Height: 66 inches Weight: 159 lb on 11/4 UBW: 240 lb over 1 year ago BMI: 25  34% weight loss in the last year, significant (medication Rybelsus)    NUTRITION DIAGNOSIS:  Unintentional weight loss related to cancer and drug side effect as evidenced by 34% weight loss in the last year (significant) and lack of appetite while on drug.     INTERVENTION:  Continue oral nutrition supplements at least 1-2 a day. Discussed foods high in calorie and protein. Encouraged eating q 2 hours  Contact information provided   MONITORING, EVALUATION, GOAL: weight trends, intake   Next Visit: Wednesday, Dec 4 at pump removal  Veer Elamin B. Freida Busman, RD, LDN Registered Dietitian [phone]

## 2023-09-17 ENCOUNTER — Other Ambulatory Visit: Payer: Self-pay

## 2023-09-18 ENCOUNTER — Other Ambulatory Visit: Payer: Self-pay

## 2023-09-20 ENCOUNTER — Other Ambulatory Visit: Payer: Self-pay

## 2023-09-28 ENCOUNTER — Inpatient Hospital Stay: Payer: BC Managed Care – PPO

## 2023-09-28 ENCOUNTER — Inpatient Hospital Stay (HOSPITAL_BASED_OUTPATIENT_CLINIC_OR_DEPARTMENT_OTHER): Payer: BC Managed Care – PPO | Admitting: Oncology

## 2023-09-28 VITALS — BP 145/73 | HR 58 | Temp 98.3°F | Wt 164.8 lb

## 2023-09-28 DIAGNOSIS — C182 Malignant neoplasm of ascending colon: Secondary | ICD-10-CM

## 2023-09-28 DIAGNOSIS — Z5111 Encounter for antineoplastic chemotherapy: Secondary | ICD-10-CM | POA: Diagnosis not present

## 2023-09-28 LAB — CBC WITH DIFFERENTIAL (CANCER CENTER ONLY)
Abs Immature Granulocytes: 0.03 10*3/uL (ref 0.00–0.07)
Basophils Absolute: 0 10*3/uL (ref 0.0–0.1)
Basophils Relative: 1 %
Eosinophils Absolute: 0.2 10*3/uL (ref 0.0–0.5)
Eosinophils Relative: 3 %
HCT: 28.2 % — ABNORMAL LOW (ref 36.0–46.0)
Hemoglobin: 8.8 g/dL — ABNORMAL LOW (ref 12.0–15.0)
Immature Granulocytes: 1 %
Lymphocytes Relative: 16 %
Lymphs Abs: 1 10*3/uL (ref 0.7–4.0)
MCH: 26.4 pg (ref 26.0–34.0)
MCHC: 31.2 g/dL (ref 30.0–36.0)
MCV: 84.7 fL (ref 80.0–100.0)
Monocytes Absolute: 0.5 10*3/uL (ref 0.1–1.0)
Monocytes Relative: 9 %
Neutro Abs: 4.3 10*3/uL (ref 1.7–7.7)
Neutrophils Relative %: 70 %
Platelet Count: 269 10*3/uL (ref 150–400)
RBC: 3.33 MIL/uL — ABNORMAL LOW (ref 3.87–5.11)
RDW: 20.5 % — ABNORMAL HIGH (ref 11.5–15.5)
WBC Count: 6 10*3/uL (ref 4.0–10.5)
nRBC: 0 % (ref 0.0–0.2)

## 2023-09-28 LAB — CMP (CANCER CENTER ONLY)
ALT: 14 U/L (ref 0–44)
AST: 40 U/L (ref 15–41)
Albumin: 2.7 g/dL — ABNORMAL LOW (ref 3.5–5.0)
Alkaline Phosphatase: 80 U/L (ref 38–126)
Anion gap: 12 (ref 5–15)
BUN: 16 mg/dL (ref 6–20)
CO2: 28 mmol/L (ref 22–32)
Calcium: 8.1 mg/dL — ABNORMAL LOW (ref 8.9–10.3)
Chloride: 100 mmol/L (ref 98–111)
Creatinine: 0.71 mg/dL (ref 0.44–1.00)
GFR, Estimated: >60 mL/min (ref >60)
Glucose, Bld: 123 mg/dL — ABNORMAL HIGH (ref 70–99)
Potassium: 3.1 mmol/L — ABNORMAL LOW (ref 3.5–5.1)
Sodium: 140 mmol/L (ref 135–145)
Total Bilirubin: 0.6 mg/dL (ref <1.2)
Total Protein: 6.6 g/dL (ref 6.5–8.1)

## 2023-09-28 LAB — TOTAL PROTEIN, URINE DIPSTICK: Protein, ur: NEGATIVE mg/dL

## 2023-09-28 MED ORDER — SODIUM CHLORIDE 0.9 % IV SOLN
Freq: Once | INTRAVENOUS | Status: AC
  Filled 2023-09-28: qty 250

## 2023-09-28 MED ORDER — PALONOSETRON HCL INJECTION 0.25 MG/5ML
0.2500 mg | Freq: Once | INTRAVENOUS | Status: AC
Start: 2023-09-28 — End: 2023-09-28
  Administered 2023-09-28: 0.25 mg via INTRAVENOUS
  Filled 2023-09-28: qty 5

## 2023-09-28 MED ORDER — FLUOROURACIL CHEMO INJECTION 5 GM/100ML
2400.0000 mg/m2 | INTRAVENOUS | Status: DC
  Administered 2023-09-28: 4300 mg via INTRAVENOUS
  Filled 2023-09-28: qty 86

## 2023-09-28 MED ORDER — BEVACIZUMAB-AWWB CHEMO INJECTION 400 MG/16ML
5.0000 mg/kg | Freq: Once | INTRAVENOUS | Status: AC
  Administered 2023-09-28: 350 mg via INTRAVENOUS
  Filled 2023-09-28: qty 14

## 2023-09-28 MED ORDER — DEXTROSE 5 % IV SOLN
Freq: Once | INTRAVENOUS | Status: AC
  Filled 2023-09-28: qty 250

## 2023-09-28 MED ORDER — OXALIPLATIN CHEMO INJECTION 100 MG/20ML
85.0000 mg/m2 | Freq: Once | INTRAVENOUS | Status: AC
  Administered 2023-09-28: 150 mg via INTRAVENOUS
  Filled 2023-09-28: qty 10

## 2023-09-28 MED ORDER — LEUCOVORIN CALCIUM INJECTION 350 MG
400.0000 mg/m2 | Freq: Once | INTRAVENOUS | Status: AC
  Administered 2023-09-28: 716 mg via INTRAVENOUS
  Filled 2023-09-28: qty 35.8

## 2023-09-28 MED ORDER — DEXAMETHASONE SODIUM PHOSPHATE 10 MG/ML IJ SOLN
10.0000 mg | Freq: Once | INTRAMUSCULAR | Status: AC
  Administered 2023-09-28: 10 mg via INTRAVENOUS
  Filled 2023-09-28: qty 1

## 2023-09-28 MED ORDER — FLUOROURACIL CHEMO INJECTION 2.5 GM/50ML
400.0000 mg/m2 | Freq: Once | INTRAVENOUS | Status: AC
  Administered 2023-09-28: 700 mg via INTRAVENOUS
  Filled 2023-09-28: qty 14

## 2023-09-28 NOTE — Patient Instructions (Signed)
[place] [facility] - A DEPT OF [facility]  Discharge Instructions: Thank you for choosing [facility] to provide your oncology and hematology care.  If you have a lab appointment with the Cancer Center, please go directly to the Cancer Center and check in at the registration area.  Wear comfortable clothing and clothing appropriate for easy access to any Portacath or PICC line.   We strive to give you quality time with your provider. You may need to reschedule your appointment if you arrive late (15 or more minutes).  Arriving late affects you and other patients whose appointments are after yours.  Also, if you miss three or more appointments without notifying the office, you may be dismissed from the clinic at the provider's discretion.      For prescription refill requests, have your pharmacy contact our office and allow 72 hours for refills to be completed.    Today you received the following chemotherapy and/or immunotherapy agents MVASI, Oxaliplatin, Leucovorin & Adrucil      To help prevent nausea and vomiting after your treatment, we encourage you to take your nausea medication as directed.  BELOW ARE SYMPTOMS THAT SHOULD BE REPORTED IMMEDIATELY: *FEVER GREATER THAN 100.4 F (38 C) OR HIGHER *CHILLS OR SWEATING *NAUSEA AND VOMITING THAT IS NOT CONTROLLED WITH YOUR NAUSEA MEDICATION *UNUSUAL SHORTNESS OF BREATH *UNUSUAL BRUISING OR BLEEDING *URINARY PROBLEMS (pain or burning when urinating, or frequent urination) *BOWEL PROBLEMS (unusual diarrhea, constipation, pain near the anus) TENDERNESS IN MOUTH AND THROAT WITH OR WITHOUT PRESENCE OF ULCERS (sore throat, sores in mouth, or a toothache) UNUSUAL RASH, SWELLING OR PAIN  UNUSUAL VAGINAL DISCHARGE OR ITCHING   Items with * indicate a potential emergency and should be followed up as soon as possible or go to the Emergency Department if any problems should occur.  Please show the CHEMOTHERAPY  ALERT CARD or IMMUNOTHERAPY ALERT CARD at check-in to the Emergency Department and triage nurse.  Should you have questions after your visit or need to cancel or reschedule your appointment, please contact [place] [facility] - A DEPT OF [facility]  [phone] and follow the prompts.  Office hours are 8:00 a.m. to 4:30 p.m. Monday - Friday. Please note that voicemails left after 4:00 p.m. may not be returned until the following business day.  We are closed weekends and major holidays. You have access to a nurse at all times for urgent questions. Please call the main number to the clinic [phone] and follow the prompts.  For any non-urgent questions, you may also contact your provider using MyChart. We now offer e-Visits for anyone 40 and older to request care online for non-urgent symptoms. For details visit [email or web address].   Also download the MyChart app! Go to the app store, search "MyChart", open the app, select [place], and log in with your MyChart username and password.

## 2023-09-28 NOTE — Progress Notes (Signed)
Fluid in her legs. Is it ok for her to have a half a glass of wine.

## 2023-09-28 NOTE — Progress Notes (Signed)
[facility]  [phone] [phone]  ID: Alexis Garcia OB: 03/22/1961  MR#: [id number]  NGE#:[id number]  Patient Care Team: Armando Gang, FNP as PCP - General (Family Medicine) Benita Gutter, RN as Oncology Nurse Navigator Orlie Dakin, Tollie Pizza, MD as Consulting Physician (Oncology)  CHIEF COMPLAINT: Stage IVB adenocarcinoma of the colon with liver and lung metastasis.  INTERVAL HISTORY: Patient returns to clinic today for further evaluation and consideration of cycle 3 of FOLFOX plus Avastin.  Alexis Garcia has some mild peripheral edema, but otherwise feels well.  Alexis Garcia continues to have chronic weakness and fatigue.  Alexis Garcia is tolerating her treatments without significant side effects. Alexis Garcia denies any pain.  Alexis Garcia denies any recent fevers or illnesses.  Alexis Garcia has no neurologic complaints.  Alexis Garcia has no chest pain, shortness of breath, cough, or hemoptysis.  Alexis Garcia denies any nausea, vomiting, constipation, or diarrhea.  Alexis Garcia has no melena or hematochezia.  Alexis Garcia has no urinary complaints.  Patient offers no further specific complaints today.    REVIEW OF SYSTEMS:   Review of Systems  Constitutional:  Positive for malaise/fatigue. Negative for fever and weight loss.  Respiratory: Negative.  Negative for cough, hemoptysis and shortness of breath.   Cardiovascular:  Positive for leg swelling. Negative for chest pain.  Gastrointestinal: Negative.  Negative for abdominal pain, blood in stool, constipation, diarrhea, melena, nausea and vomiting.  Genitourinary: Negative.  Negative for dysuria.  Musculoskeletal: Negative.  Negative for back pain.  Neurological:  Positive for weakness. Negative for dizziness, focal weakness and headaches.  Psychiatric/Behavioral: Negative.  The patient is not nervous/anxious.     As per HPI. Otherwise, a complete review of systems is negative.  PAST MEDICAL HISTORY: Past Medical History:  Diagnosis Date   Abnormal uterine bleeding     Anemia    Anxiety    Asthma    Cardiomegaly    Cardiovascular disease    COPD (chronic obstructive pulmonary disease) (HCC)    Depression    Diabetes mellitus without complication (HCC)    Dyspnea    Goiter, nontoxic, multinodular    Heart murmur    Hypertension    Hypothyroidism    Lung cancer, primary, with metastasis from lung to other site [facility])    Malignant neoplasm of ascending colon (HCC)    Primary adenocarcinoma of ascending colon (HCC)    Syncopal episodes    Thyrotoxicosis    Uterine leiomyoma     PAST SURGICAL HISTORY: Past Surgical History:  Procedure Laterality Date   BIOPSY  08/12/2023   Procedure: BIOPSY;  Surgeon: Norma Fredrickson, Boykin Nearing, MD;  Location: [facility] ENDOSCOPY;  Service: Gastroenterology;;   CESAREAN SECTION     COLONOSCOPY N/A 08/12/2023   Procedure: COLONOSCOPY;  Surgeon: Toledo, Boykin Nearing, MD;  Location: [facility] ENDOSCOPY;  Service: Gastroenterology;  Laterality: N/A;  3rd patient   COLONOSCOPY WITH PROPOFOL N/A 10/29/2015   Procedure: COLONOSCOPY WITH PROPOFOL;  Surgeon: Elnita Maxwell, MD;  Location: [facility] ENDOSCOPY;  Service: Endoscopy;  Laterality: N/A;   DILITATION & CURRETTAGE/HYSTROSCOPY WITH NOVASURE ABLATION N/A 06/25/2018   Procedure: DILATATION & CURETTAGE/HYSTEROSCOPY WITH MINERVA ABLATION;  Surgeon: Hildred Laser, MD;  Location: [facility] ORS;  Service: Gynecology;  Laterality: N/A;   PORTACATH PLACEMENT N/A 08/26/2023   Procedure: INSERTION PORT-A-CATH;  Surgeon: Carolan Shiver, MD;  Location: [facility] ORS;  Service: General;  Laterality: N/A;   SUBMUCOSAL TATTOO INJECTION  08/12/2023   Procedure: SUBMUCOSAL TATTOO INJECTION;  Surgeon: Toledo, Boykin Nearing, MD;  Location: [facility] ENDOSCOPY;  Service: Gastroenterology;;   TUBAL LIGATION      FAMILY HISTORY: Family History  Adopted: Yes  Problem Relation Age of Onset   Breast cancer Neg Hx     ADVANCED DIRECTIVES (Y/N):  N  HEALTH MAINTENANCE: Social History   Tobacco Use   Smoking  status: Former    Current packs/day: 0.00    Types: Cigarettes    Quit date: 2024    Years since quitting: 0.8   Smokeless tobacco: Never  Vaping Use   Vaping status: Former  Substance Use Topics   Alcohol use: Never   Drug use: Never     Colonoscopy:  PAP:  Bone density:  Lipid panel:  No Known Allergies  Current Outpatient Medications  Medication Sig Dispense Refill   albuterol (PROVENTIL HFA;VENTOLIN HFA) 108 (90 Base) MCG/ACT inhaler Inhale 2 puffs into the lungs every 6 (six) hours as needed (for exercise induced asthma).     atorvastatin (LIPITOR) 10 MG tablet SMARTSIG:1 Tablet(s) By Mouth Every Evening     ezetimibe (ZETIA) 10 MG tablet Take 10 mg by mouth daily.     levothyroxine (SYNTHROID) 75 MCG tablet Take 75 mcg by mouth daily.     lidocaine-prilocaine (EMLA) cream Apply to affected area once 30 g 3   metFORMIN (GLUCOPHAGE) 500 MG tablet Take 500 mg by mouth 2 (two) times daily.      metoprolol tartrate (LOPRESSOR) 25 MG tablet Take 25 mg by mouth 2 (two) times daily.     Multiple Vitamins-Minerals (ALIVE ONCE DAILY WOMENS PO) Take 2 tablets by mouth daily. Gummies     ondansetron (ZOFRAN) 8 MG tablet Take 1 tablet (8 mg total) by mouth every 8 (eight) hours as needed for nausea or vomiting. Start on the third day after chemotherapy. 60 tablet 1   prochlorperazine (COMPAZINE) 10 MG tablet Take 1 tablet (10 mg total) by mouth every 6 (six) hours as needed for nausea or vomiting. 60 tablet 1   No current facility-administered medications for this visit.   Facility-Administered Medications Ordered in Other Visits  Medication Dose Route Frequency Provider Last Rate Last Admin   0.9 %  sodium chloride infusion   Intravenous Once Orlie Dakin, Tollie Pizza, MD       bevacizumab-awwb (MVASI) 350 mg in sodium chloride 0.9 % 100 mL chemo infusion  5 mg/kg (Treatment Plan Recorded) Intravenous Once Jeralyn Ruths, MD       dexamethasone (DECADRON) injection 10 mg  10 mg  Intravenous Once Jeralyn Ruths, MD       dextrose 5 % solution   Intravenous Once Orlie Dakin, Tollie Pizza, MD       fluorouracil (ADRUCIL) 4,300 mg in sodium chloride 0.9 % 64 mL chemo infusion  2,400 mg/m2 (Treatment Plan Recorded) Intravenous 1 day or 1 dose Jeralyn Ruths, MD       fluorouracil (ADRUCIL) chemo injection 700 mg  400 mg/m2 (Treatment Plan Recorded) Intravenous Once Jeralyn Ruths, MD       leucovorin 716 mg in dextrose 5 % 250 mL infusion  400 mg/m2 (Treatment Plan Recorded) Intravenous Once Jeralyn Ruths, MD       oxaliplatin (ELOXATIN) 150 mg in dextrose 5 % 500 mL chemo infusion  85 mg/m2 (Treatment Plan Recorded) Intravenous Once Jeralyn Ruths, MD       palonosetron (ALOXI) injection 0.25 mg  0.25 mg Intravenous Once Jeralyn Ruths, MD        OBJECTIVE: Vitals:   09/28/23 4540  BP: (!) 145/73  Pulse: (!) 58  Temp: 98.3 F (36.8 C)  SpO2: 100%       Body mass index is 26.6 kg/m.    ECOG FS:1 - Symptomatic but completely ambulatory  General: Well-developed, well-nourished, no acute distress. Eyes: Pink conjunctiva, anicteric sclera. HEENT: Normocephalic, moist mucous membranes. Lungs: No audible wheezing or coughing. Heart: Regular rate and rhythm. Abdomen: Soft, nontender, no obvious distention. Musculoskeletal: Mild bilateral lower extremity peripheral edema. Neuro: Alert, answering all questions appropriately. Cranial nerves grossly intact. Skin: No rashes or petechiae noted. Psych: Normal affect.  LAB RESULTS:  Lab Results  Component Value Date   NA 140 09/28/2023   K 3.1 (L) 09/28/2023   CL 100 09/28/2023   CO2 28 09/28/2023   GLUCOSE 123 (H) 09/28/2023   BUN 16 09/28/2023   CREATININE 0.71 09/28/2023   CALCIUM 8.1 (L) 09/28/2023   PROT 6.6 09/28/2023   ALBUMIN 2.7 (L) 09/28/2023   AST 40 09/28/2023   ALT 14 09/28/2023   ALKPHOS 80 09/28/2023   BILITOT 0.6 09/28/2023   GFRNONAA >60 09/28/2023   GFRAA >60  06/09/2018    Lab Results  Component Value Date   WBC 6.0 09/28/2023   NEUTROABS 4.3 09/28/2023   HGB 8.8 (L) 09/28/2023   HCT 28.2 (L) 09/28/2023   MCV 84.7 09/28/2023   PLT 269 09/28/2023     STUDIES: No results found.  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  Liver biopsy completed on August 06, 2023 confirmed diagnosis.  Patient's pretreatment CEA trended up to 1780, but now has declined to 1484.  Patient underwent colonoscopy that appeared to show a complete obstruction, but patient reports Alexis Garcia is passing stool and gas.  Alexis Garcia also denies pain.  Proceed with cycle 3 of FOLFOX plus Avastin today.  Return to clinic in 2 days for pump removal and IV potassium.  Patient would then return to clinic in 2 weeks for further evaluation and consideration of cycle 4.  Plan to reimage after cycle 6. Uterine masses: Patient reports Alexis Garcia has a history of benign fibroids.  Appreciate gynecology input.  Monitor.   Anemia: Chronic and unchanged.  Patient's hemoglobin is 8.8 today.   Thrombocytosis: Resolved. Hypokalemia: Mildly improved to 3.1.  Patient will receive 40 mEq IV potassium with her pump removal in 2 days. Insomnia: Patient states Alexis Garcia will call clinic if it continues to be troublesome.  Patient will likely benefit from a referral to [facility] in the near future.  Patient expressed understanding and was in agreement with this plan. Alexis Garcia also understands that Alexis Garcia can call clinic at any time with any questions, concerns, or complaints.    Cancer Staging  Primary adenocarcinoma of ascending colon [facility]) Staging form: Colon and Rectum, AJCC 8th Edition - Clinical stage from 08/17/2023: Stage IVB (cTX, cNX, pM1b) - Signed by Jeralyn Ruths, MD on 08/17/2023 Stage prefix:  Initial diagnosis   Jeralyn Ruths, MD   09/28/2023 9:24 AM

## 2023-09-29 LAB — CEA: CEA: 1739 ng/mL — ABNORMAL HIGH (ref 0.0–4.7)

## 2023-09-30 ENCOUNTER — Inpatient Hospital Stay: Payer: BC Managed Care – PPO

## 2023-09-30 VITALS — BP 169/94 | HR 55 | Temp 97.3°F | Resp 16

## 2023-09-30 DIAGNOSIS — C182 Malignant neoplasm of ascending colon: Secondary | ICD-10-CM

## 2023-09-30 DIAGNOSIS — Z5111 Encounter for antineoplastic chemotherapy: Secondary | ICD-10-CM | POA: Diagnosis not present

## 2023-09-30 MED ORDER — SODIUM CHLORIDE 0.9 % IV SOLN
INTRAVENOUS | Status: DC
  Filled 2023-09-30: qty 250

## 2023-09-30 MED ORDER — SODIUM CHLORIDE 0.9 % IV SOLN
40.0000 meq | Freq: Once | INTRAVENOUS | Status: DC
Start: 2023-09-30 — End: 2023-09-30

## 2023-09-30 MED ORDER — POTASSIUM CHLORIDE 20 MEQ/100ML IV SOLN
20.0000 meq | Freq: Once | INTRAVENOUS | Status: AC
  Administered 2023-09-30: 20 meq via INTRAVENOUS

## 2023-09-30 NOTE — Patient Instructions (Signed)
Potassium Chloride Injection What is this medication? POTASSIUM CHLORIDE (poe TASS i um KLOOR ide) prevents and treats low levels of potassium in your body. Potassium plays an important role in maintaining the health of your kidneys, heart, muscles, and nervous system. This medicine may be used for other purposes; ask your health care provider or pharmacist if you have questions. COMMON BRAND NAME(S): PROAMP What should I tell my care team before I take this medication? They need to know if you have any of these conditions: Addison disease Dehydration Diabetes (high blood sugar) Heart disease High levels of potassium in the blood Irregular heartbeat or rhythm Kidney disease Large areas of burned skin An unusual or allergic reaction to potassium, other medications, foods, dyes, or preservatives Pregnant or trying to get pregnant Breast-feeding How should I use this medication? This medication is injected into a vein. It is given in a hospital or clinic setting. Talk to your care team about the use of this medication in children. Special care may be needed. Overdosage: If you think you have taken too much of this medicine contact a poison control center or emergency room at once. NOTE: This medicine is only for you. Do not share this medicine with others. What if I miss a dose? This does not apply. This medication is not for regular use. What may interact with this medication? Do not take this medication with any of the following: Certain diuretics, such as spironolactone, triamterene Eplerenone Sodium polystyrene sulfonate This medication may also interact with the following: Certain medications for blood pressure or heart disease, such as lisinopril, losartan, quinapril, valsartan Medications that lower your chance of fighting infection, such as cyclosporine, tacrolimus NSAIDs, medications for pain and inflammation, such as ibuprofen or naproxen Other potassium supplements Salt  substitutes This list may not describe all possible interactions. Give your health care provider a list of all the medicines, herbs, non-prescription drugs, or dietary supplements you use. Also tell them if you smoke, drink alcohol, or use illegal drugs. Some items may interact with your medicine. What should I watch for while using this medication? Visit your care team for regular checks on your progress. Tell your care team if your symptoms do not start to get better or if they get worse. You may need blood work while you are taking this medication. Avoid salt substitutes unless you are told otherwise by your care team. What side effects may I notice from receiving this medication? Side effects that you should report to your care team as soon as possible: Allergic reactions--skin rash, itching, hives, swelling of the face, lips, tongue, or throat High potassium level--muscle weakness, fast or irregular heartbeat Side effects that usually do not require medical attention (report to your care team if they continue or are bothersome): Diarrhea Nausea Stomach pain Vomiting This list may not describe all possible side effects. Call your doctor for medical advice about side effects. You may report side effects to FDA at 1-800-FDA-1088. Where should I keep my medication? This medication is given in a hospital or clinic. It will not be stored at home. NOTE: This sheet is a summary. It may not cover all possible information. If you have questions about this medicine, talk to your doctor, pharmacist, or health care provider.  2024 Elsevier/Gold Standard (2022-05-09 00:00:00)

## 2023-10-12 ENCOUNTER — Inpatient Hospital Stay: Payer: BC Managed Care – PPO | Attending: Oncology

## 2023-10-12 ENCOUNTER — Encounter: Payer: Self-pay | Admitting: Oncology

## 2023-10-12 ENCOUNTER — Inpatient Hospital Stay (HOSPITAL_BASED_OUTPATIENT_CLINIC_OR_DEPARTMENT_OTHER): Payer: BC Managed Care – PPO | Admitting: Oncology

## 2023-10-12 ENCOUNTER — Inpatient Hospital Stay: Payer: BC Managed Care – PPO

## 2023-10-12 ENCOUNTER — Telehealth: Payer: Self-pay

## 2023-10-12 VITALS — BP 160/90

## 2023-10-12 VITALS — BP 172/85 | HR 47 | Temp 98.4°F | Resp 16 | Ht 66.0 in | Wt 160.0 lb

## 2023-10-12 DIAGNOSIS — I10 Essential (primary) hypertension: Secondary | ICD-10-CM | POA: Diagnosis not present

## 2023-10-12 DIAGNOSIS — C7800 Secondary malignant neoplasm of unspecified lung: Secondary | ICD-10-CM | POA: Insufficient documentation

## 2023-10-12 DIAGNOSIS — G4700 Insomnia, unspecified: Secondary | ICD-10-CM | POA: Insufficient documentation

## 2023-10-12 DIAGNOSIS — Z87891 Personal history of nicotine dependence: Secondary | ICD-10-CM | POA: Insufficient documentation

## 2023-10-12 DIAGNOSIS — C787 Secondary malignant neoplasm of liver and intrahepatic bile duct: Secondary | ICD-10-CM | POA: Diagnosis not present

## 2023-10-12 DIAGNOSIS — Z5111 Encounter for antineoplastic chemotherapy: Secondary | ICD-10-CM | POA: Diagnosis present

## 2023-10-12 DIAGNOSIS — C182 Malignant neoplasm of ascending colon: Secondary | ICD-10-CM | POA: Diagnosis not present

## 2023-10-12 DIAGNOSIS — D649 Anemia, unspecified: Secondary | ICD-10-CM | POA: Insufficient documentation

## 2023-10-12 LAB — CBC WITH DIFFERENTIAL (CANCER CENTER ONLY)
Abs Immature Granulocytes: 0.03 10*3/uL (ref 0.00–0.07)
Basophils Absolute: 0 10*3/uL (ref 0.0–0.1)
Basophils Relative: 1 %
Eosinophils Absolute: 0.2 10*3/uL (ref 0.0–0.5)
Eosinophils Relative: 5 %
HCT: 30.7 % — ABNORMAL LOW (ref 36.0–46.0)
Hemoglobin: 9.7 g/dL — ABNORMAL LOW (ref 12.0–15.0)
Immature Granulocytes: 1 %
Lymphocytes Relative: 22 %
Lymphs Abs: 1.1 10*3/uL (ref 0.7–4.0)
MCH: 27.8 pg (ref 26.0–34.0)
MCHC: 31.6 g/dL (ref 30.0–36.0)
MCV: 88 fL (ref 80.0–100.0)
Monocytes Absolute: 0.4 10*3/uL (ref 0.1–1.0)
Monocytes Relative: 8 %
Neutro Abs: 3.1 10*3/uL (ref 1.7–7.7)
Neutrophils Relative %: 63 %
Platelet Count: 199 10*3/uL (ref 150–400)
RBC: 3.49 MIL/uL — ABNORMAL LOW (ref 3.87–5.11)
RDW: 23.3 % — ABNORMAL HIGH (ref 11.5–15.5)
WBC Count: 4.9 10*3/uL (ref 4.0–10.5)
nRBC: 0 % (ref 0.0–0.2)

## 2023-10-12 LAB — CMP (CANCER CENTER ONLY)
ALT: 14 U/L (ref 0–44)
AST: 29 U/L (ref 15–41)
Albumin: 3.1 g/dL — ABNORMAL LOW (ref 3.5–5.0)
Alkaline Phosphatase: 68 U/L (ref 38–126)
Anion gap: 9 (ref 5–15)
BUN: 15 mg/dL (ref 6–20)
CO2: 26 mmol/L (ref 22–32)
Calcium: 8.4 mg/dL — ABNORMAL LOW (ref 8.9–10.3)
Chloride: 105 mmol/L (ref 98–111)
Creatinine: 0.51 mg/dL (ref 0.44–1.00)
GFR, Estimated: >60 mL/min (ref >60)
Glucose, Bld: 93 mg/dL (ref 70–99)
Potassium: 3.7 mmol/L (ref 3.5–5.1)
Sodium: 140 mmol/L (ref 135–145)
Total Bilirubin: 0.8 mg/dL (ref <1.2)
Total Protein: 7 g/dL (ref 6.5–8.1)

## 2023-10-12 MED ORDER — PALONOSETRON HCL INJECTION 0.25 MG/5ML
0.2500 mg | Freq: Once | INTRAVENOUS | Status: AC
  Administered 2023-10-12: 0.25 mg via INTRAVENOUS
  Filled 2023-10-12: qty 5

## 2023-10-12 MED ORDER — DEXAMETHASONE SODIUM PHOSPHATE 10 MG/ML IJ SOLN
10.0000 mg | Freq: Once | INTRAMUSCULAR | Status: AC
Start: 2023-10-12 — End: 2023-10-12
  Administered 2023-10-12: 10 mg via INTRAVENOUS
  Filled 2023-10-12: qty 1

## 2023-10-12 MED ORDER — FLUOROURACIL CHEMO INJECTION 2.5 GM/50ML
400.0000 mg/m2 | Freq: Once | INTRAVENOUS | Status: AC
  Administered 2023-10-12: 700 mg via INTRAVENOUS
  Filled 2023-10-12: qty 14

## 2023-10-12 MED ORDER — SODIUM CHLORIDE 0.9 % IV SOLN
2400.0000 mg/m2 | INTRAVENOUS | Status: DC
  Administered 2023-10-12: 4300 mg via INTRAVENOUS
  Filled 2023-10-12: qty 86

## 2023-10-12 MED ORDER — SODIUM CHLORIDE 0.9 % IV SOLN
5.0000 mg/kg | Freq: Once | INTRAVENOUS | Status: AC
  Administered 2023-10-12: 350 mg via INTRAVENOUS
  Filled 2023-10-12: qty 14

## 2023-10-12 MED ORDER — DEXTROSE 5 % IV SOLN
Freq: Once | INTRAVENOUS | Status: AC
  Filled 2023-10-12: qty 250

## 2023-10-12 MED ORDER — SODIUM CHLORIDE 0.9 % IV SOLN
Freq: Once | INTRAVENOUS | Status: AC
  Filled 2023-10-12: qty 250

## 2023-10-12 MED ORDER — LEUCOVORIN CALCIUM INJECTION 350 MG
400.0000 mg/m2 | Freq: Once | INTRAMUSCULAR | Status: AC
  Administered 2023-10-12: 716 mg via INTRAVENOUS
  Filled 2023-10-12: qty 35.8

## 2023-10-12 MED ORDER — OXALIPLATIN CHEMO INJECTION 100 MG/20ML
85.0000 mg/m2 | Freq: Once | INTRAVENOUS | Status: AC
  Administered 2023-10-12: 150 mg via INTRAVENOUS
  Filled 2023-10-12: qty 30

## 2023-10-12 NOTE — Progress Notes (Signed)
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  INTERVAL HISTORY: Patient returns to clinic today for further evaluation and consideration of cycle 4 of FOLFOX plus Avastin.  She continues to have chronic weakness and fatigue, but otherwise feels well.  She is tolerating her treatments without significant side effects. She denies any pain.  She denies any recent fevers or illnesses.  She has no neurologic complaints.  She has no chest pain, shortness of breath, cough, or hemoptysis.  She denies any nausea, vomiting, constipation, or diarrhea.  She has no melena or hematochezia.  She has no urinary complaints.  Patient offers no further specific complaints today.  REVIEW OF SYSTEMS:   Review of Systems  Constitutional:  Positive for malaise/fatigue. Negative for fever and weight loss.  Respiratory: Negative.  Negative for cough, hemoptysis and shortness of breath.   Cardiovascular: Negative.  Negative for chest pain and leg swelling.  Gastrointestinal: Negative.  Negative for abdominal pain, blood in stool, constipation, diarrhea, melena, nausea and vomiting.  Genitourinary: Negative.  Negative for dysuria.  Musculoskeletal: Negative.  Negative for back pain.  Neurological:  Positive for weakness. Negative for dizziness, focal weakness and headaches.  Psychiatric/Behavioral: Negative.  The patient is not nervous/anxious.     As per HPI. Otherwise, a complete review of systems is negative.  PAST MEDICAL HISTORY: Past Medical History:  Diagnosis Date   Abnormal uterine bleeding    Anemia    Anxiety    Asthma     Cardiomegaly    Cardiovascular disease    COPD (chronic obstructive pulmonary disease) (HCC)    Depression    Diabetes mellitus without complication (HCC)    Dyspnea    Goiter, nontoxic, multinodular    Heart murmur    Hypertension    Hypothyroidism    Lung cancer, primary, with metastasis from lung to other site [facility])    Malignant neoplasm of ascending colon (HCC)    Primary adenocarcinoma of ascending colon (HCC)    Syncopal episodes    Thyrotoxicosis    Uterine leiomyoma     PAST SURGICAL HISTORY: Past Surgical History:  Procedure Laterality Date   BIOPSY  08/12/2023   Procedure: BIOPSY;  Surgeon: Norma Fredrickson, Boykin Nearing, MD;  Location: [facility] ENDOSCOPY;  Service: Gastroenterology;;   CESAREAN SECTION     COLONOSCOPY N/A 08/12/2023   Procedure: COLONOSCOPY;  Surgeon: Toledo, Boykin Nearing, MD;  Location: [facility] ENDOSCOPY;  Service: Gastroenterology;  Laterality: N/A;  3rd patient   COLONOSCOPY WITH PROPOFOL N/A 10/29/2015   Procedure: COLONOSCOPY WITH PROPOFOL;  Surgeon: Elnita Maxwell, MD;  Location: [facility] ENDOSCOPY;  Service: Endoscopy;  Laterality: N/A;   DILITATION & CURRETTAGE/HYSTROSCOPY WITH NOVASURE ABLATION N/A 06/25/2018   Procedure: DILATATION & CURETTAGE/HYSTEROSCOPY WITH MINERVA ABLATION;  Surgeon: Hildred Laser, MD;  Location: [facility] ORS;  Service: Gynecology;  Laterality: N/A;   PORTACATH PLACEMENT N/A 08/26/2023   Procedure: INSERTION PORT-A-CATH;  Surgeon: Carolan Shiver, MD;  Location: [facility] ORS;  Service: General;  Laterality: N/A;   SUBMUCOSAL TATTOO INJECTION  08/12/2023   Procedure: SUBMUCOSAL TATTOO INJECTION;  Surgeon: Toledo, Boykin Nearing, MD;  Location: [facility] ENDOSCOPY;  Service: Gastroenterology;;   TUBAL LIGATION  FAMILY HISTORY: Family History  Adopted: Yes  Problem Relation Age of Onset   Breast cancer Neg Hx     ADVANCED DIRECTIVES (Y/N):  N  HEALTH MAINTENANCE: Social History   Tobacco Use   Smoking status: Former    Current packs/day:  0.00    Types: Cigarettes    Quit date: 2024    Years since quitting: 0.9   Smokeless tobacco: Never  Vaping Use   Vaping status: Former  Substance Use Topics   Alcohol use: Never   Drug use: Never     Colonoscopy:  PAP:  Bone density:  Lipid panel:  No Known Allergies  Current Outpatient Medications  Medication Sig Dispense Refill   albuterol (PROVENTIL HFA;VENTOLIN HFA) 108 (90 Base) MCG/ACT inhaler Inhale 2 puffs into the lungs every 6 (six) hours as needed (for exercise induced asthma).     atorvastatin (LIPITOR) 10 MG tablet SMARTSIG:1 Tablet(s) By Mouth Every Evening     ezetimibe (ZETIA) 10 MG tablet Take 10 mg by mouth daily.     levothyroxine (SYNTHROID) 75 MCG tablet Take 75 mcg by mouth daily.     lidocaine-prilocaine (EMLA) cream Apply to affected area once 30 g 3   metFORMIN (GLUCOPHAGE) 500 MG tablet Take 500 mg by mouth 2 (two) times daily.      metoprolol tartrate (LOPRESSOR) 25 MG tablet Take 25 mg by mouth 2 (two) times daily.     Multiple Vitamins-Minerals (ALIVE ONCE DAILY WOMENS PO) Take 2 tablets by mouth daily. Gummies     ondansetron (ZOFRAN) 8 MG tablet Take 1 tablet (8 mg total) by mouth every 8 (eight) hours as needed for nausea or vomiting. Start on the third day after chemotherapy. 60 tablet 1   prochlorperazine (COMPAZINE) 10 MG tablet Take 1 tablet (10 mg total) by mouth every 6 (six) hours as needed for nausea or vomiting. 60 tablet 1   No current facility-administered medications for this visit.   Facility-Administered Medications Ordered in Other Visits  Medication Dose Route Frequency Provider Last Rate Last Admin   fluorouracil (ADRUCIL) 4,300 mg in sodium chloride 0.9 % 64 mL chemo infusion  2,400 mg/m2 (Treatment Plan Recorded) Intravenous 1 day or 1 dose Jeralyn Ruths, MD   Infusion Verify at 10/12/23 1319    OBJECTIVE: Vitals:   10/12/23 0838  BP: (!) 172/85  Pulse: (!) 47  Resp: 16  Temp: 98.4 F (36.9 C)  SpO2: 100%        Body mass index is 25.82 kg/m.    ECOG FS:1 - Symptomatic but completely ambulatory  General: Well-developed, well-nourished, no acute distress. Eyes: Pink conjunctiva, anicteric sclera. HEENT: Normocephalic, moist mucous membranes. Lungs: No audible wheezing or coughing. Heart: Regular rate and rhythm. Abdomen: Soft, nontender, no obvious distention. Musculoskeletal: No edema, cyanosis, or clubbing. Neuro: Alert, answering all questions appropriately. Cranial nerves grossly intact. Skin: No rashes or petechiae noted. Psych: Normal affect.  LAB RESULTS:  Lab Results  Component Value Date   NA 140 10/12/2023   K 3.7 10/12/2023   CL 105 10/12/2023   CO2 26 10/12/2023   GLUCOSE 93 10/12/2023   BUN 15 10/12/2023   CREATININE 0.51 10/12/2023   CALCIUM 8.4 (L) 10/12/2023   PROT 7.0 10/12/2023   ALBUMIN 3.1 (L) 10/12/2023   AST 29 10/12/2023   ALT 14 10/12/2023   ALKPHOS 68 10/12/2023   BILITOT 0.8 10/12/2023   GFRNONAA >60 10/12/2023   GFRAA >60 06/09/2018    Lab Results  Component Value Date   WBC 4.9 10/12/2023   NEUTROABS 3.1 10/12/2023   HGB 9.7 (L) 10/12/2023   HCT 30.7 (L) 10/12/2023   MCV 88.0 10/12/2023   PLT 199 10/12/2023     STUDIES: No results found.  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  Liver biopsy completed on August 06, 2023 confirmed diagnosis.  Patient's pretreatment CEA trended up to 1780, but now has declined to 1484.  Her most recent value has trended back up to 1739.  Patient underwent colonoscopy that appeared to show a complete obstruction, but patient reports she is passing stool and gas.  She also denies pain.  Proceed with cycle 4 of FOLFOX plus Avastin today.  Return to clinic in 2 days for pump removal and then in 2 weeks  for further evaluation and consideration of cycle 5.  Plan to reimage after cycle 6. Uterine masses: Patient reports she has a history of benign fibroids.  Appreciate gynecology input.  Monitor.   Anemia: Chronic and unchanged.  Patient's hemoglobin is mildly improved to 9.7. Thrombocytosis: Resolved. Hypokalemia: Resolved.  Monitor.   Insomnia: Patient does not complain of this today.  She will likely benefit from a referral to [facility] in the near future.  Patient expressed understanding and was in agreement with this plan. She also understands that She can call clinic at any time with any questions, concerns, or complaints.    Cancer Staging  Primary adenocarcinoma of ascending colon [facility]) Staging form: Colon and Rectum, AJCC 8th Edition - Clinical stage from 08/17/2023: Stage IVB (cTX, cNX, pM1b) - Signed by Jeralyn Ruths, MD on 08/17/2023 Stage prefix: Initial diagnosis   Jeralyn Ruths, MD   10/12/2023 3:11 PM

## 2023-10-12 NOTE — Patient Instructions (Signed)
[facility] - A DEPT OF [facility]  Discharge Instructions: Thank you for choosing [facility] to provide your oncology and hematology care.  If you have a lab appointment with the Cancer Center, please go directly to the Cancer Center and check in at the registration area.  Wear comfortable clothing and clothing appropriate for easy access to any Portacath or PICC line.   We strive to give you quality time with your provider. You may need to reschedule your appointment if you arrive late (15 or more minutes).  Arriving late affects you and other patients whose appointments are after yours.  Also, if you miss three or more appointments without notifying the office, you may be dismissed from the clinic at the provider's discretion.      For prescription refill requests, have your pharmacy contact our office and allow 72 hours for refills to be completed.    Today you received the following chemotherapy and/or immunotherapy agents- avastin, oxaliplatin, leucovorin, 5FU      To help prevent nausea and vomiting after your treatment, we encourage you to take your nausea medication as directed.  BELOW ARE SYMPTOMS THAT SHOULD BE REPORTED IMMEDIATELY: *FEVER GREATER THAN 100.4 F (38 C) OR HIGHER *CHILLS OR SWEATING *NAUSEA AND VOMITING THAT IS NOT CONTROLLED WITH YOUR NAUSEA MEDICATION *UNUSUAL SHORTNESS OF BREATH *UNUSUAL BRUISING OR BLEEDING *URINARY PROBLEMS (pain or burning when urinating, or frequent urination) *BOWEL PROBLEMS (unusual diarrhea, constipation, pain near the anus) TENDERNESS IN MOUTH AND THROAT WITH OR WITHOUT PRESENCE OF ULCERS (sore throat, sores in mouth, or a toothache) UNUSUAL RASH, SWELLING OR PAIN  UNUSUAL VAGINAL DISCHARGE OR ITCHING   Items with * indicate a potential emergency and should be followed up as soon as possible or go to the Emergency Department if any problems should occur.  Please show the CHEMOTHERAPY  ALERT CARD or IMMUNOTHERAPY ALERT CARD at check-in to the Emergency Department and triage nurse.  Should you have questions after your visit or need to cancel or reschedule your appointment, please contact [facility] - A DEPT OF [facility]  [phone] and follow the prompts.  Office hours are 8:00 a.m. to 4:30 p.m. Monday - Friday. Please note that voicemails left after 4:00 p.m. may not be returned until the following business day.  We are closed weekends and major holidays. You have access to a nurse at all times for urgent questions. Please call the main number to the clinic [phone] and follow the prompts.  For any non-urgent questions, you may also contact your provider using MyChart. We now offer e-Visits for anyone 42 and older to request care online for non-urgent symptoms. For details visit [email or web address].   Also download the MyChart app! Go to the app store, search "MyChart", open the app, select [place], and log in with your MyChart username and password.

## 2023-10-12 NOTE — Telephone Encounter (Signed)
Work note faxed per patients request to [employer] fax# [phone] or [phone].

## 2023-10-13 LAB — CEA: CEA: 778 ng/mL — ABNORMAL HIGH (ref 0.0–4.7)

## 2023-10-14 ENCOUNTER — Encounter: Payer: Self-pay | Admitting: Oncology

## 2023-10-14 ENCOUNTER — Inpatient Hospital Stay: Payer: BC Managed Care – PPO

## 2023-10-14 VITALS — BP 180/92 | HR 57 | Resp 20

## 2023-10-14 DIAGNOSIS — C182 Malignant neoplasm of ascending colon: Secondary | ICD-10-CM

## 2023-10-14 DIAGNOSIS — Z5111 Encounter for antineoplastic chemotherapy: Secondary | ICD-10-CM | POA: Diagnosis not present

## 2023-10-14 MED ORDER — SODIUM CHLORIDE 0.9% FLUSH
10.0000 mL | INTRAVENOUS | Status: DC | PRN
  Administered 2023-10-14: 10 mL
  Filled 2023-10-14: qty 10

## 2023-10-14 MED ORDER — HEPARIN SOD (PORK) LOCK FLUSH 100 UNIT/ML IV SOLN
500.0000 [IU] | Freq: Once | INTRAVENOUS | Status: AC | PRN
  Administered 2023-10-14: 500 [IU]
  Filled 2023-10-14: qty 5

## 2023-10-20 ENCOUNTER — Other Ambulatory Visit: Payer: Self-pay | Admitting: Oncology

## 2023-10-20 DIAGNOSIS — C182 Malignant neoplasm of ascending colon: Secondary | ICD-10-CM

## 2023-10-21 ENCOUNTER — Other Ambulatory Visit: Payer: Self-pay

## 2023-10-26 ENCOUNTER — Encounter: Payer: Self-pay | Admitting: Oncology

## 2023-10-26 ENCOUNTER — Inpatient Hospital Stay: Payer: BC Managed Care – PPO

## 2023-10-26 ENCOUNTER — Inpatient Hospital Stay (HOSPITAL_BASED_OUTPATIENT_CLINIC_OR_DEPARTMENT_OTHER): Payer: BC Managed Care – PPO | Admitting: Oncology

## 2023-10-26 VITALS — BP 171/94 | HR 45 | Temp 96.9°F | Resp 16 | Ht 66.0 in | Wt 160.0 lb

## 2023-10-26 VITALS — BP 180/81 | HR 50

## 2023-10-26 DIAGNOSIS — Z5111 Encounter for antineoplastic chemotherapy: Secondary | ICD-10-CM | POA: Diagnosis not present

## 2023-10-26 DIAGNOSIS — C182 Malignant neoplasm of ascending colon: Secondary | ICD-10-CM

## 2023-10-26 LAB — CBC WITH DIFFERENTIAL (CANCER CENTER ONLY)
Abs Immature Granulocytes: 0.01 10*3/uL (ref 0.00–0.07)
Basophils Absolute: 0 10*3/uL (ref 0.0–0.1)
Basophils Relative: 1 %
Eosinophils Absolute: 0.2 10*3/uL (ref 0.0–0.5)
Eosinophils Relative: 5 %
HCT: 30.7 % — ABNORMAL LOW (ref 36.0–46.0)
Hemoglobin: 9.7 g/dL — ABNORMAL LOW (ref 12.0–15.0)
Immature Granulocytes: 0 %
Lymphocytes Relative: 31 %
Lymphs Abs: 1 10*3/uL (ref 0.7–4.0)
MCH: 28.2 pg (ref 26.0–34.0)
MCHC: 31.6 g/dL (ref 30.0–36.0)
MCV: 89.2 fL (ref 80.0–100.0)
Monocytes Absolute: 0.4 10*3/uL (ref 0.1–1.0)
Monocytes Relative: 10 %
Neutro Abs: 1.8 10*3/uL (ref 1.7–7.7)
Neutrophils Relative %: 53 %
Platelet Count: 172 10*3/uL (ref 150–400)
RBC: 3.44 MIL/uL — ABNORMAL LOW (ref 3.87–5.11)
RDW: 22.9 % — ABNORMAL HIGH (ref 11.5–15.5)
WBC Count: 3.4 10*3/uL — ABNORMAL LOW (ref 4.0–10.5)
nRBC: 0 % (ref 0.0–0.2)

## 2023-10-26 LAB — CMP (CANCER CENTER ONLY)
ALT: 20 U/L (ref 0–44)
AST: 32 U/L (ref 15–41)
Albumin: 3.4 g/dL — ABNORMAL LOW (ref 3.5–5.0)
Alkaline Phosphatase: 71 U/L (ref 38–126)
Anion gap: 9 (ref 5–15)
BUN: 17 mg/dL (ref 6–20)
CO2: 25 mmol/L (ref 22–32)
Calcium: 8.6 mg/dL — ABNORMAL LOW (ref 8.9–10.3)
Chloride: 106 mmol/L (ref 98–111)
Creatinine: 0.7 mg/dL (ref 0.44–1.00)
GFR, Estimated: >60 mL/min (ref >60)
Glucose, Bld: 92 mg/dL (ref 70–99)
Potassium: 4 mmol/L (ref 3.5–5.1)
Sodium: 140 mmol/L (ref 135–145)
Total Bilirubin: 1 mg/dL (ref <1.2)
Total Protein: 7.2 g/dL (ref 6.5–8.1)

## 2023-10-26 MED ORDER — SODIUM CHLORIDE 0.9 % IV SOLN
2400.0000 mg/m2 | INTRAVENOUS | Status: DC
  Administered 2023-10-26: 4300 mg via INTRAVENOUS
  Filled 2023-10-26: qty 86

## 2023-10-26 MED ORDER — DEXTROSE 5 % IV SOLN
Freq: Once | INTRAVENOUS | Status: AC
Start: 2023-10-26 — End: 2023-10-26
  Filled 2023-10-26: qty 250

## 2023-10-26 MED ORDER — OXALIPLATIN CHEMO INJECTION 100 MG/20ML
85.0000 mg/m2 | Freq: Once | INTRAVENOUS | Status: AC
  Administered 2023-10-26: 150 mg via INTRAVENOUS
  Filled 2023-10-26: qty 22.9

## 2023-10-26 MED ORDER — LEUCOVORIN CALCIUM INJECTION 350 MG
400.0000 mg/m2 | Freq: Once | INTRAVENOUS | Status: AC
  Administered 2023-10-26: 716 mg via INTRAVENOUS
  Filled 2023-10-26: qty 35.8

## 2023-10-26 MED ORDER — FLUOROURACIL CHEMO INJECTION 2.5 GM/50ML
400.0000 mg/m2 | Freq: Once | INTRAVENOUS | Status: AC
  Administered 2023-10-26: 700 mg via INTRAVENOUS
  Filled 2023-10-26: qty 14

## 2023-10-26 MED ORDER — PALONOSETRON HCL INJECTION 0.25 MG/5ML
0.2500 mg | Freq: Once | INTRAVENOUS | Status: AC
  Administered 2023-10-26: 0.25 mg via INTRAVENOUS
  Filled 2023-10-26: qty 5

## 2023-10-26 MED ORDER — DEXAMETHASONE SODIUM PHOSPHATE 10 MG/ML IJ SOLN
10.0000 mg | Freq: Once | INTRAMUSCULAR | Status: AC
  Administered 2023-10-26: 10 mg via INTRAVENOUS
  Filled 2023-10-26: qty 1

## 2023-10-26 NOTE — Progress Notes (Addendum)
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  INTERVAL HISTORY: Patient returns to clinic today for further evaluation and consideration of cycle 5 of FOLFOX plus Avastin.  She continues to have chronic weakness and fatigue, but otherwise feels well.  She is tolerating her treatments without significant side effects.  She denies any pain.  She denies any recent fevers or illnesses.  She has no neurologic complaints.  She has no chest pain, shortness of breath, cough, or hemoptysis.  She denies any nausea, vomiting, constipation, or diarrhea.  She has no melena or hematochezia.  She has no urinary complaints.  Patient offers no further specific complaints today.  REVIEW OF SYSTEMS:   Review of Systems  Constitutional:  Positive for malaise/fatigue. Negative for fever and weight loss.  Respiratory: Negative.  Negative for cough, hemoptysis and shortness of breath.   Cardiovascular: Negative.  Negative for chest pain and leg swelling.  Gastrointestinal: Negative.  Negative for abdominal pain, blood in stool, constipation, diarrhea, melena, nausea and vomiting.  Genitourinary: Negative.  Negative for dysuria.  Musculoskeletal: Negative.  Negative for back pain.  Neurological:  Positive for weakness. Negative for dizziness, focal weakness and headaches.  Psychiatric/Behavioral: Negative.  The patient is not nervous/anxious.     As per HPI. Otherwise, a complete review of systems is negative.  PAST MEDICAL HISTORY: Past Medical History:  Diagnosis Date   Abnormal uterine bleeding    Anemia    Anxiety    Asthma     Cardiomegaly    Cardiovascular disease    COPD (chronic obstructive pulmonary disease) (HCC)    Depression    Diabetes mellitus without complication (HCC)    Dyspnea    Goiter, nontoxic, multinodular    Heart murmur    Hypertension    Hypothyroidism    Lung cancer, primary, with metastasis from lung to other site [facility])    Malignant neoplasm of ascending colon (HCC)    Primary adenocarcinoma of ascending colon (HCC)    Syncopal episodes    Thyrotoxicosis    Uterine leiomyoma     PAST SURGICAL HISTORY: Past Surgical History:  Procedure Laterality Date   BIOPSY  08/12/2023   Procedure: BIOPSY;  Surgeon: Norma Fredrickson, Boykin Nearing, MD;  Location: [facility] ENDOSCOPY;  Service: Gastroenterology;;   CESAREAN SECTION     COLONOSCOPY N/A 08/12/2023   Procedure: COLONOSCOPY;  Surgeon: Toledo, Boykin Nearing, MD;  Location: [facility] ENDOSCOPY;  Service: Gastroenterology;  Laterality: N/A;  3rd patient   COLONOSCOPY WITH PROPOFOL N/A 10/29/2015   Procedure: COLONOSCOPY WITH PROPOFOL;  Surgeon: Elnita Maxwell, MD;  Location: [facility] ENDOSCOPY;  Service: Endoscopy;  Laterality: N/A;   DILITATION & CURRETTAGE/HYSTROSCOPY WITH NOVASURE ABLATION N/A 06/25/2018   Procedure: DILATATION & CURETTAGE/HYSTEROSCOPY WITH MINERVA ABLATION;  Surgeon: Hildred Laser, MD;  Location: [facility] ORS;  Service: Gynecology;  Laterality: N/A;   PORTACATH PLACEMENT N/A 08/26/2023   Procedure: INSERTION PORT-A-CATH;  Surgeon: Carolan Shiver, MD;  Location: [facility] ORS;  Service: General;  Laterality: N/A;   SUBMUCOSAL TATTOO INJECTION  08/12/2023   Procedure: SUBMUCOSAL TATTOO INJECTION;  Surgeon: Toledo, Boykin Nearing, MD;  Location: [facility] ENDOSCOPY;  Service: Gastroenterology;;   TUBAL LIGATION  FAMILY HISTORY: Family History  Adopted: Yes  Problem Relation Age of Onset   Breast cancer Neg Hx     ADVANCED DIRECTIVES (Y/N):  N  HEALTH MAINTENANCE: Social History   Tobacco Use   Smoking status: Former    Current packs/day:  0.00    Types: Cigarettes    Quit date: 2024    Years since quitting: 0.9   Smokeless tobacco: Never  Vaping Use   Vaping status: Former  Substance Use Topics   Alcohol use: Never   Drug use: Never     Colonoscopy:  PAP:  Bone density:  Lipid panel:  No Known Allergies  Current Outpatient Medications  Medication Sig Dispense Refill   albuterol (PROVENTIL HFA;VENTOLIN HFA) 108 (90 Base) MCG/ACT inhaler Inhale 2 puffs into the lungs every 6 (six) hours as needed (for exercise induced asthma).     atorvastatin (LIPITOR) 10 MG tablet SMARTSIG:1 Tablet(s) By Mouth Every Evening     ezetimibe (ZETIA) 10 MG tablet Take 10 mg by mouth daily.     hydrALAZINE (APRESOLINE) 10 MG tablet Take 10 mg by mouth 3 (three) times daily as needed.     levothyroxine (SYNTHROID) 75 MCG tablet Take 75 mcg by mouth daily.     lidocaine-prilocaine (EMLA) cream Apply to affected area once 30 g 3   metFORMIN (GLUCOPHAGE) 500 MG tablet Take 500 mg by mouth 2 (two) times daily.      metoprolol tartrate (LOPRESSOR) 25 MG tablet Take 25 mg by mouth 2 (two) times daily.     Multiple Vitamins-Minerals (ALIVE ONCE DAILY WOMENS PO) Take 2 tablets by mouth daily. Gummies     ondansetron (ZOFRAN) 8 MG tablet Take 1 tablet (8 mg total) by mouth every 8 (eight) hours as needed for nausea or vomiting. Start on the third day after chemotherapy. 60 tablet 1   prochlorperazine (COMPAZINE) 10 MG tablet Take 1 tablet (10 mg total) by mouth every 6 (six) hours as needed for nausea or vomiting. 60 tablet 1   No current facility-administered medications for this visit.   Facility-Administered Medications Ordered in Other Visits  Medication Dose Route Frequency Provider Last Rate Last Admin   fluorouracil (ADRUCIL) 4,300 mg in sodium chloride 0.9 % 64 mL chemo infusion  2,400 mg/m2 (Treatment Plan Recorded) Intravenous 1 day or 1 dose Orlie Dakin, Tollie Pizza, MD       fluorouracil (ADRUCIL) chemo injection 700 mg  400 mg/m2  (Treatment Plan Recorded) Intravenous Once Jeralyn Ruths, MD       leucovorin 716 mg in dextrose 5 % 250 mL infusion  400 mg/m2 (Treatment Plan Recorded) Intravenous Once Jeralyn Ruths, MD       oxaliplatin (ELOXATIN) 150 mg in dextrose 5 % 500 mL chemo infusion  85 mg/m2 (Treatment Plan Recorded) Intravenous Once Jeralyn Ruths, MD        OBJECTIVE: Vitals:   10/26/23 0906  BP: (!) 171/94  Pulse: (!) 45  Resp: 16  Temp: (!) 96.9 F (36.1 C)  SpO2: 100%       Body mass index is 25.82 kg/m.    ECOG FS:1 - Symptomatic but completely ambulatory  General: Well-developed, well-nourished, no acute distress. Eyes: Pink conjunctiva, anicteric sclera. HEENT: Normocephalic, moist mucous membranes. Lungs: No audible wheezing or coughing. Heart: Regular rate and rhythm. Abdomen: Soft, nontender, no obvious distention. Musculoskeletal: No edema, cyanosis, or clubbing. Neuro: Alert, answering all questions appropriately. Cranial nerves grossly intact. Skin: No rashes or petechiae noted. Psych:  Normal affect.  LAB RESULTS:  Lab Results  Component Value Date   NA 140 10/26/2023   K 4.0 10/26/2023   CL 106 10/26/2023   CO2 25 10/26/2023   GLUCOSE 92 10/26/2023   BUN 17 10/26/2023   CREATININE 0.70 10/26/2023   CALCIUM 8.6 (L) 10/26/2023   PROT 7.2 10/26/2023   ALBUMIN 3.4 (L) 10/26/2023   AST 32 10/26/2023   ALT 20 10/26/2023   ALKPHOS 71 10/26/2023   BILITOT 1.0 10/26/2023   GFRNONAA >60 10/26/2023   GFRAA >60 06/09/2018    Lab Results  Component Value Date   WBC 3.4 (L) 10/26/2023   NEUTROABS 1.8 10/26/2023   HGB 9.7 (L) 10/26/2023   HCT 30.7 (L) 10/26/2023   MCV 89.2 10/26/2023   PLT 172 10/26/2023     STUDIES: No results found.  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  Liver biopsy completed on August 06, 2023 confirmed diagnosis.  Patient's pretreatment CEA trended up to 1780, but now has declined to 1484.  Her most recent value has trended back up to 1739.  Patient underwent colonoscopy that appeared to show a complete obstruction, but patient reports she is passing stool and gas.  She also denies pain.  Proceed with cycle 5 of FOLFOX today.  Avastin was not given secondary to elevated blood pressure today.  Return to clinic in 2 days for pump removal and then in 2 weeks for further evaluation and consideration of cycle 6.  Plan to reimage after cycle 6. Uterine masses: Patient reports she has a history of benign fibroids.  Appreciate gynecology input.  Monitor.   Anemia: Chronic and unchanged.  Patient's hemoglobin is 9.7.   Hypokalemia: Resolved.  Monitor.   Insomnia: Significantly improved. Coping/adjustment: Patient was given a referral to [facility]. Hypertension: Patient's blood pressure moderately elevated today.  Hold Avastin as above.  Continue monitoring and treatment per primary care.    Patient expressed understanding and was in agreement with this plan. She also understands that She can call clinic at any time with any questions, concerns, or complaints.    Cancer Staging  Primary adenocarcinoma of ascending colon [facility]) Staging form: Colon and Rectum, AJCC 8th Edition - Clinical stage from 08/17/2023: Stage IVB (cTX, cNX, pM1b) - Signed by Jeralyn Ruths, MD on 08/17/2023 Stage prefix: Initial diagnosis   Jeralyn Ruths, MD   10/26/2023 10:29 AM

## 2023-10-26 NOTE — Patient Instructions (Signed)
[facility] - A DEPT OF [facility]  Discharge Instructions: Thank you for choosing [facility] to provide your oncology and hematology care.  If you have a lab appointment with the Cancer Center, please go directly to the Cancer Center and check in at the registration area.  Wear comfortable clothing and clothing appropriate for easy access to any Portacath or PICC line.   We strive to give you quality time with your provider. You may need to reschedule your appointment if you arrive late (15 or more minutes).  Arriving late affects you and other patients whose appointments are after yours.  Also, if you miss three or more appointments without notifying the office, you may be dismissed from the clinic at the provider's discretion.      For prescription refill requests, have your pharmacy contact our office and allow 72 hours for refills to be completed.    Today you received the following chemotherapy and/or immunotherapy agents Oxaliplatin, Leucovorin, Adrucil      To help prevent nausea and vomiting after your treatment, we encourage you to take your nausea medication as directed.  BELOW ARE SYMPTOMS THAT SHOULD BE REPORTED IMMEDIATELY: *FEVER GREATER THAN 100.4 F (38 C) OR HIGHER *CHILLS OR SWEATING *NAUSEA AND VOMITING THAT IS NOT CONTROLLED WITH YOUR NAUSEA MEDICATION *UNUSUAL SHORTNESS OF BREATH *UNUSUAL BRUISING OR BLEEDING *URINARY PROBLEMS (pain or burning when urinating, or frequent urination) *BOWEL PROBLEMS (unusual diarrhea, constipation, pain near the anus) TENDERNESS IN MOUTH AND THROAT WITH OR WITHOUT PRESENCE OF ULCERS (sore throat, sores in mouth, or a toothache) UNUSUAL RASH, SWELLING OR PAIN  UNUSUAL VAGINAL DISCHARGE OR ITCHING   Items with * indicate a potential emergency and should be followed up as soon as possible or go to the Emergency Department if any problems should occur.  Please show the CHEMOTHERAPY ALERT  CARD or IMMUNOTHERAPY ALERT CARD at check-in to the Emergency Department and triage nurse.  Should you have questions after your visit or need to cancel or reschedule your appointment, please contact [facility] - A DEPT OF [facility]  [phone] and follow the prompts.  Office hours are 8:00 a.m. to 4:30 p.m. Monday - Friday. Please note that voicemails left after 4:00 p.m. may not be returned until the following business day.  We are closed weekends and major holidays. You have access to a nurse at all times for urgent questions. Please call the main number to the clinic [phone] and follow the prompts.  For any non-urgent questions, you may also contact your provider using MyChart. We now offer e-Visits for anyone 78 and older to request care online for non-urgent symptoms. For details visit [email or web address].   Also download the MyChart app! Go to the app store, search "MyChart", open the app, select [place], and log in with your MyChart username and password.

## 2023-10-27 LAB — CEA: CEA: 593 ng/mL — ABNORMAL HIGH (ref 0.0–4.7)

## 2023-10-28 ENCOUNTER — Inpatient Hospital Stay: Payer: BC Managed Care – PPO

## 2023-10-28 ENCOUNTER — Telehealth: Payer: Self-pay | Admitting: *Deleted

## 2023-10-28 VITALS — BP 181/92 | HR 56 | Resp 18

## 2023-10-28 DIAGNOSIS — C182 Malignant neoplasm of ascending colon: Secondary | ICD-10-CM

## 2023-10-28 DIAGNOSIS — Z5111 Encounter for antineoplastic chemotherapy: Secondary | ICD-10-CM | POA: Diagnosis not present

## 2023-10-28 MED ORDER — SODIUM CHLORIDE 0.9% FLUSH
10.0000 mL | INTRAVENOUS | Status: DC | PRN
  Administered 2023-10-28: 10 mL
  Filled 2023-10-28: qty 10

## 2023-10-28 MED ORDER — HEPARIN SOD (PORK) LOCK FLUSH 100 UNIT/ML IV SOLN
500.0000 [IU] | Freq: Once | INTRAVENOUS | Status: AC | PRN
  Administered 2023-10-28: 500 [IU]
  Filled 2023-10-28: qty 5

## 2023-10-28 NOTE — Telephone Encounter (Signed)
Received Critical Illness form for this patient. Form completed and sent for doctor signature

## 2023-10-29 ENCOUNTER — Inpatient Hospital Stay: Payer: BC Managed Care – PPO

## 2023-10-29 NOTE — Progress Notes (Signed)
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  Patient receiving folfox and avastin.    Met with patient in clinic.  Reports that her appetite is better.  Taste alterations have improved and she is not tasting metal as before.  Reports that she usually has berries with 1 egg omelette with vegetables (spinach, mushrooms) every morning and coffee.  May have yogurt snack.  Lunch is [place] sandwich on oat/wheat bread with lettuce and tomato, mustard. Sometimes has chips. Always has fruit.  Dinner maybe an impossible burger or son will cook (meat with vegetables).  Drinking ensure shakes at times but not daily.  Denies nausea. Reports bowel movement 1-2 times a day    Medications: reviewed  Labs: reviewed  Anthropometrics:   Weight 166 lb 7 oz today  159 lb on 11/4 UBW of 240 lb over 1 year ago   NUTRITION DIAGNOSIS: Unintentional weight loss improved    INTERVENTION:  Can discontinue oral nutrition supplement as appetite and weight has increased Reviewed AICR guidelines with patient as appetite has improved and weight has improved.      MONITORING, EVALUATION, GOAL: weight trends, intake   NEXT VISIT: Wed, Jan 29 after pump removed  Bishop Vanderwerf B. Freida Busman, RD, LDN Registered Dietitian [phone]

## 2023-10-30 ENCOUNTER — Other Ambulatory Visit: Payer: Self-pay

## 2023-10-30 ENCOUNTER — Encounter: Payer: Self-pay | Admitting: Oncology

## 2023-10-30 NOTE — Telephone Encounter (Signed)
Form signed and faxed, copied for chart. Patient notified via MyChart message

## 2023-11-05 ENCOUNTER — Encounter: Payer: Self-pay | Admitting: Oncology

## 2023-11-09 ENCOUNTER — Ambulatory Visit: Payer: BC Managed Care – PPO | Admitting: Oncology

## 2023-11-09 ENCOUNTER — Ambulatory Visit: Payer: BC Managed Care – PPO

## 2023-11-09 ENCOUNTER — Other Ambulatory Visit: Payer: BC Managed Care – PPO

## 2023-11-10 ENCOUNTER — Encounter: Payer: Self-pay | Admitting: Oncology

## 2023-11-10 ENCOUNTER — Inpatient Hospital Stay: Payer: BC Managed Care – PPO

## 2023-11-10 ENCOUNTER — Inpatient Hospital Stay (HOSPITAL_BASED_OUTPATIENT_CLINIC_OR_DEPARTMENT_OTHER): Payer: BC Managed Care – PPO | Admitting: Oncology

## 2023-11-10 VITALS — BP 165/88 | HR 57 | Temp 97.9°F | Resp 19

## 2023-11-10 VITALS — BP 168/96 | HR 56 | Temp 97.8°F | Wt 163.5 lb

## 2023-11-10 DIAGNOSIS — C182 Malignant neoplasm of ascending colon: Secondary | ICD-10-CM | POA: Diagnosis not present

## 2023-11-10 DIAGNOSIS — Z5111 Encounter for antineoplastic chemotherapy: Secondary | ICD-10-CM | POA: Diagnosis not present

## 2023-11-10 LAB — CBC WITH DIFFERENTIAL (CANCER CENTER ONLY)
Abs Immature Granulocytes: 0.01 10*3/uL (ref 0.00–0.07)
Basophils Absolute: 0 10*3/uL (ref 0.0–0.1)
Basophils Relative: 1 %
Eosinophils Absolute: 0.1 10*3/uL (ref 0.0–0.5)
Eosinophils Relative: 4 %
HCT: 32.5 % — ABNORMAL LOW (ref 36.0–46.0)
Hemoglobin: 10.2 g/dL — ABNORMAL LOW (ref 12.0–15.0)
Immature Granulocytes: 0 %
Lymphocytes Relative: 35 %
Lymphs Abs: 1.1 10*3/uL (ref 0.7–4.0)
MCH: 28.8 pg (ref 26.0–34.0)
MCHC: 31.4 g/dL (ref 30.0–36.0)
MCV: 91.8 fL (ref 80.0–100.0)
Monocytes Absolute: 0.4 10*3/uL (ref 0.1–1.0)
Monocytes Relative: 12 %
Neutro Abs: 1.6 10*3/uL — ABNORMAL LOW (ref 1.7–7.7)
Neutrophils Relative %: 48 %
Platelet Count: 174 10*3/uL (ref 150–400)
RBC: 3.54 MIL/uL — ABNORMAL LOW (ref 3.87–5.11)
RDW: 21.3 % — ABNORMAL HIGH (ref 11.5–15.5)
WBC Count: 3.3 10*3/uL — ABNORMAL LOW (ref 4.0–10.5)
nRBC: 0 % (ref 0.0–0.2)

## 2023-11-10 LAB — CMP (CANCER CENTER ONLY)
ALT: 23 U/L (ref 0–44)
AST: 31 U/L (ref 15–41)
Albumin: 3.4 g/dL — ABNORMAL LOW (ref 3.5–5.0)
Alkaline Phosphatase: 65 U/L (ref 38–126)
Anion gap: 8 (ref 5–15)
BUN: 24 mg/dL — ABNORMAL HIGH (ref 6–20)
CO2: 26 mmol/L (ref 22–32)
Calcium: 8.7 mg/dL — ABNORMAL LOW (ref 8.9–10.3)
Chloride: 105 mmol/L (ref 98–111)
Creatinine: 0.66 mg/dL (ref 0.44–1.00)
GFR, Estimated: >60 mL/min (ref >60)
Glucose, Bld: 96 mg/dL (ref 70–99)
Potassium: 3.8 mmol/L (ref 3.5–5.1)
Sodium: 139 mmol/L (ref 135–145)
Total Bilirubin: 0.6 mg/dL (ref 0.0–1.2)
Total Protein: 7.3 g/dL (ref 6.5–8.1)

## 2023-11-10 LAB — TOTAL PROTEIN, URINE DIPSTICK: Protein, ur: NEGATIVE mg/dL

## 2023-11-10 MED ORDER — PALONOSETRON HCL INJECTION 0.25 MG/5ML
0.2500 mg | Freq: Once | INTRAVENOUS | Status: AC
  Administered 2023-11-10: 0.25 mg via INTRAVENOUS
  Filled 2023-11-10: qty 5

## 2023-11-10 MED ORDER — OXALIPLATIN CHEMO INJECTION 100 MG/20ML
85.0000 mg/m2 | Freq: Once | INTRAVENOUS | Status: AC
  Administered 2023-11-10: 150 mg via INTRAVENOUS
  Filled 2023-11-10: qty 30

## 2023-11-10 MED ORDER — DEXTROSE 5 % IV SOLN
Freq: Once | INTRAVENOUS | Status: DC
  Filled 2023-11-10: qty 250

## 2023-11-10 MED ORDER — SODIUM CHLORIDE 0.9 % IV SOLN
5.0000 mg/kg | Freq: Once | INTRAVENOUS | Status: AC
  Administered 2023-11-10: 350 mg via INTRAVENOUS
  Filled 2023-11-10: qty 14

## 2023-11-10 MED ORDER — SODIUM CHLORIDE 0.9 % IV SOLN
Freq: Once | INTRAVENOUS | Status: AC
  Filled 2023-11-10: qty 250

## 2023-11-10 MED ORDER — SODIUM CHLORIDE 0.9 % IV SOLN
2400.0000 mg/m2 | INTRAVENOUS | Status: DC
  Administered 2023-11-10: 4300 mg via INTRAVENOUS
  Filled 2023-11-10: qty 86

## 2023-11-10 MED ORDER — FLUOROURACIL CHEMO INJECTION 2.5 GM/50ML
400.0000 mg/m2 | Freq: Once | INTRAVENOUS | Status: AC
  Administered 2023-11-10: 700 mg via INTRAVENOUS
  Filled 2023-11-10: qty 14

## 2023-11-10 MED ORDER — DEXAMETHASONE SODIUM PHOSPHATE 10 MG/ML IJ SOLN
10.0000 mg | Freq: Once | INTRAMUSCULAR | Status: AC
  Administered 2023-11-10: 10 mg via INTRAVENOUS
  Filled 2023-11-10: qty 1

## 2023-11-10 MED ORDER — DEXTROSE 5 % IV SOLN
Freq: Once | INTRAVENOUS | Status: AC
  Filled 2023-11-10: qty 250

## 2023-11-10 MED ORDER — LEUCOVORIN CALCIUM INJECTION 350 MG
400.0000 mg/m2 | Freq: Once | INTRAVENOUS | Status: AC
  Administered 2023-11-10: 716 mg via INTRAVENOUS
  Filled 2023-11-10: qty 35.8

## 2023-11-10 NOTE — Progress Notes (Signed)
Per Dr. Orlie Dakin  OK to proceed with Mvasi with SBP > 160   Sharen Hones, PharmD, BCPS Clinical Pharmacist

## 2023-11-10 NOTE — Patient Instructions (Signed)
 [facility] - A DEPT OF [facility]  Discharge Instructions: Thank you for choosing [facility] to provide your oncology and hematology care.  If you have a lab appointment with the Cancer Center, please go directly to the Cancer Center and check in at the registration area.  Wear comfortable clothing and clothing appropriate for easy access to any Portacath or PICC line.   We strive to give you quality time with your provider. You may need to reschedule your appointment if you arrive late (15 or more minutes).  Arriving late affects you and other patients whose appointments are after yours.  Also, if you miss three or more appointments without notifying the office, you may be dismissed from the clinic at the provider's discretion.      For prescription refill requests, have your pharmacy contact our office and allow 72 hours for refills to be completed.    Today you received the following chemotherapy and/or immunotherapy agents mvasi , oxaliplatin , leucovorin , and adrucil       To help prevent nausea and vomiting after your treatment, we encourage you to take your nausea medication as directed.  BELOW ARE SYMPTOMS THAT SHOULD BE REPORTED IMMEDIATELY: *FEVER GREATER THAN 100.4 F (38 C) OR HIGHER *CHILLS OR SWEATING *NAUSEA AND VOMITING THAT IS NOT CONTROLLED WITH YOUR NAUSEA MEDICATION *UNUSUAL SHORTNESS OF BREATH *UNUSUAL BRUISING OR BLEEDING *URINARY PROBLEMS (pain or burning when urinating, or frequent urination) *BOWEL PROBLEMS (unusual diarrhea, constipation, pain near the anus) TENDERNESS IN MOUTH AND THROAT WITH OR WITHOUT PRESENCE OF ULCERS (sore throat, sores in mouth, or a toothache) UNUSUAL RASH, SWELLING OR PAIN  UNUSUAL VAGINAL DISCHARGE OR ITCHING   Items with * indicate a potential emergency and should be followed up as soon as possible or go to the Emergency Department if any problems should occur.  Please show the  CHEMOTHERAPY ALERT CARD or IMMUNOTHERAPY ALERT CARD at check-in to the Emergency Department and triage nurse.  Should you have questions after your visit or need to cancel or reschedule your appointment, please contact [facility] - A DEPT OF [facility]  [phone] and follow the prompts.  Office hours are 8:00 a.m. to 4:30 p.m. Monday - Friday. Please note that voicemails left after 4:00 p.m. may not be returned until the following business day.  We are closed weekends and major holidays. You have access to a nurse at all times for urgent questions. Please call the main number to the clinic [phone] and follow the prompts.  For any non-urgent questions, you may also contact your provider using MyChart. We now offer e-Visits for anyone 4 and older to request care online for non-urgent symptoms. For details visit [email or web address].   Also download the MyChart app! Go to the app store, search MyChart, open the app, select [place], and log in with your MyChart username and password.

## 2023-11-10 NOTE — Progress Notes (Signed)
 [facility]  Telephone:(336) [phone] Fax:(336) [phone]  ID: Alexis Garcia OB: 1961/09/09  MR#: [id number]  RDW#:[id number]  Patient Care Team: Donal Channing SQUIBB, FNP as PCP - General (Family Medicine) Maurie Rayfield BIRCH, RN as Oncology Nurse Navigator Jacobo, Evalene PARAS, MD as Consulting Physician (Oncology)  CHIEF COMPLAINT: Stage IVB adenocarcinoma of the colon with liver and lung metastasis.  INTERVAL HISTORY: Patient returns to clinic today for further evaluation and consideration of cycle 6 of FOLFOX plus Avastin .  She has increased fatigue today, but attributes this to an increased number of visitors during the holiday season.  She otherwise feels well.  She is tolerating her treatments without significant side effects.  She denies any pain.  She denies any recent fevers or illnesses.  She has no neurologic complaints.  She has no chest pain, shortness of breath, cough, or hemoptysis.  She denies any nausea, vomiting, constipation, or diarrhea.  She has no melena or hematochezia.  She has no urinary complaints.  Patient offers no further specific complaints today.  REVIEW OF SYSTEMS:   Review of Systems  Constitutional:  Positive for malaise/fatigue. Negative for fever and weight loss.  Respiratory: Negative.  Negative for cough, hemoptysis and shortness of breath.   Cardiovascular: Negative.  Negative for chest pain and leg swelling.  Gastrointestinal: Negative.  Negative for abdominal pain, blood in stool, constipation, diarrhea, melena, nausea and vomiting.  Genitourinary: Negative.  Negative for dysuria.  Musculoskeletal: Negative.  Negative for back pain.  Neurological:  Positive for weakness. Negative for dizziness, focal weakness and headaches.  Psychiatric/Behavioral: Negative.  The patient is not nervous/anxious.     As per HPI. Otherwise, a complete review of systems is negative.  PAST MEDICAL HISTORY: Past Medical History:  Diagnosis Date   Abnormal  uterine bleeding    Anemia    Anxiety    Asthma    Cardiomegaly    Cardiovascular disease    COPD (chronic obstructive pulmonary disease) (HCC)    Depression    Diabetes mellitus without complication (HCC)    Dyspnea    Goiter, nontoxic, multinodular    Heart murmur    Hypertension    Hypothyroidism    Lung cancer, primary, with metastasis from lung to other site [facility])    Malignant neoplasm of ascending colon (HCC)    Primary adenocarcinoma of ascending colon (HCC)    Syncopal episodes    Thyrotoxicosis    Uterine leiomyoma     PAST SURGICAL HISTORY: Past Surgical History:  Procedure Laterality Date   BIOPSY  08/12/2023   Procedure: BIOPSY;  Surgeon: Aundria, Ladell POUR, MD;  Location: [facility] ENDOSCOPY;  Service: Gastroenterology;;   CESAREAN SECTION     COLONOSCOPY N/A 08/12/2023   Procedure: COLONOSCOPY;  Surgeon: Toledo, Ladell POUR, MD;  Location: [facility] ENDOSCOPY;  Service: Gastroenterology;  Laterality: N/A;  3rd patient   COLONOSCOPY WITH PROPOFOL  N/A 10/29/2015   Procedure: COLONOSCOPY WITH PROPOFOL ;  Surgeon: Donnice Vaughn Manes, MD;  Location: [facility] ENDOSCOPY;  Service: Endoscopy;  Laterality: N/A;   DILITATION & CURRETTAGE/HYSTROSCOPY WITH NOVASURE ABLATION N/A 06/25/2018   Procedure: DILATATION & CURETTAGE/HYSTEROSCOPY WITH MINERVA ABLATION;  Surgeon: Connell Davies, MD;  Location: [facility] ORS;  Service: Gynecology;  Laterality: N/A;   PORTACATH PLACEMENT N/A 08/26/2023   Procedure: INSERTION PORT-A-CATH;  Surgeon: Rodolph Romano, MD;  Location: [facility] ORS;  Service: General;  Laterality: N/A;   SUBMUCOSAL TATTOO INJECTION  08/12/2023   Procedure: SUBMUCOSAL TATTOO INJECTION;  Surgeon: Aundria, Teodoro K, MD;  Location: [facility] ENDOSCOPY;  Service: Gastroenterology;;   TUBAL LIGATION      FAMILY HISTORY: Family History  Adopted: Yes  Problem Relation Age of Onset   Breast cancer Neg Hx     ADVANCED DIRECTIVES (Y/N):  N  HEALTH MAINTENANCE: Social History   Tobacco  Use   Smoking status: Former    Current packs/day: 0.00    Types: Cigarettes    Quit date: 2024    Years since quitting: 1.0   Smokeless tobacco: Never  Vaping Use   Vaping status: Former  Substance Use Topics   Alcohol use: Never   Drug use: Never     Colonoscopy:  PAP:  Bone density:  Lipid panel:  No Known Allergies  Current Outpatient Medications  Medication Sig Dispense Refill   albuterol  (PROVENTIL  HFA;VENTOLIN  HFA) 108 (90 Base) MCG/ACT inhaler Inhale 2 puffs into the lungs every 6 (six) hours as needed (for exercise induced asthma).     atorvastatin  (LIPITOR) 10 MG tablet SMARTSIG:1 Tablet(s) By Mouth Every Evening     ezetimibe (ZETIA) 10 MG tablet Take 10 mg by mouth daily.     hydrALAZINE  (APRESOLINE ) 10 MG tablet Take 10 mg by mouth 3 (three) times daily as needed.     levothyroxine  (SYNTHROID ) 75 MCG tablet Take 75 mcg by mouth daily.     lidocaine -prilocaine  (EMLA ) cream Apply to affected area once 30 g 3   metFORMIN (GLUCOPHAGE) 500 MG tablet Take 500 mg by mouth 2 (two) times daily.      metoprolol tartrate (LOPRESSOR) 25 MG tablet Take 25 mg by mouth 2 (two) times daily.     Multiple Vitamins-Minerals (ALIVE ONCE DAILY WOMENS PO) Take 2 tablets by mouth daily. Gummies     ondansetron  (ZOFRAN ) 8 MG tablet Take 1 tablet (8 mg total) by mouth every 8 (eight) hours as needed for nausea or vomiting. Start on the third day after chemotherapy. 60 tablet 1   prochlorperazine  (COMPAZINE ) 10 MG tablet Take 1 tablet (10 mg total) by mouth every 6 (six) hours as needed for nausea or vomiting. 60 tablet 1   No current facility-administered medications for this visit.   Facility-Administered Medications Ordered in Other Visits  Medication Dose Route Frequency Provider Last Rate Last Admin   0.9 %  sodium chloride  infusion   Intravenous Once Jacobo, Iwalani Templeton J, MD       bevacizumab -awwb (MVASI ) 350 mg in sodium chloride  0.9 % 100 mL chemo infusion  5 mg/kg (Treatment Plan  Recorded) Intravenous Once Malik Paar J, MD       dexamethasone  (DECADRON ) injection 10 mg  10 mg Intravenous Once Jaykub Mackins J, MD       dextrose  5 % solution   Intravenous Once Nanna Ertle J, MD       dextrose  5 % solution   Intravenous Once Tierrah Anastos J, MD       fluorouracil  (ADRUCIL ) 4,300 mg in sodium chloride  0.9 % 64 mL chemo infusion  2,400 mg/m2 (Treatment Plan Recorded) Intravenous 1 day or 1 dose Venita Seng J, MD       fluorouracil  (ADRUCIL ) chemo injection 700 mg  400 mg/m2 (Treatment Plan Recorded) Intravenous Once Hooria Gasparini J, MD       leucovorin  716 mg in dextrose  5 % 250 mL infusion  400 mg/m2 (Treatment Plan Recorded) Intravenous Once Zari Cly J, MD       oxaliplatin  (ELOXATIN ) 150 mg in dextrose  5 % 500 mL chemo infusion  85 mg/m2 (  Treatment Plan Recorded) Intravenous Once Katilin Raynes J, MD       palonosetron  (ALOXI ) injection 0.25 mg  0.25 mg Intravenous Once Jacobo Evalene PARAS, MD        OBJECTIVE: Vitals:   11/10/23 0849  BP: (!) 168/96  Pulse: (!) 56  Temp: 97.8 F (36.6 C)  SpO2: 99%       Body mass index is 26.39 kg/m.    ECOG FS:1 - Symptomatic but completely ambulatory  General: Well-developed, well-nourished, no acute distress. Eyes: Pink conjunctiva, anicteric sclera. HEENT: Normocephalic, moist mucous membranes. Lungs: No audible wheezing or coughing. Heart: Regular rate and rhythm. Abdomen: Soft, nontender, no obvious distention. Musculoskeletal: No edema, cyanosis, or clubbing. Neuro: Alert, answering all questions appropriately. Cranial nerves grossly intact. Skin: No rashes or petechiae noted. Psych: Normal affect.  LAB RESULTS:  Lab Results  Component Value Date   NA 139 11/10/2023   K 3.8 11/10/2023   CL 105 11/10/2023   CO2 26 11/10/2023   GLUCOSE 96 11/10/2023   BUN 24 (H) 11/10/2023   CREATININE 0.66 11/10/2023   CALCIUM  8.7 (L) 11/10/2023   PROT 7.3 11/10/2023   ALBUMIN   3.4 (L) 11/10/2023   AST 31 11/10/2023   ALT 23 11/10/2023   ALKPHOS 65 11/10/2023   BILITOT 0.6 11/10/2023   GFRNONAA >60 11/10/2023   GFRAA >60 06/09/2018    Lab Results  Component Value Date   WBC 3.3 (L) 11/10/2023   NEUTROABS 1.6 (L) 11/10/2023   HGB 10.2 (L) 11/10/2023   HCT 32.5 (L) 11/10/2023   MCV 91.8 11/10/2023   PLT 174 11/10/2023     STUDIES: No results found.  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  Liver biopsy completed on August 06, 2023 confirmed diagnosis.  Patient's pretreatment CEA was 1780, but now has declined to 593.  Patient underwent colonoscopy that appeared to show a complete obstruction, but patient reports she is passing stool and gas.  She also denies pain.  Proceed with cycle 6 of FOLFOX plus Avastin  today.  Return to clinic in 2 days for pump removal and then in 2 weeks for further evaluation and consideration of cycle 7.  Will repeat CT scan of the chest, abdomen, and pelvis prior to next treatment.   Uterine masses: Patient reports she has a history of benign fibroids.  Appreciate gynecology input.  Monitor.   Anemia: Patient's hemoglobin mildly improved to 10.2, monitor. Hypokalemia: Resolved.  Monitor.   Insomnia: Significantly improved. Coping/adjustment: Patient was previously given a referral to [facility]. Hypertension: Patient blood pressure is moderately elevated today.  Proceed cautiously with Avastin .  Continue monitoring and treatment per primary care.    Patient expressed understanding and was in agreement with this plan. She also understands that She can call clinic at any time with any questions, concerns, or complaints.    Cancer Staging  Primary adenocarcinoma of ascending colon [facility]) Staging form: Colon and  Rectum, AJCC 8th Edition - Clinical stage from 08/17/2023: Stage IVB (cTX, cNX, pM1b) - Signed by Jacobo Evalene PARAS, MD on 08/17/2023 Stage prefix: Initial diagnosis   Evalene PARAS Jacobo, MD   11/10/2023 9:50 AM

## 2023-11-11 LAB — CEA: CEA: 493 ng/mL — ABNORMAL HIGH (ref 0.0–4.7)

## 2023-11-12 ENCOUNTER — Inpatient Hospital Stay: Payer: BC Managed Care – PPO | Attending: Oncology

## 2023-11-12 DIAGNOSIS — C182 Malignant neoplasm of ascending colon: Secondary | ICD-10-CM | POA: Diagnosis not present

## 2023-11-12 DIAGNOSIS — I10 Essential (primary) hypertension: Secondary | ICD-10-CM | POA: Diagnosis not present

## 2023-11-12 DIAGNOSIS — Z87891 Personal history of nicotine dependence: Secondary | ICD-10-CM | POA: Diagnosis not present

## 2023-11-12 DIAGNOSIS — D649 Anemia, unspecified: Secondary | ICD-10-CM | POA: Insufficient documentation

## 2023-11-12 DIAGNOSIS — C7800 Secondary malignant neoplasm of unspecified lung: Secondary | ICD-10-CM | POA: Diagnosis not present

## 2023-11-12 DIAGNOSIS — Z5111 Encounter for antineoplastic chemotherapy: Secondary | ICD-10-CM | POA: Diagnosis present

## 2023-11-12 DIAGNOSIS — D72819 Decreased white blood cell count, unspecified: Secondary | ICD-10-CM | POA: Insufficient documentation

## 2023-11-12 DIAGNOSIS — G4700 Insomnia, unspecified: Secondary | ICD-10-CM | POA: Insufficient documentation

## 2023-11-12 DIAGNOSIS — C787 Secondary malignant neoplasm of liver and intrahepatic bile duct: Secondary | ICD-10-CM | POA: Insufficient documentation

## 2023-11-12 MED ORDER — HEPARIN SOD (PORK) LOCK FLUSH 100 UNIT/ML IV SOLN
500.0000 [IU] | Freq: Once | INTRAVENOUS | Status: AC | PRN
  Administered 2023-11-12: 500 [IU]
  Filled 2023-11-12: qty 5

## 2023-11-13 ENCOUNTER — Encounter: Payer: Self-pay | Admitting: Oncology

## 2023-11-15 ENCOUNTER — Other Ambulatory Visit: Payer: Self-pay

## 2023-11-17 ENCOUNTER — Ambulatory Visit: Payer: BC Managed Care – PPO | Attending: Oncology

## 2023-11-23 ENCOUNTER — Inpatient Hospital Stay (HOSPITAL_BASED_OUTPATIENT_CLINIC_OR_DEPARTMENT_OTHER): Payer: BC Managed Care – PPO | Admitting: Oncology

## 2023-11-23 ENCOUNTER — Inpatient Hospital Stay: Payer: BC Managed Care – PPO

## 2023-11-23 ENCOUNTER — Encounter: Payer: Self-pay | Admitting: Oncology

## 2023-11-23 VITALS — BP 159/82 | HR 50 | Temp 98.3°F | Resp 16 | Ht 66.0 in | Wt 170.0 lb

## 2023-11-23 DIAGNOSIS — C182 Malignant neoplasm of ascending colon: Secondary | ICD-10-CM

## 2023-11-23 DIAGNOSIS — Z5111 Encounter for antineoplastic chemotherapy: Secondary | ICD-10-CM | POA: Diagnosis not present

## 2023-11-23 LAB — CBC WITH DIFFERENTIAL (CANCER CENTER ONLY)
Abs Immature Granulocytes: 0.01 10*3/uL (ref 0.00–0.07)
Basophils Absolute: 0 10*3/uL (ref 0.0–0.1)
Basophils Relative: 0 %
Eosinophils Absolute: 0.2 10*3/uL (ref 0.0–0.5)
Eosinophils Relative: 5 %
HCT: 31.6 % — ABNORMAL LOW (ref 36.0–46.0)
Hemoglobin: 10.1 g/dL — ABNORMAL LOW (ref 12.0–15.0)
Immature Granulocytes: 0 %
Lymphocytes Relative: 26 %
Lymphs Abs: 0.9 10*3/uL (ref 0.7–4.0)
MCH: 29.4 pg (ref 26.0–34.0)
MCHC: 32 g/dL (ref 30.0–36.0)
MCV: 92.1 fL (ref 80.0–100.0)
Monocytes Absolute: 0.5 10*3/uL (ref 0.1–1.0)
Monocytes Relative: 14 %
Neutro Abs: 1.9 10*3/uL (ref 1.7–7.7)
Neutrophils Relative %: 55 %
Platelet Count: 173 10*3/uL (ref 150–400)
RBC: 3.43 MIL/uL — ABNORMAL LOW (ref 3.87–5.11)
RDW: 19.6 % — ABNORMAL HIGH (ref 11.5–15.5)
WBC Count: 3.4 10*3/uL — ABNORMAL LOW (ref 4.0–10.5)
nRBC: 0 % (ref 0.0–0.2)

## 2023-11-23 LAB — CMP (CANCER CENTER ONLY)
ALT: 19 U/L (ref 0–44)
AST: 28 U/L (ref 15–41)
Albumin: 3.5 g/dL (ref 3.5–5.0)
Alkaline Phosphatase: 64 U/L (ref 38–126)
Anion gap: 9 (ref 5–15)
BUN: 18 mg/dL (ref 6–20)
CO2: 26 mmol/L (ref 22–32)
Calcium: 8.8 mg/dL — ABNORMAL LOW (ref 8.9–10.3)
Chloride: 104 mmol/L (ref 98–111)
Creatinine: 0.66 mg/dL (ref 0.44–1.00)
GFR, Estimated: >60 mL/min (ref >60)
Glucose, Bld: 93 mg/dL (ref 70–99)
Potassium: 3.8 mmol/L (ref 3.5–5.1)
Sodium: 139 mmol/L (ref 135–145)
Total Bilirubin: 0.6 mg/dL (ref 0.0–1.2)
Total Protein: 7.2 g/dL (ref 6.5–8.1)

## 2023-11-23 LAB — TOTAL PROTEIN, URINE DIPSTICK: Protein, ur: NEGATIVE mg/dL

## 2023-11-23 MED ORDER — PALONOSETRON HCL INJECTION 0.25 MG/5ML
0.2500 mg | Freq: Once | INTRAVENOUS | Status: AC
  Administered 2023-11-23: 0.25 mg via INTRAVENOUS
  Filled 2023-11-23: qty 5

## 2023-11-23 MED ORDER — DEXAMETHASONE SODIUM PHOSPHATE 10 MG/ML IJ SOLN
10.0000 mg | Freq: Once | INTRAMUSCULAR | Status: AC
  Administered 2023-11-23: 10 mg via INTRAVENOUS
  Filled 2023-11-23: qty 1

## 2023-11-23 MED ORDER — OXALIPLATIN CHEMO INJECTION 100 MG/20ML
85.0000 mg/m2 | Freq: Once | INTRAVENOUS | Status: AC
  Administered 2023-11-23: 150 mg via INTRAVENOUS
  Filled 2023-11-23: qty 30

## 2023-11-23 MED ORDER — FLUOROURACIL CHEMO INJECTION 2.5 GM/50ML
400.0000 mg/m2 | Freq: Once | INTRAVENOUS | Status: AC
  Administered 2023-11-23: 700 mg via INTRAVENOUS
  Filled 2023-11-23: qty 14

## 2023-11-23 MED ORDER — LEUCOVORIN CALCIUM INJECTION 350 MG
400.0000 mg/m2 | Freq: Once | INTRAVENOUS | Status: AC
  Administered 2023-11-23: 716 mg via INTRAVENOUS
  Filled 2023-11-23: qty 35.8

## 2023-11-23 MED ORDER — SODIUM CHLORIDE 0.9 % IV SOLN
2400.0000 mg/m2 | INTRAVENOUS | Status: DC
  Administered 2023-11-23: 4300 mg via INTRAVENOUS
  Filled 2023-11-23: qty 86

## 2023-11-23 MED ORDER — SODIUM CHLORIDE 0.9 % IV SOLN
Freq: Once | INTRAVENOUS | Status: AC
  Filled 2023-11-23: qty 250

## 2023-11-23 MED ORDER — SODIUM CHLORIDE 0.9 % IV SOLN
5.0000 mg/kg | Freq: Once | INTRAVENOUS | Status: AC
  Administered 2023-11-23: 350 mg via INTRAVENOUS
  Filled 2023-11-23: qty 14

## 2023-11-23 MED ORDER — DEXTROSE 5 % IV SOLN
Freq: Once | INTRAVENOUS | Status: AC
  Filled 2023-11-23: qty 250

## 2023-11-23 NOTE — Progress Notes (Signed)
 [facility]  Telephone:(336) [phone] Fax:(336) [phone]  ID: Alexis Garcia OB: 1961-01-10  MR#: [id number]  RDW#:[id number]  Patient Care Team: Donal Channing SQUIBB, FNP as PCP - General (Family Medicine) Maurie Rayfield BIRCH, RN as Oncology Nurse Navigator Jacobo, Evalene PARAS, MD as Consulting Physician (Oncology)  CHIEF COMPLAINT: Stage IVB adenocarcinoma of the colon with liver and lung metastasis.  INTERVAL HISTORY: Patient returns to clinic today for further evaluation and consideration of cycle 7 of FOLFOX plus Avastin .  Patient missed her appointment for her restaging CT scan.  She currently feels well and is asymptomatic.  She does not complain of any weakness or fatigue.  She continues to tolerate her treatments well without significant side effects.  She denies any pain.  She denies any recent fevers or illnesses.  She has no neurologic complaints.  She has no chest pain, shortness of breath, cough, or hemoptysis.  She denies any nausea, vomiting, constipation, or diarrhea.  She has no melena or hematochezia.  She has no urinary complaints.  Patient has no specific complaints today.  REVIEW OF SYSTEMS:   Review of Systems  Constitutional: Negative.  Negative for fever, malaise/fatigue and weight loss.  Respiratory: Negative.  Negative for cough, hemoptysis and shortness of breath.   Cardiovascular: Negative.  Negative for chest pain and leg swelling.  Gastrointestinal: Negative.  Negative for abdominal pain, blood in stool, constipation, diarrhea, melena, nausea and vomiting.  Genitourinary: Negative.  Negative for dysuria.  Musculoskeletal: Negative.  Negative for back pain.  Neurological: Negative.  Negative for dizziness, focal weakness, weakness and headaches.  Psychiatric/Behavioral: Negative.  The patient is not nervous/anxious.     As per HPI. Otherwise, a complete review of systems is negative.  PAST MEDICAL HISTORY: Past Medical History:  Diagnosis Date    Abnormal uterine bleeding    Anemia    Anxiety    Asthma    Cardiomegaly    Cardiovascular disease    COPD (chronic obstructive pulmonary disease) (HCC)    Depression    Diabetes mellitus without complication (HCC)    Dyspnea    Goiter, nontoxic, multinodular    Heart murmur    Hypertension    Hypothyroidism    Lung cancer, primary, with metastasis from lung to other site [facility])    Malignant neoplasm of ascending colon (HCC)    Primary adenocarcinoma of ascending colon (HCC)    Syncopal episodes    Thyrotoxicosis    Uterine leiomyoma     PAST SURGICAL HISTORY: Past Surgical History:  Procedure Laterality Date   BIOPSY  08/12/2023   Procedure: BIOPSY;  Surgeon: Aundria, Ladell POUR, MD;  Location: [facility] ENDOSCOPY;  Service: Gastroenterology;;   CESAREAN SECTION     COLONOSCOPY N/A 08/12/2023   Procedure: COLONOSCOPY;  Surgeon: Toledo, Ladell POUR, MD;  Location: [facility] ENDOSCOPY;  Service: Gastroenterology;  Laterality: N/A;  3rd patient   COLONOSCOPY WITH PROPOFOL  N/A 10/29/2015   Procedure: COLONOSCOPY WITH PROPOFOL ;  Surgeon: Donnice Vaughn Manes, MD;  Location: [facility] ENDOSCOPY;  Service: Endoscopy;  Laterality: N/A;   DILITATION & CURRETTAGE/HYSTROSCOPY WITH NOVASURE ABLATION N/A 06/25/2018   Procedure: DILATATION & CURETTAGE/HYSTEROSCOPY WITH MINERVA ABLATION;  Surgeon: Connell Davies, MD;  Location: [facility] ORS;  Service: Gynecology;  Laterality: N/A;   PORTACATH PLACEMENT N/A 08/26/2023   Procedure: INSERTION PORT-A-CATH;  Surgeon: Rodolph Romano, MD;  Location: [facility] ORS;  Service: General;  Laterality: N/A;   SUBMUCOSAL TATTOO INJECTION  08/12/2023   Procedure: SUBMUCOSAL TATTOO INJECTION;  Surgeon: Moore Haven, Teodoro  K, MD;  Location: [facility] ENDOSCOPY;  Service: Gastroenterology;;   TUBAL LIGATION      FAMILY HISTORY: Family History  Adopted: Yes  Problem Relation Age of Onset   Breast cancer Neg Hx     ADVANCED DIRECTIVES (Y/N):  N  HEALTH MAINTENANCE: Social History    Tobacco Use   Smoking status: Former    Current packs/day: 0.00    Types: Cigarettes    Quit date: 2024    Years since quitting: 1.0   Smokeless tobacco: Never  Vaping Use   Vaping status: Former  Substance Use Topics   Alcohol use: Never   Drug use: Never     Colonoscopy:  PAP:  Bone density:  Lipid panel:  No Known Allergies  Current Outpatient Medications  Medication Sig Dispense Refill   albuterol  (PROVENTIL  HFA;VENTOLIN  HFA) 108 (90 Base) MCG/ACT inhaler Inhale 2 puffs into the lungs every 6 (six) hours as needed (for exercise induced asthma).     atorvastatin  (LIPITOR) 10 MG tablet SMARTSIG:1 Tablet(s) By Mouth Every Evening     ezetimibe (ZETIA) 10 MG tablet Take 10 mg by mouth daily.     hydrALAZINE  (APRESOLINE ) 10 MG tablet Take 10 mg by mouth 3 (three) times daily as needed.     levothyroxine  (SYNTHROID ) 75 MCG tablet Take 75 mcg by mouth daily.     lidocaine -prilocaine  (EMLA ) cream Apply to affected area once 30 g 3   metFORMIN (GLUCOPHAGE) 500 MG tablet Take 500 mg by mouth 2 (two) times daily.      metoprolol tartrate (LOPRESSOR) 25 MG tablet Take 25 mg by mouth 2 (two) times daily.     Multiple Vitamins-Minerals (ALIVE ONCE DAILY WOMENS PO) Take 2 tablets by mouth daily. Gummies     ondansetron  (ZOFRAN ) 8 MG tablet Take 1 tablet (8 mg total) by mouth every 8 (eight) hours as needed for nausea or vomiting. Start on the third day after chemotherapy. 60 tablet 1   prochlorperazine  (COMPAZINE ) 10 MG tablet Take 1 tablet (10 mg total) by mouth every 6 (six) hours as needed for nausea or vomiting. 60 tablet 1   No current facility-administered medications for this visit.    OBJECTIVE: Vitals:   11/23/23 0833  BP: (!) 159/82  Pulse: (!) 50  Resp: 16  Temp: 98.3 F (36.8 C)  SpO2: 100%       Body mass index is 27.44 kg/m.    ECOG FS:1 - Symptomatic but completely ambulatory  General: Well-developed, well-nourished, no acute distress. Eyes: Pink  conjunctiva, anicteric sclera. HEENT: Normocephalic, moist mucous membranes. Lungs: No audible wheezing or coughing. Heart: Regular rate and rhythm. Abdomen: Soft, nontender, no obvious distention. Musculoskeletal: No edema, cyanosis, or clubbing. Neuro: Alert, answering all questions appropriately. Cranial nerves grossly intact. Skin: No rashes or petechiae noted. Psych: Normal affect.  LAB RESULTS:  Lab Results  Component Value Date   NA 139 11/23/2023   K 3.8 11/23/2023   CL 104 11/23/2023   CO2 26 11/23/2023   GLUCOSE 93 11/23/2023   BUN 18 11/23/2023   CREATININE 0.66 11/23/2023   CALCIUM  8.8 (L) 11/23/2023   PROT 7.2 11/23/2023   ALBUMIN  3.5 11/23/2023   AST 28 11/23/2023   ALT 19 11/23/2023   ALKPHOS 64 11/23/2023   BILITOT 0.6 11/23/2023   GFRNONAA >60 11/23/2023   GFRAA >60 06/09/2018    Lab Results  Component Value Date   WBC 3.4 (L) 11/23/2023   NEUTROABS 1.9 11/23/2023   HGB 10.1 (L)  11/23/2023   HCT 31.6 (L) 11/23/2023   MCV 92.1 11/23/2023   PLT 173 11/23/2023     STUDIES: No results found.  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  Liver biopsy completed on August 06, 2023 confirmed diagnosis.  Patient's pretreatment CEA was 1780, but now has declined to 493.  Today's result is pending.  Patient underwent colonoscopy that appeared to show a complete obstruction, but patient reports she is passing stool and gas.  She also denies pain.  Proceed with cycle 7 of FOLFOX plus Avastin  today.  Return to clinic in 2 days for pump removal and then in 2 weeks for further evaluation and consideration of cycle 8.  Patient missed her restaging CT scan prior to this treatment, therefore we will reschedule for next week.   Uterine masses: Patient reports  she has a history of benign fibroids.  Appreciate gynecology input.  Monitor.   Anemia: Chronic and unchanged.  Patient's hemoglobin is 10.1 today. Leukopenia: Mild, monitor. Insomnia: Significantly improved.  Patient does not complain of this today. Coping/adjustment: Patient was previously given a referral to [facility]. Hypertension: Chronic and unchanged.  Patient blood pressure is moderately elevated today.  Proceed cautiously with Avastin .  Continue monitoring and treatment per primary care.    Patient expressed understanding and was in agreement with this plan. She also understands that She can call clinic at any time with any questions, concerns, or complaints.    Cancer Staging  Primary adenocarcinoma of ascending colon [facility]) Staging form: Colon and Rectum, AJCC 8th Edition - Clinical stage from 08/17/2023: Stage IVB (cTX, cNX, pM1b) - Signed by Jacobo Evalene PARAS, MD on 08/17/2023 Stage prefix: Initial diagnosis   Evalene PARAS Jacobo, MD   11/23/2023 9:08 AM

## 2023-11-23 NOTE — Patient Instructions (Signed)
 [facility] - A DEPT OF [facility]  Discharge Instructions: Thank you for choosing [facility] to provide your oncology and hematology care.  If you have a lab appointment with the Cancer Center, please go directly to the Cancer Center and check in at the registration area.  Wear comfortable clothing and clothing appropriate for easy access to any Portacath or PICC line.   We strive to give you quality time with your provider. You may need to reschedule your appointment if you arrive late (15 or more minutes).  Arriving late affects you and other patients whose appointments are after yours.  Also, if you miss three or more appointments without notifying the office, you may be dismissed from the clinic at the provider's discretion.      For prescription refill requests, have your pharmacy contact our office and allow 72 hours for refills to be completed.    Today you received the following chemotherapy and/or immunotherapy agents- bevacizumab , oxaliplatin , leucovorin , 5FU      To help prevent nausea and vomiting after your treatment, we encourage you to take your nausea medication as directed.  BELOW ARE SYMPTOMS THAT SHOULD BE REPORTED IMMEDIATELY: *FEVER GREATER THAN 100.4 F (38 C) OR HIGHER *CHILLS OR SWEATING *NAUSEA AND VOMITING THAT IS NOT CONTROLLED WITH YOUR NAUSEA MEDICATION *UNUSUAL SHORTNESS OF BREATH *UNUSUAL BRUISING OR BLEEDING *URINARY PROBLEMS (pain or burning when urinating, or frequent urination) *BOWEL PROBLEMS (unusual diarrhea, constipation, pain near the anus) TENDERNESS IN MOUTH AND THROAT WITH OR WITHOUT PRESENCE OF ULCERS (sore throat, sores in mouth, or a toothache) UNUSUAL RASH, SWELLING OR PAIN  UNUSUAL VAGINAL DISCHARGE OR ITCHING   Items with * indicate a potential emergency and should be followed up as soon as possible or go to the Emergency Department if any problems should occur.  Please show the  CHEMOTHERAPY ALERT CARD or IMMUNOTHERAPY ALERT CARD at check-in to the Emergency Department and triage nurse.  Should you have questions after your visit or need to cancel or reschedule your appointment, please contact [facility] - A DEPT OF [facility]  [phone] and follow the prompts.  Office hours are 8:00 a.m. to 4:30 p.m. Monday - Friday. Please note that voicemails left after 4:00 p.m. may not be returned until the following business day.  We are closed weekends and major holidays. You have access to a nurse at all times for urgent questions. Please call the main number to the clinic [phone] and follow the prompts.  For any non-urgent questions, you may also contact your provider using MyChart. We now offer e-Visits for anyone 24 and older to request care online for non-urgent symptoms. For details visit [email or web address].   Also download the MyChart app! Go to the app store, search "MyChart", open the app, select [facility], and log in with your MyChart username and password.

## 2023-11-24 LAB — CEA: CEA: 370 ng/mL — ABNORMAL HIGH (ref 0.0–4.7)

## 2023-11-25 ENCOUNTER — Inpatient Hospital Stay: Payer: BC Managed Care – PPO

## 2023-11-25 VITALS — BP 180/96 | HR 66 | Resp 18

## 2023-11-25 DIAGNOSIS — C182 Malignant neoplasm of ascending colon: Secondary | ICD-10-CM

## 2023-11-25 DIAGNOSIS — Z5111 Encounter for antineoplastic chemotherapy: Secondary | ICD-10-CM | POA: Diagnosis not present

## 2023-11-25 MED ORDER — HEPARIN SOD (PORK) LOCK FLUSH 100 UNIT/ML IV SOLN
500.0000 [IU] | Freq: Once | INTRAVENOUS | Status: AC | PRN
  Administered 2023-11-25: 500 [IU]
  Filled 2023-11-25: qty 5

## 2023-11-25 MED ORDER — SODIUM CHLORIDE 0.9% FLUSH
10.0000 mL | INTRAVENOUS | Status: DC | PRN
  Administered 2023-11-25: 10 mL
  Filled 2023-11-25: qty 10

## 2023-11-30 ENCOUNTER — Ambulatory Visit
Admission: RE | Admit: 2023-11-30 | Discharge: 2023-11-30 | Disposition: A | Discharge status: Home / Self Care | Payer: BC Managed Care – PPO | Source: Ambulatory Visit | Attending: Oncology | Admitting: Oncology

## 2023-11-30 ENCOUNTER — Encounter: Payer: Self-pay | Admitting: Oncology

## 2023-11-30 DIAGNOSIS — C182 Malignant neoplasm of ascending colon: Secondary | ICD-10-CM | POA: Insufficient documentation

## 2023-11-30 MED ORDER — IOHEXOL 300 MG/ML  SOLN
100.0000 mL | Freq: Once | INTRAMUSCULAR | Status: AC | PRN
  Administered 2023-11-30: 100 mL via INTRAVENOUS

## 2023-12-07 ENCOUNTER — Encounter: Payer: Self-pay | Admitting: Oncology

## 2023-12-07 ENCOUNTER — Inpatient Hospital Stay: Payer: BC Managed Care – PPO

## 2023-12-07 ENCOUNTER — Inpatient Hospital Stay (HOSPITAL_BASED_OUTPATIENT_CLINIC_OR_DEPARTMENT_OTHER): Payer: BC Managed Care – PPO | Admitting: Oncology

## 2023-12-07 VITALS — BP 176/88 | HR 48 | Temp 97.0°F | Resp 16 | Ht 66.0 in | Wt 170.0 lb

## 2023-12-07 VITALS — BP 184/82

## 2023-12-07 DIAGNOSIS — C182 Malignant neoplasm of ascending colon: Secondary | ICD-10-CM

## 2023-12-07 DIAGNOSIS — Z5111 Encounter for antineoplastic chemotherapy: Secondary | ICD-10-CM | POA: Diagnosis not present

## 2023-12-07 LAB — CBC WITH DIFFERENTIAL (CANCER CENTER ONLY)
Abs Immature Granulocytes: 0.01 10*3/uL (ref 0.00–0.07)
Basophils Absolute: 0 10*3/uL (ref 0.0–0.1)
Basophils Relative: 1 %
Eosinophils Absolute: 0.1 10*3/uL (ref 0.0–0.5)
Eosinophils Relative: 4 %
HCT: 33.6 % — ABNORMAL LOW (ref 36.0–46.0)
Hemoglobin: 10.8 g/dL — ABNORMAL LOW (ref 12.0–15.0)
Immature Granulocytes: 0 %
Lymphocytes Relative: 25 %
Lymphs Abs: 0.7 10*3/uL (ref 0.7–4.0)
MCH: 30 pg (ref 26.0–34.0)
MCHC: 32.1 g/dL (ref 30.0–36.0)
MCV: 93.3 fL (ref 80.0–100.0)
Monocytes Absolute: 0.4 10*3/uL (ref 0.1–1.0)
Monocytes Relative: 14 %
Neutro Abs: 1.7 10*3/uL (ref 1.7–7.7)
Neutrophils Relative %: 56 %
Platelet Count: 109 10*3/uL — ABNORMAL LOW (ref 150–400)
RBC: 3.6 MIL/uL — ABNORMAL LOW (ref 3.87–5.11)
RDW: 18.5 % — ABNORMAL HIGH (ref 11.5–15.5)
WBC Count: 3 10*3/uL — ABNORMAL LOW (ref 4.0–10.5)
nRBC: 0 % (ref 0.0–0.2)

## 2023-12-07 LAB — CMP (CANCER CENTER ONLY)
ALT: 20 U/L (ref 0–44)
AST: 32 U/L (ref 15–41)
Albumin: 3.4 g/dL — ABNORMAL LOW (ref 3.5–5.0)
Alkaline Phosphatase: 59 U/L (ref 38–126)
Anion gap: 7 (ref 5–15)
BUN: 16 mg/dL (ref 6–20)
CO2: 26 mmol/L (ref 22–32)
Calcium: 8.7 mg/dL — ABNORMAL LOW (ref 8.9–10.3)
Chloride: 105 mmol/L (ref 98–111)
Creatinine: 0.63 mg/dL (ref 0.44–1.00)
GFR, Estimated: >60 mL/min (ref >60)
Glucose, Bld: 102 mg/dL — ABNORMAL HIGH (ref 70–99)
Potassium: 3.9 mmol/L (ref 3.5–5.1)
Sodium: 138 mmol/L (ref 135–145)
Total Bilirubin: 0.8 mg/dL (ref 0.0–1.2)
Total Protein: 7.3 g/dL (ref 6.5–8.1)

## 2023-12-07 LAB — TOTAL PROTEIN, URINE DIPSTICK: Protein, ur: NEGATIVE mg/dL

## 2023-12-07 MED ORDER — DEXAMETHASONE SODIUM PHOSPHATE 10 MG/ML IJ SOLN
10.0000 mg | Freq: Once | INTRAMUSCULAR | Status: AC
  Administered 2023-12-07: 10 mg via INTRAVENOUS
  Filled 2023-12-07: qty 1

## 2023-12-07 MED ORDER — FLUOROURACIL CHEMO INJECTION 2.5 GM/50ML
400.0000 mg/m2 | Freq: Once | INTRAVENOUS | Status: AC
  Administered 2023-12-07: 700 mg via INTRAVENOUS
  Filled 2023-12-07: qty 14

## 2023-12-07 MED ORDER — PALONOSETRON HCL INJECTION 0.25 MG/5ML
0.2500 mg | Freq: Once | INTRAVENOUS | Status: AC
  Administered 2023-12-07: 0.25 mg via INTRAVENOUS
  Filled 2023-12-07: qty 5

## 2023-12-07 MED ORDER — OXALIPLATIN CHEMO INJECTION 100 MG/20ML
85.0000 mg/m2 | Freq: Once | INTRAVENOUS | Status: AC
  Administered 2023-12-07: 150 mg via INTRAVENOUS
  Filled 2023-12-07: qty 30

## 2023-12-07 MED ORDER — DEXTROSE 5 % IV SOLN
Freq: Once | INTRAVENOUS | Status: AC
  Filled 2023-12-07: qty 250

## 2023-12-07 MED ORDER — LEUCOVORIN CALCIUM INJECTION 350 MG
400.0000 mg/m2 | Freq: Once | INTRAVENOUS | Status: AC
  Administered 2023-12-07: 716 mg via INTRAVENOUS
  Filled 2023-12-07: qty 35.8

## 2023-12-07 MED ORDER — FLUOROURACIL CHEMO INJECTION 5 GM/100ML
2400.0000 mg/m2 | INTRAVENOUS | Status: DC
  Administered 2023-12-07: 4300 mg via INTRAVENOUS
  Filled 2023-12-07: qty 86

## 2023-12-07 NOTE — Progress Notes (Signed)
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  INTERVAL HISTORY: Patient returns to clinic today for further evaluation, discussion of her imaging results, and consideration of cycle 8 of FOLFOX plus Avastin.  She currently feels well and is asymptomatic.  She is tolerating her treatments without significant side effects. She does not complain of any weakness or fatigue. She denies any pain.  She denies any recent fevers or illnesses.  She has no neurologic complaints.  She has no chest pain, shortness of breath, cough, or hemoptysis.  She denies any nausea, vomiting, constipation, or diarrhea.  She has no melena or hematochezia.  She has no urinary complaints.  Patient offers no specific complaints today.  REVIEW OF SYSTEMS:   Review of Systems  Constitutional: Negative.  Negative for fever, malaise/fatigue and weight loss.  Respiratory: Negative.  Negative for cough, hemoptysis and shortness of breath.   Cardiovascular: Negative.  Negative for chest pain and leg swelling.  Gastrointestinal: Negative.  Negative for abdominal pain, blood in stool, constipation, diarrhea, melena, nausea and vomiting.  Genitourinary: Negative.  Negative for dysuria.  Musculoskeletal: Negative.  Negative for back pain.  Neurological: Negative.  Negative for dizziness, focal weakness, weakness and headaches.  Psychiatric/Behavioral: Negative.  The patient is not nervous/anxious.     As per HPI. Otherwise, a complete review of systems is negative.  PAST MEDICAL HISTORY: Past Medical History:  Diagnosis Date   Abnormal uterine bleeding     Anemia    Anxiety    Asthma    Cardiomegaly    Cardiovascular disease    COPD (chronic obstructive pulmonary disease) (HCC)    Depression    Diabetes mellitus without complication (HCC)    Dyspnea    Goiter, nontoxic, multinodular    Heart murmur    Hypertension    Hypothyroidism    Lung cancer, primary, with metastasis from lung to other site [facility])    Malignant neoplasm of ascending colon (HCC)    Primary adenocarcinoma of ascending colon (HCC)    Syncopal episodes    Thyrotoxicosis    Uterine leiomyoma     PAST SURGICAL HISTORY: Past Surgical History:  Procedure Laterality Date   BIOPSY  08/12/2023   Procedure: BIOPSY;  Surgeon: Norma Fredrickson, Boykin Nearing, MD;  Location: [facility] ENDOSCOPY;  Service: Gastroenterology;;   CESAREAN SECTION     COLONOSCOPY N/A 08/12/2023   Procedure: COLONOSCOPY;  Surgeon: Toledo, Boykin Nearing, MD;  Location: [facility] ENDOSCOPY;  Service: Gastroenterology;  Laterality: N/A;  3rd patient   COLONOSCOPY WITH PROPOFOL N/A 10/29/2015   Procedure: COLONOSCOPY WITH PROPOFOL;  Surgeon: Elnita Maxwell, MD;  Location: [facility] ENDOSCOPY;  Service: Endoscopy;  Laterality: N/A;   DILITATION & CURRETTAGE/HYSTROSCOPY WITH NOVASURE ABLATION N/A 06/25/2018   Procedure: DILATATION & CURETTAGE/HYSTEROSCOPY WITH MINERVA ABLATION;  Surgeon: Hildred Laser, MD;  Location: [facility] ORS;  Service: Gynecology;  Laterality: N/A;   PORTACATH PLACEMENT N/A 08/26/2023   Procedure: INSERTION PORT-A-CATH;  Surgeon: Carolan Shiver, MD;  Location: [facility] ORS;  Service: General;  Laterality: N/A;   SUBMUCOSAL TATTOO INJECTION  08/12/2023   Procedure: SUBMUCOSAL TATTOO INJECTION;  Surgeon: Toledo, Boykin Nearing, MD;  Location: [facility] ENDOSCOPY;  Service: Gastroenterology;;  TUBAL LIGATION      FAMILY HISTORY: Family History  Adopted: Yes  Problem Relation Age of Onset   Breast cancer Neg Hx     ADVANCED DIRECTIVES (Y/N):  N  HEALTH MAINTENANCE: Social History   Tobacco Use   Smoking  status: Former    Current packs/day: 0.00    Types: Cigarettes    Quit date: 2024    Years since quitting: 1.0   Smokeless tobacco: Never  Vaping Use   Vaping status: Former  Substance Use Topics   Alcohol use: Never   Drug use: Never     Colonoscopy:  PAP:  Bone density:  Lipid panel:  No Known Allergies  Current Outpatient Medications  Medication Sig Dispense Refill   albuterol (PROVENTIL HFA;VENTOLIN HFA) 108 (90 Base) MCG/ACT inhaler Inhale 2 puffs into the lungs every 6 (six) hours as needed (for exercise induced asthma).     atorvastatin (LIPITOR) 10 MG tablet SMARTSIG:1 Tablet(s) By Mouth Every Evening     ezetimibe (ZETIA) 10 MG tablet Take 10 mg by mouth daily.     hydrALAZINE (APRESOLINE) 10 MG tablet Take 10 mg by mouth 3 (three) times daily as needed.     levothyroxine (SYNTHROID) 75 MCG tablet Take 75 mcg by mouth daily.     lidocaine-prilocaine (EMLA) cream Apply to affected area once 30 g 3   metFORMIN (GLUCOPHAGE) 500 MG tablet Take 500 mg by mouth 2 (two) times daily.      metoprolol tartrate (LOPRESSOR) 25 MG tablet Take 25 mg by mouth 2 (two) times daily.     Multiple Vitamins-Minerals (ALIVE ONCE DAILY WOMENS PO) Take 2 tablets by mouth daily. Gummies     ondansetron (ZOFRAN) 8 MG tablet Take 1 tablet (8 mg total) by mouth every 8 (eight) hours as needed for nausea or vomiting. Start on the third day after chemotherapy. 60 tablet 1   prochlorperazine (COMPAZINE) 10 MG tablet Take 1 tablet (10 mg total) by mouth every 6 (six) hours as needed for nausea or vomiting. 60 tablet 1   No current facility-administered medications for this visit.    OBJECTIVE: Vitals:   12/07/23 0838  BP: (!) 176/88  Pulse: (!) 48  Resp: 16  Temp: (!) 97 F (36.1 C)  SpO2: 100%       Body mass index is 27.44 kg/m.    ECOG FS:0 - Asymptomatic  General: Well-developed, well-nourished, no acute distress. Eyes: Pink conjunctiva, anicteric sclera. HEENT: Normocephalic, moist  mucous membranes. Lungs: No audible wheezing or coughing. Heart: Regular rate and rhythm. Abdomen: Soft, nontender, no obvious distention. Musculoskeletal: No edema, cyanosis, or clubbing. Neuro: Alert, answering all questions appropriately. Cranial nerves grossly intact. Skin: No rashes or petechiae noted. Psych: Normal affect.  LAB RESULTS:  Lab Results  Component Value Date   NA 138 12/07/2023   K 3.9 12/07/2023   CL 105 12/07/2023   CO2 26 12/07/2023   GLUCOSE 102 (H) 12/07/2023   BUN 16 12/07/2023   CREATININE 0.63 12/07/2023   CALCIUM 8.7 (L) 12/07/2023   PROT 7.3 12/07/2023   ALBUMIN 3.4 (L) 12/07/2023   AST 32 12/07/2023   ALT 20 12/07/2023   ALKPHOS 59 12/07/2023   BILITOT 0.8 12/07/2023   GFRNONAA >60 12/07/2023   GFRAA >60 06/09/2018    Lab Results  Component Value Date   WBC 3.0 (L) 12/07/2023   NEUTROABS 1.7 12/07/2023   HGB 10.8 (L) 12/07/2023   HCT 33.6 (L) 12/07/2023   MCV 93.3  12/07/2023   PLT 109 (L) 12/07/2023     STUDIES: CT CHEST ABDOMEN PELVIS W CONTRAST Result Date: 12/02/2023 CLINICAL DATA:  Metastatic colon cancer restaging * Tracking Code: BO * EXAM: CT CHEST, ABDOMEN, AND PELVIS WITH CONTRAST TECHNIQUE: Multidetector CT imaging of the chest, abdomen and pelvis was performed following the standard protocol during bolus administration of intravenous contrast. RADIATION DOSE REDUCTION: This exam was performed according to the departmental dose-optimization program which includes automated exposure control, adjustment of the mA and/or kV according to patient size and/or use of iterative reconstruction technique. CONTRAST:  OMNIPAQUE IOHEXOL 300 MG/ML SOLN additional oral enteric contrast COMPARISON:  CT chest angiogram, 08/04/2023, CT abdomen pelvis, 08/05/2023 FINDINGS: CT CHEST FINDINGS Cardiovascular: Right chest port catheter. Aortic atherosclerosis. Normal heart size. No pericardial effusion. Mediastinum/Nodes: No enlarged mediastinal,  hilar, or axillary lymph nodes. Thyroid gland, trachea, and esophagus demonstrate no significant findings. Lungs/Pleura: Diminished size of multiple bilateral pulmonary nodules, index nodule of the superior segment left lower lobe measuring 0.8 x 0.7 cm, previously 1.4 x 1.1 cm (series 4, image 63), index nodule of the dependent left lower lobe measuring 1.2 x 1.2 cm, previously 1.7 x 1.4 cm (series 4, image 101). No pleural effusion or pneumothorax. Musculoskeletal: No chest wall abnormality. No acute osseous findings. CT ABDOMEN PELVIS FINDINGS Hepatobiliary: Diminished size of multiple hypoenhancing liver metastases, some with interval calcification. Large index lesion centered in the caudate measures 4.8 x 3.5 cm, previously 6.6 x 4.0 cm (series 2, image 65). Additional lesion in the peripheral inferior right lobe of the liver, hepatic segment VI measures 3.3 x 2.7 cm, previously 4.6 x 3.7 cm (series 2, image 61). No gallstones, gallbladder wall thickening, or biliary dilatation. Pancreas: Unremarkable. No pancreatic ductal dilatation or surrounding inflammatory changes. Spleen: Normal in size without significant abnormality. Adrenals/Urinary Tract: Adrenal glands are unremarkable. Kidneys are normal, without renal calculi, solid lesion, or hydronephrosis. Bladder is unremarkable. Stomach/Bowel: Stomach is within normal limits. Diminished size of a mass arising from the cecum, measuring 5.7 x 4.9 cm, previously 6.6 x 5.0 cm (series 2, image 67). Patulous distal ileum likely partially obstipated by mass (series 2, image 76). Vascular/Lymphatic: Scattered aortic atherosclerosis. Diminished size of numerous enlarged mesenteric and retroperitoneal lymph nodes, index left retroperitoneal node measuring 1.2 x 0.9 cm, previously 1.6 x 1.2 cm (series 2, image 82), index mesenteric node measuring 1.0 x 0.9 cm, previously 1.7 x 1.2 cm (series 2, image 82). Reproductive: Uterine fibroids. 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 Other: Unchanged fat containing midline ventral hernia (series 2, image 76). No ascites. Musculoskeletal: No acute osseous findings. IMPRESSION: 1. Diminished size of cecal mass. 2. Diminished size of multiple bilateral pulmonary metastases. 3. Diminished size of multiple liver metastases. 4. Diminished size of numerous enlarged mesenteric and retroperitoneal lymph nodes. 5. 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 6. Constellation of findings consistent with treatment response of primary colon malignancy and widespread metastatic disease. Aortic Atherosclerosis (ICD10-I70.0). Electronically Signed   By: Jearld Lesch M.D.   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  Liver biopsy completed on August 06, 2023 confirmed diagnosis.  Patient's pretreatment CEA was 1780, but now has declined to 370.  Today's result is pending.  Patient underwent colonoscopy that appeared to show a complete obstruction, but patient reports she is passing stool and gas.  She also denies pain.  Repeat CT scan on November 30, 2023 reviewed independently and reported as above with significant improvement of patient's disease burden.  Proceed with cycle 8 of FOLFOX plus Avastin today.  Return to clinic in 2 days for pump removal and then in 2 weeks for further evaluation and consideration of cycle 9.    Uterine masses: Patient reports she has a history of benign fibroids.  Appreciate gynecology input.  Monitor.   Anemia: Hemoglobin is trended up to 10.8.  Monitor.. Leukopenia: Chronic and unchanged.   Insomnia:  Significantly improved.  Patient does not complain of this today. Coping/adjustment: Patient was previously given a referral to [facility]. Hypertension: Chronic and unchanged.  Patient's blood pressure remains persistently elevated.  Proceed cautiously with Avastin.  Continue monitoring and treatment per primary care.    Patient expressed understanding and was in agreement with this plan. She also understands that She can call clinic at any time with any questions, concerns, or complaints.    Cancer Staging  Primary adenocarcinoma of ascending colon [facility]) Staging form: Colon and Rectum, AJCC 8th Edition - Clinical stage from 08/17/2023: Stage IVB (cTX, cNX, pM1b) - Signed by Jeralyn Ruths, MD on 08/17/2023 Stage prefix: Initial diagnosis   Jeralyn Ruths, MD   12/07/2023 9:05 AM

## 2023-12-07 NOTE — Progress Notes (Signed)
Only concern is when she blows her nose she has blood in the mucus.

## 2023-12-07 NOTE — Patient Instructions (Signed)
[facility]  Discharge Instructions: Thank you for choosing [facility] to provide your oncology and hematology care.  If you have a lab appointment with the Cancer Center, please go directly to the Cancer Center and check in at the registration area.  Wear comfortable clothing and clothing appropriate for easy access to any Portacath or PICC line.   We strive to give you quality time with your provider. You may need to reschedule your appointment if you arrive late (15 or more minutes).  Arriving late affects you and other patients whose appointments are after yours.  Also, if you miss three or more appointments without notifying the office, you may be dismissed from the clinic at the provider's discretion.      For prescription refill requests, have your pharmacy contact our office and allow 72 hours for refills to be completed.    Today you received the following chemotherapy and/or immunotherapy agents Oxaliplatin, Leucovorin, Adrucil      To help prevent nausea and vomiting after your treatment, we encourage you to take your nausea medication as directed.  BELOW ARE SYMPTOMS THAT SHOULD BE REPORTED IMMEDIATELY: *FEVER GREATER THAN 100.4 F (38 C) OR HIGHER *CHILLS OR SWEATING *NAUSEA AND VOMITING THAT IS NOT CONTROLLED WITH YOUR NAUSEA MEDICATION *UNUSUAL SHORTNESS OF BREATH *UNUSUAL BRUISING OR BLEEDING *URINARY PROBLEMS (pain or burning when urinating, or frequent urination) *BOWEL PROBLEMS (unusual diarrhea, constipation, pain near the anus) TENDERNESS IN MOUTH AND THROAT WITH OR WITHOUT PRESENCE OF ULCERS (sore throat, sores in mouth, or a toothache) UNUSUAL RASH, SWELLING OR PAIN  UNUSUAL VAGINAL DISCHARGE OR ITCHING   Items with * indicate a potential emergency and should be followed up as soon as possible or go to the Emergency Department if any problems should occur.  Please show the CHEMOTHERAPY ALERT  CARD or IMMUNOTHERAPY ALERT CARD at check-in to the Emergency Department and triage nurse.  Should you have questions after your visit or need to cancel or reschedule your appointment, please contact [facility] - A DEPT OF [facility]  [phone] and follow the prompts.  Office hours are 8:00 a.m. to 4:30 p.m. Monday - Friday. Please note that voicemails left after 4:00 p.m. may not be returned until the following business day.  We are closed weekends and major holidays. You have access to a nurse at all times for urgent questions. Please call the main number to the clinic [phone] and follow the prompts.  For any non-urgent questions, you may also contact your provider using MyChart. We now offer e-Visits for anyone 24 and older to request care online for non-urgent symptoms. For details visit [email or web address].   Also download the MyChart app! Go to the app store, search "MyChart", open the app, select [name], and log in with your MyChart username and password.

## 2023-12-08 LAB — CEA: CEA: 335 ng/mL — ABNORMAL HIGH (ref 0.0–4.7)

## 2023-12-09 ENCOUNTER — Inpatient Hospital Stay: Payer: BC Managed Care – PPO

## 2023-12-09 VITALS — BP 152/68 | HR 57 | Resp 18

## 2023-12-09 DIAGNOSIS — C182 Malignant neoplasm of ascending colon: Secondary | ICD-10-CM

## 2023-12-09 DIAGNOSIS — Z5111 Encounter for antineoplastic chemotherapy: Secondary | ICD-10-CM | POA: Diagnosis not present

## 2023-12-09 MED ORDER — HEPARIN SOD (PORK) LOCK FLUSH 100 UNIT/ML IV SOLN
500.0000 [IU] | Freq: Once | INTRAVENOUS | Status: AC | PRN
  Administered 2023-12-09: 500 [IU]
  Filled 2023-12-09: qty 5

## 2023-12-09 MED ORDER — SODIUM CHLORIDE 0.9% FLUSH
10.0000 mL | INTRAVENOUS | Status: DC | PRN
  Administered 2023-12-09: 10 mL
  Filled 2023-12-09: qty 10

## 2023-12-09 NOTE — Progress Notes (Signed)
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  Patient receiving folfox and avastin.   Met with patient following pump removal.  Patient reports that appetite has been good.  Eating [place] bacon, 2 eggs, spinach, fruit and toast or bagel for breakfast with coffee.  Lunch maybe a shake with fruit.  Supper is salad, chicken or impossible burger and vegetable.  Has reduced fried foods in diet.  Reports broccoli and beans cause gas.  Denies nausea.  Reports regular bowel movement.     Medications: reviewed  Labs: reviewed  Anthropometrics:   Weight 170 lb  166 lb on 12/19 159 lb on 11/4  UBW of 240 lb over 1 year ago   NUTRITION DIAGNOSIS: Unintentional weight loss improved   INTERVENTION:  Continue well balanced diet, including lean protein foods and plant foods as tolerated Patient has contact information and will call if needed    NEXT VISIT: no follow-up RD available as needed  Margaretta Chittum B. Freida Busman, RD, LDN Registered Dietitian [phone]

## 2023-12-15 ENCOUNTER — Other Ambulatory Visit: Payer: Self-pay

## 2023-12-16 ENCOUNTER — Other Ambulatory Visit: Payer: Self-pay

## 2023-12-21 ENCOUNTER — Inpatient Hospital Stay: Payer: BC Managed Care – PPO

## 2023-12-21 ENCOUNTER — Encounter: Payer: Self-pay | Admitting: Oncology

## 2023-12-21 ENCOUNTER — Inpatient Hospital Stay: Payer: BC Managed Care – PPO | Attending: Oncology

## 2023-12-21 ENCOUNTER — Inpatient Hospital Stay (HOSPITAL_BASED_OUTPATIENT_CLINIC_OR_DEPARTMENT_OTHER): Payer: BC Managed Care – PPO | Admitting: Oncology

## 2023-12-21 VITALS — BP 182/91 | HR 57

## 2023-12-21 VITALS — BP 194/71 | HR 50 | Temp 97.6°F | Resp 20 | Wt 170.7 lb

## 2023-12-21 DIAGNOSIS — Z5111 Encounter for antineoplastic chemotherapy: Secondary | ICD-10-CM | POA: Diagnosis present

## 2023-12-21 DIAGNOSIS — G4700 Insomnia, unspecified: Secondary | ICD-10-CM | POA: Insufficient documentation

## 2023-12-21 DIAGNOSIS — Z87891 Personal history of nicotine dependence: Secondary | ICD-10-CM | POA: Diagnosis not present

## 2023-12-21 DIAGNOSIS — C182 Malignant neoplasm of ascending colon: Secondary | ICD-10-CM | POA: Insufficient documentation

## 2023-12-21 DIAGNOSIS — N858 Other specified noninflammatory disorders of uterus: Secondary | ICD-10-CM | POA: Diagnosis not present

## 2023-12-21 DIAGNOSIS — I10 Essential (primary) hypertension: Secondary | ICD-10-CM | POA: Diagnosis not present

## 2023-12-21 DIAGNOSIS — C7802 Secondary malignant neoplasm of left lung: Secondary | ICD-10-CM | POA: Insufficient documentation

## 2023-12-21 DIAGNOSIS — C787 Secondary malignant neoplasm of liver and intrahepatic bile duct: Secondary | ICD-10-CM | POA: Insufficient documentation

## 2023-12-21 DIAGNOSIS — D696 Thrombocytopenia, unspecified: Secondary | ICD-10-CM | POA: Insufficient documentation

## 2023-12-21 DIAGNOSIS — D649 Anemia, unspecified: Secondary | ICD-10-CM | POA: Diagnosis not present

## 2023-12-21 DIAGNOSIS — C7801 Secondary malignant neoplasm of right lung: Secondary | ICD-10-CM | POA: Diagnosis not present

## 2023-12-21 DIAGNOSIS — D72819 Decreased white blood cell count, unspecified: Secondary | ICD-10-CM | POA: Diagnosis not present

## 2023-12-21 LAB — CBC WITH DIFFERENTIAL (CANCER CENTER ONLY)
Abs Immature Granulocytes: 0.01 10*3/uL (ref 0.00–0.07)
Basophils Absolute: 0 10*3/uL (ref 0.0–0.1)
Basophils Relative: 0 %
Eosinophils Absolute: 0.2 10*3/uL (ref 0.0–0.5)
Eosinophils Relative: 6 %
HCT: 33.7 % — ABNORMAL LOW (ref 36.0–46.0)
Hemoglobin: 10.8 g/dL — ABNORMAL LOW (ref 12.0–15.0)
Immature Granulocytes: 0 %
Lymphocytes Relative: 32 %
Lymphs Abs: 1 10*3/uL (ref 0.7–4.0)
MCH: 30.2 pg (ref 26.0–34.0)
MCHC: 32 g/dL (ref 30.0–36.0)
MCV: 94.1 fL (ref 80.0–100.0)
Monocytes Absolute: 0.5 10*3/uL (ref 0.1–1.0)
Monocytes Relative: 16 %
Neutro Abs: 1.4 10*3/uL — ABNORMAL LOW (ref 1.7–7.7)
Neutrophils Relative %: 46 %
Platelet Count: 127 10*3/uL — ABNORMAL LOW (ref 150–400)
RBC: 3.58 MIL/uL — ABNORMAL LOW (ref 3.87–5.11)
RDW: 17.4 % — ABNORMAL HIGH (ref 11.5–15.5)
WBC Count: 3.1 10*3/uL — ABNORMAL LOW (ref 4.0–10.5)
nRBC: 0 % (ref 0.0–0.2)

## 2023-12-21 LAB — CMP (CANCER CENTER ONLY)
ALT: 25 U/L (ref 0–44)
AST: 35 U/L (ref 15–41)
Albumin: 3.5 g/dL (ref 3.5–5.0)
Alkaline Phosphatase: 67 U/L (ref 38–126)
Anion gap: 9 (ref 5–15)
BUN: 21 mg/dL — ABNORMAL HIGH (ref 6–20)
CO2: 26 mmol/L (ref 22–32)
Calcium: 8.9 mg/dL (ref 8.9–10.3)
Chloride: 102 mmol/L (ref 98–111)
Creatinine: 0.47 mg/dL (ref 0.44–1.00)
GFR, Estimated: >60 mL/min (ref >60)
Glucose, Bld: 94 mg/dL (ref 70–99)
Potassium: 4 mmol/L (ref 3.5–5.1)
Sodium: 137 mmol/L (ref 135–145)
Total Bilirubin: 0.7 mg/dL (ref 0.0–1.2)
Total Protein: 7.7 g/dL (ref 6.5–8.1)

## 2023-12-21 LAB — TOTAL PROTEIN, URINE DIPSTICK: Protein, ur: NEGATIVE mg/dL

## 2023-12-21 MED ORDER — PALONOSETRON HCL INJECTION 0.25 MG/5ML
0.2500 mg | Freq: Once | INTRAVENOUS | Status: AC
  Administered 2023-12-21: 0.25 mg via INTRAVENOUS
  Filled 2023-12-21: qty 5

## 2023-12-21 MED ORDER — DEXAMETHASONE SODIUM PHOSPHATE 10 MG/ML IJ SOLN
10.0000 mg | Freq: Once | INTRAMUSCULAR | Status: AC
  Administered 2023-12-21: 10 mg via INTRAVENOUS
  Filled 2023-12-21: qty 1

## 2023-12-21 MED ORDER — FLUOROURACIL CHEMO INJECTION 2.5 GM/50ML
400.0000 mg/m2 | Freq: Once | INTRAVENOUS | Status: AC
  Administered 2023-12-21: 700 mg via INTRAVENOUS
  Filled 2023-12-21: qty 14

## 2023-12-21 MED ORDER — LEUCOVORIN CALCIUM INJECTION 350 MG
400.0000 mg/m2 | Freq: Once | INTRAVENOUS | Status: AC
  Administered 2023-12-21: 716 mg via INTRAVENOUS
  Filled 2023-12-21: qty 35.8

## 2023-12-21 MED ORDER — SODIUM CHLORIDE 0.9 % IV SOLN
Freq: Once | INTRAVENOUS | Status: DC
  Filled 2023-12-21: qty 250

## 2023-12-21 MED ORDER — OXALIPLATIN CHEMO INJECTION 100 MG/20ML
85.0000 mg/m2 | Freq: Once | INTRAVENOUS | Status: AC
  Administered 2023-12-21: 150 mg via INTRAVENOUS
  Filled 2023-12-21: qty 30

## 2023-12-21 MED ORDER — SODIUM CHLORIDE 0.9 % IV SOLN: 5.0000 mg/kg | Freq: Once | INTRAVENOUS | Status: DC

## 2023-12-21 MED ORDER — SODIUM CHLORIDE 0.9 % IV SOLN
2400.0000 mg/m2 | INTRAVENOUS | Status: DC
  Administered 2023-12-21: 4300 mg via INTRAVENOUS
  Filled 2023-12-21: qty 86

## 2023-12-21 MED ORDER — DEXTROSE 5 % IV SOLN
Freq: Once | INTRAVENOUS | Status: AC
  Filled 2023-12-21: qty 250

## 2023-12-21 NOTE — Progress Notes (Signed)
 Per Dr. Adrian Alba,  Hold Mvasi  due to elevated BP. OK to proceed with FOLFOX   Nakai Yard E Gissel Keilman, PharmD, BCPS Clinical Pharmacist

## 2023-12-21 NOTE — Patient Instructions (Signed)
 [facility]  Discharge Instructions: Thank you for choosing [facility] to provide your oncology and hematology care.  If you have a lab appointment with the Cancer Center, please go directly to the Cancer Center and check in at the registration area.  Wear comfortable clothing and clothing appropriate for easy access to any Portacath or PICC line.   We strive to give you quality time with your provider. You may need to reschedule your appointment if you arrive late (15 or more minutes).  Arriving late affects you and other patients whose appointments are after yours.  Also, if you miss three or more appointments without notifying the office, you may be dismissed from the clinic at the provider's discretion.      For prescription refill requests, have your pharmacy contact our office and allow 72 hours for refills to be completed.    Today you received the following chemotherapy and/or immunotherapy agents Oxaliplatin, Leucovorin and Adrucil       To help prevent nausea and vomiting after your treatment, we encourage you to take your nausea medication as directed.  BELOW ARE SYMPTOMS THAT SHOULD BE REPORTED IMMEDIATELY: *FEVER GREATER THAN 100.4 F (38 C) OR HIGHER *CHILLS OR SWEATING *NAUSEA AND VOMITING THAT IS NOT CONTROLLED WITH YOUR NAUSEA MEDICATION *UNUSUAL SHORTNESS OF BREATH *UNUSUAL BRUISING OR BLEEDING *URINARY PROBLEMS (pain or burning when urinating, or frequent urination) *BOWEL PROBLEMS (unusual diarrhea, constipation, pain near the anus) TENDERNESS IN MOUTH AND THROAT WITH OR WITHOUT PRESENCE OF ULCERS (sore throat, sores in mouth, or a toothache) UNUSUAL RASH, SWELLING OR PAIN  UNUSUAL VAGINAL DISCHARGE OR ITCHING   Items with * indicate a potential emergency and should be followed up as soon as possible or go to the Emergency Department if any problems should occur.  Please show the CHEMOTHERAPY  ALERT CARD or IMMUNOTHERAPY ALERT CARD at check-in to the Emergency Department and triage nurse.  Should you have questions after your visit or need to cancel or reschedule your appointment, please contact [facility] - A DEPT OF [facility]  [phone] and follow the prompts.  Office hours are 8:00 a.m. to 4:30 p.m. Monday - Friday. Please note that voicemails left after 4:00 p.m. may not be returned until the following business day.  We are closed weekends and major holidays. You have access to a nurse at all times for urgent questions. Please call the main number to the clinic [phone] and follow the prompts.  For any non-urgent questions, you may also contact your provider using MyChart. We now offer e-Visits for anyone 50 and older to request care online for non-urgent symptoms. For details visit [email or web address].   Also download the MyChart app! Go to the app store, search "MyChart", open the app, select [place], and log in with your MyChart username and password.

## 2023-12-21 NOTE — Progress Notes (Signed)
 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  INTERVAL HISTORY: Patient returns to clinic today for further evaluation and consideration of cycle 9 of FOLFOX plus Avastin .  She currently feels well and is asymptomatic.  She continues to tolerate her treatments well without significant side effects.  She does not complain of any weakness or fatigue. She denies any pain.  She denies any recent fevers or illnesses.  She has no neurologic complaints.  She has no chest pain, shortness of breath, cough, or hemoptysis.  She denies any nausea, vomiting, constipation, or diarrhea.  She has no melena or hematochezia.  She has no urinary complaints.  Patient offers no specific complaints today.  REVIEW OF SYSTEMS:   Review of Systems  Constitutional: Negative.  Negative for fever, malaise/fatigue and weight loss.  Respiratory: Negative.  Negative for cough, hemoptysis and shortness of breath.   Cardiovascular: Negative.  Negative for chest pain and leg swelling.  Gastrointestinal: Negative.  Negative for abdominal pain, blood in stool, constipation, diarrhea, melena, nausea and vomiting.  Genitourinary: Negative.  Negative for dysuria.  Musculoskeletal: Negative.  Negative for back pain.  Neurological: Negative.  Negative for dizziness, focal weakness, weakness and headaches.  Psychiatric/Behavioral: Negative.  The patient is not nervous/anxious.     As per HPI. Otherwise, a complete review of systems is negative.  PAST MEDICAL HISTORY: Past Medical History:  Diagnosis Date   Abnormal uterine bleeding    Anemia    Anxiety     Asthma    Cardiomegaly    Cardiovascular disease    COPD (chronic obstructive pulmonary disease) (HCC)    Depression    Diabetes mellitus without complication (HCC)    Dyspnea    Goiter, nontoxic, multinodular    Heart murmur    Hypertension    Hypothyroidism    Lung cancer, primary, with metastasis from lung to other site [facility])    Malignant neoplasm of ascending colon (HCC)    Primary adenocarcinoma of ascending colon (HCC)    Syncopal episodes    Thyrotoxicosis    Uterine leiomyoma     PAST SURGICAL HISTORY: Past Surgical History:  Procedure Laterality Date   BIOPSY  08/12/2023   Procedure: BIOPSY;  Surgeon: Corky Diener, Alphonsus Jeans, MD;  Location: [facility] ENDOSCOPY;  Service: Gastroenterology;;   CESAREAN SECTION     COLONOSCOPY N/A 08/12/2023   Procedure: COLONOSCOPY;  Surgeon: Toledo, Alphonsus Jeans, MD;  Location: [facility] ENDOSCOPY;  Service: Gastroenterology;  Laterality: N/A;  3rd patient   COLONOSCOPY WITH PROPOFOL  N/A 10/29/2015   Procedure: COLONOSCOPY WITH PROPOFOL ;  Surgeon: Luella Sager, MD;  Location: [facility] ENDOSCOPY;  Service: Endoscopy;  Laterality: N/A;   DILITATION & CURRETTAGE/HYSTROSCOPY WITH NOVASURE ABLATION N/A 06/25/2018   Procedure: DILATATION & CURETTAGE/HYSTEROSCOPY WITH MINERVA ABLATION;  Surgeon: Teresa Fender, MD;  Location: [facility] ORS;  Service: Gynecology;  Laterality: N/A;   PORTACATH PLACEMENT N/A 08/26/2023   Procedure: INSERTION PORT-A-CATH;  Surgeon: Eldred Grego, MD;  Location: [facility] ORS;  Service: General;  Laterality: N/A;   SUBMUCOSAL TATTOO INJECTION  08/12/2023   Procedure: SUBMUCOSAL TATTOO INJECTION;  Surgeon: Toledo, Teodoro K, MD;  Location: [facility] ENDOSCOPY;  Service: Gastroenterology;;  TUBAL LIGATION      FAMILY HISTORY: Family History  Adopted: Yes  Problem Relation Age of Onset   Breast cancer Neg Hx     ADVANCED DIRECTIVES (Y/N):  N  HEALTH MAINTENANCE: Social History   Tobacco Use   Smoking status: Former    Current  packs/day: 0.00    Types: Cigarettes    Quit date: 2024    Years since quitting: 1.1   Smokeless tobacco: Never  Vaping Use   Vaping status: Former  Substance Use Topics   Alcohol use: Never   Drug use: Never     Colonoscopy:  PAP:  Bone density:  Lipid panel:  No Known Allergies  Current Outpatient Medications  Medication Sig Dispense Refill   albuterol  (PROVENTIL  HFA;VENTOLIN  HFA) 108 (90 Base) MCG/ACT inhaler Inhale 2 puffs into the lungs every 6 (six) hours as needed (for exercise induced asthma).     atorvastatin  (LIPITOR) 10 MG tablet SMARTSIG:1 Tablet(s) By Mouth Every Evening     ezetimibe (ZETIA) 10 MG tablet Take 10 mg by mouth daily.     hydrALAZINE  (APRESOLINE ) 10 MG tablet Take 10 mg by mouth 3 (three) times daily as needed.     levothyroxine  (SYNTHROID ) 75 MCG tablet Take 75 mcg by mouth daily.     lidocaine -prilocaine  (EMLA ) cream Apply to affected area once 30 g 3   metFORMIN (GLUCOPHAGE) 500 MG tablet Take 500 mg by mouth 2 (two) times daily.      metoprolol tartrate (LOPRESSOR) 25 MG tablet Take 25 mg by mouth 2 (two) times daily.     Multiple Vitamins-Minerals (ALIVE ONCE DAILY WOMENS PO) Take 2 tablets by mouth daily. Gummies     ondansetron  (ZOFRAN ) 8 MG tablet Take 1 tablet (8 mg total) by mouth every 8 (eight) hours as needed for nausea or vomiting. Start on the third day after chemotherapy. 60 tablet 1   prochlorperazine  (COMPAZINE ) 10 MG tablet Take 1 tablet (10 mg total) by mouth every 6 (six) hours as needed for nausea or vomiting. 60 tablet 1   No current facility-administered medications for this visit.   Facility-Administered Medications Ordered in Other Visits  Medication Dose Route Frequency Provider Last Rate Last Admin   0.9 %  sodium chloride  infusion   Intravenous Once Adrian Alba, Ariell Gunnels J, MD       bevacizumab -awwb (MVASI ) 350 mg in sodium chloride  0.9 % 100 mL chemo infusion  5 mg/kg (Treatment Plan Recorded) Intravenous Once Darolyn Double,  Rayman Petrosian J, MD       dexamethasone  (DECADRON ) injection 10 mg  10 mg Intravenous Once Ephram Kornegay J, MD       dextrose  5 % solution   Intravenous Once Kiron Osmun J, MD       fluorouracil  (ADRUCIL ) 4,300 mg in sodium chloride  0.9 % 64 mL chemo infusion  2,400 mg/m2 (Treatment Plan Recorded) Intravenous 1 day or 1 dose Ronen Bromwell J, MD       fluorouracil  (ADRUCIL ) chemo injection 700 mg  400 mg/m2 (Treatment Plan Recorded) Intravenous Once Adabella Stanis J, MD       leucovorin  716 mg in dextrose  5 % 250 mL infusion  400 mg/m2 (Treatment Plan Recorded) Intravenous Once Sadee Osland J, MD       oxaliplatin  (ELOXATIN ) 150 mg in dextrose  5 % 500 mL chemo infusion  85 mg/m2 (Treatment Plan Recorded) Intravenous Once Decker Cogdell J, MD       palonosetron  (ALOXI ) injection 0.25 mg  0.25 mg Intravenous Once Linnaea Ahn,  Deadra Everts, MD       sodium chloride  flush (NS) 0.9 % injection 10 mL  10 mL Intracatheter PRN Shellie Dials, MD   10 mL at 12/09/23 1322    OBJECTIVE: Vitals:   12/21/23 0837  BP: (!) 194/71  Pulse: (!) 50  Resp: 20  Temp: 97.6 F (36.4 C)  SpO2: 100%       Body mass index is 27.55 kg/m.    ECOG FS:0 - Asymptomatic  General: Well-developed, well-nourished, no acute distress. Eyes: Pink conjunctiva, anicteric sclera. HEENT: Normocephalic, moist mucous membranes. Lungs: No audible wheezing or coughing. Heart: Regular rate and rhythm. Abdomen: Soft, nontender, no obvious distention. Musculoskeletal: No edema, cyanosis, or clubbing. Neuro: Alert, answering all questions appropriately. Cranial nerves grossly intact. Skin: No rashes or petechiae noted. Psych: Normal affect.  LAB RESULTS:  Lab Results  Component Value Date   NA 137 12/21/2023   K 4.0 12/21/2023   CL 102 12/21/2023   CO2 26 12/21/2023   GLUCOSE 94 12/21/2023   BUN 21 (H) 12/21/2023   CREATININE 0.47 12/21/2023   CALCIUM  8.9 12/21/2023   PROT 7.7 12/21/2023   ALBUMIN   3.5 12/21/2023   AST 35 12/21/2023   ALT 25 12/21/2023   ALKPHOS 67 12/21/2023   BILITOT 0.7 12/21/2023   GFRNONAA >60 12/21/2023   GFRAA >60 06/09/2018    Lab Results  Component Value Date   WBC 3.1 (L) 12/21/2023   NEUTROABS 1.4 (L) 12/21/2023   HGB 10.8 (L) 12/21/2023   HCT 33.7 (L) 12/21/2023   MCV 94.1 12/21/2023   PLT 127 (L) 12/21/2023     STUDIES: CT CHEST ABDOMEN PELVIS W CONTRAST Result Date: 12/02/2023 CLINICAL DATA:  Metastatic colon cancer restaging * Tracking Code: BO * EXAM: CT CHEST, ABDOMEN, AND PELVIS WITH CONTRAST TECHNIQUE: Multidetector CT imaging of the chest, abdomen and pelvis was performed following the standard protocol during bolus administration of intravenous contrast. RADIATION DOSE REDUCTION: This exam was performed according to the departmental dose-optimization program which includes automated exposure control, adjustment of the mA and/or kV according to patient size and/or use of iterative reconstruction technique. CONTRAST:  OMNIPAQUE  IOHEXOL  300 MG/ML SOLN additional oral enteric contrast COMPARISON:  CT chest angiogram, 08/04/2023, CT abdomen pelvis, 08/05/2023 FINDINGS: CT CHEST FINDINGS Cardiovascular: Right chest port catheter. Aortic atherosclerosis. Normal heart size. No pericardial effusion. Mediastinum/Nodes: No enlarged mediastinal, hilar, or axillary lymph nodes. Thyroid  gland, trachea, and esophagus demonstrate no significant findings. Lungs/Pleura: Diminished size of multiple bilateral pulmonary nodules, index nodule of the superior segment left lower lobe measuring 0.8 x 0.7 cm, previously 1.4 x 1.1 cm (series 4, image 63), index nodule of the dependent left lower lobe measuring 1.2 x 1.2 cm, previously 1.7 x 1.4 cm (series 4, image 101). No pleural effusion or pneumothorax. Musculoskeletal: No chest wall abnormality. No acute osseous findings. CT ABDOMEN PELVIS FINDINGS Hepatobiliary: Diminished size of multiple hypoenhancing liver  metastases, some with interval calcification. Large index lesion centered in the caudate measures 4.8 x 3.5 cm, previously 6.6 x 4.0 cm (series 2, image 65). Additional lesion in the peripheral inferior right lobe of the liver, hepatic segment VI measures 3.3 x 2.7 cm, previously 4.6 x 3.7 cm (series 2, image 61). No gallstones, gallbladder wall thickening, or biliary dilatation. Pancreas: Unremarkable. No pancreatic ductal dilatation or surrounding inflammatory changes. Spleen: Normal in size without significant abnormality. Adrenals/Urinary Tract: Adrenal glands are unremarkable. Kidneys are normal, without renal calculi, solid lesion, or hydronephrosis. Bladder is  unremarkable. Stomach/Bowel: Stomach is within normal limits. Diminished size of a mass arising from the cecum, measuring 5.7 x 4.9 cm, previously 6.6 x 5.0 cm (series 2, image 67). Patulous distal ileum likely partially obstipated by mass (series 2, image 76). Vascular/Lymphatic: Scattered aortic atherosclerosis. Diminished size of numerous enlarged mesenteric and retroperitoneal lymph nodes, index left retroperitoneal node measuring 1.2 x 0.9 cm, previously 1.6 x 1.2 cm (series 2, image 82), index mesenteric node measuring 1.0 x 0.9 cm, previously 1.7 x 1.2 cm (series 2, image 82). Reproductive: Uterine fibroids. Probable right ovarian metastasis, diminished in size and partially calcified, measuring of proximally 5.3 x 5.1 cm, previously 7.3 x 6.5 cm when similarly measured (series 2, image 107). Other: Unchanged fat containing midline ventral hernia (series 2, image 76). No ascites. Musculoskeletal: No acute osseous findings. IMPRESSION: 1. Diminished size of cecal mass. 2. Diminished size of multiple bilateral pulmonary metastases. 3. Diminished size of multiple liver metastases. 4. Diminished size of numerous enlarged mesenteric and retroperitoneal lymph nodes. 5. 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 6.  Constellation of findings consistent with treatment response of primary colon malignancy and widespread metastatic disease. Aortic Atherosclerosis (ICD10-I70.0). Electronically Signed   By: Fredricka Jenny M.D.   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  Liver biopsy completed on August 06, 2023 confirmed diagnosis.  Patient's pretreatment CEA was 1780, but now has declined to 335.  Today's result is pending.  Repeat CT scan on November 30, 2023 reviewed independently and reported as above with significant improvement of patient's disease burden.  Proceed with cycle 8 of FOLFOX today.  Avastin  is on hold secondary to her significantly elevated blood pressure.  Return to clinic in 2 days for pump removal and then in 2 weeks for further evaluation and consideration of cycle 10.  Will add back Avastin  patient's blood pressure is better controlled.   Uterine masses: Patient reports she has a history of benign fibroids.  Appreciate gynecology input.  Monitor.   Anemia: Chronic and unchanged.  Patient's hemoglobin is 10.8 today. Leukopenia: Chronic and unchanged.  Proceed with treatment as above. Thrombocytopenia: Mild, monitor.  Patient's platelet count is 127 today. Insomnia: Significantly improved.  Patient does not complain of this today. Coping/adjustment: Patient was previously given a referral to [facility]. Hypertension: Patient blood pressure significantly elevated today.  Hold Avastin  as above.  Continue monitoring and treatment per primary care.    Patient expressed understanding and was in agreement with this plan. She also understands that She can call clinic at any time with any questions, concerns, or complaints.    Cancer Staging  Primary adenocarcinoma  of ascending colon [facility]) Staging form: Colon and Rectum, AJCC 8th Edition - Clinical stage from 08/17/2023: Stage IVB (cTX, cNX, pM1b) - Signed by Shellie Dials, MD on 08/17/2023 Stage prefix: Initial diagnosis   Shellie Dials, MD   12/21/2023 9:19 AM

## 2023-12-22 LAB — CEA: CEA: 302 ng/mL — ABNORMAL HIGH (ref 0.0–4.7)

## 2023-12-23 ENCOUNTER — Inpatient Hospital Stay: Payer: BC Managed Care – PPO

## 2023-12-23 VITALS — BP 156/88 | HR 58 | Temp 99.0°F | Resp 18

## 2023-12-23 DIAGNOSIS — Z5111 Encounter for antineoplastic chemotherapy: Secondary | ICD-10-CM | POA: Diagnosis not present

## 2023-12-23 DIAGNOSIS — C182 Malignant neoplasm of ascending colon: Secondary | ICD-10-CM

## 2023-12-23 MED ORDER — HEPARIN SOD (PORK) LOCK FLUSH 100 UNIT/ML IV SOLN
500.0000 [IU] | Freq: Once | INTRAVENOUS | Status: AC | PRN
  Administered 2023-12-23: 500 [IU]
  Filled 2023-12-23: qty 5

## 2023-12-23 MED ORDER — SODIUM CHLORIDE 0.9% FLUSH
10.0000 mL | INTRAVENOUS | Status: DC | PRN
  Administered 2023-12-23: 10 mL
  Filled 2023-12-23: qty 10

## 2023-12-29 ENCOUNTER — Other Ambulatory Visit: Payer: Self-pay

## 2023-12-30 ENCOUNTER — Encounter: Payer: Self-pay | Admitting: Oncology

## 2024-01-04 ENCOUNTER — Inpatient Hospital Stay: Payer: BC Managed Care – PPO

## 2024-01-04 ENCOUNTER — Inpatient Hospital Stay (HOSPITAL_BASED_OUTPATIENT_CLINIC_OR_DEPARTMENT_OTHER): Payer: BC Managed Care – PPO | Admitting: Oncology

## 2024-01-04 ENCOUNTER — Encounter: Payer: Self-pay | Admitting: Oncology

## 2024-01-04 VITALS — BP 175/88 | HR 53 | Temp 97.1°F | Wt 174.0 lb

## 2024-01-04 VITALS — BP 178/77 | Resp 18

## 2024-01-04 DIAGNOSIS — Z5111 Encounter for antineoplastic chemotherapy: Secondary | ICD-10-CM | POA: Diagnosis not present

## 2024-01-04 DIAGNOSIS — C182 Malignant neoplasm of ascending colon: Secondary | ICD-10-CM

## 2024-01-04 LAB — CBC WITH DIFFERENTIAL (CANCER CENTER ONLY)
Abs Immature Granulocytes: 0.01 10*3/uL (ref 0.00–0.07)
Basophils Absolute: 0 10*3/uL (ref 0.0–0.1)
Basophils Relative: 1 %
Eosinophils Absolute: 0.1 10*3/uL (ref 0.0–0.5)
Eosinophils Relative: 5 %
HCT: 32.7 % — ABNORMAL LOW (ref 36.0–46.0)
Hemoglobin: 10.7 g/dL — ABNORMAL LOW (ref 12.0–15.0)
Immature Granulocytes: 0 %
Lymphocytes Relative: 27 %
Lymphs Abs: 0.7 10*3/uL (ref 0.7–4.0)
MCH: 30.8 pg (ref 26.0–34.0)
MCHC: 32.7 g/dL (ref 30.0–36.0)
MCV: 94.2 fL (ref 80.0–100.0)
Monocytes Absolute: 0.4 10*3/uL (ref 0.1–1.0)
Monocytes Relative: 15 %
Neutro Abs: 1.3 10*3/uL — ABNORMAL LOW (ref 1.7–7.7)
Neutrophils Relative %: 52 %
Platelet Count: 124 10*3/uL — ABNORMAL LOW (ref 150–400)
RBC: 3.47 MIL/uL — ABNORMAL LOW (ref 3.87–5.11)
RDW: 16.7 % — ABNORMAL HIGH (ref 11.5–15.5)
WBC Count: 2.6 10*3/uL — ABNORMAL LOW (ref 4.0–10.5)
nRBC: 0 % (ref 0.0–0.2)

## 2024-01-04 LAB — CMP (CANCER CENTER ONLY)
ALT: 21 U/L (ref 0–44)
AST: 33 U/L (ref 15–41)
Albumin: 3.3 g/dL — ABNORMAL LOW (ref 3.5–5.0)
Alkaline Phosphatase: 55 U/L (ref 38–126)
Anion gap: 9 (ref 5–15)
BUN: 18 mg/dL (ref 6–20)
CO2: 25 mmol/L (ref 22–32)
Calcium: 8.6 mg/dL — ABNORMAL LOW (ref 8.9–10.3)
Chloride: 104 mmol/L (ref 98–111)
Creatinine: 0.59 mg/dL (ref 0.44–1.00)
GFR, Estimated: >60 mL/min (ref >60)
Glucose, Bld: 99 mg/dL (ref 70–99)
Potassium: 3.9 mmol/L (ref 3.5–5.1)
Sodium: 138 mmol/L (ref 135–145)
Total Bilirubin: 0.9 mg/dL (ref 0.0–1.2)
Total Protein: 7.4 g/dL (ref 6.5–8.1)

## 2024-01-04 LAB — TOTAL PROTEIN, URINE DIPSTICK: Protein, ur: NEGATIVE mg/dL

## 2024-01-04 MED ORDER — DEXTROSE 5 % IV SOLN
Freq: Once | INTRAVENOUS | Status: AC
  Filled 2024-01-04: qty 250

## 2024-01-04 MED ORDER — FLUOROURACIL CHEMO INJECTION 2.5 GM/50ML
400.0000 mg/m2 | Freq: Once | INTRAVENOUS | Status: AC
  Administered 2024-01-04: 700 mg via INTRAVENOUS
  Filled 2024-01-04: qty 14

## 2024-01-04 MED ORDER — LEUCOVORIN CALCIUM INJECTION 350 MG
400.0000 mg/m2 | Freq: Once | INTRAVENOUS | Status: AC
  Administered 2024-01-04: 716 mg via INTRAVENOUS
  Filled 2024-01-04: qty 35.8

## 2024-01-04 MED ORDER — DEXAMETHASONE SODIUM PHOSPHATE 10 MG/ML IJ SOLN
10.0000 mg | Freq: Once | INTRAMUSCULAR | Status: AC
  Administered 2024-01-04: 10 mg via INTRAVENOUS
  Filled 2024-01-04: qty 1

## 2024-01-04 MED ORDER — PALONOSETRON HCL INJECTION 0.25 MG/5ML
0.2500 mg | Freq: Once | INTRAVENOUS | Status: AC
  Administered 2024-01-04: 0.25 mg via INTRAVENOUS
  Filled 2024-01-04: qty 5

## 2024-01-04 MED ORDER — SODIUM CHLORIDE 0.9 % IV SOLN
2400.0000 mg/m2 | INTRAVENOUS | Status: DC
  Administered 2024-01-04: 4300 mg via INTRAVENOUS
  Filled 2024-01-04: qty 86

## 2024-01-04 MED ORDER — OXALIPLATIN CHEMO INJECTION 100 MG/20ML
85.0000 mg/m2 | Freq: Once | INTRAVENOUS | Status: AC
  Administered 2024-01-04: 150 mg via INTRAVENOUS
  Filled 2024-01-04: qty 26.65

## 2024-01-04 NOTE — Progress Notes (Signed)
 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  INTERVAL HISTORY: Patient returns to clinic today for further evaluation and consideration of cycle 10 of FOLFOX.  Avastin has been on hold recently secondary to persistent hypertension.  She currently feels well and is asymptomatic.  She continues to tolerate her treatments without significant side effects.  She does not complain of any weakness or fatigue. She denies any pain.  She denies any recent fevers or illnesses.  She has no neurologic complaints.  She has no chest pain, shortness of breath, cough, or hemoptysis.  She denies any nausea, vomiting, constipation, or diarrhea.  She has no melena or hematochezia.  She has no urinary complaints.  Patient offers no specific complaints today.  REVIEW OF SYSTEMS:   Review of Systems  Constitutional: Negative.  Negative for fever, malaise/fatigue and weight loss.  Respiratory: Negative.  Negative for cough, hemoptysis and shortness of breath.   Cardiovascular: Negative.  Negative for chest pain and leg swelling.  Gastrointestinal: Negative.  Negative for abdominal pain, blood in stool, constipation, diarrhea, melena, nausea and vomiting.  Genitourinary: Negative.  Negative for dysuria.  Musculoskeletal: Negative.  Negative for back pain.  Neurological: Negative.  Negative for dizziness, focal weakness, weakness and headaches.  Psychiatric/Behavioral: Negative.  The patient is not nervous/anxious.     As per HPI. Otherwise, a complete review of systems is negative.  PAST MEDICAL HISTORY: Past Medical History:  Diagnosis Date    Abnormal uterine bleeding    Anemia    Anxiety    Asthma    Cardiomegaly    Cardiovascular disease    COPD (chronic obstructive pulmonary disease) (HCC)    Depression    Diabetes mellitus without complication (HCC)    Dyspnea    Goiter, nontoxic, multinodular    Heart murmur    Hypertension    Hypothyroidism    Lung cancer, primary, with metastasis from lung to other site [facility])    Malignant neoplasm of ascending colon (HCC)    Primary adenocarcinoma of ascending colon (HCC)    Syncopal episodes    Thyrotoxicosis    Uterine leiomyoma     PAST SURGICAL HISTORY: Past Surgical History:  Procedure Laterality Date   BIOPSY  08/12/2023   Procedure: BIOPSY;  Surgeon: Norma Fredrickson, Boykin Nearing, MD;  Location: [facility] ENDOSCOPY;  Service: Gastroenterology;;   CESAREAN SECTION     COLONOSCOPY N/A 08/12/2023   Procedure: COLONOSCOPY;  Surgeon: Toledo, Boykin Nearing, MD;  Location: [facility] ENDOSCOPY;  Service: Gastroenterology;  Laterality: N/A;  3rd patient   COLONOSCOPY WITH PROPOFOL N/A 10/29/2015   Procedure: COLONOSCOPY WITH PROPOFOL;  Surgeon: Elnita Maxwell, MD;  Location: [facility] ENDOSCOPY;  Service: Endoscopy;  Laterality: N/A;   DILITATION & CURRETTAGE/HYSTROSCOPY WITH NOVASURE ABLATION N/A 06/25/2018   Procedure: DILATATION & CURETTAGE/HYSTEROSCOPY WITH MINERVA ABLATION;  Surgeon: Hildred Laser, MD;  Location: [facility] ORS;  Service: Gynecology;  Laterality: N/A;   PORTACATH PLACEMENT N/A 08/26/2023   Procedure: INSERTION PORT-A-CATH;  Surgeon: Carolan Shiver, MD;  Location: [facility] ORS;  Service: General;  Laterality: N/A;   SUBMUCOSAL TATTOO INJECTION  08/12/2023   Procedure: SUBMUCOSAL TATTOO INJECTION;  Surgeon: Norma Fredrickson, Boykin Nearing, MD;  Location: [facility] ENDOSCOPY;  Service: Gastroenterology;;   TUBAL LIGATION      FAMILY HISTORY: Family History  Adopted: Yes  Problem Relation Age of Onset   Breast cancer Neg Hx     ADVANCED DIRECTIVES (Y/N):  N  HEALTH MAINTENANCE: Social History    Tobacco Use   Smoking status: Former    Current packs/day: 0.00    Types: Cigarettes    Quit date: 2024    Years since quitting: 1.1   Smokeless tobacco: Never  Vaping Use   Vaping status: Former  Substance Use Topics   Alcohol use: Never   Drug use: Never     Colonoscopy:  PAP:  Bone density:  Lipid panel:  No Known Allergies  Current Outpatient Medications  Medication Sig Dispense Refill   albuterol (PROVENTIL HFA;VENTOLIN HFA) 108 (90 Base) MCG/ACT inhaler Inhale 2 puffs into the lungs every 6 (six) hours as needed (for exercise induced asthma).     atorvastatin (LIPITOR) 10 MG tablet SMARTSIG:1 Tablet(s) By Mouth Every Evening     ezetimibe (ZETIA) 10 MG tablet Take 10 mg by mouth daily.     hydrALAZINE (APRESOLINE) 10 MG tablet Take 10 mg by mouth 3 (three) times daily as needed.     levothyroxine (SYNTHROID) 75 MCG tablet Take 75 mcg by mouth daily.     lidocaine-prilocaine (EMLA) cream Apply to affected area once 30 g 3   metFORMIN (GLUCOPHAGE) 500 MG tablet Take 500 mg by mouth 2 (two) times daily.      metoprolol tartrate (LOPRESSOR) 25 MG tablet Take 25 mg by mouth 2 (two) times daily.     Multiple Vitamins-Minerals (ALIVE ONCE DAILY WOMENS PO) Take 2 tablets by mouth daily. Gummies     ondansetron (ZOFRAN) 8 MG tablet Take 1 tablet (8 mg total) by mouth every 8 (eight) hours as needed for nausea or vomiting. Start on the third day after chemotherapy. 60 tablet 1   prochlorperazine (COMPAZINE) 10 MG tablet Take 1 tablet (10 mg total) by mouth every 6 (six) hours as needed for nausea or vomiting. 60 tablet 1   No current facility-administered medications for this visit.   Facility-Administered Medications Ordered in Other Visits  Medication Dose Route Frequency Provider Last Rate Last Admin   sodium chloride flush (NS) 0.9 % injection 10 mL  10 mL Intracatheter PRN Jeralyn Ruths, MD   10 mL at 12/09/23 1322    OBJECTIVE: Vitals:   01/04/24 0846  BP: (!)  175/88  Pulse: (!) 53  Temp: (!) 97.1 F (36.2 C)  SpO2: 100%       Body mass index is 28.08 kg/m.    ECOG FS:0 - Asymptomatic  General: Well-developed, well-nourished, no acute distress. Eyes: Pink conjunctiva, anicteric sclera. HEENT: Normocephalic, moist mucous membranes. Lungs: No audible wheezing or coughing. Heart: Regular rate and rhythm. Abdomen: Soft, nontender, no obvious distention. Musculoskeletal: No edema, cyanosis, or clubbing. Neuro: Alert, answering all questions appropriately. Cranial nerves grossly intact. Skin: No rashes or petechiae noted. Psych: Normal affect.  LAB RESULTS:  Lab Results  Component Value Date   NA 138 01/04/2024   K 3.9 01/04/2024   CL 104 01/04/2024   CO2 25 01/04/2024   GLUCOSE 99 01/04/2024   BUN 18 01/04/2024   CREATININE 0.59 01/04/2024   CALCIUM 8.6 (L) 01/04/2024   PROT 7.4 01/04/2024   ALBUMIN 3.3 (L) 01/04/2024   AST 33 01/04/2024   ALT 21 01/04/2024   ALKPHOS 55 01/04/2024  BILITOT 0.9 01/04/2024   GFRNONAA >60 01/04/2024   GFRAA >60 06/09/2018    Lab Results  Component Value Date   WBC 2.6 (L) 01/04/2024   NEUTROABS 1.3 (L) 01/04/2024   HGB 10.7 (L) 01/04/2024   HCT 32.7 (L) 01/04/2024   MCV 94.2 01/04/2024   PLT 124 (L) 01/04/2024     STUDIES: No results found.   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  Liver biopsy completed on August 06, 2023 confirmed diagnosis.  Patient's pretreatment CEA was 1780, but now has declined to 302.  Today's result is pending.  Repeat CT scan on November 30, 2023 reviewed independently with significant improvement of patient's disease burden.  Proceed with cycle 10 of FOLFOX today.  Avastin continues to be on hold secondary to persistently elevated blood  pressure.  Return to clinic in 2 days for pump removal and then in 2 weeks for further evaluation and consideration of cycle 11.  Will add back Avastin patient's blood pressure is better controlled.   Uterine masses: Patient reports she has a history of benign fibroids.  Appreciate gynecology input.  Monitor.   Anemia: Chronic and unchanged.  Patient's hemoglobin is 10.7 today. Leukopenia: Chronic and unchanged.  Patient's ANC is 1.3.  Proceed with treatment as above. Thrombocytopenia: Chronic and unchanged.  Patient platelet count is 124 today. Insomnia: Significantly improved.  Patient does not complain of this today. Coping/adjustment: Patient was previously given a referral to [facility]. Hypertension: Chronic and unchanged.  Patient's blood pressure remains persistently elevated.  Hold Avastin as above.  Continue monitoring and treatment per primary care.    Patient expressed understanding and was in agreement with this plan. She also understands that She can call clinic at any time with any questions, concerns, or complaints.    Cancer Staging  Primary adenocarcinoma of ascending colon [facility]) Staging form: Colon and Rectum, AJCC 8th Edition - Clinical stage from 08/17/2023: Stage IVB (cTX, cNX, pM1b) - Signed by Jeralyn Ruths, MD on 08/17/2023 Stage prefix: Initial diagnosis   Jeralyn Ruths, MD   01/04/2024 9:12 AM

## 2024-01-04 NOTE — Patient Instructions (Signed)
 [facility] - A DEPT OF [facility]  Discharge Instructions: Thank you for choosing [facility] to provide your oncology and hematology care.  If you have a lab appointment with the Cancer Center, please go directly to the Cancer Center and check in at the registration area.  Wear comfortable clothing and clothing appropriate for easy access to any Portacath or PICC line.   We strive to give you quality time with your provider. You may need to reschedule your appointment if you arrive late (15 or more minutes).  Arriving late affects you and other patients whose appointments are after yours.  Also, if you miss three or more appointments without notifying the office, you may be dismissed from the clinic at the provider's discretion.      For prescription refill requests, have your pharmacy contact our office and allow 72 hours for refills to be completed.    Today you received the following chemotherapy and/or immunotherapy agents Oxaliplatin, Leucovorin, Adrucil      To help prevent nausea and vomiting after your treatment, we encourage you to take your nausea medication as directed.  BELOW ARE SYMPTOMS THAT SHOULD BE REPORTED IMMEDIATELY: *FEVER GREATER THAN 100.4 F (38 C) OR HIGHER *CHILLS OR SWEATING *NAUSEA AND VOMITING THAT IS NOT CONTROLLED WITH YOUR NAUSEA MEDICATION *UNUSUAL SHORTNESS OF BREATH *UNUSUAL BRUISING OR BLEEDING *URINARY PROBLEMS (pain or burning when urinating, or frequent urination) *BOWEL PROBLEMS (unusual diarrhea, constipation, pain near the anus) TENDERNESS IN MOUTH AND THROAT WITH OR WITHOUT PRESENCE OF ULCERS (sore throat, sores in mouth, or a toothache) UNUSUAL RASH, SWELLING OR PAIN  UNUSUAL VAGINAL DISCHARGE OR ITCHING   Items with * indicate a potential emergency and should be followed up as soon as possible or go to the Emergency Department if any problems should occur.  Please show the CHEMOTHERAPY ALERT  CARD or IMMUNOTHERAPY ALERT CARD at check-in to the Emergency Department and triage nurse.  Should you have questions after your visit or need to cancel or reschedule your appointment, please contact [facility] - A DEPT OF [facility]  [phone] and follow the prompts.  Office hours are 8:00 a.m. to 4:30 p.m. Monday - Friday. Please note that voicemails left after 4:00 p.m. may not be returned until the following business day.  We are closed weekends and major holidays. You have access to a nurse at all times for urgent questions. Please call the main number to the clinic [phone] and follow the prompts.  For any non-urgent questions, you may also contact your provider using MyChart. We now offer e-Visits for anyone 24 and older to request care online for non-urgent symptoms. For details visit [email or web address].   Also download the MyChart app! Go to the app store, search "MyChart", open the app, select [name], and log in with your MyChart username and password.

## 2024-01-05 LAB — CEA: CEA: 260 ng/mL — ABNORMAL HIGH (ref 0.0–4.7)

## 2024-01-06 ENCOUNTER — Inpatient Hospital Stay: Payer: BC Managed Care – PPO

## 2024-01-06 VITALS — BP 171/84 | HR 59 | Temp 97.4°F | Resp 18

## 2024-01-06 DIAGNOSIS — C182 Malignant neoplasm of ascending colon: Secondary | ICD-10-CM

## 2024-01-06 DIAGNOSIS — Z5111 Encounter for antineoplastic chemotherapy: Secondary | ICD-10-CM | POA: Diagnosis not present

## 2024-01-06 MED ORDER — SODIUM CHLORIDE 0.9% FLUSH
10.0000 mL | INTRAVENOUS | Status: DC | PRN
  Administered 2024-01-06: 10 mL
  Filled 2024-01-06: qty 10

## 2024-01-06 MED ORDER — HEPARIN SOD (PORK) LOCK FLUSH 100 UNIT/ML IV SOLN
500.0000 [IU] | Freq: Once | INTRAVENOUS | Status: AC | PRN
  Administered 2024-01-06: 500 [IU]
  Filled 2024-01-06: qty 5

## 2024-01-12 ENCOUNTER — Encounter: Payer: Self-pay | Admitting: Oncology

## 2024-01-18 ENCOUNTER — Encounter: Payer: Self-pay | Admitting: Oncology

## 2024-01-18 ENCOUNTER — Inpatient Hospital Stay: Payer: BC Managed Care – PPO

## 2024-01-18 ENCOUNTER — Inpatient Hospital Stay: Payer: BC Managed Care – PPO | Attending: Oncology | Admitting: Oncology

## 2024-01-18 VITALS — BP 183/92 | HR 53 | Temp 98.6°F | Resp 20 | Wt 177.9 lb

## 2024-01-18 VITALS — BP 172/78 | HR 50

## 2024-01-18 DIAGNOSIS — C182 Malignant neoplasm of ascending colon: Secondary | ICD-10-CM | POA: Insufficient documentation

## 2024-01-18 DIAGNOSIS — D649 Anemia, unspecified: Secondary | ICD-10-CM | POA: Diagnosis not present

## 2024-01-18 DIAGNOSIS — Z87891 Personal history of nicotine dependence: Secondary | ICD-10-CM | POA: Insufficient documentation

## 2024-01-18 DIAGNOSIS — D696 Thrombocytopenia, unspecified: Secondary | ICD-10-CM | POA: Diagnosis not present

## 2024-01-18 DIAGNOSIS — Z5111 Encounter for antineoplastic chemotherapy: Secondary | ICD-10-CM | POA: Insufficient documentation

## 2024-01-18 DIAGNOSIS — C7800 Secondary malignant neoplasm of unspecified lung: Secondary | ICD-10-CM | POA: Insufficient documentation

## 2024-01-18 DIAGNOSIS — G4700 Insomnia, unspecified: Secondary | ICD-10-CM | POA: Insufficient documentation

## 2024-01-18 DIAGNOSIS — D72819 Decreased white blood cell count, unspecified: Secondary | ICD-10-CM | POA: Insufficient documentation

## 2024-01-18 DIAGNOSIS — C787 Secondary malignant neoplasm of liver and intrahepatic bile duct: Secondary | ICD-10-CM | POA: Insufficient documentation

## 2024-01-18 DIAGNOSIS — I10 Essential (primary) hypertension: Secondary | ICD-10-CM | POA: Diagnosis not present

## 2024-01-18 LAB — CMP (CANCER CENTER ONLY)
ALT: 23 U/L (ref 0–44)
AST: 34 U/L (ref 15–41)
Albumin: 3.2 g/dL — ABNORMAL LOW (ref 3.5–5.0)
Alkaline Phosphatase: 59 U/L (ref 38–126)
Anion gap: 8 (ref 5–15)
BUN: 19 mg/dL (ref 6–20)
CO2: 24 mmol/L (ref 22–32)
Calcium: 8.5 mg/dL — ABNORMAL LOW (ref 8.9–10.3)
Chloride: 104 mmol/L (ref 98–111)
Creatinine: 0.77 mg/dL (ref 0.44–1.00)
GFR, Estimated: >60 mL/min (ref >60)
Glucose, Bld: 129 mg/dL — ABNORMAL HIGH (ref 70–99)
Potassium: 3.7 mmol/L (ref 3.5–5.1)
Sodium: 136 mmol/L (ref 135–145)
Total Bilirubin: 0.8 mg/dL (ref 0.0–1.2)
Total Protein: 7.2 g/dL (ref 6.5–8.1)

## 2024-01-18 LAB — CBC WITH DIFFERENTIAL (CANCER CENTER ONLY)
Abs Immature Granulocytes: 0.01 10*3/uL (ref 0.00–0.07)
Basophils Absolute: 0 10*3/uL (ref 0.0–0.1)
Basophils Relative: 0 %
Eosinophils Absolute: 0.1 10*3/uL (ref 0.0–0.5)
Eosinophils Relative: 4 %
HCT: 31.5 % — ABNORMAL LOW (ref 36.0–46.0)
Hemoglobin: 10.3 g/dL — ABNORMAL LOW (ref 12.0–15.0)
Immature Granulocytes: 0 %
Lymphocytes Relative: 26 %
Lymphs Abs: 0.7 10*3/uL (ref 0.7–4.0)
MCH: 30.8 pg (ref 26.0–34.0)
MCHC: 32.7 g/dL (ref 30.0–36.0)
MCV: 94.3 fL (ref 80.0–100.0)
Monocytes Absolute: 0.4 10*3/uL (ref 0.1–1.0)
Monocytes Relative: 13 %
Neutro Abs: 1.6 10*3/uL — ABNORMAL LOW (ref 1.7–7.7)
Neutrophils Relative %: 57 %
Platelet Count: 114 10*3/uL — ABNORMAL LOW (ref 150–400)
RBC: 3.34 MIL/uL — ABNORMAL LOW (ref 3.87–5.11)
RDW: 16.4 % — ABNORMAL HIGH (ref 11.5–15.5)
WBC Count: 2.8 10*3/uL — ABNORMAL LOW (ref 4.0–10.5)
nRBC: 0 % (ref 0.0–0.2)

## 2024-01-18 LAB — TOTAL PROTEIN, URINE DIPSTICK: Protein, ur: NEGATIVE mg/dL

## 2024-01-18 MED ORDER — PALONOSETRON HCL INJECTION 0.25 MG/5ML
0.2500 mg | Freq: Once | INTRAVENOUS | Status: AC
  Administered 2024-01-18: 0.25 mg via INTRAVENOUS

## 2024-01-18 MED ORDER — DEXTROSE 5 % IV SOLN
Freq: Once | INTRAVENOUS | Status: AC
  Filled 2024-01-18: qty 250

## 2024-01-18 MED ORDER — DEXAMETHASONE SODIUM PHOSPHATE 10 MG/ML IJ SOLN
10.0000 mg | Freq: Once | INTRAMUSCULAR | Status: AC
  Administered 2024-01-18: 10 mg via INTRAVENOUS

## 2024-01-18 MED ORDER — SODIUM CHLORIDE 0.9 % IV SOLN
2400.0000 mg/m2 | INTRAVENOUS | Status: DC
  Administered 2024-01-18: 4300 mg via INTRAVENOUS
  Filled 2024-01-18: qty 86

## 2024-01-18 MED ORDER — SODIUM CHLORIDE 0.9 % IV SOLN
Freq: Once | INTRAVENOUS | Status: AC
  Filled 2024-01-18: qty 250

## 2024-01-18 MED ORDER — OXALIPLATIN CHEMO INJECTION 100 MG/20ML
85.0000 mg/m2 | Freq: Once | INTRAVENOUS | Status: AC
  Administered 2024-01-18: 150 mg via INTRAVENOUS
  Filled 2024-01-18: qty 10

## 2024-01-18 MED ORDER — FLUOROURACIL CHEMO INJECTION 2.5 GM/50ML
400.0000 mg/m2 | Freq: Once | INTRAVENOUS | Status: AC
  Administered 2024-01-18: 700 mg via INTRAVENOUS
  Filled 2024-01-18: qty 14

## 2024-01-18 MED ORDER — LEUCOVORIN CALCIUM INJECTION 350 MG
400.0000 mg/m2 | Freq: Once | INTRAVENOUS | Status: AC
  Administered 2024-01-18: 716 mg via INTRAVENOUS
  Filled 2024-01-18: qty 18.3

## 2024-01-18 NOTE — Patient Instructions (Signed)

## 2024-01-18 NOTE — Progress Notes (Signed)
 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  INTERVAL HISTORY: Patient returns to clinic today for further evaluation and consideration of cycle 11 of FOLFOX.  Avastin continues to be on hold secondary to persistently elevated blood pressures.  She currently feels well and is asymptomatic.  She does not complain of any weakness or fatigue. She denies any pain.  She denies any recent fevers or illnesses.  She has no neurologic complaints.  She has no chest pain, shortness of breath, cough, or hemoptysis.  She denies any nausea, vomiting, constipation, or diarrhea.  She has no melena or hematochezia.  She has no urinary complaints.  Patient offers no specific complaints today.  REVIEW OF SYSTEMS:   Review of Systems  Constitutional: Negative.  Negative for fever, malaise/fatigue and weight loss.  Respiratory: Negative.  Negative for cough, hemoptysis and shortness of breath.   Cardiovascular: Negative.  Negative for chest pain and leg swelling.  Gastrointestinal: Negative.  Negative for abdominal pain, blood in stool, constipation, diarrhea, melena, nausea and vomiting.  Genitourinary: Negative.  Negative for dysuria.  Musculoskeletal: Negative.  Negative for back pain.  Neurological: Negative.  Negative for dizziness, focal weakness, weakness and headaches.  Psychiatric/Behavioral: Negative.  The patient is not nervous/anxious.     As per HPI. Otherwise, a complete review of systems is negative.  PAST MEDICAL HISTORY: Past Medical History:  Diagnosis Date   Abnormal uterine bleeding    Anemia    Anxiety    Asthma     Cardiomegaly    Cardiovascular disease    COPD (chronic obstructive pulmonary disease) (HCC)    Depression    Diabetes mellitus without complication (HCC)    Dyspnea    Goiter, nontoxic, multinodular    Heart murmur    Hypertension    Hypothyroidism    Lung cancer, primary, with metastasis from lung to other site [facility])    Malignant neoplasm of ascending colon (HCC)    Primary adenocarcinoma of ascending colon (HCC)    Syncopal episodes    Thyrotoxicosis    Uterine leiomyoma     PAST SURGICAL HISTORY: Past Surgical History:  Procedure Laterality Date   BIOPSY  08/12/2023   Procedure: BIOPSY;  Surgeon: Norma Fredrickson, Boykin Nearing, MD;  Location: [facility] ENDOSCOPY;  Service: Gastroenterology;;   CESAREAN SECTION     COLONOSCOPY N/A 08/12/2023   Procedure: COLONOSCOPY;  Surgeon: Toledo, Boykin Nearing, MD;  Location: [facility] ENDOSCOPY;  Service: Gastroenterology;  Laterality: N/A;  3rd patient   COLONOSCOPY WITH PROPOFOL N/A 10/29/2015   Procedure: COLONOSCOPY WITH PROPOFOL;  Surgeon: Elnita Maxwell, MD;  Location: [facility] ENDOSCOPY;  Service: Endoscopy;  Laterality: N/A;   DILITATION & CURRETTAGE/HYSTROSCOPY WITH NOVASURE ABLATION N/A 06/25/2018   Procedure: DILATATION & CURETTAGE/HYSTEROSCOPY WITH MINERVA ABLATION;  Surgeon: Hildred Laser, MD;  Location: [facility] ORS;  Service: Gynecology;  Laterality: N/A;   PORTACATH PLACEMENT N/A 08/26/2023   Procedure: INSERTION PORT-A-CATH;  Surgeon: Carolan Shiver, MD;  Location: [facility] ORS;  Service: General;  Laterality: N/A;   SUBMUCOSAL TATTOO INJECTION  08/12/2023   Procedure: SUBMUCOSAL TATTOO INJECTION;  Surgeon: Toledo, Boykin Nearing, MD;  Location: [facility] ENDOSCOPY;  Service: Gastroenterology;;   TUBAL  LIGATION      FAMILY HISTORY: Family History  Adopted: Yes  Problem Relation Age of Onset   Breast cancer Neg Hx     ADVANCED DIRECTIVES (Y/N):  N  HEALTH MAINTENANCE: Social History   Tobacco Use   Smoking status: Former    Current packs/day:  0.00    Types: Cigarettes    Quit date: 2024    Years since quitting: 1.1   Smokeless tobacco: Never  Vaping Use   Vaping status: Former  Substance Use Topics   Alcohol use: Never   Drug use: Never     Colonoscopy:  PAP:  Bone density:  Lipid panel:  No Known Allergies  Current Outpatient Medications  Medication Sig Dispense Refill   albuterol (PROVENTIL HFA;VENTOLIN HFA) 108 (90 Base) MCG/ACT inhaler Inhale 2 puffs into the lungs every 6 (six) hours as needed (for exercise induced asthma).     atorvastatin (LIPITOR) 10 MG tablet SMARTSIG:1 Tablet(s) By Mouth Every Evening     ezetimibe (ZETIA) 10 MG tablet Take 10 mg by mouth daily.     hydrALAZINE (APRESOLINE) 10 MG tablet Take 10 mg by mouth 3 (three) times daily as needed.     levothyroxine (SYNTHROID) 75 MCG tablet Take 75 mcg by mouth daily.     lidocaine-prilocaine (EMLA) cream Apply to affected area once 30 g 3   metFORMIN (GLUCOPHAGE) 500 MG tablet Take 500 mg by mouth 2 (two) times daily.      metoprolol tartrate (LOPRESSOR) 25 MG tablet Take 25 mg by mouth 2 (two) times daily.     Multiple Vitamins-Minerals (ALIVE ONCE DAILY WOMENS PO) Take 2 tablets by mouth daily. Gummies     ondansetron (ZOFRAN) 8 MG tablet Take 1 tablet (8 mg total) by mouth every 8 (eight) hours as needed for nausea or vomiting. Start on the third day after chemotherapy. 60 tablet 1   prochlorperazine (COMPAZINE) 10 MG tablet Take 1 tablet (10 mg total) by mouth every 6 (six) hours as needed for nausea or vomiting. 60 tablet 1   No current facility-administered medications for this visit.   Facility-Administered Medications Ordered in Other Visits  Medication Dose Route Frequency Provider Last Rate Last Admin   fluorouracil (ADRUCIL) 4,300 mg in sodium chloride 0.9 % 64 mL chemo infusion  2,400 mg/m2 (Treatment Plan Recorded) Intravenous 1 day or 1 dose Jeralyn Ruths, MD   Infusion Verify at 01/18/24 1325   sodium chloride flush (NS) 0.9 %  injection 10 mL  10 mL Intracatheter PRN Jeralyn Ruths, MD   10 mL at 12/09/23 1322    OBJECTIVE: Vitals:   01/18/24 0855  BP: (!) 183/92  Pulse: (!) 53  Resp: 20  Temp: 98.6 F (37 C)  SpO2: 100%       Body mass index is 28.71 kg/m.    ECOG FS:0 - Asymptomatic  General: Well-developed, well-nourished, no acute distress. Eyes: Pink conjunctiva, anicteric sclera. HEENT: Normocephalic, moist mucous membranes. Lungs: No audible wheezing or coughing. Heart: Regular rate and rhythm. Abdomen: Soft, nontender, no obvious distention. Musculoskeletal: No edema, cyanosis, or clubbing. Neuro: Alert, answering all questions appropriately. Cranial nerves grossly intact. Skin: No rashes or petechiae noted. Psych: Normal affect.  LAB RESULTS:  Lab Results  Component Value Date   NA 136 01/18/2024   K 3.7 01/18/2024   CL 104 01/18/2024   CO2 24 01/18/2024   GLUCOSE 129 (H) 01/18/2024   BUN 19 01/18/2024   CREATININE 0.77 01/18/2024  CALCIUM 8.5 (L) 01/18/2024   PROT 7.2 01/18/2024   ALBUMIN 3.2 (L) 01/18/2024   AST 34 01/18/2024   ALT 23 01/18/2024   ALKPHOS 59 01/18/2024   BILITOT 0.8 01/18/2024   GFRNONAA >60 01/18/2024   GFRAA >60 06/09/2018    Lab Results  Component Value Date   WBC 2.8 (L) 01/18/2024   NEUTROABS 1.6 (L) 01/18/2024   HGB 10.3 (L) 01/18/2024   HCT 31.5 (L) 01/18/2024   MCV 94.3 01/18/2024   PLT 114 (L) 01/18/2024     STUDIES: No results found.   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  Liver biopsy completed on August 06, 2023 confirmed diagnosis.  Patient's pretreatment CEA was 1780, but now has declined to 260.  Today's result is pending.  Repeat CT scan on November 30, 2023 reviewed independently with significant  improvement of patient's disease burden.  Proceed with cycle 11 of FOLFOX today.  Avastin continues to be on hold secondary to persistently elevated blood pressure.  Return to clinic in 2 days for pump removal and in 2 weeks for further evaluation and consideration of cycle 12.  Will reimage at the conclusion of cycle 12.  Will add back Avastin patient's blood pressure is better controlled.   Uterine masses: Patient reports she has a history of benign fibroids.  Appreciate gynecology input.  Monitor.   Anemia: Chronic and unchanged.  Patient's hemoglobin is 10.3 today. Leukopenia: Chronic and unchanged.  Patient's ANC is 1.6.  Proceed with treatment as above. Thrombocytopenia: Chronic and unchanged.  Patient's platelet count is 114. Insomnia: Significantly improved.  Patient does not complain of this today. Coping/adjustment: Patient was previously given a referral to [facility]. Hypertension: Chronic and unchanged.  Patient's blood pressure remains persistently elevated.  Hold Avastin as above.  Continue monitoring and treatment per primary care.    Patient expressed understanding and was in agreement with this plan. She also understands that She can call clinic at any time with any questions, concerns, or complaints.    Cancer Staging  Primary adenocarcinoma of ascending colon [facility]) Staging form: Colon and Rectum, AJCC 8th Edition - Clinical stage from 08/17/2023: Stage IVB (cTX, cNX, pM1b) - Signed by Jeralyn Ruths, MD on 08/17/2023 Stage prefix: Initial diagnosis   Jeralyn Ruths, MD   01/18/2024 1:39 PM

## 2024-01-19 LAB — CEA: CEA: 239 ng/mL — ABNORMAL HIGH (ref 0.0–4.7)

## 2024-01-20 ENCOUNTER — Inpatient Hospital Stay: Payer: BC Managed Care – PPO

## 2024-01-20 VITALS — BP 172/86 | HR 65 | Temp 97.9°F | Resp 18

## 2024-01-20 DIAGNOSIS — Z5111 Encounter for antineoplastic chemotherapy: Secondary | ICD-10-CM | POA: Diagnosis not present

## 2024-01-20 DIAGNOSIS — C182 Malignant neoplasm of ascending colon: Secondary | ICD-10-CM

## 2024-01-20 MED ORDER — HEPARIN SOD (PORK) LOCK FLUSH 100 UNIT/ML IV SOLN
500.0000 [IU] | Freq: Once | INTRAVENOUS | Status: AC | PRN
  Administered 2024-01-20: 500 [IU]
  Filled 2024-01-20: qty 5

## 2024-01-20 MED ORDER — SODIUM CHLORIDE 0.9% FLUSH
10.0000 mL | INTRAVENOUS | Status: DC | PRN
Start: 2024-01-20 — End: 2024-01-20
  Administered 2024-01-20: 10 mL
  Filled 2024-01-20: qty 10

## 2024-02-01 ENCOUNTER — Inpatient Hospital Stay: Payer: BC Managed Care – PPO

## 2024-02-01 ENCOUNTER — Encounter: Payer: Self-pay | Admitting: Oncology

## 2024-02-01 ENCOUNTER — Inpatient Hospital Stay (HOSPITAL_BASED_OUTPATIENT_CLINIC_OR_DEPARTMENT_OTHER): Payer: BC Managed Care – PPO | Admitting: Oncology

## 2024-02-01 VITALS — BP 176/83 | HR 52 | Resp 16

## 2024-02-01 VITALS — BP 167/86 | HR 50 | Temp 97.8°F | Resp 16 | Ht 66.0 in | Wt 179.3 lb

## 2024-02-01 DIAGNOSIS — C182 Malignant neoplasm of ascending colon: Secondary | ICD-10-CM

## 2024-02-01 DIAGNOSIS — Z5111 Encounter for antineoplastic chemotherapy: Secondary | ICD-10-CM | POA: Diagnosis not present

## 2024-02-01 LAB — CMP (CANCER CENTER ONLY)
ALT: 24 U/L (ref 0–44)
AST: 38 U/L (ref 15–41)
Albumin: 3.1 g/dL — ABNORMAL LOW (ref 3.5–5.0)
Alkaline Phosphatase: 60 U/L (ref 38–126)
Anion gap: 8 (ref 5–15)
BUN: 13 mg/dL (ref 6–20)
CO2: 25 mmol/L (ref 22–32)
Calcium: 8.4 mg/dL — ABNORMAL LOW (ref 8.9–10.3)
Chloride: 104 mmol/L (ref 98–111)
Creatinine: 0.66 mg/dL (ref 0.44–1.00)
GFR, Estimated: >60 mL/min (ref >60)
Glucose, Bld: 95 mg/dL (ref 70–99)
Potassium: 3.5 mmol/L (ref 3.5–5.1)
Sodium: 137 mmol/L (ref 135–145)
Total Bilirubin: 0.8 mg/dL (ref 0.0–1.2)
Total Protein: 7.1 g/dL (ref 6.5–8.1)

## 2024-02-01 LAB — CBC WITH DIFFERENTIAL (CANCER CENTER ONLY)
Abs Immature Granulocytes: 0.02 10*3/uL (ref 0.00–0.07)
Basophils Absolute: 0 10*3/uL (ref 0.0–0.1)
Basophils Relative: 1 %
Eosinophils Absolute: 0.1 10*3/uL (ref 0.0–0.5)
Eosinophils Relative: 4 %
HCT: 31.5 % — ABNORMAL LOW (ref 36.0–46.0)
Hemoglobin: 10.2 g/dL — ABNORMAL LOW (ref 12.0–15.0)
Immature Granulocytes: 1 %
Lymphocytes Relative: 24 %
Lymphs Abs: 0.8 10*3/uL (ref 0.7–4.0)
MCH: 30.4 pg (ref 26.0–34.0)
MCHC: 32.4 g/dL (ref 30.0–36.0)
MCV: 94 fL (ref 80.0–100.0)
Monocytes Absolute: 0.6 10*3/uL (ref 0.1–1.0)
Monocytes Relative: 18 %
Neutro Abs: 1.7 10*3/uL (ref 1.7–7.7)
Neutrophils Relative %: 52 %
Platelet Count: 149 10*3/uL — ABNORMAL LOW (ref 150–400)
RBC: 3.35 MIL/uL — ABNORMAL LOW (ref 3.87–5.11)
RDW: 16.5 % — ABNORMAL HIGH (ref 11.5–15.5)
WBC Count: 3.2 10*3/uL — ABNORMAL LOW (ref 4.0–10.5)
nRBC: 0 % (ref 0.0–0.2)

## 2024-02-01 LAB — TOTAL PROTEIN, URINE DIPSTICK: Protein, ur: NEGATIVE mg/dL

## 2024-02-01 MED ORDER — DEXTROSE 5 % IV SOLN
Freq: Once | INTRAVENOUS | Status: AC
Start: 2024-02-01 — End: 2024-02-01
  Filled 2024-02-01: qty 250

## 2024-02-01 MED ORDER — PALONOSETRON HCL INJECTION 0.25 MG/5ML
0.2500 mg | Freq: Once | INTRAVENOUS | Status: AC
  Administered 2024-02-01: 0.25 mg via INTRAVENOUS
  Filled 2024-02-01: qty 5

## 2024-02-01 MED ORDER — OXALIPLATIN CHEMO INJECTION 100 MG/20ML
85.0000 mg/m2 | Freq: Once | INTRAVENOUS | Status: AC
  Administered 2024-02-01: 150 mg via INTRAVENOUS
  Filled 2024-02-01: qty 25.85

## 2024-02-01 MED ORDER — FLUOROURACIL CHEMO INJECTION 2.5 GM/50ML
400.0000 mg/m2 | Freq: Once | INTRAVENOUS | Status: AC
Start: 2024-02-01 — End: 2024-02-01
  Administered 2024-02-01: 700 mg via INTRAVENOUS
  Filled 2024-02-01: qty 14

## 2024-02-01 MED ORDER — SODIUM CHLORIDE 0.9 % IV SOLN
5.3000 mg/kg | Freq: Once | INTRAVENOUS | Status: AC
  Administered 2024-02-01: 400 mg via INTRAVENOUS
  Filled 2024-02-01: qty 16

## 2024-02-01 MED ORDER — LEUCOVORIN CALCIUM INJECTION 350 MG
400.0000 mg/m2 | Freq: Once | INTRAMUSCULAR | Status: AC
  Administered 2024-02-01: 716 mg via INTRAVENOUS
  Filled 2024-02-01: qty 25

## 2024-02-01 MED ORDER — SODIUM CHLORIDE 0.9 % IV SOLN
Freq: Once | INTRAVENOUS | Status: AC
  Filled 2024-02-01: qty 250

## 2024-02-01 MED ORDER — SODIUM CHLORIDE 0.9 % IV SOLN
2400.0000 mg/m2 | INTRAVENOUS | Status: DC
  Administered 2024-02-01: 4300 mg via INTRAVENOUS
  Filled 2024-02-01: qty 86

## 2024-02-01 MED ORDER — DEXAMETHASONE SODIUM PHOSPHATE 10 MG/ML IJ SOLN
10.0000 mg | Freq: Once | INTRAMUSCULAR | Status: AC
  Administered 2024-02-01: 10 mg via INTRAVENOUS
  Filled 2024-02-01: qty 1

## 2024-02-01 NOTE — Progress Notes (Signed)
 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  INTERVAL HISTORY: Patient returns to clinic today for further evaluation and consideration of cycle 12 of FOLFOX.  She last received Avastin on November 23, 2023.  This has been on hold secondary to persistently elevated blood pressure.  She currently feels well and is asymptomatic.  She continues to tolerate her treatments well without significant side effects.  She does not complain of any weakness or fatigue. She denies any pain.  She denies any recent fevers or illnesses.  She has no neurologic complaints.  She has no chest pain, shortness of breath, cough, or hemoptysis.  She denies any nausea, vomiting, constipation, or diarrhea.  She has no melena or hematochezia.  She has no urinary complaints.  Patient offers no specific complaints today.  REVIEW OF SYSTEMS:   Review of Systems  Constitutional: Negative.  Negative for fever, malaise/fatigue and weight loss.  Respiratory: Negative.  Negative for cough, hemoptysis and shortness of breath.   Cardiovascular: Negative.  Negative for chest pain and leg swelling.  Gastrointestinal: Negative.  Negative for abdominal pain, blood in stool, constipation, diarrhea, melena, nausea and vomiting.  Genitourinary: Negative.  Negative for dysuria.  Musculoskeletal: Negative.  Negative for back pain.  Neurological: Negative.  Negative for dizziness, focal weakness, weakness and headaches.  Psychiatric/Behavioral: Negative.  The patient is not nervous/anxious.     As per HPI. Otherwise, a complete review of systems is negative.  PAST  MEDICAL HISTORY: Past Medical History:  Diagnosis Date   Abnormal uterine bleeding    Anemia    Anxiety    Asthma    Cardiomegaly    Cardiovascular disease    COPD (chronic obstructive pulmonary disease) (HCC)    Depression    Diabetes mellitus without complication (HCC)    Dyspnea    Goiter, nontoxic, multinodular    Heart murmur    Hypertension    Hypothyroidism    Lung cancer, primary, with metastasis from lung to other site [facility])    Malignant neoplasm of ascending colon (HCC)    Primary adenocarcinoma of ascending colon (HCC)    Syncopal episodes    Thyrotoxicosis    Uterine leiomyoma     PAST SURGICAL HISTORY: Past Surgical History:  Procedure Laterality Date   BIOPSY  08/12/2023   Procedure: BIOPSY;  Surgeon: Norma Fredrickson, Boykin Nearing, MD;  Location: [facility] ENDOSCOPY;  Service: Gastroenterology;;   CESAREAN SECTION     COLONOSCOPY N/A 08/12/2023   Procedure: COLONOSCOPY;  Surgeon: Toledo, Boykin Nearing, MD;  Location: [facility] ENDOSCOPY;  Service: Gastroenterology;  Laterality: N/A;  3rd patient   COLONOSCOPY WITH PROPOFOL N/A 10/29/2015   Procedure: COLONOSCOPY WITH PROPOFOL;  Surgeon: Elnita Maxwell, MD;  Location: [facility] ENDOSCOPY;  Service: Endoscopy;  Laterality: N/A;   DILITATION & CURRETTAGE/HYSTROSCOPY WITH NOVASURE ABLATION N/A 06/25/2018   Procedure: DILATATION & CURETTAGE/HYSTEROSCOPY WITH MINERVA ABLATION;  Surgeon: Hildred Laser, MD;  Location: [facility] ORS;  Service: Gynecology;  Laterality: N/A;   PORTACATH PLACEMENT N/A 08/26/2023   Procedure: INSERTION PORT-A-CATH;  Surgeon: Carolan Shiver, MD;  Location: [facility] ORS;  Service: General;  Laterality: N/A;   SUBMUCOSAL TATTOO INJECTION  08/12/2023  Procedure: SUBMUCOSAL TATTOO INJECTION;  Surgeon: Toledo, Boykin Nearing, MD;  Location: [facility] ENDOSCOPY;  Service: Gastroenterology;;   TUBAL LIGATION      FAMILY HISTORY: Family History  Adopted: Yes  Problem Relation Age of Onset   Breast cancer Neg Hx     ADVANCED  DIRECTIVES (Y/N):  N  HEALTH MAINTENANCE: Social History   Tobacco Use   Smoking status: Former    Current packs/day: 0.00    Types: Cigarettes    Quit date: 2024    Years since quitting: 1.2   Smokeless tobacco: Never  Vaping Use   Vaping status: Former  Substance Use Topics   Alcohol use: Never   Drug use: Never     Colonoscopy:  PAP:  Bone density:  Lipid panel:  No Known Allergies  Current Outpatient Medications  Medication Sig Dispense Refill   albuterol (PROVENTIL HFA;VENTOLIN HFA) 108 (90 Base) MCG/ACT inhaler Inhale 2 puffs into the lungs every 6 (six) hours as needed (for exercise induced asthma).     atorvastatin (LIPITOR) 10 MG tablet SMARTSIG:1 Tablet(s) By Mouth Every Evening     ezetimibe (ZETIA) 10 MG tablet Take 10 mg by mouth daily.     hydrALAZINE (APRESOLINE) 10 MG tablet Take 10 mg by mouth 3 (three) times daily as needed.     levothyroxine (SYNTHROID) 75 MCG tablet Take 75 mcg by mouth daily.     lidocaine-prilocaine (EMLA) cream Apply to affected area once 30 g 3   metFORMIN (GLUCOPHAGE) 500 MG tablet Take 500 mg by mouth 2 (two) times daily.      metoprolol tartrate (LOPRESSOR) 25 MG tablet Take 25 mg by mouth 2 (two) times daily.     Multiple Vitamins-Minerals (ALIVE ONCE DAILY WOMENS PO) Take 2 tablets by mouth daily. Gummies     ondansetron (ZOFRAN) 8 MG tablet Take 1 tablet (8 mg total) by mouth every 8 (eight) hours as needed for nausea or vomiting. Start on the third day after chemotherapy. 60 tablet 1   prochlorperazine (COMPAZINE) 10 MG tablet Take 1 tablet (10 mg total) by mouth every 6 (six) hours as needed for nausea or vomiting. 60 tablet 1   No current facility-administered medications for this visit.   Facility-Administered Medications Ordered in Other Visits  Medication Dose Route Frequency Provider Last Rate Last Admin   sodium chloride flush (NS) 0.9 % injection 10 mL  10 mL Intracatheter PRN Jeralyn Ruths, MD   10 mL at  12/09/23 1322    OBJECTIVE: Vitals:   02/01/24 0839  BP: (!) 167/86  Pulse: (!) 50  Resp: 16  Temp: 97.8 F (36.6 C)  SpO2: 100%       Body mass index is 28.94 kg/m.    ECOG FS:0 - Asymptomatic  General: Well-developed, well-nourished, no acute distress. Eyes: Pink conjunctiva, anicteric sclera. HEENT: Normocephalic, moist mucous membranes. Lungs: No audible wheezing or coughing. Heart: Regular rate and rhythm. Abdomen: Soft, nontender, no obvious distention. Musculoskeletal: No edema, cyanosis, or clubbing. Neuro: Alert, answering all questions appropriately. Cranial nerves grossly intact. Skin: No rashes or petechiae noted. Psych: Normal affect.  LAB RESULTS:  Lab Results  Component Value Date   NA 136 01/18/2024   K 3.7 01/18/2024   CL 104 01/18/2024   CO2 24 01/18/2024   GLUCOSE 129 (H) 01/18/2024   BUN 19 01/18/2024   CREATININE 0.77 01/18/2024   CALCIUM 8.5 (L) 01/18/2024   PROT 7.2 01/18/2024   ALBUMIN 3.2 (L) 01/18/2024   AST  34 01/18/2024   ALT 23 01/18/2024   ALKPHOS 59 01/18/2024   BILITOT 0.8 01/18/2024   GFRNONAA >60 01/18/2024   GFRAA >60 06/09/2018    Lab Results  Component Value Date   WBC 3.2 (L) 02/01/2024   NEUTROABS 1.7 02/01/2024   HGB 10.2 (L) 02/01/2024   HCT 31.5 (L) 02/01/2024   MCV 94.0 02/01/2024   PLT 149 (L) 02/01/2024     STUDIES: No results found.   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  Liver biopsy completed on August 06, 2023 confirmed diagnosis.  Patient's pretreatment CEA was 1780, but now has declined to 260.  Today's result is pending.  Repeat CT scan on November 30, 2023 reviewed independently with significant improvement of patient's disease burden.  Proceed with cycle 12 of FOLFOX plus Avastin  today.  Blood pressure is improved.  Return to clinic in 2 days for pump removal and then in 2 weeks for further evaluation and possible continuation of treatment.  Will get CT scan prior to next treatment to assess for interval change.  Uterine masses: Patient reports she has a history of benign fibroids.  Appreciate gynecology input.  Monitor.   Anemia: Chronic and unchanged.  Patient's hemoglobin is 10.2.   Leukopenia: Mildly improved.  Patient's ANC is 1.7.  Proceed with treatment as above. Thrombocytopenia: Mildly improved.  Patient platelet count is 149. Insomnia: Significantly improved.  Patient does not complain of this today. Coping/adjustment: Patient was previously given a referral to [facility]. Hypertension: Chronic and unchanged.  Continue monitoring and treatment per primary care.    Patient expressed understanding and was in agreement with this plan. She also understands that She can call clinic at any time with any questions, concerns, or complaints.    Cancer Staging  Primary adenocarcinoma of ascending colon [facility]) Staging form: Colon and Rectum, AJCC 8th Edition - Clinical stage from 08/17/2023: Stage IVB (cTX, cNX, pM1b) - Signed by Jeralyn Ruths, MD on 08/17/2023 Stage prefix: Initial diagnosis   Jeralyn Ruths, MD   02/01/2024 8:59 AM

## 2024-02-01 NOTE — Patient Instructions (Signed)

## 2024-02-01 NOTE — Progress Notes (Signed)
 Per Dr. Lora Paula to give Mvasi 400 mg today and keep 5FU injection at 700 mg with weight gain   Sharen Hones, PharmD, BCPS Clinical Pharmacist

## 2024-02-02 ENCOUNTER — Telehealth: Payer: Self-pay

## 2024-02-02 ENCOUNTER — Other Ambulatory Visit: Payer: Self-pay

## 2024-02-02 LAB — CEA: CEA: 237 ng/mL — ABNORMAL HIGH (ref 0.0–4.7)

## 2024-02-02 NOTE — Telephone Encounter (Signed)
 Completed STD physician statement faxed to [facility] at [phone].    Left msg on patient voicemail with update.

## 2024-02-02 NOTE — Telephone Encounter (Signed)
 [employer] long term disability clam form physician's statement received.

## 2024-02-03 ENCOUNTER — Inpatient Hospital Stay: Payer: BC Managed Care – PPO

## 2024-02-03 ENCOUNTER — Encounter: Payer: Self-pay | Admitting: Oncology

## 2024-02-03 VITALS — BP 173/95 | HR 68 | Temp 97.5°F | Resp 16

## 2024-02-03 DIAGNOSIS — Z5111 Encounter for antineoplastic chemotherapy: Secondary | ICD-10-CM | POA: Diagnosis not present

## 2024-02-03 DIAGNOSIS — C182 Malignant neoplasm of ascending colon: Secondary | ICD-10-CM

## 2024-02-03 MED ORDER — PROCHLORPERAZINE EDISYLATE 10 MG/2ML IJ SOLN
10.0000 mg | Freq: Once | INTRAMUSCULAR | Status: AC
  Administered 2024-02-03: 10 mg via INTRAVENOUS
  Filled 2024-02-03: qty 2

## 2024-02-03 MED ORDER — SODIUM CHLORIDE 0.9% FLUSH
10.0000 mL | INTRAVENOUS | Status: DC | PRN
  Filled 2024-02-03: qty 10

## 2024-02-03 MED ORDER — DEXAMETHASONE SODIUM PHOSPHATE 10 MG/ML IJ SOLN
10.0000 mg | Freq: Once | INTRAMUSCULAR | Status: AC
Start: 2024-02-03 — End: 2024-02-03
  Administered 2024-02-03: 10 mg via INTRAVENOUS
  Filled 2024-02-03: qty 1

## 2024-02-03 MED ORDER — HEPARIN SOD (PORK) LOCK FLUSH 100 UNIT/ML IV SOLN
500.0000 [IU] | Freq: Once | INTRAVENOUS | Status: AC | PRN
  Administered 2024-02-03: 500 [IU]
  Filled 2024-02-03: qty 5

## 2024-02-03 MED ORDER — SODIUM CHLORIDE 0.9 % IV SOLN
Freq: Once | INTRAVENOUS | Status: AC
  Filled 2024-02-03: qty 250

## 2024-02-03 MED ORDER — ONDANSETRON HCL 4 MG/2ML IJ SOLN
8.0000 mg | Freq: Once | INTRAMUSCULAR | Status: DC
Start: 2024-02-03 — End: 2024-02-03

## 2024-02-03 NOTE — Patient Instructions (Signed)
 Dehydration, Adult Dehydration is when you lose more fluids from the body than you take in. Vital organs like the kidneys, brain, and heart cannot function without a proper amount of fluids and salt. Any loss of fluids from the body can cause dehydration.  CAUSES   Vomiting.  Diarrhea.  Excessive sweating.  Excessive urine output.  Fever. SYMPTOMS  Mild dehydration  Thirst.  Dry lips.  Slightly dry mouth. Moderate dehydration  Very dry mouth.  Sunken eyes.  Skin does not bounce back quickly when lightly pinched and released.  Dark urine and decreased urine production.

## 2024-02-07 ENCOUNTER — Encounter
Admission: EM | Disposition: A | Discharge status: Home / Self Care | Payer: Self-pay | Source: Home / Self Care | Attending: Student

## 2024-02-07 ENCOUNTER — Other Ambulatory Visit: Payer: Self-pay

## 2024-02-07 ENCOUNTER — Emergency Department: Admitting: Registered Nurse

## 2024-02-07 ENCOUNTER — Emergency Department

## 2024-02-07 ENCOUNTER — Inpatient Hospital Stay

## 2024-02-07 ENCOUNTER — Inpatient Hospital Stay
Admission: EM | Admit: 2024-02-07 | Discharge: 2024-02-14 | DRG: 329 | Disposition: A | Discharge status: Home / Self Care | Attending: Internal Medicine | Admitting: Internal Medicine

## 2024-02-07 DIAGNOSIS — K567 Ileus, unspecified: Secondary | ICD-10-CM | POA: Diagnosis not present

## 2024-02-07 DIAGNOSIS — N179 Acute kidney failure, unspecified: Secondary | ICD-10-CM

## 2024-02-07 DIAGNOSIS — C787 Secondary malignant neoplasm of liver and intrahepatic bile duct: Secondary | ICD-10-CM | POA: Diagnosis present

## 2024-02-07 DIAGNOSIS — F32A Depression, unspecified: Secondary | ICD-10-CM | POA: Diagnosis present

## 2024-02-07 DIAGNOSIS — E1169 Type 2 diabetes mellitus with other specified complication: Secondary | ICD-10-CM

## 2024-02-07 DIAGNOSIS — K658 Other peritonitis: Secondary | ICD-10-CM | POA: Diagnosis present

## 2024-02-07 DIAGNOSIS — Z1152 Encounter for screening for COVID-19: Secondary | ICD-10-CM | POA: Diagnosis not present

## 2024-02-07 DIAGNOSIS — E871 Hypo-osmolality and hyponatremia: Secondary | ICD-10-CM | POA: Insufficient documentation

## 2024-02-07 DIAGNOSIS — Z1623 Resistance to quinolones and fluoroquinolones: Secondary | ICD-10-CM | POA: Diagnosis present

## 2024-02-07 DIAGNOSIS — B9620 Unspecified Escherichia coli [E. coli] as the cause of diseases classified elsewhere: Secondary | ICD-10-CM | POA: Diagnosis present

## 2024-02-07 DIAGNOSIS — K436 Other and unspecified ventral hernia with obstruction, without gangrene: Secondary | ICD-10-CM | POA: Diagnosis not present

## 2024-02-07 DIAGNOSIS — C7800 Secondary malignant neoplasm of unspecified lung: Secondary | ICD-10-CM | POA: Diagnosis present

## 2024-02-07 DIAGNOSIS — Z6829 Body mass index (BMI) 29.0-29.9, adult: Secondary | ICD-10-CM

## 2024-02-07 DIAGNOSIS — Z9851 Tubal ligation status: Secondary | ICD-10-CM

## 2024-02-07 DIAGNOSIS — I2489 Other forms of acute ischemic heart disease: Secondary | ICD-10-CM | POA: Diagnosis present

## 2024-02-07 DIAGNOSIS — F419 Anxiety disorder, unspecified: Secondary | ICD-10-CM | POA: Diagnosis present

## 2024-02-07 DIAGNOSIS — I10 Essential (primary) hypertension: Secondary | ICD-10-CM | POA: Diagnosis present

## 2024-02-07 DIAGNOSIS — E440 Moderate protein-calorie malnutrition: Secondary | ICD-10-CM | POA: Diagnosis present

## 2024-02-07 DIAGNOSIS — E785 Hyperlipidemia, unspecified: Secondary | ICD-10-CM | POA: Diagnosis present

## 2024-02-07 DIAGNOSIS — N858 Other specified noninflammatory disorders of uterus: Secondary | ICD-10-CM | POA: Insufficient documentation

## 2024-02-07 DIAGNOSIS — I2510 Atherosclerotic heart disease of native coronary artery without angina pectoris: Secondary | ICD-10-CM | POA: Diagnosis present

## 2024-02-07 DIAGNOSIS — R7989 Other specified abnormal findings of blood chemistry: Secondary | ICD-10-CM

## 2024-02-07 DIAGNOSIS — E1151 Type 2 diabetes mellitus with diabetic peripheral angiopathy without gangrene: Secondary | ICD-10-CM | POA: Diagnosis present

## 2024-02-07 DIAGNOSIS — Z79890 Hormone replacement therapy (postmenopausal): Secondary | ICD-10-CM

## 2024-02-07 DIAGNOSIS — Z9889 Other specified postprocedural states: Secondary | ICD-10-CM

## 2024-02-07 DIAGNOSIS — C182 Malignant neoplasm of ascending colon: Secondary | ICD-10-CM | POA: Diagnosis present

## 2024-02-07 DIAGNOSIS — I700 Atherosclerosis of aorta: Secondary | ICD-10-CM | POA: Diagnosis present

## 2024-02-07 DIAGNOSIS — Z85118 Personal history of other malignant neoplasm of bronchus and lung: Secondary | ICD-10-CM

## 2024-02-07 DIAGNOSIS — K631 Perforation of intestine (nontraumatic): Secondary | ICD-10-CM | POA: Diagnosis present

## 2024-02-07 DIAGNOSIS — E876 Hypokalemia: Secondary | ICD-10-CM | POA: Diagnosis not present

## 2024-02-07 DIAGNOSIS — K5652 Intestinal adhesions [bands] with complete obstruction: Secondary | ICD-10-CM | POA: Diagnosis not present

## 2024-02-07 DIAGNOSIS — K0889 Other specified disorders of teeth and supporting structures: Secondary | ICD-10-CM | POA: Diagnosis present

## 2024-02-07 DIAGNOSIS — E0520 Thyrotoxicosis with toxic multinodular goiter without thyrotoxic crisis or storm: Secondary | ICD-10-CM | POA: Diagnosis present

## 2024-02-07 DIAGNOSIS — E039 Hypothyroidism, unspecified: Secondary | ICD-10-CM | POA: Insufficient documentation

## 2024-02-07 DIAGNOSIS — Z87891 Personal history of nicotine dependence: Secondary | ICD-10-CM

## 2024-02-07 DIAGNOSIS — E119 Type 2 diabetes mellitus without complications: Secondary | ICD-10-CM

## 2024-02-07 DIAGNOSIS — Z8709 Personal history of other diseases of the respiratory system: Secondary | ICD-10-CM

## 2024-02-07 DIAGNOSIS — J4489 Other specified chronic obstructive pulmonary disease: Secondary | ICD-10-CM | POA: Diagnosis present

## 2024-02-07 DIAGNOSIS — R100 Acute abdomen: Secondary | ICD-10-CM | POA: Diagnosis present

## 2024-02-07 DIAGNOSIS — Z7984 Long term (current) use of oral hypoglycemic drugs: Secondary | ICD-10-CM

## 2024-02-07 DIAGNOSIS — K56609 Unspecified intestinal obstruction, unspecified as to partial versus complete obstruction: Secondary | ICD-10-CM

## 2024-02-07 DIAGNOSIS — D259 Leiomyoma of uterus, unspecified: Secondary | ICD-10-CM | POA: Diagnosis present

## 2024-02-07 DIAGNOSIS — Z79899 Other long term (current) drug therapy: Secondary | ICD-10-CM

## 2024-02-07 DIAGNOSIS — R011 Cardiac murmur, unspecified: Secondary | ICD-10-CM | POA: Diagnosis present

## 2024-02-07 DIAGNOSIS — R918 Other nonspecific abnormal finding of lung field: Secondary | ICD-10-CM | POA: Insufficient documentation

## 2024-02-07 DIAGNOSIS — E860 Dehydration: Secondary | ICD-10-CM | POA: Diagnosis present

## 2024-02-07 DIAGNOSIS — Z98891 History of uterine scar from previous surgery: Secondary | ICD-10-CM

## 2024-02-07 DIAGNOSIS — K460 Unspecified abdominal hernia with obstruction, without gangrene: Secondary | ICD-10-CM

## 2024-02-07 DIAGNOSIS — K429 Umbilical hernia without obstruction or gangrene: Secondary | ICD-10-CM | POA: Diagnosis present

## 2024-02-07 DIAGNOSIS — R17 Unspecified jaundice: Secondary | ICD-10-CM | POA: Insufficient documentation

## 2024-02-07 DIAGNOSIS — C189 Malignant neoplasm of colon, unspecified: Secondary | ICD-10-CM | POA: Insufficient documentation

## 2024-02-07 DIAGNOSIS — Z9221 Personal history of antineoplastic chemotherapy: Secondary | ICD-10-CM

## 2024-02-07 HISTORY — PX: LAPAROTOMY: SHX154

## 2024-02-07 LAB — COMPREHENSIVE METABOLIC PANEL WITH GFR
ALT: 26 U/L (ref 0–44)
AST: 32 U/L (ref 15–41)
Albumin: 3.8 g/dL (ref 3.5–5.0)
Alkaline Phosphatase: 71 U/L (ref 38–126)
Anion gap: 22 — ABNORMAL HIGH (ref 5–15)
BUN: 70 mg/dL — ABNORMAL HIGH (ref 6–20)
CO2: 21 mmol/L — ABNORMAL LOW (ref 22–32)
Calcium: 9.1 mg/dL (ref 8.9–10.3)
Chloride: 91 mmol/L — ABNORMAL LOW (ref 98–111)
Creatinine, Ser: 1.77 mg/dL — ABNORMAL HIGH (ref 0.44–1.00)
GFR, Estimated: 33 mL/min — ABNORMAL LOW (ref >60)
Glucose, Bld: 160 mg/dL — ABNORMAL HIGH (ref 70–99)
Potassium: 3.7 mmol/L (ref 3.5–5.1)
Sodium: 134 mmol/L — ABNORMAL LOW (ref 135–145)
Total Bilirubin: 1.3 mg/dL — ABNORMAL HIGH (ref 0.0–1.2)
Total Protein: 8.8 g/dL — ABNORMAL HIGH (ref 6.5–8.1)

## 2024-02-07 LAB — RESP PANEL BY RT-PCR (RSV, FLU A&B, COVID)  RVPGX2
Influenza A by PCR: NEGATIVE
Influenza B by PCR: NEGATIVE
Resp Syncytial Virus by PCR: NEGATIVE
SARS Coronavirus 2 by RT PCR: NEGATIVE

## 2024-02-07 LAB — CBC
HCT: 43.8 % (ref 36.0–46.0)
Hemoglobin: 14.5 g/dL (ref 12.0–15.0)
MCH: 30 pg (ref 26.0–34.0)
MCHC: 33.1 g/dL (ref 30.0–36.0)
MCV: 90.5 fL (ref 80.0–100.0)
Platelets: 255 10*3/uL (ref 150–400)
RBC: 4.84 MIL/uL (ref 3.87–5.11)
RDW: 16.3 % — ABNORMAL HIGH (ref 11.5–15.5)
WBC: 7 10*3/uL (ref 4.0–10.5)
nRBC: 0.9 % — ABNORMAL HIGH (ref 0.0–0.2)

## 2024-02-07 LAB — LIPASE, BLOOD: Lipase: 29 U/L (ref 11–51)

## 2024-02-07 LAB — TROPONIN I (HIGH SENSITIVITY)
Troponin I (High Sensitivity): 89 ng/L — ABNORMAL HIGH (ref <18)
Troponin I (High Sensitivity): 84 ng/L — ABNORMAL HIGH (ref <18)

## 2024-02-07 LAB — LACTIC ACID, PLASMA
Lactic Acid, Venous: 1.7 mmol/L (ref 0.5–1.9)
Lactic Acid, Venous: 1.5 mmol/L (ref 0.5–1.9)

## 2024-02-07 SURGERY — LAPAROTOMY, EXPLORATORY
Anesthesia: General

## 2024-02-07 MED ORDER — LACTATED RINGERS IV SOLN: INTRAVENOUS | Status: AC

## 2024-02-07 MED ORDER — PIPERACILLIN-TAZOBACTAM 3.375 G IVPB
3.3750 g | Freq: Three times a day (TID) | INTRAVENOUS | Status: DC
  Administered 2024-02-07 – 2024-02-12 (×14): 3.375 g via INTRAVENOUS
  Filled 2024-02-07 (×14): qty 50

## 2024-02-07 MED ORDER — SUCCINYLCHOLINE CHLORIDE 200 MG/10ML IV SOSY
PREFILLED_SYRINGE | INTRAVENOUS | Status: AC
Start: 2024-02-07 — End: ?
  Filled 2024-02-07: qty 10

## 2024-02-07 MED ORDER — HEPARIN SODIUM (PORCINE) 5000 UNIT/ML IJ SOLN
5000.0000 [IU] | Freq: Three times a day (TID) | INTRAMUSCULAR | Status: DC
  Administered 2024-02-08 – 2024-02-14 (×19): 5000 [IU] via SUBCUTANEOUS
  Filled 2024-02-07 (×19): qty 1

## 2024-02-07 MED ORDER — FENTANYL CITRATE (PF) 100 MCG/2ML IJ SOLN
INTRAMUSCULAR | Status: AC
  Filled 2024-02-07: qty 2

## 2024-02-07 MED ORDER — SODIUM CHLORIDE 0.9 % IV BOLUS
1000.0000 mL | Freq: Once | INTRAVENOUS | Status: AC
  Administered 2024-02-07: 1000 mL via INTRAVENOUS

## 2024-02-07 MED ORDER — ONDANSETRON HCL 4 MG/2ML IJ SOLN
4.0000 mg | Freq: Once | INTRAMUSCULAR | Status: AC
  Administered 2024-02-07: 4 mg via INTRAVENOUS
  Filled 2024-02-07: qty 2

## 2024-02-07 MED ORDER — INSULIN ASPART 100 UNIT/ML IJ SOLN
0.0000 [IU] | INTRAMUSCULAR | Status: DC
  Administered 2024-02-08 – 2024-02-11 (×6): 1 [IU] via SUBCUTANEOUS
  Administered 2024-02-11 (×2): 2 [IU] via SUBCUTANEOUS
  Administered 2024-02-12: 1 [IU] via SUBCUTANEOUS
  Administered 2024-02-12: 2 [IU] via SUBCUTANEOUS
  Administered 2024-02-12 (×3): 1 [IU] via SUBCUTANEOUS
  Administered 2024-02-13: 2 [IU] via SUBCUTANEOUS
  Administered 2024-02-13: 1 [IU] via SUBCUTANEOUS
  Administered 2024-02-13: 2 [IU] via SUBCUTANEOUS
  Filled 2024-02-07 (×16): qty 1

## 2024-02-07 MED ORDER — BUPIVACAINE LIPOSOME 1.3 % IJ SUSP
INTRAMUSCULAR | Status: AC
  Filled 2024-02-07: qty 20

## 2024-02-07 MED ORDER — ONDANSETRON HCL 4 MG/2ML IJ SOLN
INTRAMUSCULAR | Status: AC
  Filled 2024-02-07: qty 2

## 2024-02-07 MED ORDER — FENTANYL CITRATE (PF) 100 MCG/2ML IJ SOLN
INTRAMUSCULAR | Status: DC | PRN
  Administered 2024-02-07 (×2): 50 ug via INTRAVENOUS

## 2024-02-07 MED ORDER — MIDAZOLAM HCL 2 MG/2ML IJ SOLN
INTRAMUSCULAR | Status: AC
  Filled 2024-02-07: qty 2

## 2024-02-07 MED ORDER — SODIUM CHLORIDE 0.9% IV SOLUTION: Freq: Once | INTRAVENOUS | Status: DC

## 2024-02-07 MED ORDER — SUCCINYLCHOLINE CHLORIDE 200 MG/10ML IV SOSY
PREFILLED_SYRINGE | INTRAVENOUS | Status: DC | PRN
  Administered 2024-02-07: 80 mg via INTRAVENOUS

## 2024-02-07 MED ORDER — CEFAZOLIN SODIUM-DEXTROSE 2-3 GM-%(50ML) IV SOLR
INTRAVENOUS | Status: DC | PRN
  Administered 2024-02-07: 2 g via INTRAVENOUS

## 2024-02-07 MED ORDER — LIDOCAINE HCL (CARDIAC) PF 100 MG/5ML IV SOSY
PREFILLED_SYRINGE | INTRAVENOUS | Status: DC | PRN
  Administered 2024-02-07: 80 mg via INTRAVENOUS

## 2024-02-07 MED ORDER — DEXAMETHASONE SODIUM PHOSPHATE 10 MG/ML IJ SOLN
INTRAMUSCULAR | Status: AC
  Filled 2024-02-07: qty 1

## 2024-02-07 MED ORDER — MORPHINE SULFATE (PF) 4 MG/ML IV SOLN
4.0000 mg | Freq: Once | INTRAVENOUS | Status: AC
  Administered 2024-02-07: 4 mg via INTRAVENOUS
  Filled 2024-02-07: qty 1

## 2024-02-07 MED ORDER — ROCURONIUM BROMIDE 100 MG/10ML IV SOLN
INTRAVENOUS | Status: DC | PRN
  Administered 2024-02-07: 30 mg via INTRAVENOUS

## 2024-02-07 MED ORDER — SODIUM CHLORIDE (PF) 0.9 % IJ SOLN
INTRAMUSCULAR | Status: DC | PRN
  Administered 2024-02-08: 100 mL via SUBCUTANEOUS

## 2024-02-07 MED ORDER — DEXAMETHASONE SODIUM PHOSPHATE 10 MG/ML IJ SOLN
INTRAMUSCULAR | Status: DC | PRN
  Administered 2024-02-07: 10 mg via INTRAVENOUS

## 2024-02-07 MED ORDER — BUPIVACAINE-EPINEPHRINE (PF) 0.5% -1:200000 IJ SOLN
INTRAMUSCULAR | Status: AC
Start: 2024-02-07 — End: ?
  Filled 2024-02-07: qty 30

## 2024-02-07 MED ORDER — ONDANSETRON HCL 4 MG/2ML IJ SOLN
4.0000 mg | Freq: Four times a day (QID) | INTRAMUSCULAR | Status: DC | PRN
Start: 2024-02-07 — End: 2024-02-14

## 2024-02-07 MED ORDER — SODIUM CHLORIDE 0.9 % IV SOLN: INTRAVENOUS | Status: DC | PRN

## 2024-02-07 MED ORDER — LACTATED RINGERS IV BOLUS
1000.0000 mL | Freq: Once | INTRAVENOUS | Status: AC
  Administered 2024-02-07: 1000 mL via INTRAVENOUS

## 2024-02-07 MED ORDER — PROPOFOL 10 MG/ML IV BOLUS
INTRAVENOUS | Status: DC | PRN
  Administered 2024-02-07: 150 mg via INTRAVENOUS

## 2024-02-07 MED ORDER — IOHEXOL 300 MG/ML  SOLN
80.0000 mL | Freq: Once | INTRAMUSCULAR | Status: AC | PRN
  Administered 2024-02-07: 80 mL via INTRAVENOUS

## 2024-02-07 MED ORDER — PROPOFOL 10 MG/ML IV BOLUS
INTRAVENOUS | Status: AC
  Filled 2024-02-07: qty 20

## 2024-02-07 MED ORDER — LIDOCAINE HCL (PF) 2 % IJ SOLN
INTRAMUSCULAR | Status: AC
  Filled 2024-02-07: qty 5

## 2024-02-07 MED ORDER — ALBUTEROL SULFATE (2.5 MG/3ML) 0.083% IN NEBU: 2.5000 mg | INHALATION_SOLUTION | RESPIRATORY_TRACT | Status: DC | PRN

## 2024-02-07 MED ORDER — MIDAZOLAM HCL 2 MG/2ML IJ SOLN
INTRAMUSCULAR | Status: DC | PRN
  Administered 2024-02-07: 2 mg via INTRAVENOUS

## 2024-02-07 MED ORDER — SODIUM CHLORIDE (PF) 0.9 % IJ SOLN
INTRAMUSCULAR | Status: AC
Start: 2024-02-07 — End: ?
  Filled 2024-02-07: qty 50

## 2024-02-07 MED ORDER — ACETAMINOPHEN 10 MG/ML IV SOLN
INTRAVENOUS | Status: AC
Start: 2024-02-07 — End: ?
  Filled 2024-02-07: qty 100

## 2024-02-07 MED ORDER — ROCURONIUM BROMIDE 10 MG/ML (PF) SYRINGE
PREFILLED_SYRINGE | INTRAVENOUS | Status: AC
  Filled 2024-02-07: qty 10

## 2024-02-07 SURGICAL SUPPLY — 48 items
APPLIER CLIP 11 MED OPEN (CLIP) IMPLANT
APPLIER CLIP 13 LRG OPEN (CLIP) IMPLANT
BARRIER ADH SEPRAFILM 3INX5IN (MISCELLANEOUS) IMPLANT
BLADE CLIPPER SURG (BLADE) ×1 IMPLANT
BNDG GAUZE DERMACEA FLUFF 4 (GAUZE/BANDAGES/DRESSINGS) IMPLANT
CHLORAPREP W/TINT 26 (MISCELLANEOUS) ×1 IMPLANT
CLIP APPLIE 11 MED OPEN (CLIP) IMPLANT
CLIP APPLIE 13 LRG OPEN (CLIP) IMPLANT
DRAIN CHANNEL JP 19F RND 3/16 (MISCELLANEOUS) IMPLANT
DRAPE LAPAROTOMY 100X77 ABD (DRAPES) ×1 IMPLANT
DRAPE TABLE BACK 80X90 (DRAPES) IMPLANT
DRAPE WARM FLUID 44X44 (DRAPES) IMPLANT
DRSG OPSITE POSTOP 4X10 (GAUZE/BANDAGES/DRESSINGS) IMPLANT
DRSG OPSITE POSTOP 4X12 (GAUZE/BANDAGES/DRESSINGS) IMPLANT
ELECT BLADE 6.5 EXT (BLADE) ×1 IMPLANT
ELECT REM PT RETURN 9FT ADLT (ELECTROSURGICAL) ×1 IMPLANT
ELECTRODE REM PT RTRN 9FT ADLT (ELECTROSURGICAL) ×1 IMPLANT
EVACUATOR SILICONE 100CC (DRAIN) IMPLANT
GLOVE BIO SURGEON STRL SZ7 (GLOVE) ×2 IMPLANT
GOWN STRL REUS W/ TWL LRG LVL3 (GOWN DISPOSABLE) ×2 IMPLANT
JET LAVAGE IRRISEPT WOUND (IRRIGATION / IRRIGATOR) IMPLANT
LAVAGE JET IRRISEPT WOUND (IRRIGATION / IRRIGATOR) IMPLANT
LIGASURE IMPACT 36 18CM CVD LR (INSTRUMENTS) IMPLANT
MANIFOLD NEPTUNE II (INSTRUMENTS) ×1 IMPLANT
NDL HYPO 22X1.5 SAFETY MO (MISCELLANEOUS) ×2 IMPLANT
NEEDLE HYPO 22X1.5 SAFETY MO (MISCELLANEOUS) ×2 IMPLANT
PACK BASIN MAJOR ARMC (MISCELLANEOUS) ×1 IMPLANT
RELOAD PROXIMATE 75MM BLUE (ENDOMECHANICALS) ×3 IMPLANT
RELOAD STAPLE 75 3.8 BLU REG (ENDOMECHANICALS) IMPLANT
SPONGE T-LAP 18X18 ~~LOC~~+RFID (SPONGE) ×1 IMPLANT
SPONGE T-LAP 18X36 ~~LOC~~+RFID STR (SPONGE) IMPLANT
STAPLER PROXIMATE 75MM BLUE (STAPLE) IMPLANT
STAPLER SKIN PROX 35W (STAPLE) ×1 IMPLANT
SUT ETHIBOND 0 MO6 C/R (SUTURE) IMPLANT
SUT ETHILON 3-0 FS-10 30 BLK (SUTURE) ×1 IMPLANT
SUT PDS AB 0 CT1 27 (SUTURE) ×3 IMPLANT
SUT SILK 2 0 SH CR/8 (SUTURE) ×1 IMPLANT
SUT SILK 2 0SH CR/8 30 (SUTURE) ×1 IMPLANT
SUT SILK 2-0 18XBRD TIE 12 (SUTURE) ×1 IMPLANT
SUT VIC AB 0 CT1 36 (SUTURE) ×2 IMPLANT
SUT VIC AB 2-0 SH 27XBRD (SUTURE) IMPLANT
SUT VIC AB 3-0 SH 27X BRD (SUTURE) ×1 IMPLANT
SUTURE EHLN 3-0 FS-10 30 BLK (SUTURE) IMPLANT
SYR 20ML LL LF (SYRINGE) ×2 IMPLANT
SYR 3ML LL SCALE MARK (SYRINGE) ×1 IMPLANT
TRAP FLUID SMOKE EVACUATOR (MISCELLANEOUS) ×1 IMPLANT
TRAY FOLEY MTR SLVR 16FR STAT (SET/KITS/TRAYS/PACK) ×1 IMPLANT
WATER STERILE IRR 500ML POUR (IV SOLUTION) ×1 IMPLANT

## 2024-02-07 NOTE — Assessment & Plan Note (Signed)
    Elevated bilirubin I have ordered a repeat for the a.m. given her multiple intra-abdominal processes

## 2024-02-07 NOTE — Assessment & Plan Note (Signed)
    Type 2 diabetes mellitus Hold home metformin and place on every 4 sliding scale insulin while n.p.o.

## 2024-02-07 NOTE — Assessment & Plan Note (Signed)
 Elevated troponin Anticipate this is in the setting of the acute kidney injury or type II in the setting of increased demand and of not of primary cardiac significance, initial troponin 89 CT: Aortic atherosclerosis. We will hold off aspirin given plan for surgery repeat serial troponins and obtain echocardiogram for confirmation Dependent review and interpretation of the patient's twelve-lead ECG is sinus rhythm 98 ventricular rate PR interval 120 QTc 426 with anterior lateral ST depressions, given her lack of chest pain and the history provided this is a probable visceral cardiac reflex

## 2024-02-07 NOTE — Progress Notes (Signed)
 Pharmacy Antibiotic Note  Alexis Garcia is a 63 y.o. female admitted on 02/07/2024 with  intra-abdominal infection .  Pharmacy has been consulted for Zosyn dosing.  Plan: Zosyn 3.375g IV q8h (4 hour infusion). Follow up renal function, cultures, LOT De-escalate antibiotics as clinically indicated  Height: 5\' 6"  (167.6 cm) Weight: 81.2 kg (179 lb 0.2 oz) IBW/kg (Calculated) : 59.3  Temp (24hrs), Avg:97.7 F (36.5 C), Min:97.7 F (36.5 C), Max:97.7 F (36.5 C)  Recent Labs  Lab 02/01/24 0827 02/07/24 1807 02/07/24 2006  WBC 3.2* 7.0  --   CREATININE 0.66 1.77*  --   LATICACIDVEN  --   --  1.7    Estimated Creatinine Clearance: 36.3 mL/min (A) (by C-G formula based on SCr of 1.77 mg/dL (H)).    No Known Allergies  Antimicrobials this admission: Zosyn 3/30 >>    Thank you for allowing pharmacy to be a part of this patient's care.  Jerrilyn Cairo, PharmD 02/07/2024 9:10 PM

## 2024-02-07 NOTE — ED Notes (Signed)
Pt changed into gown for OR.

## 2024-02-07 NOTE — Assessment & Plan Note (Signed)
  Acute kidney injury Creatinine 1.77 increased from 0.66 3/24 Suspect this is primarily prerenal due to her nausea and vomiting since Wednesday however the presence of a large periumbilical hernia raises the specter of postobstructive etiology as well in addition to her known malignancy We will obtain a retroperitoneal ultrasound to rule out hydronephrosis KV the need for any stenting, obtain Foley insertion to assist with outlet in the setting of the large umbilical hernia, lactic acid is 1.7 and received 1 L of crystalloid already we will provide crystalloid maintenance and pending the outcome of the retroperitoneal ultrasound consult urology if hydro present, follow serial BMP

## 2024-02-07 NOTE — Assessment & Plan Note (Signed)
  Essential hypertension I have held her home oral antihypertensives given n.p.o. If she remains stable in the setting of her acute illness can introduce IV as needed antihypertensives as needed

## 2024-02-07 NOTE — Consult Note (Signed)
 Patient ID: Alexis Garcia, female   DOB: 09-28-1961, 63 y.o.   MRN: [id number]  HPI Alexis Garcia is a 63 y.o. female seen in consultation at the request of Dr. Mohammed Kindle.  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  She has been on chemotherapy since October.  Last chemo was on 26 March.  The last three days he has been experiencing some umbilical discomfort at the hernia site.  She now has pain that is moderate intensity sharp and worsening with movement.  Has been constipated. She did have a CT scan that I have personally reviewed showing evidence of incarcerated ventral hernia that is complex and large.  There is also evidence of perforation and bowel obstruction.  Patient to that this progressive liver and pulmonary nodules. She does have baseline history of COPD, diabetes, Hemoglobin of 14 crit of 43 white count of 7 normal platelets.  Creatinine is 1.77 with a BUN of 70 HPI  Past Medical History:  Diagnosis Date   Abnormal uterine bleeding    Anemia    Anxiety    Asthma    Cardiomegaly    Cardiovascular disease    COPD (chronic obstructive pulmonary disease) (HCC)    Depression    Diabetes mellitus without complication (HCC)    Dyspnea    Goiter, nontoxic, multinodular    Heart murmur    Hypertension    Hypothyroidism    Lung cancer, primary, with metastasis from lung to other site [facility])    Malignant neoplasm of ascending colon (HCC)    Primary adenocarcinoma of ascending colon (HCC)    Syncopal episodes    Thyrotoxicosis    Uterine leiomyoma     Past Surgical History:  Procedure Laterality Date   BIOPSY  08/12/2023   Procedure: BIOPSY;  Surgeon: Norma Fredrickson, Boykin Nearing, MD;  Location: [facility] ENDOSCOPY;  Service: Gastroenterology;;   CESAREAN SECTION     COLONOSCOPY N/A 08/12/2023   Procedure: COLONOSCOPY;  Surgeon: Toledo, Boykin Nearing, MD;  Location: [facility] ENDOSCOPY;  Service: Gastroenterology;  Laterality: N/A;  3rd patient    COLONOSCOPY WITH PROPOFOL N/A 10/29/2015   Procedure: COLONOSCOPY WITH PROPOFOL;  Surgeon: Elnita Maxwell, MD;  Location: [facility] ENDOSCOPY;  Service: Endoscopy;  Laterality: N/A;   DILITATION & CURRETTAGE/HYSTROSCOPY WITH NOVASURE ABLATION N/A 06/25/2018   Procedure: DILATATION & CURETTAGE/HYSTEROSCOPY WITH MINERVA ABLATION;  Surgeon: Hildred Laser, MD;  Location: [facility] ORS;  Service: Gynecology;  Laterality: N/A;   PORTACATH PLACEMENT N/A 08/26/2023   Procedure: INSERTION PORT-A-CATH;  Surgeon: Carolan Shiver, MD;  Location: [facility] ORS;  Service: General;  Laterality: N/A;   SUBMUCOSAL TATTOO INJECTION  08/12/2023   Procedure: SUBMUCOSAL TATTOO INJECTION;  Surgeon: Toledo, Boykin Nearing, MD;  Location: [facility] ENDOSCOPY;  Service: Gastroenterology;;   TUBAL LIGATION      Family History  Adopted: Yes  Problem Relation Age of Onset   Breast cancer Neg Hx     Social History Social History   Tobacco Use   Smoking status: Former    Current packs/day: 0.00    Types: Cigarettes    Quit date: 2024    Years since quitting: 1.2   Smokeless tobacco: Never  Vaping Use   Vaping status: Former  Substance Use Topics   Alcohol use: Never   Drug use: Never    No Known Allergies  Current Facility-Administered Medications  Medication Dose Route Frequency Provider Last Rate Last Admin   0.9 %  sodium chloride infusion (Manually program via Guardrails IV Fluids)   Intravenous Once Tan, Franchot Erichsen, MD       albuterol (PROVENTIL) (2.5 MG/3ML) 0.083% nebulizer solution 2.5 mg  2.5 mg Nebulization Q4H PRN Core, Doy Hutching, MD       heparin injection 5,000 Units  5,000 Units Subcutaneous Q8H Core, Doy Hutching, MD       [START ON 02/08/2024] insulin aspart (novoLOG) injection 0-9 Units  0-9 Units Subcutaneous Q4H Core, Doy Hutching, MD       lactated ringers infusion   Intravenous Continuous Core, Doy Hutching, MD       ondansetron [facility]) injection 4 mg  4 mg Intravenous Q6H PRN Core, Doy Hutching, MD        piperacillin-tazobactam (ZOSYN) IVPB 3.375 g  3.375 g Intravenous Q8H Netta Neat, [facility]   Stopped at 02/07/24 2212   Current Outpatient Medications  Medication Sig Dispense Refill   albuterol (PROVENTIL HFA;VENTOLIN HFA) 108 (90 Base) MCG/ACT inhaler Inhale 2 puffs into the lungs every 6 (six) hours as needed (for exercise induced asthma).     atorvastatin (LIPITOR) 10 MG tablet SMARTSIG:1 Tablet(s) By Mouth Every Evening     ezetimibe (ZETIA) 10 MG tablet Take 10 mg by mouth daily.     hydrALAZINE (APRESOLINE) 10 MG tablet Take 10 mg by mouth 3 (three) times daily as needed.     levothyroxine (SYNTHROID) 75 MCG tablet Take 75 mcg by mouth daily.     lidocaine-prilocaine (EMLA) cream Apply to affected area once 30 g 3   metFORMIN (GLUCOPHAGE) 500 MG tablet Take 500 mg by mouth 2 (two) times daily.      metoprolol tartrate (LOPRESSOR) 25 MG tablet Take 25 mg by mouth 2 (two) times daily.     Multiple Vitamins-Minerals (ALIVE ONCE DAILY WOMENS PO) Take 2 tablets by mouth daily. Gummies     ondansetron (ZOFRAN) 8 MG tablet Take 1 tablet (8 mg total) by mouth every 8 (eight) hours as needed for nausea or vomiting. Start on the third day after chemotherapy. 60 tablet 1   prochlorperazine (COMPAZINE) 10 MG tablet Take 1 tablet (10 mg total) by mouth every 6 (six) hours as needed for nausea or vomiting. 60 tablet 1   Facility-Administered Medications Ordered in Other Encounters  Medication Dose Route Frequency Provider Last Rate Last Admin   sodium chloride flush (NS) 0.9 % injection 10 mL  10 mL Intracatheter PRN Jeralyn Ruths, MD   10 mL at 12/09/23 1322     Review of Systems Full ROS  was asked and was negative except for the information on the HPI  Physical Exam Blood pressure (!) 145/99, pulse 83, temperature 97.7 F (36.5 C), temperature source Oral, resp. rate 16, height 5\' 6"  (1.676 m), weight 81.2 kg, SpO2 97%. CONSTITUTIONAL: debilitated and chronically ill, alopecia chemo   induced. EYES: Pupils are equal, round, and reactive to light, Sclera are non-icteric. EARS, NOSE, MOUTH AND THROAT: The oropharynx is clear. The oral mucosa is pink and moist. Hearing is intact to voice. LYMPH NODES:  Lymph nodes in the neck are normal. RESPIRATORY:  Lungs are clear. There is normal respiratory effort, with equal breath sounds bilaterally, and without pathologic use of accessory muscles. CARDIOVASCULAR: Heart is regular without murmurs, gallops, or rubs. GI: The abdomen is ,tender to palpation w exquisite tenderness on non reducible ventral hernia, there is erythema and significant inflammation. There is no hepatosplenomegaly.  GU: Rectal deferred.   MUSCULOSKELETAL: Normal  muscle strength and tone. No cyanosis or edema.   SKIN: Turgor is good and there are no pathologic skin lesions or ulcers. NEUROLOGIC: Motor and sensation is grossly normal. Cranial nerves are grossly intact. PSYCH:  Oriented to person, place and time. Affect is normal.  Data Reviewed  I have personally reviewed the patient's imaging, laboratory findings and medical records.    Assessment/Plan  63 year old female with a stage IV colon cancer with metastasis to the liver and the lung with progressive disease with new metastasis to the liver and the lung presents now with an incarcerated large and complex ventral hernia with bowel perforation and obstruction.  Very difficult situation.  I had a good discussion with the patient and the daughter regarding her disease process.  Unfortunately she is currently with end-stage cancer and with recent chemotherapy puts her at very high risk for perioperative morbidity and mortality.  We discussed the risk of bleeding, infection, prolonged hospitalization, evisceration, chronic pain, bowel injuries, recurrences.   We will start aggressive fluid resuscitation antibiotic therapy NG placement and  perform AN emergent surgical intervention SHe is also clear that this is a  palliative surgery give her a chance to prolong her life for probably a couple months.  But she also understands that she might not make it. I had an extensive discussion with the patient and the family.  Please note the spent 75 minutes in this encounter including extensive review of medical records, personally reviewing imaging studies, coordinating her care, placing orders and performing documentation  Sterling Big, MD FACS General Surgeon 02/07/2024, 10:33 PM

## 2024-02-07 NOTE — ED Provider Notes (Signed)
 [facility] Provider Note    Event Date/Time   First MD Initiated Contact with Patient 02/07/24 1933     (approximate)   History   Dehydration   HPI  Alexis Garcia is a 63 y.o. female with history of COPD, diabetes, colon cancer, umbilical hernia, presenting with nausea, vomiting.  States that she has an umbilical hernia that she is typically able to reduce, since Wednesday she has not been able to reduce the hernia, also not passing gas since Wednesday and been having nausea vomiting since then.  Has not had a bowel movement since Wednesday.  No fever, no cough or chest pain.  No shortness of breath.  No urinary symptoms.  Has been unable to hold anything down.  She  is on chemo for her colon cancer.  Last chemo session was on 26 March.   On independent chart review, she is followed by Dr. Orlie Dakin from oncology for her adenocarcinoma with mets to liver and lung.  She saw him on March 24 of this year.  She is currently on FOLFOX plus Avastin.  Also has history of uterine masses with history of uterine fibroids.  She was also seen by oncology in December 2024 and they noted an umbilical hernia with a 5 cm bulge at that time.  Physical Exam   Triage Vital Signs: ED Triage Vitals  Encounter Vitals Group     BP 02/07/24 1756 (!) 134/102     Systolic BP Percentile --      Diastolic BP Percentile --      Pulse Rate 02/07/24 1756 (!) 120     Resp 02/07/24 1756 18     Temp 02/07/24 1756 97.7 F (36.5 C)     Temp Source 02/07/24 1756 Oral     SpO2 02/07/24 1756 100 %     Weight 02/07/24 1757 179 lb (81.2 kg)     Height --      Head Circumference --      Peak Flow --      Pain Score 02/07/24 1757 6     Pain Loc --      Pain Education --      Exclude from Growth Chart --     Most recent vital signs: Vitals:   02/07/24 2000 02/07/24 2045  BP: (!) 145/99   Pulse: 99 83  Resp:  16  Temp:    SpO2: 97% 97%     General: Awake, no distress.  CV:  Good  peripheral perfusion.  Tachycardic Resp:  Normal effort.  No respiratory distress, no tachypnea Abd:  No distention.  Soft, umbilical hernia that is nonreducible, there is overlying erythema, she has some periumbilical tenderness as well as lower abdominal tenderness. Other:  Dry mucous membranes, no lower extremity swelling.   ED Results / Procedures / Treatments   Labs (all labs ordered are listed, but only abnormal results are displayed) Labs Reviewed  COMPREHENSIVE METABOLIC PANEL WITH GFR - Abnormal; Notable for the following components:      Result Value   Sodium 134 (*)    Chloride 91 (*)    CO2 21 (*)    Glucose, Bld 160 (*)    BUN 70 (*)    Creatinine, Ser 1.77 (*)    Total Protein 8.8 (*)    Total Bilirubin 1.3 (*)    GFR, Estimated 33 (*)    Anion gap 22 (*)    All other components within normal limits  CBC - Abnormal; Notable for the following components:   RDW 16.3 (*)    nRBC 0.9 (*)    All other components within normal limits  TROPONIN I (HIGH SENSITIVITY) - Abnormal; Notable for the following components:   Troponin I (High Sensitivity) 89 (*)    All other components within normal limits  RESP PANEL BY RT-PCR (RSV, FLU A&B, COVID)  RVPGX2  LIPASE, BLOOD  LACTIC ACID, PLASMA  URINALYSIS, W/ REFLEX TO CULTURE (INFECTION SUSPECTED)  LACTIC ACID, PLASMA  TYPE AND SCREEN  PREPARE RBC (CROSSMATCH)  TROPONIN I (HIGH SENSITIVITY)     EKG EKG shows sinus rhythm heart rate 98, normal QRS, normal QTc, no ischemic ST elevation, T wave inversion to aVL, 1, T wave flattening to aVF, this is change compared to prior   RADIOLOGY CT abdomen pelvis on my interpretation showed SBO with free air in the hernia sac.   PROCEDURES:  Critical Care performed: Yes, see critical care procedure note(s)  .Critical Care  Performed by: Claybon Jabs, MD Authorized by: Claybon Jabs, MD   Critical care provider statement:    Critical care time (minutes):  40   Critical care  was necessary to treat or prevent imminent or life-threatening deterioration of the following conditions: Incarcerated hernia with bowel perforation.   Critical care was time spent personally by me on the following activities:  Development of treatment plan with patient or surrogate, discussions with consultants, evaluation of patient's response to treatment, examination of patient, ordering and review of laboratory studies, ordering and review of radiographic studies, ordering and performing treatments and interventions, pulse oximetry, re-evaluation of patient's condition and review of old charts    MEDICATIONS ORDERED IN ED: Medications  piperacillin-tazobactam (ZOSYN) IVPB 3.375 g (3.375 g Intravenous New Bag/Given 02/07/24 2139)  0.9 %  sodium chloride infusion (Manually program via Guardrails IV Fluids) (has no administration in time range)  lactated ringers bolus 1,000 mL (0 mLs Intravenous Stopped 02/07/24 2131)  ondansetron (ZOFRAN) injection 4 mg (4 mg Intravenous Given 02/07/24 2018)  morphine (PF) 4 MG/ML injection 4 mg (4 mg Intravenous Given 02/07/24 2018)  iohexol (OMNIPAQUE) 300 MG/ML solution 80 mL (80 mLs Intravenous Contrast Given 02/07/24 2032)  sodium chloride 0.9 % bolus 1,000 mL (1,000 mLs Intravenous New Bag/Given 02/07/24 2138)     IMPRESSION / MDM / ASSESSMENT AND PLAN / ED COURSE  I reviewed the triage vital signs and the nursing notes.                              Differential diagnosis includes, but is not limited to, incarcerated hernia, strangulated hernia, SBO, partial SBO, mass, colitis, diverticulitis, dehydration, electrolyte derangements.  Will get labs, CT abdomen pelvis, will give some fluids, Zofran, IV morphine for pain.  Patient's presentation is most consistent with acute presentation with potential threat to life or bodily function.  Independent review of labs as well as clinical course as below.  Consult to hospitalist was agreeable with plan for  admission will evaluate the patient.  She is admitted.  Clinical Course as of 02/07/24 2154  Wynelle Link Feb 07, 2024  2104 Received a call from radiology, CT showed bowel obstruction 2/2 ventral hernia, free air in hernia sac concerning for incarcerated hernia or perf.  [TT]  2105 Will consult surgery. [TT]  2111 Consulted surgery who says that patient would likely need urgent surgery, recommended starting her on Zosyn right now, recommended  having a goals of care discussion with patient and to figure out her baseline ADLs and functional ability and whether or not she would want surgery. [TT]  2112 CT ABDOMEN PELVIS W CONTRAST IMPRESSION: 1. Interval development of small-bowel obstruction with point of transition related to moderate complex ventral hernia which contains mesenteric fat, fluid and slightly thickened small bowel loop. Small amount of free gas within the hernia sac, concern for incarcerated hernia with possible bowel perforation. Small volume free fluid within the hernia. 2. Interval decrease in size of previously measured hepatic metastatic lesions. However interim finding of multiple subcentimeter hypodense liver lesions/suspected metastatic lesions not clearly seen on the prior exam. 3. Slight decreased size of cecal mass. Slightly decreased size of posterior pelvic mass that is probably associated with the right ovary/adnexa. 4. Slight decreased size of index left lower lobe pulmonary nodule, however there appear to be several new small pulmonary nodules at the lung bases concerning for metastatic disease. 5. Aortic atherosclerosis.   [TT]  2120 Had an extensive discussion with patient and daughter, discussed the imaging results with patient, patient states that she is very functional still, wants to remain full code, wants to get the surgery done.  Will call back surgery for further plans. [TT]  2122 Independent review of labs, she has an AKI, no leukocytosis, lipase is  normal, respiratory viral panel is normal, lactate is 1.7, troponin is mildly elevated. [TT]  2131 Consulted surgery again and discussed my discussion with patient, he will come in to see the patient and take her to surgery.  Requesting hospitalist admission.  Wants several units typed and cross for him and ready.  Will put those orders in. [TT]    Clinical Course User Index [TT] Jodie Echevaria, Franchot Erichsen, MD     FINAL CLINICAL IMPRESSION(S) / ED DIAGNOSES   Final diagnoses:  Incarcerated hernia  Bowel perforation (HCC)  AKI (acute kidney injury) (HCC)     Rx / DC Orders   ED Discharge Orders     None        Note:  This document was prepared using Dragon voice recognition software and may include unintentional dictation errors.    Claybon Jabs, MD 02/07/24 [phone]

## 2024-02-07 NOTE — Assessment & Plan Note (Signed)
 Uterine masses Outpatient follow-up

## 2024-02-07 NOTE — Assessment & Plan Note (Signed)
     IVB adenocarcinoma of the colon with liver and lung metastasis Outpatient follow-up, have sent courtesy message to the patient's primary oncologist

## 2024-02-07 NOTE — ED Notes (Signed)
 X-ray at bedside to check placement of NG tube, Pabon, MD stated "we don't need it I will check placement with my hand in the OR"

## 2024-02-07 NOTE — H&P (Addendum)
 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  The patient reports nausea and vomiting since since Wednesday of this week has noticed that her umbilical hernia is unreducible with increasing abdominal pain when it is touched along with nausea and vomiting since then.  She denies any flatus or bowel movement since Wednesday.  The patient denies any chest pain fever chills or shortness of breath.  Case discussed with the emergency room physician who has already discussed this case with the surgeon on-call and plans to take the patient to the operating room from the ER and request admission under hospital medicine services primary.  The patient's daughter at the bedside Alexis Garcia confirmed the history as provided by the patient.   Past medical records reviewed and summarized the patient's last oncology outpatient progress note 02/01/2024: cycle 12 of FOLFOX plus Avastin planned.  Review of Systems:   Constitutional:Reports Weight Loss but no Fever, Chills or Night Sweats Respiratory: Denies Shortness of Breath, Cough, Hemoptysis, Wheezing, Pleurisy Cardiovascular: Denies Chest Pain, Paroxsymal Nocturnal Dyspnea, Palpitations, Edema Gastrointestinal: Reports Nausea, Vomiting But no Diarrhea,  Allergy/Immun: Denies history of Hay Fever, Positive PPD or Hives All other systems were reviewed and are negative   Past Medical History:  Diagnosis Date   Abnormal uterine bleeding    Anemia     Anxiety    Asthma    Cardiomegaly    Cardiovascular disease    COPD (chronic obstructive pulmonary disease) (HCC)    Depression    Diabetes mellitus without complication (HCC)    Dyspnea    Goiter, nontoxic, multinodular    Heart murmur    Hypertension    Hypothyroidism    Lung cancer, primary, with metastasis from lung to other site [facility])    Malignant neoplasm of ascending colon (HCC)    Primary adenocarcinoma of ascending colon (HCC)    Syncopal episodes    Thyrotoxicosis    Uterine leiomyoma    Past Surgical History:  Procedure Laterality Date   BIOPSY  08/12/2023   Procedure: BIOPSY;  Surgeon: Norma Fredrickson, Boykin Nearing, MD;  Location: [facility] ENDOSCOPY;  Service: Gastroenterology;;   CESAREAN SECTION     COLONOSCOPY N/A 08/12/2023   Procedure: COLONOSCOPY;  Surgeon: Toledo, Boykin Nearing, MD;  Location: [facility] ENDOSCOPY;  Service: Gastroenterology;  Laterality: N/A;  3rd patient   COLONOSCOPY WITH PROPOFOL N/A 10/29/2015   Procedure: COLONOSCOPY WITH PROPOFOL;  Surgeon: Elnita Maxwell, MD;  Location: [facility] ENDOSCOPY;  Service: Endoscopy;  Laterality: N/A;   DILITATION & CURRETTAGE/HYSTROSCOPY WITH NOVASURE ABLATION N/A 06/25/2018   Procedure: DILATATION & CURETTAGE/HYSTEROSCOPY WITH MINERVA ABLATION;  Surgeon: Hildred Laser, MD;  Location: [facility] ORS;  Service: Gynecology;  Laterality: N/A;   PORTACATH PLACEMENT N/A 08/26/2023   Procedure: INSERTION PORT-A-CATH;  Surgeon: Carolan Shiver, MD;  Location: [facility] ORS;  Service: General;  Laterality: N/A;   SUBMUCOSAL TATTOO INJECTION  08/12/2023   Procedure: SUBMUCOSAL TATTOO INJECTION;  Surgeon: Toledo, Boykin Nearing, MD;  Location: [facility] ENDOSCOPY;  Service: Gastroenterology;;   TUBAL LIGATION  Social History:  reports that she quit smoking about 14 months ago. Her smoking use included cigarettes. She has never used smokeless tobacco. She reports that she does not drink alcohol and does not use drugs.  No Known Allergies  Family History   Adopted: Yes  Problem Relation Age of Onset   Breast cancer Neg Hx     Prior to Admission medications   Medication Sig Start Date End Date Taking? Authorizing Provider  albuterol (PROVENTIL HFA;VENTOLIN HFA) 108 (90 Base) MCG/ACT inhaler Inhale 2 puffs into the lungs every 6 (six) hours as needed (for exercise induced asthma).    [provider]  atorvastatin (LIPITOR) 10 MG tablet SMARTSIG:1 Tablet(s) By Mouth Every Evening 07/14/23   [provider]  ezetimibe (ZETIA) 10 MG tablet Take 10 mg by mouth daily. 03/22/19   [provider]  hydrALAZINE (APRESOLINE) 10 MG tablet Take 10 mg by mouth 3 (three) times daily as needed. 10/20/23   [provider]  levothyroxine (SYNTHROID) 75 MCG tablet Take 75 mcg by mouth daily. 05/27/23   [provider]  lidocaine-prilocaine (EMLA) cream Apply to affected area once 08/17/23   Jeralyn Ruths, MD  metFORMIN (GLUCOPHAGE) 500 MG tablet Take 500 mg by mouth 2 (two) times daily.     [provider]  metoprolol tartrate (LOPRESSOR) 25 MG tablet Take 25 mg by mouth 2 (two) times daily.    [provider]  Multiple Vitamins-Minerals (ALIVE ONCE DAILY WOMENS PO) Take 2 tablets by mouth daily. Gummies    [provider]  ondansetron (ZOFRAN) 8 MG tablet Take 1 tablet (8 mg total) by mouth every 8 (eight) hours as needed for nausea or vomiting. Start on the third day after chemotherapy. 08/17/23   Jeralyn Ruths, MD  prochlorperazine (COMPAZINE) 10 MG tablet Take 1 tablet (10 mg total) by mouth every 6 (six) hours as needed for nausea or vomiting. 08/17/23   Jeralyn Ruths, MD    Physical Exam: Vitals:   02/07/24 1756 02/07/24 1757 02/07/24 2000 02/07/24 2045  BP: (!) 134/102  (!) 145/99   Pulse: (!) 120  99 83  Resp: 18   16  Temp: 97.7 F (36.5 C)     TempSrc: Oral     SpO2: 100%  97% 97%  Weight:  81.2 kg  81.2 kg  Height:    5\' 6"  (1.676 m)  Patient seen at 9:54 PM  approximately in room 9 of the emergency room with the patient's daughter Alexis Garcia present Constitutional:  Vital Signs as per [facility] than three noted] No Acute Distress Neck:     Trachea Midline, Neck Symmetric             Thyroid without tenderness, palpable masses or nodules Respiratory:   Respiratory Effort Normal: No Use of Respiratory Muscles,No  Intercostal Retractions             Lungs Clear to Auscultation Bilaterally Cardiovascular:   Heart Auscultated: Regular Regular without any added sounds or murmurs              No Lower Extremity Edema Gastrointestinal:  Abdomen is soft with a large and reducible periumbilical hernia with overlying red skin changes and exquisite tenderness to palpation Psychiatric:  Patient Orientated to Time, Place and Person Patient with appropriate mood and affect Recent and Remote Memory Intact  Data Reviewed: Labs, Radiology, ECG as detailed in HPI and A/P   Assessment and Plan: * Bowel obstruction (HCC) Acute  abdomen: Small bowel obstruction with incarcerated hernia and possible perforation This is being managed primarily by the surgical consultant who has been emergently consulted by the emergency department,  signed out patient is planned to OR, "SBO with incarcerated bowel and air in the hernia sac. dr pabon will take to OR" Patient's history is consistent with this, has already received crossmatch orders CT: 1. Interval development of small-bowel obstruction with point oftransition related to moderate complex ventral hernia which containsmesenteric fat, fluid and slightly thickened small bowel loop. Smallamount of free gas within the hernia sac, concern for incarceratedhernia with possible bowel perforation. Small volume free fluid within the hernia. Plan: Management as per surgery, Zofran, NPO, continue Zosyn, blood cultures, coags, chest x-ray  AKI (acute kidney injury) (HCC)  Acute kidney injury Creatinine 1.77 increased from 0.66  3/24 Suspect this is primarily prerenal due to her nausea and vomiting since Wednesday however the presence of a large periumbilical hernia raises the specter of postobstructive etiology as well in addition to her known malignancy We will obtain a retroperitoneal ultrasound to rule out hydronephrosis KV the need for any stenting, obtain Foley insertion to assist with outlet in the setting of the large umbilical hernia, lactic acid is 1.7 and received 1 L of crystalloid already we will provide crystalloid maintenance and pending the outcome of the retroperitoneal ultrasound consult urology if hydro present, follow serial BMP   Elevated troponin Elevated troponin Anticipate this is in the setting of the acute kidney injury or type II in the setting of increased demand and of not of primary cardiac significance, initial troponin 89 CT: Aortic atherosclerosis. We will hold off aspirin given plan for surgery repeat serial troponins and obtain echocardiogram for confirmation Dependent review and interpretation of the patient's twelve-lead ECG is sinus rhythm 98 ventricular rate PR interval 120 QTc 426 with anterior lateral ST depressions, given her lack of chest pain and the history provided this is a probable visceral cardiac reflex  HTN (hypertension)  Essential hypertension I have held her home oral antihypertensives given n.p.o. If she remains stable in the setting of her acute illness can introduce IV as needed antihypertensives as needed  DM2 (diabetes mellitus, type 2) (HCC)    Type 2 diabetes mellitus Hold home metformin and place on every 4 sliding scale insulin while n.p.o.   Colon cancer (HCC)     IVB adenocarcinoma of the colon with liver and lung metastasis Outpatient follow-up following acute surgical problem   Pulmonary nodules  Pulmonary nodules Outpatient follow-up  Hyponatremia  Hyponatremia Sodium 134 likely secondary to her volume depletion status   Uterine  mass Uterine masses Outpatient follow-up   Elevated bilirubin    Elevated bilirubin I have ordered a repeat for the a.m. given her multiple intra-abdominal processes  Hypothyroidism Hypothyroidism On Synthroid 75 mcg daily, holding given p.o. we will consider IV supplementation if she stays n.p.o. more than 48 hours   History of COPD   Chronic obstructive pulmonary disorder By history lungs clear to auscultation Albuterol as needed ordered      Advance Care Planning:   Code Status: Full Code    Both myself as well as the emergency department physician had discussion with the patient and the patient's daughter given her comorbidities and malignancy goals of care and she wishes for full code and aggressive surgical intervention if required to prolong her life.  Consults: Surgery (Consulted by ED)  Family Communication: Daughter at bedside   Severity of Illness: The appropriate  patient status for this patient is INPATIENT. Inpatient status is judged to be reasonable and necessary in order to provide the required intensity of service to ensure the patient's safety. The patient's presenting symptoms, physical exam findings, and initial radiographic and laboratory data in the context of their chronic comorbidities is felt to place them at high risk for further clinical deterioration. Furthermore, it is not anticipated that the patient will be medically stable for discharge from the hospital within 2 midnights of admission.   * I certify that at the point of admission it is my clinical judgment that the patient will require inpatient hospital care spanning beyond 2 midnights from the point of admission due to high intensity of service, high risk for further deterioration and high frequency of surveillance required.*  Upon my evaluation, this patient had a high probability of imminent or life-threatening deterioration due to Acute Kidney Injury and Acute Abdomen, which required my  direct attention, intervention, and personal management. I have personally provided 38 minutes of critical care time exclusive of time spent on separately billable procedures. Time includes review of laboratory data, radiology results, discussion with consultants, and monitoring for potential decompensation. Interventions were performed as documented above.  Patient is plan to the OR from the emergency room I have placed patient's orders to the ICU as per bed placement criteria: Major abdominal procedures and the patient can be reevaluated post acute surgical intervention for de-escalation following multidisciplinary discussion with anesthesia and surgeon post OR. Has high risk for misadventure.  Author: Princess Bruins, MD 02/07/2024 10:58 PM  For on call review [email or web address].

## 2024-02-07 NOTE — Anesthesia Procedure Notes (Signed)
 Procedure Name: Intubation Date/Time: 02/07/2024 11:46 PM  Performed by: Stormy Fabian, CRNAPre-anesthesia Checklist: Patient identified, Emergency Drugs available, Suction available and Patient being monitored Patient Re-evaluated:Patient Re-evaluated prior to induction Oxygen Delivery Method: Circle system utilized Preoxygenation: Pre-oxygenation with 100% oxygen Induction Type: IV induction, Cricoid Pressure applied and Rapid sequence Laryngoscope Size: Mac and 3 Grade View: Grade II Tube type: Oral Tube size: 7.0 mm Number of attempts: 1 Airway Equipment and Method: Stylet Placement Confirmation: ETT inserted through vocal cords under direct vision, positive ETCO2 and breath sounds checked- equal and bilateral Secured at: 21 cm Tube secured with: Tape Dental Injury: Teeth and Oropharynx as per pre-operative assessment

## 2024-02-07 NOTE — Assessment & Plan Note (Addendum)
 Acute abdomen: Small bowel obstruction with incarcerated hernia and possible perforation This is being managed primarily by the surgical consultant who has been emergently consulted by the emergency department,  signed out patient is planned to OR, "SBO with incarcerated bowel and air in the hernia sac. dr pabon will take to OR" Patient's history is consistent with this, has already received crossmatch orders CT: 1. Interval development of small-bowel obstruction with point oftransition related to moderate complex ventral hernia which containsmesenteric fat, fluid and slightly thickened small bowel loop. Smallamount of free gas within the hernia sac, concern for incarceratedhernia with possible bowel perforation. Small volume free fluid within the hernia. Plan: Management as per surgery, Zofran, NPO, continue Zosyn, blood cultures, coags, chest x-ray

## 2024-02-07 NOTE — ED Triage Notes (Signed)
 Pt sts that she has been having GI upset and not able to keep fluids down. Pt is actively getting treated for Colon, liver and lung cancer. Pt last tx was on 02/01/2024.

## 2024-02-07 NOTE — Assessment & Plan Note (Signed)
 Hypothyroidism On Synthroid 75 mcg daily, holding given p.o. we will consider IV supplementation if she stays n.p.o. more than 48 hours

## 2024-02-07 NOTE — Assessment & Plan Note (Signed)
  Pulmonary nodules Outpatient follow-up

## 2024-02-07 NOTE — Assessment & Plan Note (Signed)
  Hyponatremia Sodium 134 likely secondary to her volume depletion status

## 2024-02-07 NOTE — Anesthesia Preprocedure Evaluation (Signed)
 Anesthesia Evaluation  Patient identified by MRN, date of birth, ID band Patient awake    Reviewed: Allergy & Precautions, NPO status , Patient's Chart, lab work & pertinent test results  History of Anesthesia Complications Negative for: history of anesthetic complications  Airway Mallampati: III  TM Distance: >3 FB Neck ROM: full    Dental  (+) Poor Dentition   Pulmonary shortness of breath, asthma , COPD,  COPD inhaler, former smoker   Pulmonary exam normal        Cardiovascular hypertension, On Medications + Peripheral Vascular Disease  Normal cardiovascular exam+ Valvular Problems/Murmurs      Neuro/Psych  PSYCHIATRIC DISORDERS Anxiety Depression    negative neurological ROS     GI/Hepatic negative GI ROS, Neg liver ROS,,,  Endo/Other  diabetes, Type 2, Insulin DependentHypothyroidism    Renal/GU ARFRenal disease     Musculoskeletal   Abdominal   Peds  Hematology  (+) Blood dyscrasia, anemia   Anesthesia Other Findings Past Medical History: No date: Abnormal uterine bleeding No date: Anemia No date: Anxiety No date: Asthma No date: Cardiomegaly No date: Cardiovascular disease No date: COPD (chronic obstructive pulmonary disease) (HCC) No date: Depression No date: Diabetes mellitus without complication (HCC) No date: Dyspnea 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, nontoxic, multinodular No date: Heart murmur No date: Hypertension No date: Hypothyroidism No date: Lung cancer, primary, with metastasis from lung to other  site [facility]) No date: Malignant neoplasm of ascending colon (HCC) No date: Primary adenocarcinoma of ascending colon (HCC) No date: Syncopal episodes No date: Thyrotoxicosis No date: Uterine leiomyoma  Past Surgical History: 08/12/2023: BIOPSY     Comment:  Procedure: BIOPSY;  Surgeon: Toledo, Boykin Nearing, MD;                Location[facility] ENDOSCOPY;  Service: Gastroenterology;; No date: CESAREAN  SECTION 08/12/2023: COLONOSCOPY; N/A     Comment:  Procedure: COLONOSCOPY;  Surgeon: Toledo, Boykin Nearing, MD;              Location: [facility] ENDOSCOPY;  Service: Gastroenterology;                Laterality: N/A;  3rd patient 10/29/2015: COLONOSCOPY WITH PROPOFOL; N/A     Comment:  Procedure: COLONOSCOPY WITH PROPOFOL;  Surgeon: Elnita Maxwell, MD;  Location: [facility] ENDOSCOPY;  Service:               Endoscopy;  Laterality: N/A; 06/25/2018: DILITATION & CURRETTAGE/HYSTROSCOPY WITH NOVASURE  ABLATION; N/A     Comment:  Procedure: DILATATION & CURETTAGE/HYSTEROSCOPY WITH               MINERVA ABLATION;  Surgeon: Hildred Laser, MD;  Location:              [facility] ORS;  Service: Gynecology;  Laterality: N/A; 08/26/2023: PORTACATH PLACEMENT; N/A     Comment:  Procedure: INSERTION PORT-A-CATH;  Surgeon:               Carolan Shiver, MD;  Location: [facility] ORS;  Service:              General;  Laterality: N/A; 08/12/2023: SUBMUCOSAL TATTOO INJECTION     Comment:  Procedure: SUBMUCOSAL TATTOO INJECTION;  Surgeon:               Toledo, Boykin Nearing, MD;  Location: [facility] ENDOSCOPY;  Service: Gastroenterology;; No date: TUBAL LIGATION  BMI    Body Mass Index: 28.89 kg/m      Reproductive/Obstetrics negative OB ROS                             Anesthesia Physical Anesthesia Plan  ASA: 3  Anesthesia Plan: General ETT   Post-op Pain Management: Ofirmev IV (intra-op)* and Dilaudid IV   Induction: Intravenous and Rapid sequence  PONV Risk Score and Plan: 3 and Ondansetron, Dexamethasone, Midazolam and Treatment may vary due to age or medical condition  Airway Management Planned: Oral ETT  Additional Equipment:   Intra-op Plan:   Post-operative Plan: Extubation in OR  Informed Consent: I have reviewed the patients History and Physical, chart, labs and discussed the procedure including the risks, benefits and alternatives for the  proposed anesthesia with the patient or authorized representative who has indicated his/her understanding and acceptance.     Dental Advisory Given  Plan Discussed with: Anesthesiologist, CRNA and Surgeon  Anesthesia Plan Comments: (Patient consented for risks of anesthesia including but not limited to:  - adverse reactions to medications - damage to eyes, teeth, lips or other oral mucosa - nerve damage due to positioning  - sore throat or hoarseness - Damage to heart, brain, nerves, lungs, other parts of body or loss of life  Patient voiced understanding and assent.)        Anesthesia Quick Evaluation

## 2024-02-07 NOTE — Assessment & Plan Note (Signed)
   Chronic obstructive pulmonary disorder By history lungs clear to auscultation Albuterol as needed ordered

## 2024-02-08 ENCOUNTER — Encounter: Payer: Self-pay | Admitting: Surgery

## 2024-02-08 ENCOUNTER — Other Ambulatory Visit: Payer: Self-pay

## 2024-02-08 ENCOUNTER — Inpatient Hospital Stay

## 2024-02-08 ENCOUNTER — Inpatient Hospital Stay (HOSPITAL_COMMUNITY)
Admit: 2024-02-08 | Discharge: 2024-02-08 | Disposition: A | Discharge status: Home / Self Care | Attending: Surgery | Admitting: Surgery

## 2024-02-08 DIAGNOSIS — R7989 Other specified abnormal findings of blood chemistry: Secondary | ICD-10-CM

## 2024-02-08 DIAGNOSIS — E860 Dehydration: Secondary | ICD-10-CM

## 2024-02-08 DIAGNOSIS — K436 Other and unspecified ventral hernia with obstruction, without gangrene: Secondary | ICD-10-CM | POA: Diagnosis not present

## 2024-02-08 DIAGNOSIS — R100 Acute abdomen: Secondary | ICD-10-CM | POA: Diagnosis present

## 2024-02-08 DIAGNOSIS — E440 Moderate protein-calorie malnutrition: Secondary | ICD-10-CM | POA: Insufficient documentation

## 2024-02-08 DIAGNOSIS — K5652 Intestinal adhesions [bands] with complete obstruction: Secondary | ICD-10-CM

## 2024-02-08 DIAGNOSIS — K460 Unspecified abdominal hernia with obstruction, without gangrene: Secondary | ICD-10-CM

## 2024-02-08 DIAGNOSIS — K631 Perforation of intestine (nontraumatic): Principal | ICD-10-CM

## 2024-02-08 LAB — ECHOCARDIOGRAM COMPLETE
AR max vel: 2.92 cm2
AV Area VTI: 2.98 cm2
AV Area mean vel: 3.12 cm2
AV Mean grad: 3 mmHg
AV Peak grad: 5.3 mmHg
Ao pk vel: 1.15 m/s
Area-P 1/2: 2.17 cm2
Height: 66 in
MV VTI: 2.23 cm2
S' Lateral: 1.48 cm
Weight: 2864.22 [oz_av]

## 2024-02-08 LAB — TYPE AND SCREEN
ABO/RH(D): B POS
Antibody Screen: NEGATIVE
Unit division: 0
Unit division: 0
Unit division: 0

## 2024-02-08 LAB — BPAM RBC
Blood Product Expiration Date: 202504222359
Blood Product Expiration Date: 202504222359
Blood Product Expiration Date: 202504262359
Unit Type and Rh: 5100
Unit Type and Rh: 5100
Unit Type and Rh: 5100

## 2024-02-08 LAB — APTT: aPTT: 30 s (ref 24–36)

## 2024-02-08 LAB — GLUCOSE, CAPILLARY
Glucose-Capillary: 111 mg/dL — ABNORMAL HIGH (ref 70–99)
Glucose-Capillary: 112 mg/dL — ABNORMAL HIGH (ref 70–99)
Glucose-Capillary: 120 mg/dL — ABNORMAL HIGH (ref 70–99)
Glucose-Capillary: 120 mg/dL — ABNORMAL HIGH (ref 70–99)
Glucose-Capillary: 146 mg/dL — ABNORMAL HIGH (ref 70–99)
Glucose-Capillary: 149 mg/dL — ABNORMAL HIGH (ref 70–99)
Glucose-Capillary: 149 mg/dL — ABNORMAL HIGH (ref 70–99)

## 2024-02-08 LAB — COMPREHENSIVE METABOLIC PANEL WITH GFR
ALT: 20 U/L (ref 0–44)
AST: 26 U/L (ref 15–41)
Albumin: 2.8 g/dL — ABNORMAL LOW (ref 3.5–5.0)
Alkaline Phosphatase: 53 U/L (ref 38–126)
Anion gap: 16 — ABNORMAL HIGH (ref 5–15)
BUN: 68 mg/dL — ABNORMAL HIGH (ref 6–20)
CO2: 24 mmol/L (ref 22–32)
Calcium: 7.6 mg/dL — ABNORMAL LOW (ref 8.9–10.3)
Chloride: 95 mmol/L — ABNORMAL LOW (ref 98–111)
Creatinine, Ser: 1.48 mg/dL — ABNORMAL HIGH (ref 0.44–1.00)
GFR, Estimated: 40 mL/min — ABNORMAL LOW (ref >60)
Glucose, Bld: 136 mg/dL — ABNORMAL HIGH (ref 70–99)
Potassium: 3 mmol/L — ABNORMAL LOW (ref 3.5–5.1)
Sodium: 135 mmol/L (ref 135–145)
Total Bilirubin: 1.1 mg/dL (ref 0.0–1.2)
Total Protein: 6.6 g/dL (ref 6.5–8.1)

## 2024-02-08 LAB — URINALYSIS, W/ REFLEX TO CULTURE (INFECTION SUSPECTED)
Bacteria, UA: NONE SEEN
Bilirubin Urine: NEGATIVE
Glucose, UA: NEGATIVE mg/dL
Hgb urine dipstick: NEGATIVE
Ketones, ur: NEGATIVE mg/dL
Leukocytes,Ua: NEGATIVE
Nitrite: NEGATIVE
Protein, ur: NEGATIVE mg/dL
Specific Gravity, Urine: >1.046 — ABNORMAL HIGH (ref 1.005–1.030)
pH: 5 (ref 5.0–8.0)

## 2024-02-08 LAB — HEMOGLOBIN A1C
Hgb A1c MFr Bld: 6.5 % — ABNORMAL HIGH (ref 4.8–5.6)
Mean Plasma Glucose: 139.85 mg/dL

## 2024-02-08 LAB — CBC
HCT: 35 % — ABNORMAL LOW (ref 36.0–46.0)
Hemoglobin: 11.9 g/dL — ABNORMAL LOW (ref 12.0–15.0)
MCH: 31 pg (ref 26.0–34.0)
MCHC: 34 g/dL (ref 30.0–36.0)
MCV: 91.1 fL (ref 80.0–100.0)
Platelets: 149 10*3/uL — ABNORMAL LOW (ref 150–400)
RBC: 3.84 MIL/uL — ABNORMAL LOW (ref 3.87–5.11)
RDW: 16 % — ABNORMAL HIGH (ref 11.5–15.5)
WBC: 4.3 10*3/uL (ref 4.0–10.5)
nRBC: 0.7 % — ABNORMAL HIGH (ref 0.0–0.2)

## 2024-02-08 LAB — MAGNESIUM: Magnesium: 1.9 mg/dL (ref 1.7–2.4)

## 2024-02-08 LAB — PROTIME-INR
INR: 1.2 (ref 0.8–1.2)
Prothrombin Time: 15.4 s — ABNORMAL HIGH (ref 11.4–15.2)

## 2024-02-08 LAB — PHOSPHORUS: Phosphorus: 3.7 mg/dL (ref 2.5–4.6)

## 2024-02-08 LAB — PREPARE RBC (CROSSMATCH)

## 2024-02-08 LAB — CBG MONITORING, ED: Glucose-Capillary: 144 mg/dL — ABNORMAL HIGH (ref 70–99)

## 2024-02-08 MED ORDER — OXYCODONE HCL 5 MG PO TABS: 5.0000 mg | ORAL_TABLET | Freq: Once | ORAL | Status: DC | PRN

## 2024-02-08 MED ORDER — ONDANSETRON HCL 4 MG/2ML IJ SOLN
INTRAMUSCULAR | Status: DC | PRN
  Administered 2024-02-08: 4 mg via INTRAVENOUS

## 2024-02-08 MED ORDER — SODIUM CHLORIDE 0.9% FLUSH: 10.0000 mL | INTRAVENOUS | Status: DC | PRN

## 2024-02-08 MED ORDER — LACTATED RINGERS IV SOLN: INTRAVENOUS | Status: DC | PRN

## 2024-02-08 MED ORDER — PANTOPRAZOLE SODIUM 40 MG IV SOLR
40.0000 mg | Freq: Every day | INTRAVENOUS | Status: DC
  Administered 2024-02-08 – 2024-02-13 (×6): 40 mg via INTRAVENOUS
  Filled 2024-02-08 (×6): qty 10

## 2024-02-08 MED ORDER — ACETAMINOPHEN 10 MG/ML IV SOLN
INTRAVENOUS | Status: DC | PRN
  Administered 2024-02-08: 1000 mg via INTRAVENOUS

## 2024-02-08 MED ORDER — OXYCODONE HCL 5 MG/5ML PO SOLN
5.0000 mg | Freq: Once | ORAL | Status: DC | PRN
Start: 2024-02-08 — End: 2024-02-08

## 2024-02-08 MED ORDER — MORPHINE SULFATE (PF) 2 MG/ML IV SOLN
1.0000 mg | INTRAVENOUS | Status: DC | PRN
  Administered 2024-02-08 – 2024-02-11 (×3): 1 mg via INTRAVENOUS
  Filled 2024-02-08 (×3): qty 1

## 2024-02-08 MED ORDER — HYDROMORPHONE HCL 1 MG/ML IJ SOLN
INTRAMUSCULAR | Status: AC
Start: 2024-02-08 — End: ?
  Filled 2024-02-08: qty 1

## 2024-02-08 MED ORDER — KETOROLAC TROMETHAMINE 15 MG/ML IJ SOLN
15.0000 mg | Freq: Four times a day (QID) | INTRAMUSCULAR | Status: AC
  Administered 2024-02-08 – 2024-02-13 (×20): 15 mg via INTRAVENOUS
  Filled 2024-02-08 (×20): qty 1

## 2024-02-08 MED ORDER — HYDROMORPHONE HCL 1 MG/ML IJ SOLN
INTRAMUSCULAR | Status: AC
  Filled 2024-02-08: qty 1

## 2024-02-08 MED ORDER — POTASSIUM CHLORIDE 10 MEQ/100ML IV SOLN
10.0000 meq | INTRAVENOUS | Status: AC
  Administered 2024-02-08 (×5): 10 meq via INTRAVENOUS
  Filled 2024-02-08 (×5): qty 100

## 2024-02-08 MED ORDER — TRAVASOL 10 % IV SOLN: INTRAVENOUS | Status: DC

## 2024-02-08 MED ORDER — VASOPRESSIN 20 UNIT/ML IV SOLN
INTRAVENOUS | Status: DC | PRN
Start: 2024-02-08 — End: 2024-02-08
  Administered 2024-02-07 – 2024-02-08 (×2): 2 [IU] via INTRAVENOUS

## 2024-02-08 MED ORDER — ALBUMIN HUMAN 25 % IV SOLN
25.0000 g | Freq: Once | INTRAVENOUS | Status: AC
  Administered 2024-02-08: 25 g via INTRAVENOUS
  Filled 2024-02-08: qty 100

## 2024-02-08 MED ORDER — 0.9 % SODIUM CHLORIDE (POUR BTL) OPTIME
TOPICAL | Status: DC | PRN
  Administered 2024-02-08: 3500 mL

## 2024-02-08 MED ORDER — CHLORHEXIDINE GLUCONATE CLOTH 2 % EX PADS
6.0000 | MEDICATED_PAD | Freq: Every day | CUTANEOUS | Status: DC
  Administered 2024-02-09 – 2024-02-14 (×6): 6 via TOPICAL

## 2024-02-08 MED ORDER — HYDROMORPHONE HCL 1 MG/ML IJ SOLN
0.2500 mg | INTRAMUSCULAR | Status: DC | PRN
Start: 2024-02-08 — End: 2024-02-08
  Administered 2024-02-08 (×3): 0.5 mg via INTRAVENOUS

## 2024-02-08 MED ORDER — IRRISEPT - 450ML BOTTLE WITH 0.05% CHG IN STERILE WATER, USP 99.95% OPTIME
TOPICAL | Status: DC | PRN
  Administered 2024-02-08: 450 mL via TOPICAL

## 2024-02-08 MED ORDER — SUGAMMADEX SODIUM 200 MG/2ML IV SOLN
INTRAVENOUS | Status: DC | PRN
  Administered 2024-02-08: 324.8 mg via INTRAVENOUS

## 2024-02-08 MED ORDER — TRAVASOL 10 % IV SOLN
INTRAVENOUS | Status: AC
  Filled 2024-02-08: qty 356.2

## 2024-02-08 MED ORDER — THIAMINE HCL 100 MG/ML IJ SOLN
100.0000 mg | Freq: Every day | INTRAMUSCULAR | Status: DC
  Administered 2024-02-08 – 2024-02-11 (×4): 100 mg via INTRAVENOUS
  Filled 2024-02-08 (×4): qty 2

## 2024-02-08 MED ORDER — HYDRALAZINE HCL 20 MG/ML IJ SOLN: 10.0000 mg | Freq: Four times a day (QID) | INTRAMUSCULAR | Status: DC | PRN

## 2024-02-08 MED ORDER — ACETAMINOPHEN 10 MG/ML IV SOLN
1000.0000 mg | Freq: Four times a day (QID) | INTRAVENOUS | Status: AC
  Administered 2024-02-08 (×3): 1000 mg via INTRAVENOUS
  Filled 2024-02-08 (×4): qty 100

## 2024-02-08 MED ORDER — SODIUM CHLORIDE 0.9% FLUSH
10.0000 mL | Freq: Two times a day (BID) | INTRAVENOUS | Status: DC
  Administered 2024-02-08 – 2024-02-14 (×12): 10 mL

## 2024-02-08 NOTE — Progress Notes (Signed)
 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  The patient reports nausea and vomiting since since Wednesday of this week has noticed that her umbilical hernia is unreducible with increasing abdominal pain when it is touched along with nausea and vomiting since then.  She denies any flatus or bowel movement since Wednesday.  The patient denies any chest pain fever chills or shortness of breath.   Case discussed with the emergency room physician who has already discussed this case with the surgeon on-call and plans to take the patient to the operating room from the ER and request admission under hospital medicine services primary.   The patient's daughter at the bedside [name] confirmed the history as provided by the patient.     Past medical records reviewed and summarized the patient's last oncology outpatient progress note 02/01/2024: cycle 12 of FOLFOX plus Avastin planned.  Assessment and Plan:  Small bowel obstruction and perforation due to strangulated ventral hernia Focal peritonitis due to perforation Continue IV Zosyn General Surgery consulted status post laparotomy, small bowel defect was closed, no bowel resection was performed. Continue NG tube with suction Continue abdominal drain Keep n.p.o. Continue IV fluid for hydration Monitor electrolytes Start TPN   Hypokalemia, potassium repleted. Monitor electrolytes and replete as needed.  AKI due to dehydration Monitor renal functions Continue IV fluid for  hydration  Elevated troponin most likely demand ischemia TTE LVEF 70 to 75%, no WMA, grade 1 diastolic dysfunction, no significant valvular abnormality.  NIDDM T2, HbA1c 6.5 well-controlled Held home medications for now Monitor CBG Continue NovoLog sliding scale  COPD, no exacerbation noticed on exam Continue albuterol nebs as needed  Hypertension, HLD Held home medications for now Monitor BP and manage accordingly Use IV hydralazine as needed  Hypothyroid, resume Synthroid when patient is able to take oral meds   Body mass index is 28.89 kg/m.  Nutrition Problem: Moderate Malnutrition Etiology: chronic illness (stage IV colon cancer) Interventions: Interventions: MVI, TPN   Diet: NPO on TPN DVT Prophylaxis: Subcutaneous Heparin    Advance goals of care discussion: Full code  Family Communication: family was not present at bedside, at the time of interview.  The pt provided permission to discuss medical plan with the family. Opportunity was given to ask question and all questions were answered satisfactorily.   Disposition:  Pt is from Home, admitted with SBO with perforation, s/p Exlap, still on Iv Abx, NPO and on TPN, which precludes a safe discharge. Discharge to Home, when stable, may need few days to improve.  Subjective: No significant overnight events, patient had ex lap done, tolerated procedure well.  Pain is under control.  Patient feels gurgling after got but no BM and not passing gas. Resting comfortably, denied any chest pain or palpitation, no shortness of breath.  Physical Exam: General: NAD, lying comfortably Appear in no distress, affect appropriate Eyes: PERRLA ENT: Oral Mucosa Clear, moist  Neck: no JVD,  Cardiovascular: S1 and S2 Present, no Murmur,  Respiratory: good respiratory effort, Bilateral Air entry equal and  Decreased, no Crackles, no wheezes Abdomen: s/p ex lap, dressing CDI, JP drain intact, postop tenderness, bowel sounds sluggish.   NG tube intact Skin: no rashes Extremities: no Pedal edema, no calf tenderness Neurologic: without any new focal findings Gait not checked due to patient safety concerns  Vitals:   02/08/24 0159 02/08/24 0320 02/08/24 0835 02/08/24 1227  BP: 124/68 121/74 (!) 129/59 124/79  Pulse: 76 79 69 68  Resp: 17  18   Temp: 97.6 F (36.4 C) 97.6 F (36.4 C) (!) 97.5 F (36.4 C) 98.4 F (36.9 C)  TempSrc: Oral Oral Oral   SpO2: 100% 100% 100% 100%  Weight:      Height:        Intake/Output Summary (Last 24 hours) at 02/08/2024 1519 Last data filed at 02/08/2024 0981 Gross per 24 hour  Intake 2763.44 ml  Output 820 ml  Net 1943.44 ml   Filed Weights   02/07/24 1757 02/07/24 2045  Weight: 81.2 kg 81.2 kg    Data Reviewed: I have personally reviewed and interpreted daily labs, tele strips, imagings as discussed above. I reviewed all nursing notes, pharmacy notes, vitals, pertinent old records I have discussed plan of care as described above with RN and patient/family.  CBC: Recent Labs  Lab 02/07/24 1807 02/08/24 0211  WBC 7.0 4.3  HGB 14.5 11.9*  HCT 43.8 35.0*  MCV 90.5 91.1  PLT 255 149*   Basic Metabolic Panel: Recent Labs  Lab 02/07/24 1807 02/08/24 0211  NA 134* 135  K 3.7 3.0*  CL 91* 95*  CO2 21* 24  GLUCOSE 160* 136*  BUN 70* 68*  CREATININE 1.77* 1.48*  CALCIUM 9.1 7.6*  MG  --  1.9  PHOS  --  3.7    Studies: US RENAL Result Date: 02/08/2024 CLINICAL DATA:  Acute kidney injury. Status post laparotomy for repair incarcerated ventral hernia with associated small bowel obstruction, small-bowel perforation and peritonitis peer EXAM: RENAL / URINARY TRACT ULTRASOUND COMPLETE COMPARISON:  None Available. FINDINGS: Right Kidney: Renal measurements: 11.3 x 4.6 x 5.6 cm = volume: 150 mL. Echogenicity within normal limits. Mild prominence of renal pelvis without overt hydronephrosis. No renal lesions visualized. Simple cyst of the mid kidney measures up to 1.9 cm  and has a benign appearance require no follow-up. Left Kidney: Renal measurements: 10.5 x 5.3 x 5.5 cm = volume: 160 mL. Echogenicity within normal limits. No mass or hydronephrosis visualized. Bladder: Not visualized due to surgical dressings from recent laparotomy. Other: None. IMPRESSION: 1. Mild prominence of the right renal pelvis without overt hydronephrosis. 2. 1.9 cm simple cyst of the mid right kidney requires no follow-up. 3. Bladder not visualized due to surgical dressings from recent laparotomy. Electronically Signed   By: Irish Lack M.D.   On: 02/08/2024 11:52   ECHOCARDIOGRAM COMPLETE Result Date: 02/08/2024    ECHOCARDIOGRAM REPORT   Patient Name:   JOUD PETTINATO Date of Exam: 02/08/2024 Medical Rec #:  [id number]    Height:       66.0 in Accession #:    [id number]   Weight:       179.0 lb Date of Birth:  [birth year]    BSA:          1.908 m Patient Age:    62 years     BP:           129/59 mmHg Patient Gender: F            HR:  69 bpm. Exam Location:  [facility] Procedure: 2D Echo, Cardiac Doppler, Color Doppler, Strain Analysis and 3D Echo            (Both Spectral and Color Flow Doppler were utilized during            procedure). Indications:     Elevated Troponin  History:         Patient has no prior history of Echocardiogram examinations.                  COPD, Signs/Symptoms:Murmur; Risk Factors:Hypertension.  Sonographer:     Cristela Blue Referring Phys:  [id number] DIEGO F PABON Diagnosing Phys: Yvonne Kendall MD  Sonographer Comments: Global longitudinal strain was attempted. IMPRESSIONS  1. Left ventricular ejection fraction, by estimation, is 70 to 75%. The left ventricle has hyperdynamic function. The left ventricle has no regional wall motion abnormalities. There is moderate left ventricular hypertrophy. Left ventricular diastolic parameters are consistent with Grade I diastolic dysfunction (impaired relaxation). The average left ventricular global longitudinal strain is -15.0 %. The  global longitudinal strain is abnormal.  2. Right ventricular systolic function is normal. The right ventricular size is normal.  3. The mitral valve is normal in structure. Trivial mitral valve regurgitation.  4. The aortic valve has an indeterminant number of cusps. There is mild thickening of the aortic valve. Aortic valve regurgitation is not visualized. Aortic valve sclerosis is present, with no evidence of aortic valve stenosis. FINDINGS  Left Ventricle: Left ventricular ejection fraction, by estimation, is 70 to 75%. The left ventricle has hyperdynamic function. The left ventricle has no regional wall motion abnormalities. The average left ventricular global longitudinal strain is -15.0  %. Strain was performed and the global longitudinal strain is abnormal. 3D ejection fraction reviewed and evaluated as part of the interpretation. Alternate measurement of EF is felt to be most reflective of LV function. The left ventricular internal cavity size was normal in size. There is moderate left ventricular hypertrophy. Left ventricular diastolic parameters are consistent with Grade I diastolic dysfunction (impaired relaxation). Right Ventricle: The right ventricular size is normal. No increase in right ventricular wall thickness. Right ventricular systolic function is normal. Left Atrium: Left atrial size was normal in size. Right Atrium: Right atrial size was normal in size. Pericardium: The pericardium was not well visualized. Mitral Valve: The mitral valve is normal in structure. Trivial mitral valve regurgitation. MV peak gradient, 7.4 mmHg. The mean mitral valve gradient is 2.0 mmHg. Tricuspid Valve: The tricuspid valve is not well visualized. Tricuspid valve regurgitation is not demonstrated. Aortic Valve: The aortic valve has an indeterminant number of cusps. There is mild thickening of the aortic valve. Aortic valve regurgitation is not visualized. Aortic valve sclerosis is present, with no evidence of  aortic valve stenosis. Aortic valve mean gradient measures 3.0 mmHg. Aortic valve peak gradient measures 5.3 mmHg. Aortic valve area, by VTI measures 2.98 cm. Pulmonic Valve: The pulmonic valve was not well visualized. Pulmonic valve regurgitation is not visualized. No evidence of pulmonic stenosis. Aorta: The aortic root is normal in size and structure. Pulmonary Artery: The pulmonary artery is not well seen. IAS/Shunts: The interatrial septum was not well visualized. Additional Comments: 3D was performed not requiring image post processing on an independent workstation and was abnormal.  LEFT VENTRICLE PLAX 2D LVIDd:         3.36 cm   Diastology LVIDs:         1.48 cm   LV e'  medial:    6.09 cm/s LV PW:         1.60 cm   LV E/e' medial:  10.0 LV IVS:        1.63 cm   LV e' lateral:   8.49 cm/s LVOT diam:     2.00 cm   LV E/e' lateral: 7.2 LV SV:         66 LV SV Index:   34        2D Longitudinal Strain LVOT Area:     3.14 cm  2D Strain GLS Avg:     -15.0 %                           3D Volume EF:                          3D EF:        44 % RIGHT VENTRICLE RV Basal diam:  3.20 cm RV Mid diam:    2.40 cm LEFT ATRIUM           Index        RIGHT ATRIUM           Index LA diam:      3.80 cm 1.99 cm/m   RA Area:     11.90 cm LA Vol (A2C): 15.3 ml 8.02 ml/m   RA Volume:   23.60 ml  12.37 ml/m LA Vol (A4C): 25.4 ml 13.31 ml/m  AORTIC VALVE AV Area (Vmax):    2.92 cm AV Area (Vmean):   3.12 cm AV Area (VTI):     2.98 cm AV Vmax:           115.00 cm/s AV Vmean:          78.300 cm/s AV VTI:            0.220 m AV Peak Grad:      5.3 mmHg AV Mean Grad:      3.0 mmHg LVOT Vmax:         107.00 cm/s LVOT Vmean:        77.700 cm/s LVOT VTI:          0.209 m LVOT/AV VTI ratio: 0.95  AORTA Ao Root diam: 3.00 cm MITRAL VALVE MV Area (PHT): 2.17 cm     SHUNTS MV Area VTI:   2.23 cm     Systemic VTI:  0.21 m MV Peak grad:  7.4 mmHg     Systemic Diam: 2.00 cm MV Mean grad:  2.0 mmHg MV Vmax:       1.36 m/s MV Vmean:       70.1 cm/s MV Decel Time: 350 msec MV E velocity: 60.80 cm/s MV A velocity: 117.00 cm/s MV E/A ratio:  0.52 Cristal Deer End MD Electronically signed by Yvonne Kendall MD Signature Date/Time: 02/08/2024/10:43:24 AM    Final    [place] EKG SITE RITE Result Date: 02/08/2024 If Site Rite image not attached, placement could not be confirmed due to current cardiac rhythm.  DG Abd Portable 1V Result Date: 02/08/2024 CLINICAL DATA:  Ileus falling gastrointestinal surgery EXAM: PORTABLE ABDOMEN - 1 VIEW COMPARISON:  CT abdomen pelvis 02/07/2024 FINDINGS: Marked gaseous dilation of the small bowel in the central abdomen measuring up to 6.0 cm. Enteric tube tip and side-port in the stomach. Pelvic catheters or drains. IMPRESSION: Marked gaseous dilation of the small bowel in the central abdomen  measuring up to 6.0 cm. Findings may be due to ileus or obstruction. Electronically Signed   By: Minerva Fester M.D.   On: 02/08/2024 02:19   CT ABDOMEN PELVIS W CONTRAST Result Date: 02/07/2024 CLINICAL DATA:  GI upset history of colon liver and lung cancer EXAM: CT ABDOMEN AND PELVIS WITH CONTRAST TECHNIQUE: Multidetector CT imaging of the abdomen and pelvis was performed using the standard protocol following bolus administration of intravenous contrast. RADIATION DOSE REDUCTION: This exam was performed according to the departmental dose-optimization program which includes automated exposure control, adjustment of the mA and/or kV according to patient size and/or use of iterative reconstruction technique. CONTRAST:  80mL OMNIPAQUE IOHEXOL 300 MG/ML  SOLN COMPARISON:  CT 11/30/2023, 08/05/2023 FINDINGS: Lower chest: Lung bases demonstrate scattered pulmonary nodules consistent with metastatic disease. Slight decreased size of left lower lobe index lesion on series 8, image 8, measures 9 x 9 mm, previously 12 x 12 mm. However there appear to be several new small pulmonary nodules, for example 8 mm left lower lobe pulmonary nodule on  series 4, image 9, central right lung base pulmonary nodule measuring 8 mm on series 4, image 9, and punctate 4 mm right anterior lung base nodule on series 4, image 7. Faint 3 mm right posterior lower lobe pulmonary nodule on series 4, image 10. Hepatobiliary: Hepatic metastatic disease again noted. Interval decrease in size of previously measured lesions, for example caudate lesion measures 38 x 26 mm on series 2, image 23, previously 48 by 35 mm. Right hepatic lobe lesion measures 25 x 25 mm on series 2, image 23, previously 33 x 27 mm. However interim finding of multiple subcentimeter hypodense liver lesions not clearly seen on the prior exam. No biliary dilatation. No calcified gallstones. Pancreas: Unremarkable. No pancreatic ductal dilatation or surrounding inflammatory changes. Spleen: Normal in size without focal abnormality. Adrenals/Urinary Tract: Adrenal glands are normal. Kidneys show no hydronephrosis. Bladder is decompressed Stomach/Bowel: Stomach decompressed. Interval multiple fluid-filled dilated loops of small bowel measuring up to 5.7 cm consistent with bowel obstruction. Point of transition is related to moderate complex ventral hernia which contains mesenteric fat, fluid and slightly thickened small bowel loop. Small bowel distal to this is completely decompressed. Cecal mass is again visualized as prominent wall thickening on coronal series 5, image 56. When measured in a similar fashion compared with the prior exam, measures about 5 x 4.2 cm on series 2, image 56, previously 6.6 x 5 cm. Slightly thickened appearance of the appendix but no definite inflammatory change. Vascular/Lymphatic: Mild aortic atherosclerosis. No aneurysm. No enlarging lymph nodes. Reproductive: Calcified uterine fibroid. Posterior pelvic mass that is probably associated with the right ovary/adnexa is slightly diminished as well 6.7 x 4.6 cm on series 2, image 78, previously 7.2 x 5.5 cm when measured in a similar  fashion. Some mass effect on the adjacent rectosigmoid colon. Other: Trace ascites in the pelvis. Small volume free fluid within the hernia. Small amount of free gas within the hernia sac, series 2, image 68 and series 2, image 67. Musculoskeletal: Stable osseous structures without acute1 finding. IMPRESSION: 1. Interval development of small-bowel obstruction with point of transition related to moderate complex ventral hernia which contains mesenteric fat, fluid and slightly thickened small bowel loop. Small amount of free gas within the hernia sac, concern for incarcerated hernia with possible bowel perforation. Small volume free fluid within the hernia. 2. Interval decrease in size of previously measured hepatic metastatic lesions. However interim finding of multiple  subcentimeter hypodense liver lesions/suspected metastatic lesions not clearly seen on the prior exam. 3. Slight decreased size of cecal mass. Slightly decreased size of posterior pelvic mass that is probably associated with the right ovary/adnexa. 4. Slight decreased size of index left lower lobe pulmonary nodule, however there appear to be several new small pulmonary nodules at the lung bases concerning for metastatic disease. 5. Aortic atherosclerosis. Critical Value/emergent results were called by telephone at the time of interpretation on 02/07/2024 at 9:05 pm to provider Bing Neighbors , who verbally acknowledged these results. Electronically Signed   By: Jasmine Pang M.D.   On: 02/07/2024 21:05    Scheduled Meds:  sodium chloride   Intravenous Once   Chlorhexidine Gluconate Cloth  6 each Topical Daily   heparin  5,000 Units Subcutaneous Q8H   insulin aspart  0-9 Units Subcutaneous Q4H   ketorolac  15 mg Intravenous Q6H   pantoprazole (PROTONIX) IV  40 mg Intravenous Daily   sodium chloride flush  10-40 mL Intracatheter Q12H   thiamine (VITAMIN B1) injection  100 mg Intravenous Daily   Continuous Infusions:  acetaminophen 1,000 mg  (02/08/24 1217)   lactated ringers 125 mL/hr at 02/08/24 0730   piperacillin-tazobactam (ZOSYN)  IV 3.375 g (02/08/24 1401)   TPN ADULT (ION)     PRN Meds: albuterol, morphine injection, ondansetron (ZOFRAN) IV, sodium chloride flush  Time spent: 55 minutes  Author: Gillis Santa. MD [facility] 02/08/2024 3:19 PM  To reach On-call, see care teams to locate the attending and reach out to them via [email or web address]. If 7PM-7AM, please contact night-coverage If you still have difficulty reaching the attending provider, please page the [facility] (Director on Call) for [facility] on amion for assistance.

## 2024-02-08 NOTE — Op Note (Signed)
 PROCEDURES: Laparotomy Small bowel resection Reduction of ventral hernia with Repair strangulated ventral hernia measuring 10.5 cms  Pre-operative Diagnosis: strangulated ventral hernia with obstruction and perforation  Post-operative Diagnosis: same  Surgeon: Merri Ray Dimitri Dsouza    Anesthesia: General endotracheal anesthesia  ASA Class: 4   Surgeon: Sterling Big , MD FACS  Anesthesia: Gen. with endotracheal tube  Findings: Fecal peritonitis Strangulated ventral hernia with perforated small bowel, swiss cheese defect total 10.5 cm  Estimated Blood Loss: 20cc         Drains: 19 blake drain         Specimens: small bowel and hernia sac w omentum          Complications: none              Condition: stable  Procedure Details  The patient was seen again in the Holding Room. The benefits, complications, treatment options, and expected outcomes were discussed with the patient. The risks of bleeding, infection, recurrence of symptoms, failure to resolve symptoms,  bowel injury, any of which could require further surgery were reviewed with the patient.   The patient was taken to Operating Room, identified as Alexis Garcia and the procedure verified.  A Time Out was held and the above information confirmed.  Prior to the induction of general anesthesia, antibiotic prophylaxis was administered. VTE prophylaxis was in place. General endotracheal anesthesia was then administered and tolerated well. After the induction, the abdomen was prepped with Chloraprep and draped in the sterile fashion. The patient was positioned in the supine position.  Midline incision was created over the strangulated hernia there was thick adhesions and it was have very large noncomplex sac.  I was able to dissect the sac from adjacent structures.  I was able to open the sac and found feculent material from fecal peritonitis due to a perforated small bowel.  We did have to maneuver and dissect the sac from adjacent  structures including the colon.  Those adhesions where dissected sharply with scissors and electrocautery.  We did take our time due to the complexity of the hernia.  We were able to restore the anatomy and we actually had to incise the fascia to be able to reduce the hernia.  Once we restored anatomy we found a definitive segment of perforation.  Were able to control the contamination with a figure-of-eight silk.  The succus was aspirated and the wound was irrigated. No bowel resection was required.  Proximal and distal defects within the mesentery were created with electrocautery.  Proximal and distal segment were divided individually with a 75 GIA stapler.  We performed a side-to-side functional end-to-end stapled anastomosis and the common channel was closed with multiple interrupted 2-0 silk sutures.  The mesenteric defect was closed with a running 3-0 Vicryl.  Examined the anastomosis it was widely patent under no tension and with great perfusion.  Also the patient hemodynamics were very reasonable without evidence of shock or fusion issues.  For this reason I decided not to give her an ostomy. The abdominal cavity was profusely irrigated with 3 L of normal saline.  A 19 Blake drain was placed within the intra-abdominal cavity towards the pelvis and secured to the abdominal wall in the standard fashion. Liposomal Marcaine  was injected under direct visualization to perform bilateral TAP blocks. THe hernia defect measured total 10.5 cm siws cheese defect. I closed it primarily with multiple interrupted 0 Ethibonds. Given the fecal peritonitis and degree of contamination I decided not  to close the skin. Kerlix wrap was used as a wet to dry to cover the wound and sterile dressing placed.  Needle and laparotomy count were correct and there were no immediate complications.  Sterling Big, MD, FACS

## 2024-02-08 NOTE — Progress Notes (Signed)
 Initial Nutrition Assessment  DOCUMENTATION CODES:   Non-severe (moderate) malnutrition in context of chronic illness  INTERVENTION:   -TPN management per pharmacy -Recommend MVI in TPN -Recommend 100 mg thiamine daily x 7 days -Monitor Mg, K, and Phos and replete as needed secondary to high refeeding risk -RD will follow for ability to advance to PO diet/ enteral nutrition c NUTRITION DIAGNOSIS:   Moderate Malnutrition related to chronic illness (stage IV colon cancer) as evidenced by mild fat depletion, moderate fat depletion, mild muscle depletion, moderate muscle depletion.  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  The patient reports nausea and vomiting since 02/03/24 and has noticed that her umbilical hernia is unreducible with increasing abdominal pain when it is touched along with nausea and vomiting since then.  She denies any flatus or bowel movement since 02/03/24.  Pt admitted with small bowel obstruction with incarcerated hernia and possible perforation   3/31- s/p Laparotomy; Small bowel resection; Reduction of ventral hernia with Repair strangulated ventral hernia measuring 10.5 cms  Reviewed I/O's: +1.9 L x 24 hours  UOP: 450 ml x 24 hours  NGT output: 320 ml x 24 hours  Drain output: 50 ml x 24 hours   Per KUB today, NGT tip and side-port in the stomach. Tube currently connected to low, intermittent suction.   Pt familiar to this RD due to prior hospital admission. Pt reports that she is is grateful that she came to the hospital, but feels overwhelmed regarding recovery course. Pt  reports that since last hospitalization, she was diagnosed with colon cancer and has been undergoing chemotherapy (last treatment last week) and she has been tolerating relatively well up until a week ago. Per pt, she has been unable to hold down foods and liquids since 02/01/24 ("even water and pedialyte I couldn't keep down".). Prior to this, pt reports good appetite and consuming 3 meals per day. Pt also noted that she has been gaining weight secondary to improved oral intake prior to acute illness.   Noted wt gain over the past 3 months, which is favorable given pt's malnutrition. Pt reports weakness over the past 2 weeks, stating it is difficult for her transfer from bed to chair.   Discussed rationale for NPO status, NGT , and plan for TPN. Pt amenable to this. RD also discussed needs with general surgery and pharmacist.   Medications reviewed and include protonix, potassium chloride, and lactated ringers infusion @ 125 ml/hr.   Lab Results  Component Value Date   HGBA1C 6.5 (H) 02/07/2024   PTA DM medications are 500 mg metformin BID.   Labs reviewed: K: 3.0 (on IV supplementation), CBGS: 144-149 (inpatient orders for glycemic control are 0-9 units insulin asapart every 4 hours).    NUTRITION - FOCUSED PHYSICAL EXAM:  Flowsheet Row Most Recent Value  Orbital Region Moderate depletion  Upper Arm Region Moderate depletion  Thoracic and Lumbar Region Mild depletion  Buccal Region Moderate depletion  Temple Region Mild depletion  Clavicle Bone Region Moderate depletion  Clavicle and Acromion Bone Region Moderate depletion  Scapular Bone Region Moderate depletion  Dorsal  Hand Moderate depletion  Patellar Region Moderate depletion  Anterior Thigh Region Moderate depletion  Posterior Calf Region Moderate depletion  Edema (RD Assessment) None  Hair Reviewed  Eyes Reviewed  Mouth Reviewed  Skin Reviewed  Nails Reviewed       Diet Order:   Diet Order             Diet NPO time  specified  Diet effective now                   EDUCATION NEEDS:   Education needs have been addressed  Skin:  Skin Assessment: Skin Integrity Issues: Skin Integrity Issues:: Incisions Incisions: closed abdomen  Last BM:  Unknown  Height:   Ht Readings from Last 1 Encounters:  02/07/24 5\' 6"  (1.676 m)    Weight:   Wt Readings from Last 1 Encounters:  02/07/24 81.2 kg    Ideal Body Weight:  59.1 kg  BMI:  Body mass index is 28.89 kg/m.  Estimated Nutritional Needs:   Kcal:  2050-2250  Protein:  105-120 grams  Fluid:  2-2.2 L    Levada Schilling, RD, LDN, CDCES Registered Dietitian III Certified Diabetes Care and Education Specialist If unable to reach this RD, please use "RD Inpatient" group chat on secure chat between hours of 8am-4 pm daily

## 2024-02-08 NOTE — Progress Notes (Addendum)
 [facility] SURGICAL PROGRESS NOTE  Hospital Day(s): 1.   Post op day(s): 1 Day Post-Op.   Interval History:  Patient seen and examined same day as surgery No acute events or new complaints overnight.  Patient reports she is fairly comfortable this AM No fever, chills, nausea WBC normal at 4.3K Hgb to 11.9 AKI improved some; sCr - 1.48; UO - 450 ccs NGT in place; 320 ccs Surgical drain; 50 ccs - serous She is on Zosyn NPO  Vital signs in last 24 hours: [min-max] current  Temp:  [97.3 F (36.3 C)-99.3 F (37.4 C)] 97.6 F (36.4 C) (03/31 0320) Pulse Rate:  [71-120] 79 (03/31 0320) Resp:  [15-31] 17 (03/31 0159) BP: (116-145)/(64-102) 121/74 (03/31 0320) SpO2:  [97 %-100 %] 100 % (03/31 0320) Weight:  [81.2 kg] 81.2 kg (03/30 2045)     Height: 5\' 6"  (167.6 cm) Weight: 81.2 kg BMI (Calculated): 28.91   Intake/Output last 2 shifts:  03/30 0701 - 03/31 0700 In: 2763.4 [I.V.:1500; IV Piggyback:1243.4] Out: 820 [Urine:450; Emesis/NG output:320; Drains:50]   Physical Exam:  Constitutional: alert, cooperative and no distress  HEENT: NGT in place Respiratory: breathing non-labored at rest  Cardiovascular: regular rate and sinus rhythm  Gastrointestinal: soft, and non-distended, no rebound/guarding. Drain in left abdomen; serous  Integumentary: Laparotomy healing via secondary intention, dressing in place   Labs:     Latest Ref Rng & Units 02/08/2024    2:11 AM 02/07/2024    6:07 PM 02/01/2024    8:27 AM  CBC  WBC 4.0 - 10.5 K/uL 4.3  7.0  3.2   Hemoglobin 12.0 - 15.0 g/dL 40.9  81.1  91.4   Hematocrit 36.0 - 46.0 % 35.0  43.8  31.5   Platelets 150 - 400 K/uL 149  255  149       Latest Ref Rng & Units 02/08/2024    2:11 AM 02/07/2024    6:07 PM 02/01/2024    8:27 AM  CMP  Glucose 70 - 99 mg/dL 782  956  95   BUN 6 - 20 mg/dL 68  70  13   Creatinine 0.44 - 1.00 mg/dL 2.13  0.86  5.78   Sodium 135 - 145 mmol/L 135  134  137   Potassium 3.5 - 5.1 mmol/L  3.0  3.7  3.5   Chloride 98 - 111 mmol/L 95  91  104   CO2 22 - 32 mmol/L 24  21  25    Calcium 8.9 - 10.3 mg/dL 7.6  9.1  8.4   Total Protein 6.5 - 8.1 g/dL 6.6  8.8  7.1   Total Bilirubin 0.0 - 1.2 mg/dL 1.1  1.3  0.8   Alkaline Phos 38 - 126 U/L 53  71  60   AST 15 - 41 U/L 26  32  38   ALT 0 - 44 U/L 20  26  24      Imaging studies: No new pertinent imaging studies   Assessment/Plan:  63 y.o. female 1 Day Post-Op s/p exploratory laparotomy, small bowel resection, repair of ventral hernia (10.5 cm) for incarcerated ventral hernia with small bowel obstruction, perforation, and feculent peritonitis, complicated by pertinent comorbidities including IV colon cancer with metastasis to the lung, uterus and liver    - Will consult for TPN today; No significant PO intake x1 week and suspect prolonged NPO status post-operatively - She does have port we can utilize this for TPN  - Continue NGT decompression; LIS;  monitor and record output   - Continue foley catheter today; monitor OU - did have AKI  - Continue surgical drain; monitor and record output  - Continue IV Abx (Zosyn)  - Wound Care: Daily and PRN dressing changes with saline moistened Kerlix gauze, cover, secure.   - Monitor abdominal examination; on-going bowel function  - Pain control prn; antiemetics prn - Mobilize; engage therapies    - Further management per primary service; we will follow    All of the above findings and recommendations were discussed with the patient, and the medical team, and all of patient's questions were answered to her  expressed satisfaction.  -- Lynden Oxford, PA-C [facility] 02/08/2024, 7:05 AM M-F: 7am - 4pm

## 2024-02-08 NOTE — Transfer of Care (Signed)
 Immediate Anesthesia Transfer of Care Note  Patient: Alexis Garcia  Procedure(s) Performed: Procedure(s): LAPAROTOMY, EXPLORATORY SMALL BOWEL RESECTION HERNIA REPAIR (N/A)  Patient Location: PACU  Anesthesia Type:General  Level of Consciousness: sedated  Airway & Oxygen Therapy: Patient Spontanous Breathing and Patient connected to face mask oxygen  Post-op Assessment: Report given to RN and Post -op Vital signs reviewed and stable  Post vital signs: Reviewed and stable  Last Vitals:  Vitals:   02/07/24 2045 02/08/24 0105  BP:  (!) 141/75  Pulse: 83 71  Resp: 16 (!) 31  Temp:  37.4 C  SpO2: 97% 100%    Complications: No apparent anesthesia complications

## 2024-02-08 NOTE — Consult Note (Signed)
 PHARMACY - TOTAL PARENTERAL NUTRITION CONSULT NOTE   Indication: Prolonged ileus  Patient Measurements: Height: 5\' 6"  (167.6 cm) Weight: 81.2 kg (179 lb 0.2 oz) IBW/kg (Calculated) : 59.3 TPN AdjBW (KG): 64.8 Body mass index is 28.89 kg/m. Usual Weight: 77.3 kg   Assessment:  63 y.o. female s/p exploratory laparotomy, small bowel resection, repair of ventral hernia (10.5 cm) for incarcerated ventral hernia with small bowel obstruction, perforation, and feculent peritonitis, complicated by pertinent comorbidities including IV colon cancer with metastasis to the lung, uterus and liver. She meets criteria for malnutrition and last PO intake was 3/24.   Glucose / Insulin: BG 136-149; Insulin aspart (24 hours): 1 unit Electrolytes:  Na 135 K+ 3 - replaced with Kcl 10 mEq IV x 5  Mg 1.9  Phos 3.7  Anion gap 22 > 16.  Renal: Pt in AKI. 1.77 > 1.48 (baseline 0.66) Hepatic: LFTs wnl.  Intake / Output; MIVF: LR @ 125 ml/hr x 24 hours.  GI Imaging: Abdomen and pelvis CT:  1. Interval development of small-bowel obstruction with point of transition related to moderate complex ventral hernia which contains mesenteric fat, fluid and slightly thickened small bowel loop. Small amount of free gas within the hernia sac, concern for incarcerated hernia with possible bowel perforation. Small volume free fluid within the hernia. 2. Interval decrease in size of previously measured hepatic metastatic lesions. However interim finding of multiple subcentimeter hypodense liver lesions/suspected metastatic lesions not clearly seen on the prior exam. 3. Slight decreased size of cecal mass. Slightly decreased size of posterior pelvic mass that is probably associated with the right ovary/adnexa. 4. Slight decreased size of index left lower lobe pulmonary nodule, however there appear to be several new small pulmonary nodules at the lung bases concerning for metastatic disease. 5. Aortic atherosclerosis. GI  Surgeries / Procedures: s/p exploratory laparotomy, small bowel resection, repair of ventral hernia  Central access: plan for 3/31 TPN start date: plan for 3/31   Nutritional Goals: Goal TPN rate is 83 mL/hr (provides 106.85 g of protein and 2059 kcals per day)  RD Assessment: Estimated Needs Total Energy Estimated Needs: 2050-2250 Total Protein Estimated Needs: 105-120 grams Total Fluid Estimated Needs: 2-2.2 L  Current Nutrition:  TPN  Plan:  Start TPN at 28 mL/hr at 1800 (1/3 of goal rate). Pt would not need Kcal goal of at least 75% with the clinimix so will start compounded TPN.  - Protein 35.62 g (142.48 kcal) - Dextrose 100.8 g (343.72 kcal) - Lipids 20.2 g (202 kcal) Electrolytes in TPN: Na 45mEq/L, K 2mEq/L, Ca 80mEq/L, Mg 50mEq/L, and Phos 93mmol/L. Cl:Ac 1:1 - will start thiamine 100 mg IV x 7 days outpatient of TPN bag. Holding off on chromium.  Add standard MVI and trace elements to TPN Initiate Sensitive q4h SSI and adjust as needed  MIVF to stop at 22:00 Monitor TPN labs on Mon/Thurs.  Case discussed with RD.   Ronnald Ramp, PharmD, BCPS 02/08/2024,9:50 AM

## 2024-02-08 NOTE — Plan of Care (Signed)
  Problem: Coping: Goal: Ability to adjust to condition or change in health will improve Outcome: Progressing   Problem: Fluid Volume: Goal: Ability to maintain a balanced intake and output will improve Outcome: Progressing   Problem: Health Behavior/Discharge Planning: Goal: Ability to identify and utilize available resources and services will improve Outcome: Progressing   Problem: Skin Integrity: Goal: Risk for impaired skin integrity will decrease Outcome: Progressing

## 2024-02-08 NOTE — Plan of Care (Signed)

## 2024-02-08 NOTE — Progress Notes (Signed)
*  PRELIMINARY RESULTS* Echocardiogram 2D Echocardiogram has been performed.  Cristela Blue 02/08/2024, 9:54 AM

## 2024-02-09 ENCOUNTER — Inpatient Hospital Stay

## 2024-02-09 DIAGNOSIS — K5652 Intestinal adhesions [bands] with complete obstruction: Secondary | ICD-10-CM | POA: Diagnosis not present

## 2024-02-09 LAB — CBC
HCT: 29.9 % — ABNORMAL LOW (ref 36.0–46.0)
Hemoglobin: 9.9 g/dL — ABNORMAL LOW (ref 12.0–15.0)
MCH: 30.5 pg (ref 26.0–34.0)
MCHC: 33.1 g/dL (ref 30.0–36.0)
MCV: 92 fL (ref 80.0–100.0)
Platelets: 127 10*3/uL — ABNORMAL LOW (ref 150–400)
RBC: 3.25 MIL/uL — ABNORMAL LOW (ref 3.87–5.11)
RDW: 16.1 % — ABNORMAL HIGH (ref 11.5–15.5)
WBC: 7.5 10*3/uL (ref 4.0–10.5)
nRBC: 0 % (ref 0.0–0.2)

## 2024-02-09 LAB — GLUCOSE, CAPILLARY
Glucose-Capillary: 114 mg/dL — ABNORMAL HIGH (ref 70–99)
Glucose-Capillary: 114 mg/dL — ABNORMAL HIGH (ref 70–99)
Glucose-Capillary: 124 mg/dL — ABNORMAL HIGH (ref 70–99)
Glucose-Capillary: 126 mg/dL — ABNORMAL HIGH (ref 70–99)
Glucose-Capillary: 142 mg/dL — ABNORMAL HIGH (ref 70–99)
Glucose-Capillary: 143 mg/dL — ABNORMAL HIGH (ref 70–99)
Glucose-Capillary: 87 mg/dL (ref 70–99)

## 2024-02-09 LAB — MAGNESIUM: Magnesium: 2.1 mg/dL (ref 1.7–2.4)

## 2024-02-09 LAB — BASIC METABOLIC PANEL WITH GFR
Anion gap: 7 (ref 5–15)
Anion gap: 7 (ref 5–15)
BUN: 66 mg/dL — ABNORMAL HIGH (ref 6–20)
BUN: 53 mg/dL — ABNORMAL HIGH (ref 6–20)
CO2: 27 mmol/L (ref 22–32)
CO2: 25 mmol/L (ref 22–32)
Calcium: 7.8 mg/dL — ABNORMAL LOW (ref 8.9–10.3)
Calcium: 7.7 mg/dL — ABNORMAL LOW (ref 8.9–10.3)
Chloride: 101 mmol/L (ref 98–111)
Chloride: 102 mmol/L (ref 98–111)
Creatinine, Ser: 1.31 mg/dL — ABNORMAL HIGH (ref 0.44–1.00)
Creatinine, Ser: 0.97 mg/dL (ref 0.44–1.00)
GFR, Estimated: 47 mL/min — ABNORMAL LOW (ref >60)
GFR, Estimated: >60 mL/min (ref >60)
Glucose, Bld: 121 mg/dL — ABNORMAL HIGH (ref 70–99)
Glucose, Bld: 156 mg/dL — ABNORMAL HIGH (ref 70–99)
Potassium: 3 mmol/L — ABNORMAL LOW (ref 3.5–5.1)
Potassium: 3.7 mmol/L (ref 3.5–5.1)
Sodium: 135 mmol/L (ref 135–145)
Sodium: 134 mmol/L — ABNORMAL LOW (ref 135–145)

## 2024-02-09 LAB — PHOSPHORUS: Phosphorus: 2.2 mg/dL — ABNORMAL LOW (ref 2.5–4.6)

## 2024-02-09 LAB — TRIGLYCERIDES: Triglycerides: 81 mg/dL (ref <150)

## 2024-02-09 LAB — SURGICAL PATHOLOGY

## 2024-02-09 MED ORDER — POTASSIUM CHLORIDE 10 MEQ/100ML IV SOLN
10.0000 meq | INTRAVENOUS | Status: AC
  Administered 2024-02-09 (×5): 10 meq via INTRAVENOUS
  Filled 2024-02-09 (×6): qty 100

## 2024-02-09 MED ORDER — DEXTROSE 70 % IV SOLN
INTRAVENOUS | Status: AC
  Filled 2024-02-09: qty 508.8

## 2024-02-09 MED ORDER — POTASSIUM PHOSPHATES 15 MMOLE/5ML IV SOLN
30.0000 mmol | Freq: Once | INTRAVENOUS | Status: AC
  Administered 2024-02-09: 30 mmol via INTRAVENOUS
  Filled 2024-02-09: qty 10

## 2024-02-09 NOTE — Evaluation (Signed)
 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  The patient reports nausea and vomiting since since Wednesday of this week has noticed that her umbilical hernia is unreducible with increasing abdominal pain when it is touched along with nausea and vomiting since then.  She denies any flatus or bowel movement since Wednesday.  The patient denies any chest pain fever chills or shortness of breath. S/P laporatomy 02/08/24.   Clinical Impression  Pt admitted with above diagnosis. Pt currently with functional limitations due to the deficits listed below (see PT Problem List). Pt received supine in bed agreeable to PT eval. PTA pt reports no longer working, however remains independent with her mobility, ADL's and IADL's.   Pt mod-I with bed mobility and supervision to STS with SUE support on IV pole. Light use of IV pole during reciprocal gait bout with minor need for balance but really used as necessity for lines and lead/equipment management. Pt completes > 300' of gait with SPO2 and HR WNL throughout. Despite generalized weakness from acute condition, anticipate pt is not far from her baseline. PT returned to recliner with all needs in reach. PT to continue to address acute mobility deficits to maximize function prior to discharge. PT to plan for d/c recs to address these deficits.      If plan is discharge home, recommend the following: Assistance with cooking/housework;Assist for transportation;Help with stairs or ramp for entrance   Can travel by private vehicle        Equipment Recommendations None recommended by PT   Recommendations for Other Services       Functional Status Assessment Patient has had a recent decline in their functional status and demonstrates the ability to make significant improvements in function in a reasonable and predictable amount of time.     Precautions / Restrictions Precautions Precautions: Fall Recall of Precautions/Restrictions: Intact Restrictions Weight Bearing Restrictions Per Provider Order: No      Mobility  Bed Mobility Overal bed mobility: Modified Independent             General bed mobility comments: using bed features Patient Response: Cooperative  Transfers Overall transfer level: Needs assistance Equipment used:  (IV pole) Transfers: Sit to/from Stand Sit to Stand: Supervision           General transfer comment: SUE support on IV pole and bed to stand    Ambulation/Gait Ambulation/Gait assistance: Supervision Gait Distance (Feet): 306 Feet Assistive device: IV Pole Gait Pattern/deviations: Step-through pattern       General Gait Details: gentle BUE support on IV pole for equipment management. Light use for balance.  Stairs            Wheelchair Mobility     Tilt Bed Tilt Bed Patient Response: Cooperative  Modified Rankin (Stroke Patients Only)       Balance Overall balance assessment: Mild deficits observed, not formally tested                                           Pertinent Vitals/Pain Pain Assessment Pain Assessment:  0-10 Pain Score: 5  Pain Location: abdomen Pain Descriptors / Indicators: Discomfort Pain Intervention(s): Limited activity within patient's tolerance, Monitored during session, Repositioned    Home Living Family/patient expects to be discharged to:: Private residence Living Arrangements: Children Available Help at Discharge: Family;Available PRN/intermittently Type of Home: House Home Access: Stairs to enter Entrance Stairs-Rails: Can reach  both;Right;Left Entrance Stairs-Number of Steps: 3   Home Layout: One level Home Equipment: Agricultural consultant (2 wheels)      Prior Function Prior Level of Function : Independent/Modified Independent             Mobility Comments: No longer working but remains independent with ADL's/IADL's.       Extremity/Trunk Assessment   Upper Extremity Assessment Upper Extremity Assessment: Overall WFL for tasks assessed    Lower Extremity Assessment Lower Extremity Assessment: Generalized weakness    Cervical / Trunk Assessment Cervical / Trunk Assessment: Normal  Communication   Communication Communication: No apparent difficulties    Cognition Arousal: Alert Behavior During Therapy: WFL for tasks assessed/performed   PT - Cognitive impairments: No apparent impairments                       PT - Cognition Comments: pleasant and cooperative Following commands: Intact       Cueing Cueing Techniques: Verbal cues     General Comments      Exercises Other Exercises Other Exercises: Role of PT in acute setting. Benefits of OOB mobility for GI motility.   Assessment/Plan    PT Assessment Patient needs continued PT services  PT Problem List Decreased strength;Decreased activity tolerance;Decreased balance;Decreased mobility;Pain       PT Treatment Interventions Neuromuscular re-education;Gait training;Stair training;Patient/family education;Functional mobility training;Therapeutic activities;Therapeutic exercise;Balance training    PT Goals (Current goals can be found in the Care Plan section)  Acute Rehab PT Goals Patient Stated Goal: to return home PT Goal Formulation: With patient Time For Goal Achievement: 02/23/24 Potential to Achieve Goals: Good    Frequency Min 3X/week     Co-evaluation               AM-PAC PT "6 Clicks" Mobility  Outcome Measure Help needed turning from your back to your side while in a flat bed without using bedrails?:  None Help needed moving from lying on your back to sitting on the side of a flat bed without using bedrails?: None Help needed moving to and from a bed to a chair (including a wheelchair)?: A Little Help needed standing up from a chair using your arms (e.g., wheelchair or bedside chair)?: A Little Help needed to walk in hospital room?: A Little Help needed climbing 3-5 steps with a railing? : A Little 6 Click Score: 20    End of Session Equipment Utilized During Treatment: Oxygen Activity Tolerance: Patient tolerated treatment well Patient left: in chair;with call bell/phone within reach;with chair alarm set Nurse Communication: Mobility status PT Visit Diagnosis: Other abnormalities of gait and mobility (R26.89);Muscle weakness (generalized) (M62.81);Pain    Time: 1020-1048 PT Time Calculation (min) (ACUTE ONLY): 28 min   Charges:     PT Treatments $Therapeutic Activity: 8-22 mins PT General Charges $$ ACUTE PT VISIT: 1 Visit        Delphia Grates. Fairly IV, PT, DPT Physical Therapist- [place]  [facility]  02/09/2024, 11:19 AM

## 2024-02-09 NOTE — Evaluation (Signed)
 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  The patient reports nausea and vomiting since since Wednesday of this week has noticed that her umbilical hernia is unreducible with increasing abdominal pain when it is touched along with nausea and vomiting since then.  She denies any flatus or bowel movement since Wednesday.  The patient denies any chest pain fever chills or shortness of breath. S/P laporatomy 02/08/24.     Clinical Impressions Patient presenting with decreased Ind in self care,balance, functional mobility/transfers, endurance, and safety awareness. Patient reports being Ind at baseline and lives with family. Pt  endorses needing to use RW in the last week secondary to increasing weakness at home. Pt is motivated for self care and mobility tasks.She ambulates in room while pushing IV pole with supervision overall to sink for standing grooming tasks. Pt returning to recliner chair at end of session with call bell and all needed items within reach.  Patient will benefit from acute OT to increase overall independence in the areas of ADLs, functional mobility, and safety awareness in order to safely discharge.      If plan is discharge home, recommend the following:   A little help with walking and/or transfers;A little help with bathing/dressing/bathroom;Assistance with cooking/housework;Assist for transportation;Help with stairs or ramp for entrance     Functional Status Assessment   Patient has had a recent decline in their functional status and demonstrates the ability to make significant improvements  in function in a reasonable and predictable amount of time.     Equipment Recommendations   None recommended by OT      Precautions/Restrictions   Precautions Precautions: Fall Recall of Precautions/Restrictions: Intact Restrictions Weight Bearing Restrictions Per Provider Order: No     Mobility Bed Mobility               General bed mobility comments: seated in recliner chair at beginning/end of session    Transfers Overall transfer level: Needs assistance   Transfers: Sit to/from Stand Sit to Stand: Supervision                  Balance Overall balance assessment: Mild deficits observed, not formally tested                                         ADL either performed or assessed with clinical judgement   ADL Overall ADL's : Needs assistance/impaired                                       General ADL Comments: supervision overall for functional transfers with use of IV pole and standing at sink for grooming tasks     Vision Patient Visual Report: No change from baseline              Pertinent Vitals/Pain Pain Assessment Pain Assessment: 0-10 Pain Score: 5  Pain Location: abdomen Pain Descriptors / Indicators: Discomfort Pain Intervention(s): Limited activity within patient's tolerance, Monitored during session,  Repositioned     Extremity/Trunk Assessment Upper Extremity Assessment Upper Extremity Assessment: Generalized weakness   Lower Extremity Assessment Lower Extremity Assessment: Generalized weakness   Cervical / Trunk Assessment Cervical / Trunk Assessment: Normal   Communication Communication Communication: No apparent difficulties   Cognition Arousal: Alert Behavior During Therapy: WFL for tasks assessed/performed Cognition: No apparent impairments                               Following commands: Intact       Cueing  General Comments   Cueing Techniques: Verbal  cues              Home Living Family/patient expects to be discharged to:: Private residence Living Arrangements: Children Available Help at Discharge: Family;Available PRN/intermittently Type of Home: House Home Access: Stairs to enter [employer] of Steps: 3 Entrance Stairs-Rails: Can reach both;Right;Left Home Layout: One level         Bathroom Toilet: Standard Bathroom Accessibility: No   Home Equipment: Agricultural consultant (2 wheels)          Prior Functioning/Environment Prior Level of Function : Independent/Modified Independent             Mobility Comments: No longer working but remains independent with ADL's/IADL's. ADLs Comments: IND with ADLs/IADLs    OT Problem List: Decreased strength;Decreased activity tolerance;Decreased safety awareness;Impaired balance (sitting and/or standing);Decreased knowledge of use of DME or AE   OT Treatment/Interventions: Self-care/ADL training;Therapeutic exercise;Therapeutic activities;Energy conservation;DME and/or AE instruction;Patient/family education;Balance training      OT Goals(Current goals can be found in the care plan section)   Acute Rehab OT Goals Patient Stated Goal: to feel better and go home OT Goal Formulation: With patient Time For Goal Achievement: 02/23/24 Potential to Achieve Goals: Fair ADL Goals Pt Will Perform Lower Body Dressing: with modified independence;sit to/from stand Pt Will Transfer to Toilet: with modified independence;ambulating Pt Will Perform Toileting - Clothing Manipulation and hygiene: with modified independence;sit to/from stand   OT Frequency:  Min 2X/week       AM-PAC OT "6 Clicks" Daily Activity     Outcome Measure Help from another person eating meals?: None Help from another person taking care of personal grooming?: None Help from another person toileting, which includes using toliet, bedpan, or urinal?: A Little Help from another person bathing (including  washing, rinsing, drying)?: A Little Help from another person to put on and taking off regular upper body clothing?: None Help from another person to put on and taking off regular lower body clothing?: A Little 6 Click Score: 21   End of Session Equipment Utilized During Treatment: Oxygen (2Ls) Nurse Communication: Mobility status  Activity Tolerance: Patient tolerated treatment well Patient left: with call bell/phone within reach;in chair;with chair alarm set  OT Visit Diagnosis: Unsteadiness on feet (R26.81);Muscle weakness (generalized) (M62.81)                Time: 7829-5621 OT Time Calculation (min): 24 min Charges:  OT General Charges $OT Visit: 1 Visit OT Evaluation $OT Eval Low Complexity: 1 Low OT Treatments $Self Care/Home Management : 8-22 mins Jackquline Denmark, MS, OTR/L , CBIS ascom [phone]  02/09/24, 1:56 PM

## 2024-02-09 NOTE — Progress Notes (Signed)
 [facility] SURGICAL PROGRESS NOTE  Hospital Day(s): 2.   Post op day(s): 2 Days Post-Op.   Interval History:  Patient seen and examined same day as surgery No acute events or new complaints overnight.  Patient reports she is doing well considering No fever, chills, nausea WBC normal at 7.5K Hgb to 9.9; suspect this is dilutional AKI improved some; sCr - 1.31; UO - 375 ccs Hypokalemia to 3.0 Hypophosphatemia to 2.2 NGT in place; 1800 ccs Surgical drain; 305 ccs - serous She is on Zosyn NPO  Vital signs in last 24 hours: [min-max] current  Temp:  [97.5 F (36.4 C)-98.4 F (36.9 C)] 97.5 F (36.4 C) (04/01 0345) Pulse Rate:  [56-69] 56 (04/01 0345) Resp:  [18-20] 20 (04/01 0345) BP: (124-151)/(59-89) 151/89 (04/01 0345) SpO2:  [100 %] 100 % (04/01 0345)     Height: 5\' 6"  (167.6 cm) Weight: 81.2 kg BMI (Calculated): 28.91   Intake/Output last 2 shifts:  03/31 0701 - 04/01 0700 In: -  Out: 2480 [Urine:375; Emesis/NG output:1800; Drains:305]   Physical Exam:  Constitutional: alert, cooperative and no distress  HEENT: NGT in place Respiratory: breathing non-labored at rest  Cardiovascular: regular rate and sinus rhythm  Gastrointestinal: soft, and non-distended, no rebound/guarding. Drain in left abdomen; serous  Genitourinary: Foley in place; good UO Integumentary: Laparotomy healing via secondary intention, no drainage, no erythema   Labs:     Latest Ref Rng & Units 02/09/2024    4:45 AM 02/08/2024    2:11 AM 02/07/2024    6:07 PM  CBC  WBC 4.0 - 10.5 K/uL 7.5  4.3  7.0   Hemoglobin 12.0 - 15.0 g/dL 9.9  19.1  47.8   Hematocrit 36.0 - 46.0 % 29.9  35.0  43.8   Platelets 150 - 400 K/uL 127  149  255       Latest Ref Rng & Units 02/09/2024    4:45 AM 02/08/2024    2:11 AM 02/07/2024    6:07 PM  CMP  Glucose 70 - 99 mg/dL 295  621  308   BUN 6 - 20 mg/dL 66  68  70   Creatinine 0.44 - 1.00 mg/dL 6.57  8.46  9.62   Sodium 135 - 145 mmol/L 135  135   134   Potassium 3.5 - 5.1 mmol/L 3.0  3.0  3.7   Chloride 98 - 111 mmol/L 101  95  91   CO2 22 - 32 mmol/L 27  24  21    Calcium 8.9 - 10.3 mg/dL 7.8  7.6  9.1   Total Protein 6.5 - 8.1 g/dL  6.6  8.8   Total Bilirubin 0.0 - 1.2 mg/dL  1.1  1.3   Alkaline Phos 38 - 126 U/L  53  71   AST 15 - 41 U/L  26  32   ALT 0 - 44 U/L  20  26     Imaging studies: No new pertinent imaging studies   Assessment/Plan:  63 y.o. female 2 Days Post-Op s/p exploratory laparotomy, small bowel resection, repair of ventral hernia (10.5 cm) for incarcerated ventral hernia with small bowel obstruction, perforation, and feculent peritonitis, complicated by pertinent comorbidities including IV colon cancer with metastasis to the lung, uterus and liver    - Continue TPN; advance to goal; monitor electrolytes   - Continue NGT decompression; LIS; monitor and record output   - Discontinue foley catheter today  - Continue surgical drain; monitor and record output  -  Continue IV Abx (Zosyn)  - Wound Care: Daily and PRN dressing changes with saline moistened Kerlix gauze, cover, secure.   - Monitor abdominal examination; on-going bowel function  - Pain control prn; antiemetics prn - Mobilize; engage therapies    - Further management per primary service; we will follow    All of the above findings and recommendations were discussed with the patient, and the medical team, and all of patient's questions were answered to her expressed satisfaction.  -- Lynden Oxford, PA-C [facility] 02/09/2024, 7:24 AM M-F: 7am - 4pm

## 2024-02-09 NOTE — Consult Note (Signed)
 PHARMACY - TOTAL PARENTERAL NUTRITION CONSULT NOTE   Indication: Prolonged ileus  Patient Measurements: Height: 5\' 6"  (167.6 cm) Weight: 81.2 kg (179 lb 0.2 oz) IBW/kg (Calculated) : 59.3 TPN AdjBW (KG): 64.8 Body mass index is 28.89 kg/m. Usual Weight: 77.3 kg   Assessment:  63 y.o. female s/p exploratory laparotomy, small bowel resection, repair of ventral hernia (10.5 cm) for incarcerated ventral hernia with small bowel obstruction, perforation, and feculent peritonitis, complicated by pertinent comorbidities including IV colon cancer with metastasis to the lung, uterus and liver. She meets criteria for malnutrition and last PO intake was 3/24.   Glucose / Insulin: BG < 120 Electrolytes:  Na 135 ,K+ 3 - replaced by MD 4/1 Mg 2.1 , Phos 2.2 - replaced by MD 4/1 Anion gap 22 > 16.  Renal: Pt in AKI. 1.77 > 1.48>1.31 (baseline 0.66) Hepatic: LFTs wnl.  Intake / Output; MIVF: completed GI Imaging: Abdomen and pelvis CT:  1. Interval development of small-bowel obstruction with point of transition related to moderate complex ventral hernia which contains mesenteric fat, fluid and slightly thickened small bowel loop. Small amount of free gas within the hernia sac, concern for incarcerated hernia with possible bowel perforation. Small volume free fluid within the hernia. 2. Interval decrease in size of previously measured hepatic metastatic lesions. However interim finding of multiple subcentimeter hypodense liver lesions/suspected metastatic lesions not clearly seen on the prior exam. 3. Slight decreased size of cecal mass. Slightly decreased size of posterior pelvic mass that is probably associated with the right ovary/adnexa. 4. Slight decreased size of index left lower lobe pulmonary nodule, however there appear to be several new small pulmonary nodules at the lung bases concerning for metastatic disease. 5. Aortic atherosclerosis. GI Surgeries / Procedures: s/p exploratory  laparotomy, small bowel resection, repair of ventral hernia  Central access: plan for 3/31 TPN start date: plan for 3/31   Nutritional Goals: Goal TPN rate is 83 mL/hr (provides 106.85 g of protein and 2059 kcals per day)  RD Assessment: Estimated Needs Total Energy Estimated Needs: 2050-2250 Total Protein Estimated Needs: 105-120 grams Total Fluid Estimated Needs: 2-2.2 L  Current Nutrition:  TPN  Plan:  Increased TPN rate to 40 mL/hr at 1800. Pt would not meet kcal goal of at least 75% with the Clinimix, so will start compounded TPN.  Protein 50 g (288kcal), Dextrose 144 g (489.6kcal), Lipids 28.8 g Electrolytes in TPN: Na 53mEq/L, K 35mEq/L, Ca 32mEq/L, Mg 80mEq/L, and Phos 55mmol/L. Cl:Ac 1:1 Continue thiamine 100 mg IV x 7 days outpatient of TPN bag. Holding off on chromium.  Add standard MVI and trace elements to TPN Initiate Sensitive q4h SSI and adjust as needed  Monitor TPN labs on Mon/Thurs.   Tameyah Koch Rodriguez-Guzman PharmD, BCPS 02/09/2024 10:13 AM

## 2024-02-09 NOTE — Anesthesia Postprocedure Evaluation (Signed)
 Anesthesia Post Note  Patient: Alexis Garcia  Procedure(s) Performed: LAPAROTOMY, EXPLORATORY SMALL BOWEL RESECTION HERNIA REPAIR  Patient location during evaluation: PACU Anesthesia Type: General Level of consciousness: awake and alert Pain management: pain level controlled Vital Signs Assessment: post-procedure vital signs reviewed and stable Respiratory status: spontaneous breathing, nonlabored ventilation, respiratory function stable and patient connected to nasal cannula oxygen Cardiovascular status: blood pressure returned to baseline and stable Postop Assessment: no apparent nausea or vomiting Anesthetic complications: no   No notable events documented.   Last Vitals:  Vitals:   02/08/24 2340 02/09/24 0345  BP: 136/77 (!) 151/89  Pulse: (!) 56 (!) 56  Resp: 20 20  Temp: (!) 36.4 C (!) 36.4 C  SpO2: 100% 100%    Last Pain:  Vitals:   02/09/24 0556  TempSrc:   PainSc: 5                  Louie Boston

## 2024-02-09 NOTE — Progress Notes (Signed)
 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  The patient reports nausea and vomiting since since Wednesday of this week has noticed that her umbilical hernia is unreducible with increasing abdominal pain when it is touched along with nausea and vomiting since then.  She denies any flatus or bowel movement since Wednesday.  The patient denies any chest pain fever chills or shortness of breath.   Case discussed with the emergency room physician who has already discussed this case with the surgeon on-call and plans to take the patient to the operating room from the ER and request admission under hospital medicine services primary.   The patient's daughter at the bedside [name] confirmed the history as provided by the patient.     Past medical records reviewed and summarized the patient's last oncology outpatient progress note 02/01/2024: cycle 12 of FOLFOX plus Avastin planned.  Assessment and Plan:  Small bowel obstruction and perforation due to strangulated ventral hernia Focal peritonitis due to perforation Continue IV Zosyn General Surgery consulted status post laparotomy, small bowel defect was closed, no bowel resection was performed. Continue NG tube with suction Continue abdominal drain Keep n.p.o. Continue IV fluid for hydration Monitor electrolytes Continue TPN   Hypokalemia, potassium repleted. Hypophosphatemia, Phos repleted. Monitor electrolytes and replete as needed.  AKI due to dehydration Monitor renal  functions Continue IV fluid for hydration  Elevated troponin most likely demand ischemia TTE LVEF 70 to 75%, no WMA, grade 1 diastolic dysfunction, no significant valvular abnormality.  NIDDM T2, HbA1c 6.5 well-controlled Held home medications for now Monitor CBG Continue NovoLog sliding scale  COPD, no exacerbation noticed on exam Continue albuterol nebs as needed  Hypertension, HLD Held home medications for now Monitor BP and manage accordingly Use IV hydralazine as needed  Hypothyroid, resume Synthroid when patient is able to take oral meds   Body mass index is 26.37 kg/m.  Nutrition Problem: Moderate Malnutrition Etiology: chronic illness (stage IV colon cancer) Interventions: Interventions: MVI, TPN   Diet: NPO on TPN DVT Prophylaxis: Subcutaneous Heparin    Advance goals of care discussion: Full code  Family Communication: family was not present at bedside, at the time of interview.  The pt provided permission to discuss medical plan with the family. Opportunity was given to ask question and all questions were answered satisfactorily.   Disposition:  Pt is from Home, admitted with SBO with perforation, s/p Exlap, still on Iv Abx, NPO and on TPN, which precludes a safe discharge. Discharge to Home, when stable, may need few days to improve.  Subjective: No significant overnight events, patient did work with physical therapy, she was sitting on the recliner.  Abdominal pain is well-controlled 5/10, still patient is not passing gas and no BM. Patient denied any other complaints.  Physical Exam: General: NAD, lying comfortably Appear in no distress, affect appropriate Eyes: PERRLA ENT: Oral Mucosa Clear, moist  Neck: no JVD,  Cardiovascular: S1 and S2 Present, no Murmur,  Respiratory: good respiratory effort, Bilateral Air entry equal and Decreased, no Crackles,  no wheezes Abdomen: s/p ex lap, dressing CDI, JP drain intact, postop tenderness, bowel sounds  sluggish.  NG tube intact Skin: no rashes Extremities: no Pedal edema, no calf tenderness Neurologic: without any new focal findings Gait not checked due to patient safety concerns  Vitals:   02/09/24 0345 02/09/24 0500 02/09/24 0817 02/09/24 1134  BP: (!) 151/89  136/82 (!) 157/74  Pulse: (!) 56  (!) 56 61  Resp: 20  16 16   Temp: (!) 97.5 F (36.4 C)  (!) 97.5 F (36.4 C) (!) 97.5 F (36.4 C)  TempSrc: Oral     SpO2: 100%  100% 100%  Weight:  74.1 kg    Height:        Intake/Output Summary (Last 24 hours) at 02/09/2024 1559 Last data filed at 02/09/2024 1135 Gross per 24 hour  Intake --  Output 3355 ml  Net -3355 ml   Filed Weights   02/07/24 1757 02/07/24 2045 02/09/24 0500  Weight: 81.2 kg 81.2 kg 74.1 kg    Data Reviewed: I have personally reviewed and interpreted daily labs, tele strips, imagings as discussed above. I reviewed all nursing notes, pharmacy notes, vitals, pertinent old records I have discussed plan of care as described above with RN and patient/family.  CBC: Recent Labs  Lab 02/07/24 1807 02/08/24 0211 02/09/24 0445  WBC 7.0 4.3 7.5  HGB 14.5 11.9* 9.9*  HCT 43.8 35.0* 29.9*  MCV 90.5 91.1 92.0  PLT 255 149* 127*   Basic Metabolic Panel: Recent Labs  Lab 02/07/24 1807 02/08/24 0211 02/09/24 0445  NA 134* 135 135  K 3.7 3.0* 3.0*  CL 91* 95* 101  CO2 21* 24 27  GLUCOSE 160* 136* 121*  BUN 70* 68* 66*  CREATININE 1.77* 1.48* 1.31*  CALCIUM 9.1 7.6* 7.8*  MG  --  1.9 2.1  PHOS  --  3.7 2.2*    Studies: DG ABD ACUTE 2+V W 1V CHEST Result Date: 02/09/2024 CLINICAL DATA:  Ileus. EXAM: DG ABDOMEN ACUTE WITH 1 VIEW CHEST COMPARISON:  X-ray 02/08/2024 abdomen.  CT 02/07/2024 FINDINGS: Stable right IJ chest port normal cardiopericardial silhouette. No consolidation, pneumothorax or effusion. No edema. Enteric tube extending beneath the diaphragm. Overlapping cardiac leads. Enteric tube along the stomach. Surgical chain along the low pelvis.  Few loops of mildly dilated loops of bowel in the mid abdomen. Few air-fluid levels on the upright view. These could be small bowel loops. Dystrophic calcification in the pelvis. IMPRESSION: Few dilated loops of bowel mid abdomen with some air-fluid levels. Again ileus is favored over obstruction with a history but recommend close follow-up. No obvious free air seen beneath the diaphragm. Enteric tube.  Pelvic surgical drain. Chest port Electronically Signed   By: Karen Kays M.D.   On: 02/09/2024 10:59    Scheduled Meds:  sodium chloride   Intravenous Once   Chlorhexidine Gluconate Cloth  6 each Topical Daily   heparin  5,000 Units Subcutaneous Q8H   insulin aspart  0-9 Units Subcutaneous Q4H   ketorolac  15 mg Intravenous Q6H   pantoprazole (PROTONIX) IV  40 mg Intravenous Daily   sodium chloride flush  10-40 mL Intracatheter Q12H   thiamine (VITAMIN B1) injection  100 mg Intravenous Daily   Continuous Infusions:  lactated ringers 125 mL/hr at 02/08/24 2320   piperacillin-tazobactam (ZOSYN)  IV 3.375 g (02/09/24 1442)   potassium chloride 10 mEq (02/09/24 1538)   potassium PHOSPHATE IVPB (in mmol) 30 mmol (02/09/24 1508)  TPN ADULT (ION) 28 mL/hr at 02/09/24 0843   TPN ADULT (ION)     PRN Meds: albuterol, hydrALAZINE, morphine injection, ondansetron (ZOFRAN) IV, sodium chloride flush  Time spent: 55 minutes  Author: Gillis Santa. MD [facility] 02/09/2024 3:59 PM  To reach On-call, see care teams to locate the attending and reach out to them via [email or web address]. If 7PM-7AM, please contact night-coverage If you still have difficulty reaching the attending provider, please page the [facility] (Director on Call) for [facility] on amion for assistance.

## 2024-02-10 DIAGNOSIS — K5652 Intestinal adhesions [bands] with complete obstruction: Secondary | ICD-10-CM | POA: Diagnosis not present

## 2024-02-10 LAB — BASIC METABOLIC PANEL WITH GFR
Anion gap: 9 (ref 5–15)
BUN: 46 mg/dL — ABNORMAL HIGH (ref 6–20)
CO2: 24 mmol/L (ref 22–32)
Calcium: 7.5 mg/dL — ABNORMAL LOW (ref 8.9–10.3)
Chloride: 101 mmol/L (ref 98–111)
Creatinine, Ser: 0.88 mg/dL (ref 0.44–1.00)
GFR, Estimated: >60 mL/min (ref >60)
Glucose, Bld: 125 mg/dL — ABNORMAL HIGH (ref 70–99)
Potassium: 3.3 mmol/L — ABNORMAL LOW (ref 3.5–5.1)
Sodium: 134 mmol/L — ABNORMAL LOW (ref 135–145)

## 2024-02-10 LAB — CBC
HCT: 28.3 % — ABNORMAL LOW (ref 36.0–46.0)
Hemoglobin: 9.5 g/dL — ABNORMAL LOW (ref 12.0–15.0)
MCH: 30.8 pg (ref 26.0–34.0)
MCHC: 33.6 g/dL (ref 30.0–36.0)
MCV: 91.9 fL (ref 80.0–100.0)
Platelets: 130 10*3/uL — ABNORMAL LOW (ref 150–400)
RBC: 3.08 MIL/uL — ABNORMAL LOW (ref 3.87–5.11)
RDW: 16.4 % — ABNORMAL HIGH (ref 11.5–15.5)
WBC: 5.9 10*3/uL (ref 4.0–10.5)
nRBC: 0 % (ref 0.0–0.2)

## 2024-02-10 LAB — GLUCOSE, CAPILLARY
Glucose-Capillary: 110 mg/dL — ABNORMAL HIGH (ref 70–99)
Glucose-Capillary: 131 mg/dL — ABNORMAL HIGH (ref 70–99)
Glucose-Capillary: 103 mg/dL — ABNORMAL HIGH (ref 70–99)
Glucose-Capillary: 102 mg/dL — ABNORMAL HIGH (ref 70–99)
Glucose-Capillary: 140 mg/dL — ABNORMAL HIGH (ref 70–99)
Glucose-Capillary: 159 mg/dL — ABNORMAL HIGH (ref 70–99)

## 2024-02-10 LAB — PHOSPHORUS: Phosphorus: 2.4 mg/dL — ABNORMAL LOW (ref 2.5–4.6)

## 2024-02-10 LAB — MAGNESIUM: Magnesium: 2.3 mg/dL (ref 1.7–2.4)

## 2024-02-10 MED ORDER — SODIUM PHOSPHATES 45 MMOLE/15ML IV SOLN
15.0000 mmol | Freq: Once | INTRAVENOUS | Status: AC
  Administered 2024-02-10: 15 mmol via INTRAVENOUS
  Filled 2024-02-10: qty 5

## 2024-02-10 MED ORDER — POTASSIUM PHOSPHATES 15 MMOLE/5ML IV SOLN: 30.0000 mmol | Freq: Once | INTRAVENOUS | Status: DC

## 2024-02-10 MED ORDER — POTASSIUM CHLORIDE 10 MEQ/100ML IV SOLN
10.0000 meq | INTRAVENOUS | Status: DC
  Filled 2024-02-10 (×2): qty 100

## 2024-02-10 MED ORDER — POTASSIUM CHLORIDE 10 MEQ/100ML IV SOLN: 10.0000 meq | INTRAVENOUS | Status: DC

## 2024-02-10 MED ORDER — POTASSIUM CHLORIDE 10 MEQ/100ML IV SOLN
10.0000 meq | INTRAVENOUS | Status: AC
Start: 2024-02-10 — End: 2024-02-10
  Administered 2024-02-10 (×2): 10 meq via INTRAVENOUS
  Filled 2024-02-10 (×2): qty 100

## 2024-02-10 MED ORDER — TRAVASOL 10 % IV SOLN
INTRAVENOUS | Status: AC
  Filled 2024-02-10: qty 1017.6

## 2024-02-10 NOTE — Progress Notes (Signed)
 Nutrition Follow-up  DOCUMENTATION CODES:   Non-severe (moderate) malnutrition in context of chronic illness  INTERVENTION:   -Obtain daily weights -TPN management per pharmacy -Recommend MVI in TPN -Recommend 100 mg thiamine daily x 7 days -Monitor Mg, K, and Phos and replete as needed secondary to high refeeding risk -RD will follow for ability to advance to PO diet/ enteral nutrition  NUTRITION DIAGNOSIS:   Moderate Malnutrition related to chronic illness (stage IV colon cancer) as evidenced by mild fat depletion, moderate fat depletion, mild muscle depletion, moderate muscle depletion.  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  The patient reports nausea and vomiting since 02/03/24 and has noticed that her umbilical hernia is unreducible with increasing abdominal pain when it is touched along with nausea and vomiting since then.  She denies any flatus or bowel movement since 02/03/24.  3/31- s/p Laparotomy; Small bowel resection; Reduction of ventral hernia with Repair strangulated ventral hernia measuring 10.5 cms, TPN initiated   Reviewed I/O's: -310 ml x 24 hours and -846 ml since admission  UOP: 800 ml x 24 hours  Drain output: 280 ml x 24 hours  Per KUB today, NGT tip and side-port in the stomach. Tube currently connected to low, intermittent suction.   Per general surgery notes, no flatus yet. Plan to continue with NGT until bowel function returns, then can start clamping trial.   Per pharmacy note, pt currently receiving TPN at 40 ml/hr, which provides 981 kcals  and 51 grams protein, meeting 49% of estimated kcal needs and 38% of estimated protein needs. TPN increasing slowly secondary to refeeding. PLan to increase to 80 ml/hr today, which provides 1962 kcals and 102 grams protein, meeting 96% of estimated kcal needs and 97% of estimated protein needs.   Noted discrepancies in weight. RD will continue to monitor wt trends.   Medications reviewed and include protonix, thiamine, and lactated ringers infusion @ 125 ml/hr.   Labs reviewed: Na: 134, K: 3.3 (on IV supplementation), Phos: 2.4 (on IV supplementation), Mg WDL, CBGS: 87-143 (inpatient orders for glycemic control are 0-9 units insulinn aspart every 4 hours).    Diet Order:   Diet Order             Diet NPO time specified  Diet effective now                   EDUCATION NEEDS:   Education needs have been addressed  Skin:  Skin Assessment: Skin Integrity Issues: Skin Integrity Issues:: Incisions Incisions: closed abdomen  Last BM:  Unknown  Height:   Ht Readings from Last 1 Encounters:  02/07/24 5\' 6"  (1.676 m)    Weight:   Wt Readings from Last 1 Encounters:  02/10/24 75.6 kg    Ideal Body Weight:  59.1 kg  BMI:  Body mass index is 26.9 kg/m.  Estimated Nutritional Needs:   Kcal:  2050-2250  Protein:  105-120 grams  Fluid:  2-2.2 L    Levada Schilling, RD, LDN, CDCES Registered Dietitian III Certified Diabetes Care and Education Specialist If unable to  reach this RD, please use "RD Inpatient" group chat on secure chat between hours of 8am-4 pm daily

## 2024-02-10 NOTE — Consult Note (Signed)
 PHARMACY - TOTAL PARENTERAL NUTRITION CONSULT NOTE   Indication: Prolonged ileus  Patient Measurements: Height: 5\' 6"  (167.6 cm) Weight: 75.6 kg (166 lb 10.7 oz) IBW/kg (Calculated) : 59.3 TPN AdjBW (KG): 64.8 Body mass index is 26.9 kg/m. Usual Weight: 77.3 kg   Assessment:  63 y.o. female s/p exploratory laparotomy, small bowel resection, repair of ventral hernia (10.5 cm) for incarcerated ventral hernia with small bowel obstruction, perforation, and feculent peritonitis, complicated by pertinent comorbidities including IV colon cancer with metastasis to the lung, uterus and liver. She meets criteria for malnutrition and last PO intake was 3/24.   Glucose / Insulin: BG < 150, 1 unit aspart given in 24 hours Electrolytes:  Na 134 ,K+ 3.3 Mg 2.3 , Phos 2.4  Anion gap 22 > 16>9 Renal: Aki resolved with Scr = 0.88 today 4/2 Hepatic: LFTs wnl.  Intake / Output; MIVF: completed GI Imaging: Abdomen and pelvis CT:  1. Interval development of small-bowel obstruction with point of transition related to moderate complex ventral hernia which contains mesenteric fat, fluid and slightly thickened small bowel loop. Small amount of free gas within the hernia sac, concern for incarcerated hernia with possible bowel perforation. Small volume free fluid within the hernia. 2. Interval decrease in size of previously measured hepatic metastatic lesions. However interim finding of multiple subcentimeter hypodense liver lesions/suspected metastatic lesions not clearly seen on the prior exam. 3. Slight decreased size of cecal mass. Slightly decreased size of posterior pelvic mass that is probably associated with the right ovary/adnexa. 4. Slight decreased size of index left lower lobe pulmonary nodule, however there appear to be several new small pulmonary nodules at the lung bases concerning for metastatic disease. 5. Aortic atherosclerosis. GI Surgeries / Procedures: s/p exploratory laparotomy, small  bowel resection, repair of ventral hernia  Central access: plan for 3/31 TPN start date: plan for 3/31   Nutritional Goals: Goal TPN rate is 83 mL/hr (provides 106.85 g of protein and 2059 kcals per day)  RD Assessment: Estimated Needs Total Energy Estimated Needs: 2050-2250 Total Protein Estimated Needs: 105-120 grams Total Fluid Estimated Needs: 2-2.2 L  Current Nutrition:  NPO with NGT decompression TPN  Patient would not meet kcal goal of at least 75% with the Clinimix, so compounded TPN started.  Plan:  Patient tolerating current TPN, BG < 150, anion gap =9, electrolytes normalizing. Phos > 2. Will increased TPN rate to 80 mL/hr at 1800.  Protein 101.7 g,  Dextrose 288g , Lipids 57.6 g (Total kcal 1962.6) Electrolytes in TPN: Na 55mEq/L, K 26mEq/L, Ca 35mEq/L, Mg 43mEq/L, and Phos 80mmol/L. Cl:Ac 1:1 Continue thiamine 100 mg IV x 7 days outpatient of TPN bag (last dose 4/6). Holding off on chromium.  K and Phos replacement outside TPN bag today 4/2 Add standard MVI and trace elements to TPN Initiate Sensitive q4h SSI and adjust as needed  Monitor TPN labs on Mon/Thurs.   Maddock Finigan Rodriguez-Guzman PharmD, BCPS 02/10/2024 9:37 AM

## 2024-02-10 NOTE — Progress Notes (Signed)
 [facility] SURGICAL PROGRESS NOTE  Hospital Day(s): 3.   Post op day(s): 3 Days Post-Op.   Interval History:  Patient seen and examined same day as surgery No acute events or new complaints overnight.  Patient reports she is doing better; sore expectedly Anxious to get NGT out No fever, chills, nausea WBC normal at 5.9K Hgb to 9.5; stable Renal function normalized; sCr - 0.88; UO - 800 ccs Hypokalemia to 3.3 Hypophosphatemia to 2.4 NGT in place; output was not recorded; she currently has about 500 ccs in canister, reports canister changed 2-3x yesterday  Surgical drain; 280 ccs - serous She is on Zosyn NPO No flatus but "feels rumbling"  Vital signs in last 24 hours: [min-max] current  Temp:  [97.2 F (36.2 C)-98.2 F (36.8 C)] 97.2 F (36.2 C) (04/02 0424) Pulse Rate:  [56-63] 57 (04/02 0424) Resp:  [16-20] 18 (04/02 0424) BP: (129-157)/(74-82) 144/79 (04/02 0424) SpO2:  [98 %-100 %] 98 % (04/02 0424)     Height: 5\' 6"  (167.6 cm) Weight: 74.1 kg BMI (Calculated): 26.38   Intake/Output last 2 shifts:  04/01 0701 - 04/02 0700 In: 770.3 [I.V.:609.5; IV Piggyback:160.9] Out: 1080 [Urine:800; Drains:280]   Physical Exam:  Constitutional: alert, cooperative and no distress  HEENT: NGT in place; output thinner this AM, seems to be slowing  Respiratory: breathing non-labored at rest  Cardiovascular: regular rate and sinus rhythm  Gastrointestinal: soft, non-tender, and non-distended, no rebound/guarding. Drain in left abdomen; serous  Integumentary: Laparotomy healing via secondary intention, no drainage, no erythema   Labs:     Latest Ref Rng & Units 02/10/2024    1:45 AM 02/09/2024    4:45 AM 02/08/2024    2:11 AM  CBC  WBC 4.0 - 10.5 K/uL 5.9  7.5  4.3   Hemoglobin 12.0 - 15.0 g/dL 9.5  9.9  16.1   Hematocrit 36.0 - 46.0 % 28.3  29.9  35.0   Platelets 150 - 400 K/uL 130  127  149       Latest Ref Rng & Units 02/10/2024    1:45 AM 02/09/2024    6:13  PM 02/09/2024    4:45 AM  CMP  Glucose 70 - 99 mg/dL 096  045  409   BUN 6 - 20 mg/dL 46  53  66   Creatinine 0.44 - 1.00 mg/dL 8.11  9.14  7.82   Sodium 135 - 145 mmol/L 134  134  135   Potassium 3.5 - 5.1 mmol/L 3.3  3.7  3.0   Chloride 98 - 111 mmol/L 101  102  101   CO2 22 - 32 mmol/L 24  25  27    Calcium 8.9 - 10.3 mg/dL 7.5  7.7  7.8     Imaging studies: No new pertinent imaging studies   Assessment/Plan:  63 y.o. female with post-operative ileus 3 Days Post-Op s/p exploratory laparotomy, small bowel resection, repair of ventral hernia (10.5 cm) for incarcerated ventral hernia with small bowel obstruction, perforation, and feculent peritonitis, complicated by pertinent comorbidities including IV colon cancer with metastasis to the lung, uterus and liver    - Continue TPN; advance to goal; monitor electrolytes   - Continue NGT decompression; LIS; monitor and record output - once bowel function returns, we can proceed with clamping trial  - Continue surgical drain; monitor and record output  - Continue IV Abx (Zosyn) - likely needs 14 days total given feculent peritonitis intra-operatively  - Wound Care: Daily and  PRN dressing changes with saline moistened Kerlix gauze, cover, secure.   - Monitor abdominal examination; on-going bowel function  - Pain control prn; antiemetics prn - Mobilize; therapies on board  - Further management per primary service; we will follow    All of the above findings and recommendations were discussed with the patient, and the medical team, and all of patient's questions were answered to her expressed satisfaction.  -- Lynden Oxford, PA-C [facility] 02/10/2024, 7:28 AM M-F: 7am - 4pm

## 2024-02-10 NOTE — Plan of Care (Signed)

## 2024-02-10 NOTE — Progress Notes (Signed)
 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  The patient reports nausea and vomiting since since Wednesday of this week has noticed that her umbilical hernia is unreducible with increasing abdominal pain when it is touched along with nausea and vomiting since then.  She denies any flatus or bowel movement since Wednesday.  The patient denies any chest pain fever chills or shortness of breath. S/P laporatomy 02/08/24.    PT Comments  Pt received in Semi-Fowler's position and agreeable to therapy.  Pt needed nursing to stop NG tube before continuing.  Pt able to perform bed mobility and transfers without any difficulty, just needing extra time to keep lines/leads untangled.  Pt then ambulated the first lap around the nursing station with light use of the IV pole and on the second lap, not utilizing any UE support.  Pt would occasionally touch the railing, but never holding on or touching for a prolonged period of time.  Pt then returned to the room and was assisted to the bathroom and was able to have a bowel movement.  Nursing notified and therapist assisted the pt to the bed and was left with all needs met and the nurse in the room.     If plan is discharge home, recommend the following: Assistance with cooking/housework;Assist for transportation;Help with stairs or ramp for entrance   Can travel by private vehicle        Equipment Recommendations  None recommended by PT    Recommendations for Other Services       Precautions / Restrictions Precautions Precautions: Fall Recall of Precautions/Restrictions:  Intact Restrictions Weight Bearing Restrictions Per Provider Order: No     Mobility  Bed Mobility Overal bed mobility: Modified Independent                  Transfers Overall transfer level: Needs assistance   Transfers: Sit to/from Stand Sit to Stand: Supervision           General transfer comment: SUE support on IV pole and bed to stand    Ambulation/Gait Ambulation/Gait assistance: Supervision Gait Distance (Feet): 320 Feet Assistive device: IV Pole, None Gait Pattern/deviations: Step-through pattern Gait velocity: decreased     General Gait Details: Pt with minimal UUE support on IV pole and then progressing on second lap to utilizing no UE support.   Stairs             Wheelchair Mobility     Tilt Bed    Modified Rankin (Stroke Patients Only)       Balance Overall balance assessment: Mild deficits observed, not formally tested                                          Communication Communication Communication: No apparent difficulties  Cognition Arousal: Alert Behavior During Therapy: WFL for tasks assessed/performed                           PT - Cognition Comments: pleasant and cooperative Following commands: Intact  Cueing Cueing Techniques: Verbal cues  Exercises      General Comments        Pertinent Vitals/Pain Pain Assessment Pain Assessment: No/denies pain    Home Living                          Prior Function            PT Goals (current goals can now be found in the care plan section) Acute Rehab PT Goals Patient Stated Goal: to return home PT Goal Formulation: With patient Time For Goal Achievement: 02/23/24 Potential to Achieve Goals: Good Progress towards PT goals: Progressing toward goals    Frequency    Min 3X/week      PT Plan      Co-evaluation              AM-PAC PT "6 Clicks" Mobility   Outcome Measure  Help needed turning from  your back to your side while in a flat bed without using bedrails?: None Help needed moving from lying on your back to sitting on the side of a flat bed without using bedrails?: None Help needed moving to and from a bed to a chair (including a wheelchair)?: A Little Help needed standing up from a chair using your arms (e.g., wheelchair or bedside chair)?: A Little Help needed to walk in hospital room?: A Little Help needed climbing 3-5 steps with a railing? : A Little 6 Click Score: 20    End of Session Equipment Utilized During Treatment: Gait belt Activity Tolerance: Patient tolerated treatment well Patient left: in chair;with call bell/phone within reach;with chair alarm set Nurse Communication: Mobility status PT Visit Diagnosis: Other abnormalities of gait and mobility (R26.89);Muscle weakness (generalized) (M62.81);Pain     Time: 9518-8416 PT Time Calculation (min) (ACUTE ONLY): 29 min  Charges:    $Therapeutic Activity: 23-37 mins PT General Charges $$ ACUTE PT VISIT: 1 Visit                     Nolon Bussing, PT, DPT Physical Therapist - [facility]  02/10/24, 5:54 PM

## 2024-02-10 NOTE — Progress Notes (Signed)
 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  The patient reports nausea and vomiting since since Wednesday of this week has noticed that her umbilical hernia is unreducible with increasing abdominal pain when it is touched along with nausea and vomiting since then.  She denies any flatus or bowel movement since Wednesday.  The patient denies any chest pain fever chills or shortness of breath.   Case discussed with the emergency room physician who has already discussed this case with the surgeon on-call and plans to take the patient to the operating room from the ER and request admission under hospital medicine services primary.   The patient's daughter at the bedside [name] confirmed the history as provided by the patient.     Past medical records reviewed and summarized the patient's last oncology outpatient progress note 02/01/2024: cycle 12 of FOLFOX plus Avastin planned.  Assessment and Plan:  Small bowel obstruction and perforation due to strangulated ventral hernia Focal peritonitis due to perforation Biopsy of small bowel resection shows adenocarcinoma. Continue IV Zosyn General Surgery consulted status post laparotomy, small bowel defect was closed, no bowel resection was performed. Continue NG tube with suction Continue abdominal drain Keep n.p.o. Continue IV fluid for hydration Monitor electrolytes Continue TPN   Hypokalemia, potassium repleted. Hypophosphatemia, Phos repleted. Monitor electrolytes and replete  as needed. Pharmacy consulted to manage electrolytes and TPN  AKI due to dehydration.  Resolved Cr 1.48--0.88 renal functions improved Monitor renal functions Continue IV fluid for hydration  Elevated troponin most likely demand ischemia TTE LVEF 70 to 75%, no WMA, grade 1 diastolic dysfunction, no significant valvular abnormality.  NIDDM T2, HbA1c 6.5 well-controlled Held home medications for now Monitor CBG Continue NovoLog sliding scale  COPD, no exacerbation noticed on exam Continue albuterol nebs as needed  Hypertension, HLD Held home medications for now Monitor BP and manage accordingly Use IV hydralazine as needed  Hypothyroid, resume Synthroid when patient is able to take oral meds   Body mass index is 26.9 kg/m.  Nutrition Problem: Moderate Malnutrition Etiology: chronic illness (stage IV colon cancer) Interventions: Interventions: MVI, TPN   Diet: NPO on TPN DVT Prophylaxis: Subcutaneous Heparin    Advance goals of care discussion: Full code  Family Communication: family was not present at bedside, at the time of interview.  The pt provided permission to discuss medical plan with the family. Opportunity was given to ask question and all questions were answered satisfactorily.   Disposition:  Pt is from Home, admitted with SBO with perforation, s/p Exlap, still on Iv Abx, NPO and on TPN, which precludes a safe discharge. Discharge to Home, when stable, may need few days to improve.  Subjective: No significant events overnight.  Abdominal pain is improving, it is off and on 4-5/10, still has not passed any gas and no BM.  Denied any complaints  Physical Exam: General: NAD, lying comfortably Appear in no distress, affect appropriate Eyes: PERRLA ENT: Oral Mucosa Clear, moist  Neck: no JVD,  Cardiovascular: S1 and S2 Present, no Murmur,  Respiratory: good respiratory effort, Bilateral Air entry equal and Decreased, no Crackles, no wheezes Abdomen: s/p  ex lap, dressing CDI, JP drain intact, postop tenderness, bowel sounds sluggish.  NG tube intact Skin: no rashes Extremities: no Pedal edema, no calf tenderness Neurologic: without any new focal findings Gait not checked due to patient safety concerns  Vitals:   02/10/24 0424 02/10/24 0500 02/10/24 0852 02/10/24 1134  BP: (!) 144/79  (!) 152/78 (!) 153/85  Pulse: (!) 57   (!) 59  Resp: 18  18 16   Temp: (!) 97.2 F (36.2 C)   98.1 F (36.7 C)  TempSrc:      SpO2: 98%  100% 100%  Weight:  75.6 kg    Height:        Intake/Output Summary (Last 24 hours) at 02/10/2024 1436 Last data filed at 02/10/2024 4782 Gross per 24 hour  Intake 770.34 ml  Output 205 ml  Net 565.34 ml   Filed Weights   02/07/24 2045 02/09/24 0500 02/10/24 0500  Weight: 81.2 kg 74.1 kg 75.6 kg    Data Reviewed: I have personally reviewed and interpreted daily labs, tele strips, imagings as discussed above. I reviewed all nursing notes, pharmacy notes, vitals, pertinent old records I have discussed plan of care as described above with RN and patient/family.  CBC: Recent Labs  Lab 02/07/24 1807 02/08/24 0211 02/09/24 0445 02/10/24 0145  WBC 7.0 4.3 7.5 5.9  HGB 14.5 11.9* 9.9* 9.5*  HCT 43.8 35.0* 29.9* 28.3*  MCV 90.5 91.1 92.0 91.9  PLT 255 149* 127* 130*   Basic Metabolic Panel: Recent Labs  Lab 02/07/24 1807 02/08/24 0211 02/09/24 0445 02/09/24 1813 02/10/24 0145  NA 134* 135 135 134* 134*  K 3.7 3.0* 3.0* 3.7 3.3*  CL 91* 95* 101 102 101  CO2 21* 24 27 25 24   GLUCOSE 160* 136* 121* 156* 125*  BUN 70* 68* 66* 53* 46*  CREATININE 1.77* 1.48* 1.31* 0.97 0.88  CALCIUM 9.1 7.6* 7.8* 7.7* 7.5*  MG  --  1.9 2.1  --  2.3  PHOS  --  3.7 2.2*  --  2.4*    Studies: No results found.   Scheduled Meds:  sodium chloride   Intravenous Once   Chlorhexidine Gluconate Cloth  6 each Topical Daily   heparin  5,000 Units Subcutaneous Q8H   insulin aspart  0-9 Units Subcutaneous Q4H   ketorolac   15 mg Intravenous Q6H   pantoprazole (PROTONIX) IV  40 mg Intravenous Daily   sodium chloride flush  10-40 mL Intracatheter Q12H   thiamine (VITAMIN B1) injection  100 mg Intravenous Daily   Continuous Infusions:  lactated ringers 125 mL/hr at 02/10/24 0632   piperacillin-tazobactam (ZOSYN)  IV 3.375 g (02/10/24 0615)   potassium chloride 10 mEq (02/10/24 1357)   sodium PHOSPHATE IVPB (in mmol) 15 mmol (02/10/24 1356)   TPN ADULT (ION) 40 mL/hr at 02/09/24 1821   TPN ADULT (ION)     PRN Meds: albuterol, hydrALAZINE, morphine injection, ondansetron (ZOFRAN) IV, sodium chloride flush  Time spent: 55 minutes  Author: Gillis Santa. MD [facility] 02/10/2024 2:36 PM  To reach On-call, see care teams to locate the attending and reach out to them via [email or web address]. If 7PM-7AM, please contact night-coverage If you still have difficulty reaching the attending provider, please page the [facility] (Director on Call) for [facility] on amion for assistance.

## 2024-02-11 ENCOUNTER — Inpatient Hospital Stay

## 2024-02-11 DIAGNOSIS — K5652 Intestinal adhesions [bands] with complete obstruction: Secondary | ICD-10-CM | POA: Diagnosis not present

## 2024-02-11 LAB — CBC
HCT: 31.9 % — ABNORMAL LOW (ref 36.0–46.0)
Hemoglobin: 10.5 g/dL — ABNORMAL LOW (ref 12.0–15.0)
MCH: 30.7 pg (ref 26.0–34.0)
MCHC: 32.9 g/dL (ref 30.0–36.0)
MCV: 93.3 fL (ref 80.0–100.0)
Platelets: 159 10*3/uL (ref 150–400)
RBC: 3.42 MIL/uL — ABNORMAL LOW (ref 3.87–5.11)
RDW: 16.5 % — ABNORMAL HIGH (ref 11.5–15.5)
WBC: 5.5 10*3/uL (ref 4.0–10.5)
nRBC: 0.4 % — ABNORMAL HIGH (ref 0.0–0.2)

## 2024-02-11 LAB — COMPREHENSIVE METABOLIC PANEL WITH GFR
ALT: 12 U/L (ref 0–44)
AST: 28 U/L (ref 15–41)
Albumin: 2.6 g/dL — ABNORMAL LOW (ref 3.5–5.0)
Alkaline Phosphatase: 50 U/L (ref 38–126)
Anion gap: 9 (ref 5–15)
BUN: 21 mg/dL — ABNORMAL HIGH (ref 6–20)
CO2: 25 mmol/L (ref 22–32)
Calcium: 8.2 mg/dL — ABNORMAL LOW (ref 8.9–10.3)
Chloride: 106 mmol/L (ref 98–111)
Creatinine, Ser: 0.73 mg/dL (ref 0.44–1.00)
GFR, Estimated: >60 mL/min (ref >60)
Glucose, Bld: 158 mg/dL — ABNORMAL HIGH (ref 70–99)
Potassium: 4.1 mmol/L (ref 3.5–5.1)
Sodium: 140 mmol/L (ref 135–145)
Total Bilirubin: 0.5 mg/dL (ref 0.0–1.2)
Total Protein: 6.7 g/dL (ref 6.5–8.1)

## 2024-02-11 LAB — MAGNESIUM: Magnesium: 1.8 mg/dL (ref 1.7–2.4)

## 2024-02-11 LAB — GLUCOSE, CAPILLARY
Glucose-Capillary: 150 mg/dL — ABNORMAL HIGH (ref 70–99)
Glucose-Capillary: 115 mg/dL — ABNORMAL HIGH (ref 70–99)
Glucose-Capillary: 155 mg/dL — ABNORMAL HIGH (ref 70–99)
Glucose-Capillary: 122 mg/dL — ABNORMAL HIGH (ref 70–99)
Glucose-Capillary: 134 mg/dL — ABNORMAL HIGH (ref 70–99)
Glucose-Capillary: 159 mg/dL — ABNORMAL HIGH (ref 70–99)

## 2024-02-11 LAB — PHOSPHORUS: Phosphorus: 2.7 mg/dL (ref 2.5–4.6)

## 2024-02-11 MED ORDER — HYDRALAZINE HCL 20 MG/ML IJ SOLN
10.0000 mg | Freq: Four times a day (QID) | INTRAMUSCULAR | Status: DC | PRN
  Administered 2024-02-11: 10 mg via INTRAVENOUS
  Filled 2024-02-11: qty 1

## 2024-02-11 MED ORDER — HYDRALAZINE HCL 50 MG PO TABS
50.0000 mg | ORAL_TABLET | Freq: Three times a day (TID) | ORAL | Status: DC
Start: 2024-02-11 — End: 2024-02-14
  Administered 2024-02-11 – 2024-02-14 (×8): 50 mg via ORAL
  Filled 2024-02-11 (×8): qty 1

## 2024-02-11 MED ORDER — TRAVASOL 10 % IV SOLN
INTRAVENOUS | Status: AC
  Filled 2024-02-11: qty 1017.6

## 2024-02-11 MED ORDER — LEVOTHYROXINE SODIUM 100 MCG PO TABS
100.0000 ug | ORAL_TABLET | Freq: Every day | ORAL | Status: DC
  Administered 2024-02-11 – 2024-02-14 (×4): 100 ug via ORAL
  Filled 2024-02-11 (×4): qty 1

## 2024-02-11 MED ORDER — HYDRALAZINE HCL 25 MG PO TABS
25.0000 mg | ORAL_TABLET | Freq: Three times a day (TID) | ORAL | Status: DC
Start: 2024-02-11 — End: 2024-02-11
  Administered 2024-02-11: 25 mg via ORAL
  Filled 2024-02-11: qty 1

## 2024-02-11 NOTE — Progress Notes (Signed)
 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  The patient reports nausea and vomiting since since Wednesday of this week has noticed that her umbilical hernia is unreducible with increasing abdominal pain when it is touched along with nausea and vomiting since then.  She denies any flatus or bowel movement since Wednesday.  The patient denies any chest pain fever chills or shortness of breath. S/P laporatomy 02/08/24.    PT Comments  Pt received in bed agreeable ot PT interventions. Pt alert, motivated and pleasant. G Tub D?C and Pt on TPN. Denies pain. Pt independent with bed mobility, and STS transfers, Ambulated in the hallways with sup x 1 and became comfortable in chair. Pt is showing steady progress but shows generalized weakness. PT introduced AROM to BLE and provided them in written on White board 10 reps every 3 hours. PT will continue in acute and current POC remains appropriate. Pt agrees.     If plan is discharge home, recommend the following: Assistance with cooking/housework;Assist for transportation;Help with stairs or ramp for entrance   Can travel by private vehicle        Equipment Recommendations  None recommended by PT    Recommendations for Other Services       Precautions / Restrictions Precautions Precautions: Fall Recall of Precautions/Restrictions: Intact Restrictions Weight Bearing Restrictions Per Provider Order: No     Mobility  Bed Mobility Overal bed mobility: Modified Independent                  Transfers Overall transfer level: Needs assistance Equipment used:  None Transfers: Sit to/from Stand Sit to Stand: Supervision                Ambulation/Gait Ambulation/Gait assistance: Supervision Gait Distance (Feet): 200 Feet Assistive device: IV Pole, None Gait Pattern/deviations: Step-through pattern Gait velocity: decreased     General Gait Details: Pt with minimal UUE support on IV pole and then progressing on second lap to utilizing no UE support.   Stairs             Wheelchair Mobility     Tilt Bed    Modified Rankin (Stroke Patients Only)       Balance Overall balance assessment: Mild deficits observed, not formally tested                                          Communication Communication Communication: No apparent difficulties  Cognition Arousal: Alert Behavior During Therapy: WFL for tasks assessed/performed   PT - Cognitive impairments: No apparent impairments                       PT - Cognition Comments: pleasant and cooperative Following commands: Intact      Cueing Cueing Techniques: Verbal cues  Exercises General Exercises - Lower Extremity Ankle Circles/Pumps: AROM, Both, 10 reps, Seated Heel Slides: AROM, Both, 10 reps, Seated Straight Leg Raises: AROM, Both, 10 reps, Seated Other Exercises Other Exercises: same for in room exs with written on white board fro every  3 hrs 10 reps each    General Comments        Pertinent Vitals/Pain Pain Assessment Pain Assessment: No/denies pain    Home Living                          Prior Function            PT Goals (current goals can now be found in the care plan section) Acute Rehab PT Goals Patient Stated Goal: to return home PT Goal Formulation: With patient Time For Goal Achievement: 02/23/24 Potential to Achieve Goals: Good Progress towards PT goals: Progressing toward goals    Frequency    Min 3X/week      PT Plan      Co-evaluation              AM-PAC PT "6 Clicks"  Mobility   Outcome Measure  Help needed turning from your back to your side while in a flat bed without using bedrails?: None Help needed moving from lying on your back to sitting on the side of a flat bed without using bedrails?: None Help needed moving to and from a bed to a chair (including a wheelchair)?: A Little Help needed standing up from a chair using your arms (e.g., wheelchair or bedside chair)?: A Little Help needed to walk in hospital room?: A Little Help needed climbing 3-5 steps with a railing? : A Lot 6 Click Score: 19    End of Session Equipment Utilized During Treatment: Gait belt Activity Tolerance: Patient tolerated treatment well Patient left: in chair;with call bell/phone within reach;with chair alarm set Nurse Communication: Mobility status PT Visit Diagnosis: Other abnormalities of gait and mobility (R26.89);Muscle weakness (generalized) (M62.81);Pain     Time: 1022-1050 PT Time Calculation (min) (ACUTE ONLY): 28 min  Charges:    $Gait Training: 8-22 mins $Therapeutic Exercise: 8-22 mins PT General Charges $$ ACUTE PT VISIT: 1 Visit                     Janet Berlin PT DPT 11:09 AM,02/11/24

## 2024-02-11 NOTE — Progress Notes (Addendum)
 [facility] SUR[facility]ICAL PROGRESS NOTE  Hospital Day(s): 4.   Post op day(s): 4 Days Post-Op.   Interval History:  Patient seen and examined same day as surgery No acute events or new complaints overnight.  Patient reports she is doing well Had multiple BM in last 24 hours No fever, chills, nausea WBC normal at 5.5K Hgb to 10.5; stable Renal function normalized; sCr - 0.73; UO - 600 ccs + unmeasured No electrolyte derangement this AM NGT in place; output recorded at 600 ccs Surgical drain; 210 ccs - serous She is on Zosyn NPO  Vital signs in last 24 hours: [min-max] current  Temp:  [98.1 F (36.7 C)-98.9 F (37.2 C)] 98.5 F (36.9 C) (04/03 0445) Pulse Rate:  [53-62] 56 (04/03 0445) Resp:  [16-18] 18 (04/03 0051) BP: (151-167)/(78-96) 167/85 (04/03 0445) SpO2:  [95 %-100 %] 95 % (04/03 0445) Weight:  [83 kg] 83 kg (04/03 0500)     Height: 5\' 6"  (167.6 cm) Weight: 83 kg BMI (Calculated): 29.55   Intake/Output last 2 shifts:  04/02 0701 - 04/03 0700 In: 940.8 [I.V.:788.1; IV Piggyback:152.7] Out: 1410 [Urine:600; Emesis/NG output:600; Drains:210]   Physical Exam:  Constitutional: alert, cooperative and no distress  HEENT: NGT in place (removed) Respiratory: breathing non-labored at rest  Cardiovascular: regular rate and sinus rhythm  Gastrointestinal: soft, non-tender, and non-distended, no rebound/guarding. Drain in left abdomen; serous  Integumentary: Laparotomy healing via secondary intention, no drainage, no erythema   Labs:     Latest Ref Rng & Units 02/11/2024    5:15 AM 02/10/2024    1:45 AM 02/09/2024    4:45 AM  CBC  WBC 4.0 - 10.5 K/uL 5.5  5.9  7.5   Hemoglobin 12.0 - 15.0 g/dL 57.8  9.5  9.9   Hematocrit 36.0 - 46.0 % 31.9  28.3  29.9   Platelets 150 - 400 K/uL 159  130  127       Latest Ref Rng & Units 02/11/2024    5:15 AM 02/10/2024    1:45 AM 02/09/2024    6:13 PM  CMP  Glucose 70 - 99 mg/dL 469  629  528   BUN 6 - 20 mg/dL 21  46  53    Creatinine 0.44 - 1.00 mg/dL 4.13  2.44  0.10   Sodium 135 - 145 mmol/L 140  134  134   Potassium 3.5 - 5.1 mmol/L 4.1  3.3  3.7   Chloride 98 - 111 mmol/L 106  101  102   CO2 22 - 32 mmol/L 25  24  25    Calcium 8.9 - 10.3 mg/dL 8.2  7.5  7.7   Total Protein 6.5 - 8.1 g/dL 6.7     Total Bilirubin 0.0 - 1.2 mg/dL 0.5     Alkaline Phos 38 - 126 U/L 50     AST 15 - 41 U/L 28     ALT 0 - 44 U/L 12       Imaging studies:   KUB (02/11/2024) personally reviewed with significant improvement in small bowel pattern, gas throughout colon, and radiologist report pending   Assessment/Plan:  63 y.o. female with resolving post-operative ileus 4 Days Post-Op s/p exploratory laparotomy, small bowel resection, repair of ventral hernia (10.5 cm) for incarcerated ventral hernia with small bowel obstruction, perforation, and feculent peritonitis, complicated by pertinent comorbidities including IV colon cancer with metastasis to the lung, uterus and liver    - NGT discontinued this AM  -  Will start on CLD; anticipate slow diet advancement over next 24 hours   - Continue TPN; advance to goal; monitor electrolytes - anticipate weaning once on FLD and tolerating this   - Continue surgical drain; monitor and record output  - Continue IV Abx (Zosyn) - likely needs 14 days total given feculent peritonitis intra-operatively  - Wound Care: Daily and PRN dressing changes with saline moistened Kerlix gauze, cover, secure.  - Monitor abdominal examination; on-going bowel function  - Pain control prn; antiemetics prn - Mobilize; therapies on board  - Further management per primary service; we will follow     - Discharge Planning: Doing well, now with bowel function, diet initiated. Will need likely 48 hours for diet advancement and to wean TPN. Potentially home over the weekend.   All of the above findings and recommendations were discussed with the patient, and the medical team, and all of patient's questions  were answered to her expressed satisfaction.  -- Lynden Oxford, PA-C [facility] 02/11/2024, 7:14 AM M-F: 7am - 4pm

## 2024-02-11 NOTE — Progress Notes (Signed)
 OT Cancellation Note  Patient Details Name: Alexis Garcia MRN: [id number] DOB: 06/19/1961   Cancelled Treatment:    Reason Eval/Treat Not Completed: Other (comment) Pt declined OT, reporting she had just gotten back to bed and had worked with PT earlier.  Pt wanting to rest.  Will attempt later as time allows.    Danelle Earthly, MS, OTR/L   Alexis Garcia 02/11/2024, 1:31 PM

## 2024-02-11 NOTE — Consult Note (Signed)
 PHARMACY - TOTAL PARENTERAL NUTRITION CONSULT NOTE   Indication: Prolonged ileus  Patient Measurements: Height: 5\' 6"  (167.6 cm) Weight: 83 kg (182 lb 15.7 oz) IBW/kg (Calculated) : 59.3 TPN AdjBW (KG): 64.8 Body mass index is 29.53 kg/m. Usual Weight: 77.3 kg   Assessment:  63 y.o. female s/p exploratory laparotomy, small bowel resection, repair of ventral hernia (10.5 cm) for incarcerated ventral hernia with small bowel obstruction, perforation, and feculent peritonitis, complicated by pertinent comorbidities including IV colon cancer with metastasis to the lung, uterus and liver. She meets criteria for malnutrition and last PO intake was 3/24.   Glucose / Insulin: BG 150-158, 2 unit aspart given in last 24 hours Electrolytes: WNL Na 140 ,K 4.1, Mg 1.8, Phos 2.7, Anion gap 9 Renal: Aki resolved with Scr = 0.88 today 4/2 Hepatic: LFTs wnl.  Intake / Output; MIVF: completed GI Imaging: Abdomen and pelvis CT:  1. Interval development of small-bowel obstruction with point of transition related to moderate complex ventral hernia which contains mesenteric fat, fluid and slightly thickened small bowel loop. Small amount of free gas within the hernia sac, concern for incarcerated hernia with possible bowel perforation. Small volume free fluid within the hernia. 2. Interval decrease in size of previously measured hepatic metastatic lesions. However interim finding of multiple subcentimeter hypodense liver lesions/suspected metastatic lesions not clearly seen on the prior exam. 3. Slight decreased size of cecal mass. Slightly decreased size of posterior pelvic mass that is probably associated with the right ovary/adnexa. 4. Slight decreased size of index left lower lobe pulmonary nodule, however there appear to be several new small pulmonary nodules at the lung bases concerning for metastatic disease. 5. Aortic atherosclerosis. GI Surgeries / Procedures: s/p exploratory laparotomy, small bowel  resection, repair of ventral hernia  Central access: plan for 3/31 TPN start date: plan for 3/31   Nutritional Goals: Goal TPN rate is 83 mL/hr (provides 106.85 g of protein and 2059 kcals per day)  RD Assessment: Estimated Needs Total Energy Estimated Needs: 2050-2250 Total Protein Estimated Needs: 105-120 grams Total Fluid Estimated Needs: 2-2.2 L  Current Nutrition:  NPO with NGT decompression > advanced to clear liquid on 4/3 TPN   *Patient would not meet kcal goal of at least 75% with the Clinimix, so compounded TPN started*  Plan:  Continue current TPN at 80 mL/hr. Noted surgery advancing diet today. Will follow patient tolerability to wean TNP as needed. Protein 101.7 g,  Dextrose 288g , Lipids 57.6 g (Total kcal 1962.6) Electrolytes in TPN: Na 38mEq/L, K 68mEq/L, Ca 61mEq/L, Mg 64mEq/L, and Phos 5mmol/L. Cl:Ac 1:1 Continue thiamine 100 mg IV x 7 days outpatient of TPN bag (last dose 4/6). Holding off on chromium.  Add standard MVI and trace elements to TPN Initiate Sensitive q4h SSI and adjust as needed  Monitor TPN labs on Mon/Thurs.  Alecia Doi Rodriguez-Guzman PharmD, BCPS 02/11/2024 8:29 AM

## 2024-02-11 NOTE — Progress Notes (Signed)
 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  The patient reports nausea and vomiting since since Wednesday of this week has noticed that her umbilical hernia is unreducible with increasing abdominal pain when it is touched along with nausea and vomiting since then.  She denies any flatus or bowel movement since Wednesday.  The patient denies any chest pain fever chills or shortness of breath.   Case discussed with the emergency room physician who has already discussed this case with the surgeon on-call and plans to take the patient to the operating room from the ER and request admission under hospital medicine services primary.   The patient's daughter at the bedside [name] confirmed the history as provided by the patient.     Past medical records reviewed and summarized the patient's last oncology outpatient progress note 02/01/2024: cycle 12 of FOLFOX plus Avastin planned.  Assessment and Plan:  Small bowel obstruction and perforation due to strangulated ventral hernia Focal peritonitis due to perforation Biopsy of small bowel resection shows adenocarcinoma. Continue IV Zosyn General Surgery consulted status post laparotomy, small bowel defect was closed, no bowel resection was performed. Tissue culture from the OR growing E. Coli, pansensitive except resistant to Cipro S/p NGT w/IS, d/c'd NG tube on 4/3 Continue abdominal drain 4/3 started on liquid diet Monitor electrolytes Continue TPN   Hypokalemia, potassium  repleted. Hypophosphatemia, Phos repleted. Monitor electrolytes and replete as needed. Pharmacy consulted to manage electrolytes and TPN  AKI due to dehydration.  Resolved Cr 1.48--0.88 renal functions improved Monitor renal functions Continue IV fluid for hydration  Elevated troponin most likely demand ischemia TTE LVEF 70 to 75%, no WMA, grade 1 diastolic dysfunction, no significant valvular abnormality.  NIDDM T2, HbA1c 6.5 well-controlled Held home medications for now Monitor CBG Continue NovoLog sliding scale  COPD, no exacerbation noticed on exam Continue albuterol nebs as needed  Hypertension, HLD Resumed hydralazine 50 mg p.o. 3 times daily Monitor BP and manage accordingly Use IV hydralazine as needed  Hypothyroid, resume Synthroid    Body mass index is 29.53 kg/m.  Nutrition Problem: Moderate Malnutrition Etiology: chronic illness (stage IV colon cancer) Interventions: Interventions: MVI, TPN   Diet: CLD and TPN DVT Prophylaxis: Subcutaneous Heparin    Advance goals of care discussion: Full code  Family Communication: family was not present at bedside, at the time of interview.  The pt provided permission to discuss medical plan with the family. Opportunity was given to ask question and all questions were answered satisfactorily.   Disposition:  Pt is from Home, admitted with SBO with perforation, s/p Exlap, still on Iv Abx, CLD and on TPN, which precludes a safe discharge. Discharge to Home, when stable, may need few days to improve.  Subjective: No significant events overnight.  Abdominal pain is well-controlled, patient is moving bowels had 3 BM yesterday and 2 BMs last night.  No nausea vomiting.  NG tube was removed. Patient has been started on clear liquid diet by general surgery, patient denied any other complaints.   Physical  Exam: General: NAD, lying comfortably Appear in no distress, affect appropriate Eyes: PERRLA ENT: Oral Mucosa Clear,  moist  Neck: no JVD,  Cardiovascular: S1 and S2 Present, no Murmur,  Respiratory: good respiratory effort, Bilateral Air entry equal and Decreased, no Crackles, no wheezes Abdomen: bowel sounds +, s/p ex lap, dressing CDI, JP drain intact, mid postop tenderness    Skin: no rashes Extremities: no Pedal edema, no calf tenderness Neurologic: without any new focal findings Gait not checked due to patient safety concerns  Vitals:   02/11/24 0500 02/11/24 0820 02/11/24 0901 02/11/24 1155  BP:  (!) 165/81 (!) 171/90 (!) 140/80  Pulse:  (!) 57 (!) 54 77  Resp:      Temp:  98.1 F (36.7 C) 98 F (36.7 C) 98.7 F (37.1 C)  TempSrc:  Oral  Oral  SpO2:  100% 100% 100%  Weight: 83 kg     Height:        Intake/Output Summary (Last 24 hours) at 02/11/2024 1406 Last data filed at 02/11/2024 0900 Gross per 24 hour  Intake 1180.83 ml  Output 1690 ml  Net -509.17 ml   Filed Weights   02/09/24 0500 02/10/24 0500 02/11/24 0500  Weight: 74.1 kg 75.6 kg 83 kg    Data Reviewed: I have personally reviewed and interpreted daily labs, tele strips, imagings as discussed above. I reviewed all nursing notes, pharmacy notes, vitals, pertinent old records I have discussed plan of care as described above with RN and patient/family.  CBC: Recent Labs  Lab 02/07/24 1807 02/08/24 0211 02/09/24 0445 02/10/24 0145 02/11/24 0515  WBC 7.0 4.3 7.5 5.9 5.5  HGB 14.5 11.9* 9.9* 9.5* 10.5*  HCT 43.8 35.0* 29.9* 28.3* 31.9*  MCV 90.5 91.1 92.0 91.9 93.3  PLT 255 149* 127* 130* 159   Basic Metabolic Panel: Recent Labs  Lab 02/08/24 0211 02/09/24 0445 02/09/24 1813 02/10/24 0145 02/11/24 0515  NA 135 135 134* 134* 140  K 3.0* 3.0* 3.7 3.3* 4.1  CL 95* 101 102 101 106  CO2 24 27 25 24 25   GLUCOSE 136* 121* 156* 125* 158*  BUN 68* 66* 53* 46* 21*  CREATININE 1.48* 1.31* 0.97 0.88 0.73  CALCIUM 7.6* 7.8* 7.7* 7.5* 8.2*  MG 1.9 2.1  --  2.3 1.8  PHOS 3.7 2.2*  --  2.4* 2.7    Studies: DG ABD  ACUTE 2+V W 1V CHEST Result Date: 02/11/2024 CLINICAL DATA:  Postoperative ileus. EXAM: DG ABDOMEN ACUTE WITH 1 VIEW CHEST COMPARISON:  February 09, 2024. FINDINGS: Nasogastric tube tip is seen in proximal stomach. Surgical drain is noted in the pelvis. Calcified uterine fibroid is noted in the pelvis. No acute cardiopulmonary abnormality is seen. Mildly dilated small bowel loops are again noted most consistent with postoperative ileus. No definite colonic dilatation is noted. IMPRESSION: Mildly dilated small bowel loops are again noted most consistent with postoperative ileus. Electronically Signed   By: Lupita Raider M.D.   On: 02/11/2024 11:17     Scheduled Meds:  sodium chloride   Intravenous Once   Chlorhexidine Gluconate Cloth  6 each Topical Daily   heparin  5,000 Units Subcutaneous Q8H   hydrALAZINE  25 mg Oral Q8H   insulin aspart  0-9 Units Subcutaneous Q4H   ketorolac  15 mg Intravenous Q6H   levothyroxine  100 mcg Oral Q0600   pantoprazole (PROTONIX) IV  40 mg Intravenous Daily   sodium chloride flush  10-40 mL Intracatheter Q12H  thiamine (VITAMIN B1) injection  100 mg Intravenous Daily   Continuous Infusions:  piperacillin-tazobactam (ZOSYN)  IV 3.375 g (02/11/24 1322)   TPN ADULT (ION) 80 mL/hr at 02/11/24 0751   TPN ADULT (ION)     PRN Meds: albuterol, hydrALAZINE, morphine injection, ondansetron (ZOFRAN) IV, sodium chloride flush  Time spent: 40 minutes  Author: Gillis Santa. MD [facility] 02/11/2024 2:06 PM  To reach On-call, see care teams to locate the attending and reach out to them via [email or web address]. If 7PM-7AM, please contact night-coverage If you still have difficulty reaching the attending provider, please page the [facility] (Director on Call) for [facility] on amion for assistance.

## 2024-02-11 NOTE — Progress Notes (Signed)
 Nutrition Follow-up  DOCUMENTATION CODES:   Non-severe (moderate) malnutrition in context of chronic illness  INTERVENTION:   -Obtain daily weights -TPN management per pharmacy -Recommend MVI in TPN -Recommend 100 mg thiamine daily x 7 days -Monitor Mg, K, and Phos and replete as needed secondary to high refeeding risk -RD will follow for ability to advance to PO diet/ enteral nutrition  NUTRITION DIAGNOSIS:   Moderate Malnutrition related to chronic illness (stage IV colon cancer) as evidenced by mild fat depletion, moderate fat depletion, mild muscle depletion, moderate muscle depletion.  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  The patient reports nausea and vomiting since 02/03/24 and has noticed that her umbilical hernia is unreducible with increasing abdominal pain when it is touched along with nausea and vomiting since then.  She denies any flatus or bowel movement since 02/03/24.  3/31- s/p Laparotomy; Small bowel resection; Reduction of ventral hernia with Repair strangulated ventral hernia measuring 10.5 cms, TPN initiated  4/3- NGT d/c, advanced to clear liquid diet  Reviewed I/O's: -749 ml x 24 hours and -1.6 L since admission  UOP: 600 ml x 24 hours  NGT output: 880 ml x 24 hours  Drain output: 210 ml x 24 hours  Pt sitting up in bed, eating clear liquids at time of visit. Pt pleasant and in good spirits, reports she is happy to eat and have NGT taken out. She states "this is the best cranberry juice I've every tasted".   Pt pleased with progress and reports  she had a BM today. RD discussed potential diet progression. Encouraged pt to eat what she can but assured that she was still receiving support from TPN. Pt continues on TPN at 80 ml/hr, which provides 1962 kcals and 102 grams protein, meeting 96% of estimated kcal needs and 97% of estimated protein needs.   Medications reviewed and include protonix and thiamine.   Labs reviewed: CBGS: 102-159 (inpatient orders for glycemic control are 0-9 units insulin asapart every 4 hours).    Diet Order:   Diet Order             Diet clear liquid Fluid consistency: Thin  Diet effective now                   EDUCATION NEEDS:   Education needs have been addressed  Skin:  Skin Assessment: Skin Integrity Issues: Skin Integrity Issues:: Incisions Incisions: closed abdomen  Last BM:  02/10/24 (type 6)  Height:   Ht Readings from Last 1 Encounters:  02/07/24 5\' 6"  (1.676 m)    Weight:   Wt Readings from Last 1 Encounters:  02/11/24 83 kg    Ideal Body Weight:  59.1 kg  BMI:  Body mass index is 29.53 kg/m.  Estimated Nutritional Needs:   Kcal:  2050-2250  Protein:  105-120 grams  Fluid:  2-2.2 L    Levada Schilling, RD, LDN, CDCES Registered Dietitian III Certified Diabetes Care and Education Specialist If unable to reach this RD, please use "RD Inpatient" group chat on secure chat between hours of  8am-4 pm daily

## 2024-02-12 ENCOUNTER — Telehealth: Payer: Self-pay | Admitting: Physician Assistant

## 2024-02-12 DIAGNOSIS — K5652 Intestinal adhesions [bands] with complete obstruction: Secondary | ICD-10-CM | POA: Diagnosis not present

## 2024-02-12 LAB — BASIC METABOLIC PANEL WITH GFR
Anion gap: 6 (ref 5–15)
BUN: 18 mg/dL (ref 6–20)
CO2: 25 mmol/L (ref 22–32)
Calcium: 8.4 mg/dL — ABNORMAL LOW (ref 8.9–10.3)
Chloride: 107 mmol/L (ref 98–111)
Creatinine, Ser: 0.85 mg/dL (ref 0.44–1.00)
GFR, Estimated: >60 mL/min (ref >60)
Glucose, Bld: 128 mg/dL — ABNORMAL HIGH (ref 70–99)
Potassium: 5 mmol/L (ref 3.5–5.1)
Sodium: 138 mmol/L (ref 135–145)

## 2024-02-12 LAB — GLUCOSE, CAPILLARY
Glucose-Capillary: 118 mg/dL — ABNORMAL HIGH (ref 70–99)
Glucose-Capillary: 127 mg/dL — ABNORMAL HIGH (ref 70–99)
Glucose-Capillary: 133 mg/dL — ABNORMAL HIGH (ref 70–99)
Glucose-Capillary: 142 mg/dL — ABNORMAL HIGH (ref 70–99)
Glucose-Capillary: 148 mg/dL — ABNORMAL HIGH (ref 70–99)
Glucose-Capillary: 152 mg/dL — ABNORMAL HIGH (ref 70–99)
Glucose-Capillary: 154 mg/dL — ABNORMAL HIGH (ref 70–99)

## 2024-02-12 LAB — CBC
HCT: 33.1 % — ABNORMAL LOW (ref 36.0–46.0)
Hemoglobin: 10.8 g/dL — ABNORMAL LOW (ref 12.0–15.0)
MCH: 30.4 pg (ref 26.0–34.0)
MCHC: 32.6 g/dL (ref 30.0–36.0)
MCV: 93.2 fL (ref 80.0–100.0)
Platelets: 150 10*3/uL (ref 150–400)
RBC: 3.55 MIL/uL — ABNORMAL LOW (ref 3.87–5.11)
RDW: 16.8 % — ABNORMAL HIGH (ref 11.5–15.5)
WBC: 4.6 10*3/uL (ref 4.0–10.5)
nRBC: 0 % (ref 0.0–0.2)

## 2024-02-12 LAB — MAGNESIUM: Magnesium: 1.9 mg/dL (ref 1.7–2.4)

## 2024-02-12 LAB — PHOSPHORUS: Phosphorus: 3.2 mg/dL (ref 2.5–4.6)

## 2024-02-12 MED ORDER — CIPROFLOXACIN HCL 500 MG PO TABS
500.0000 mg | ORAL_TABLET | Freq: Two times a day (BID) | ORAL | Status: DC
  Administered 2024-02-12 – 2024-02-13 (×2): 500 mg via ORAL
  Filled 2024-02-12 (×3): qty 1

## 2024-02-12 MED ORDER — METRONIDAZOLE 500 MG PO TABS
500.0000 mg | ORAL_TABLET | Freq: Two times a day (BID) | ORAL | Status: DC
  Administered 2024-02-12 – 2024-02-13 (×3): 500 mg via ORAL
  Filled 2024-02-12 (×3): qty 1

## 2024-02-12 MED ORDER — THIAMINE MONONITRATE 100 MG PO TABS
100.0000 mg | ORAL_TABLET | Freq: Every day | ORAL | Status: AC
Start: 2024-02-12 — End: 2024-02-14
  Administered 2024-02-12 – 2024-02-14 (×3): 100 mg via ORAL
  Filled 2024-02-12 (×3): qty 1

## 2024-02-12 MED ORDER — TRAVASOL 10 % IV SOLN
INTRAVENOUS | Status: AC
  Filled 2024-02-12: qty 508.8

## 2024-02-12 MED ORDER — ENSURE ENLIVE PO LIQD
237.0000 mL | Freq: Two times a day (BID) | ORAL | Status: DC
Start: 2024-02-12 — End: 2024-02-14
  Administered 2024-02-12 – 2024-02-14 (×5): 237 mL via ORAL

## 2024-02-12 NOTE — Progress Notes (Signed)
 Nutrition Follow-up  DOCUMENTATION CODES:   Non-severe (moderate) malnutrition in context of chronic illness  INTERVENTION:   -Continue daily weights -TPN management per pharmacy -Recommend MVI in TPN -Recommend 100 mg thiamine daily x 7 days -Monitor Mg, K, and Phos and replete as needed secondary to high refeeding risk -Ensure Enlive po BID, each supplement provides 350 kcal and 20 grams of protein.   NUTRITION DIAGNOSIS:   Moderate Malnutrition related to chronic illness (stage IV colon cancer) as evidenced by mild fat depletion, moderate fat depletion, mild muscle depletion, moderate muscle depletion.  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  The patient reports nausea and vomiting since 02/03/24 and has noticed that her umbilical hernia is unreducible with increasing abdominal pain when it is touched along with nausea and vomiting since then.  She denies any flatus or bowel movement since 02/03/24.  3/31- s/p Laparotomy; Small bowel resection; Reduction of ventral hernia with Repair strangulated ventral hernia measuring 10.5 cms, TPN initiated  4/3- NGT d/c, advanced to clear liquid diet 4/4- advanced to full liquid diet  Reviewed I/O's: +260 ml x 24 hours and -1.3 L since admission  Drains: 300 ml x 24 hours  Per general surgery notes, plan to advance to full liquids today and possibility for soft diet tonight. Likely plan to discharge home over the weekend.   Spoke with pt, who was sitting in recliner chair at time of visit. Pt pleasant and in good spirits, reports  tolerating clear liquids well and is excited for diet advancement. Pt looking forward to possibility of eating solid foods. Discussed diet progression and TPN wean. Pt amenable to Ensure. Pt appreciative for visit and shares that she is eager to go home when she is ready, sharing her daughter has packed a bag with her personal items.   Pt receiving TPN at 80 ml/hr, which provides 1962 kcals and 102 grams protein, meeting 96% of estimated kcal needs and 97% of estimated protein needs. Per discussion with pharmacist, plan to decrease TPN tonight to 40 ml/hr, which provides 981 kcals and 51 grams protein, meeting 48% of estimated kcal needs and 49% of estimated protein needs.   Medications reviewed and include cipro, protonix, and thiamine.   Labs reviewed: CBGS: 122-155 (inpatient orders for glycemic control are 0-9 units insulin aspart every 4 hours).    Diet Order:   Diet Order             Diet full liquid Fluid consistency: Thin  Diet effective now                   EDUCATION NEEDS:   Education needs have been addressed  Skin:  Skin Assessment: Skin Integrity Issues: Skin Integrity Issues:: Incisions Incisions: closed abdomen  Last BM:  02/10/24 (type 6)  Height:   Ht Readings from Last 1 Encounters:  02/07/24 5\' 6"  (1.676 m)    Weight:   Wt Readings from Last 1 Encounters:  02/12/24 75.8 kg    Ideal Body Weight:  59.1 kg  BMI:  Body mass index is 26.97  kg/m.  Estimated Nutritional Needs:   Kcal:  2050-2250  Protein:  105-120 grams  Fluid:  2-2.2 L    Levada Schilling, RD, LDN, CDCES Registered Dietitian III Certified Diabetes Care and Education Specialist If unable to reach this RD, please use "RD Inpatient" group chat on secure chat between hours of 8am-4 pm daily

## 2024-02-12 NOTE — Progress Notes (Signed)
 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  The patient reports nausea and vomiting since since Wednesday of this week has noticed that her umbilical hernia is unreducible with increasing abdominal pain when it is touched along with nausea and vomiting since then.  She denies any flatus or bowel movement since Wednesday.  The patient denies any chest pain fever chills or shortness of breath.   Case discussed with the emergency room physician who has already discussed this case with the surgeon on-call and plans to take the patient to the operating room from the ER and request admission under hospital medicine services primary.   The patient's daughter at the bedside [name] confirmed the history as provided by the patient.     Past medical records reviewed and summarized the patient's last oncology outpatient progress note 02/01/2024: cycle 12 of FOLFOX plus Avastin planned.  Assessment and Plan:  Small bowel obstruction and perforation due to strangulated ventral hernia Focal peritonitis due to perforation Biopsy of small bowel resection shows adenocarcinoma. Continue IV Zosyn General Surgery consulted status post laparotomy, small bowel defect was closed, no bowel resection was performed. Tissue culture from the OR growing E. Coli, pansensitive except resistant to Cipro S/p NGT w/IS, d/c'd NG tube on 4/3 Continue abdominal drain 4/4 diet advanced to full liquid, Wean down TPN Monitor electrolytes As per general surgery  most likely patient could be discharged home tomorrow on 1 week of antibiotics.  Cipro and Flagyl   Hypokalemia, potassium repleted. Hypophosphatemia, Phos repleted. Monitor electrolytes and replete as needed. Pharmacy consulted to manage electrolytes and TPN  AKI due to dehydration.  Resolved Cr 1.48--0.88 renal functions improved Monitor renal functions Continue IV fluid for hydration  Elevated troponin most likely demand ischemia TTE LVEF 70 to 75%, no WMA, grade 1 diastolic dysfunction, no significant valvular abnormality.  NIDDM T2, HbA1c 6.5 well-controlled Held home medications for now Monitor CBG Continue NovoLog sliding scale  COPD, no exacerbation noticed on exam Continue albuterol nebs as needed  Hypertension, HLD Resumed hydralazine 50 mg p.o. 3 times daily Monitor BP and manage accordingly Use IV hydralazine as needed  Hypothyroid, resume Synthroid    Body mass index is 26.97 kg/m.  Nutrition Problem: Moderate Malnutrition Etiology: chronic illness (stage IV colon cancer) Interventions: Interventions: MVI, TPN   Diet: CLD and TPN DVT Prophylaxis: Subcutaneous Heparin    Advance goals of care discussion: Full code  Family Communication: family was not present at bedside, at the time of interview.  The pt provided permission to discuss medical plan with the family. Opportunity was given to ask question and all questions were answered satisfactorily.   Disposition:  Pt is from Home, admitted with SBO with perforation, s/p Exlap, still on Iv Abx, CLD and on TPN, which precludes a safe discharge. Discharge to Home, when stable, may need few days to improve.  Subjective: No significant events overnight.  Abdominal pain 4/10, patient is passing gas, she had 2 BM today.  Denied any other complaints, tolerating diet well.  No  nausea vomiting. Patient feels that she will be able to go home tomorrow   Physical Exam: General: NAD, lying comfortably Appear in  no distress, affect appropriate Eyes: PERRLA ENT: Oral Mucosa Clear, moist  Neck: no JVD,  Cardiovascular: S1 and S2 Present, no Murmur,  Respiratory: good respiratory effort, Bilateral Air entry equal and Decreased, no Crackles, no wheezes Abdomen: bowel sounds +, s/p ex lap, dressing CDI, JP drain intact, mid postop tenderness    Skin: no rashes Extremities: no Pedal edema, no calf tenderness Neurologic: without any new focal findings Gait not checked due to patient safety concerns  Vitals:   02/12/24 0514 02/12/24 0522 02/12/24 0859 02/12/24 1226  BP: (!) 151/94  (!) 151/90 (!) 146/94  Pulse: 61  69 70  Resp: 18  16 16   Temp: 98 F (36.7 C)  (!) 97.4 F (36.3 C) 98 F (36.7 C)  TempSrc: Oral     SpO2: 97%  100% 100%  Weight:  75.8 kg    Height:        Intake/Output Summary (Last 24 hours) at 02/12/2024 1516 Last data filed at 02/12/2024 0840 Gross per 24 hour  Intake 275 ml  Output 420 ml  Net -145 ml   Filed Weights   02/10/24 0500 02/11/24 0500 02/12/24 0522  Weight: 75.6 kg 83 kg 75.8 kg    Data Reviewed: I have personally reviewed and interpreted daily labs, tele strips, imagings as discussed above. I reviewed all nursing notes, pharmacy notes, vitals, pertinent old records I have discussed plan of care as described above with RN and patient/family.  CBC: Recent Labs  Lab 02/08/24 0211 02/09/24 0445 02/10/24 0145 02/11/24 0515 02/12/24 0517  WBC 4.3 7.5 5.9 5.5 4.6  HGB 11.9* 9.9* 9.5* 10.5* 10.8*  HCT 35.0* 29.9* 28.3* 31.9* 33.1*  MCV 91.1 92.0 91.9 93.3 93.2  PLT 149* 127* 130* 159 150   Basic Metabolic Panel: Recent Labs  Lab 02/08/24 0211 02/09/24 0445 02/09/24 1813 02/10/24 0145 02/11/24 0515 02/12/24 0517  NA 135 135 134* 134* 140 138  K 3.0* 3.0* 3.7 3.3* 4.1 5.0  CL 95* 101 102 101 106 107  CO2 24 27 25 24 25 25   GLUCOSE 136* 121* 156* 125* 158* 128*  BUN 68* 66* 53* 46* 21* 18  CREATININE 1.48* 1.31* 0.97 0.88 0.73 0.85  CALCIUM  7.6* 7.8* 7.7* 7.5* 8.2* 8.4*  MG 1.9 2.1  --  2.3 1.8 1.9  PHOS 3.7 2.2*  --  2.4* 2.7 3.2    Studies: No results found.    Scheduled Meds:  sodium chloride   Intravenous Once   Chlorhexidine Gluconate Cloth  6 each Topical Daily   ciprofloxacin  500 mg Oral BID   feeding supplement  237 mL Oral BID BM   heparin  5,000 Units Subcutaneous Q8H   hydrALAZINE  50 mg Oral Q8H   insulin aspart  0-9 Units Subcutaneous Q4H   ketorolac  15 mg Intravenous Q6H   levothyroxine  100 mcg Oral Q0600   metroNIDAZOLE  500 mg Oral Q12H   pantoprazole (PROTONIX) IV  40 mg Intravenous Daily   sodium chloride flush  10-40 mL Intracatheter Q12H   thiamine  100 mg Oral Daily   Continuous Infusions:  TPN ADULT (ION) 80 mL/hr at 02/11/24 1836   TPN ADULT (ION)     PRN Meds: albuterol, hydrALAZINE, morphine injection, ondansetron (ZOFRAN) IV, sodium chloride flush  Time spent: 40 minutes  Author: Gillis Santa. MD [facility] 02/12/2024 3:16 PM  To reach On-call, see care teams to locate the attending and reach out to them via [email or web address]. If 7PM-7AM, please contact night-coverage If you still have difficulty reaching the attending provider, please page the [facility] (Director on Call) for [facility] on amion for assistance.

## 2024-02-12 NOTE — Progress Notes (Signed)
 Patient alert and oriented x4. JP drain's output was of serous fluids overnight. Patient's blood sugars were 134, 159, 154 and 142 overnight which all required a small coverage of 1-2 units. Patient wants to defer midline dressing change until later. Denied pain. Had bowel movement this morning. Ambulates well with standby assistance. Additional needs denied.

## 2024-02-12 NOTE — Progress Notes (Signed)
 [facility] SURGICAL PROGRESS NOTE  Hospital Day(s): 5.   Post op day(s): 5 Days Post-Op.   Interval History:  Patient seen and examined same day as surgery No acute events or new complaints overnight.  Patient reports she is doing well - getting up to the chair this AM Abdomen is sore but doing well  Had multiple BM in last 24 hours No fever, chills, nausea WBC normal at 4.6K Hgb to 10.8; stable Renal function normalized; sCr - 0.85; UO - unmeasured No electrolyte derangement this AM Surgical drain; 300 ccs - serous She is on Zosyn She is on CLD; tolerating - hungry  Continues to have bowel function   Vital signs in last 24 hours: [min-max] current  Temp:  [98 F (36.7 C)-98.8 F (37.1 C)] 98.5 F (36.9 C) (04/04 0107) Pulse Rate:  [54-77] 62 (04/04 0107) Resp:  [18] 18 (04/04 0107) BP: (140-171)/(69-98) 158/69 (04/04 0107) SpO2:  [98 %-100 %] 100 % (04/04 0107) Weight:  [75.8 kg] 75.8 kg (04/04 0522)     Height: 5\' 6"  (167.6 cm) Weight: 75.8 kg BMI (Calculated): 26.98   Intake/Output last 2 shifts:  04/03 0701 - 04/04 0700 In: 560 [P.O.:480] Out: 300 [Drains:300]   Physical Exam:  Constitutional: alert, cooperative and no distress  Respiratory: breathing non-labored at rest  Cardiovascular: regular rate and sinus rhythm  Gastrointestinal: soft, non-tender, and non-distended, no rebound/guarding. Drain in left abdomen; serous  Integumentary: Laparotomy healing via secondary intention, no drainage, no erythema   Labs:     Latest Ref Rng & Units 02/12/2024    5:17 AM 02/11/2024    5:15 AM 02/10/2024    1:45 AM  CBC  WBC 4.0 - 10.5 K/uL 4.6  5.5  5.9   Hemoglobin 12.0 - 15.0 g/dL 16.1  09.6  9.5   Hematocrit 36.0 - 46.0 % 33.1  31.9  28.3   Platelets 150 - 400 K/uL 150  159  130       Latest Ref Rng & Units 02/12/2024    5:17 AM 02/11/2024    5:15 AM 02/10/2024    1:45 AM  CMP  Glucose 70 - 99 mg/dL 045  409  811   BUN 6 - 20 mg/dL 18  21  46    Creatinine 0.44 - 1.00 mg/dL 9.14  7.82  9.56   Sodium 135 - 145 mmol/L 138  140  134   Potassium 3.5 - 5.1 mmol/L 5.0  4.1  3.3   Chloride 98 - 111 mmol/L 107  106  101   CO2 22 - 32 mmol/L 25  25  24    Calcium 8.9 - 10.3 mg/dL 8.4  8.2  7.5   Total Protein 6.5 - 8.1 g/dL  6.7    Total Bilirubin 0.0 - 1.2 mg/dL  0.5    Alkaline Phos 38 - 126 U/L  50    AST 15 - 41 U/L  28    ALT 0 - 44 U/L  12      Imaging studies:  No new imaging studies    Assessment/Plan:  63 y.o. female with resolving post-operative ileus 5 Days Post-Op s/p exploratory laparotomy, small bowel resection, repair of ventral hernia (10.5 cm) for incarcerated ventral hernia with small bowel obstruction, perforation, and feculent peritonitis, complicated by pertinent comorbidities including IV colon cancer with metastasis to the lung, uterus and liver    - Will advance to FLD this AM; Okay to advance to soft diet for  dinner  - Continue TPN; wean to 1/2 rate - anticipate DC of this tomorrow (04/05)  - Continue surgical drain; monitor and record output - suspect we can remove this prior to DC as this has been serous.   - Continue IV Abx (Zosyn); Day 5 - likely needs 14 days total (IV + PO) given feculent peritonitis intra-operatively  - Wound Care: Daily and PRN dressing changes with saline moistened Kerlix gauze, cover, secure.  - Monitor abdominal examination; on-going bowel function  - Pain control prn; antiemetics prn - Mobilize; therapies on board - will order abdominal binder per request   - Further management per primary service; we will follow     - Discharge Planning: Doing well, ROBF, diet advancing, weaning TPN. Anticipate ready for DC from surgical perspective tomorrow or Sunday. Will update instructions and follow up.   All of the above findings and recommendations were discussed with the patient, and the medical team, and all of patient's questions were answered to her expressed  satisfaction.  -- Lynden Oxford, PA-C [facility] 02/12/2024, 7:14 AM M-F: 7am - 4pm

## 2024-02-12 NOTE — Discharge Instructions (Signed)
 In addition to included general post-operative instructions,  Diet: Resume home diet gradually  Activity: No heavy lifting >20 pounds (children, pets, laundry, garbage) or strenuous activity for 6 weeks from date of surgery (03/30), but light activity and walking are encouraged. Do not drive or drink alcohol if taking narcotic pain medications or having pain that might distract from driving.   Wound care: Pack wound with gauze, cover with ABD pad, secure with tape. This can be changed daily and as needed. It is okay to remove dressing completely and take a shower shower. Do not scrub or submerge wound. Pat this dry and replace dressing.   Medications: Resume all home medications. For mild to moderate pain: acetaminophen (Tylenol) or ibuprofen/naproxen (if no kidney disease). Combining Tylenol with alcohol can substantially increase your risk of causing liver disease. Narcotic pain medications, if prescribed, can be used for severe pain, though may cause nausea, constipation, and drowsiness. Do not combine Tylenol and Percocet (or similar) within a 6 hour period as Percocet (and similar) contain(s) Tylenol. If you do not need the narcotic pain medication, you do not need to fill the prescription.  Call office [phone]) at any time if any questions, worsening pain, fevers/chills, bleeding, drainage from incision site, or other concerns.

## 2024-02-12 NOTE — Plan of Care (Signed)

## 2024-02-12 NOTE — Consult Note (Signed)
 PHARMACY - TOTAL PARENTERAL NUTRITION CONSULT NOTE   Indication: Prolonged ileus  Patient Measurements: Height: 5\' 6"  (167.6 cm) Weight: 75.8 kg (167 lb 1.6 oz) IBW/kg (Calculated) : 59.3 TPN AdjBW (KG): 64.8 Body mass index is 26.97 kg/m. Usual Weight: 77.3 kg   Assessment:  63 y.o. female s/p exploratory laparotomy, small bowel resection, repair of ventral hernia (10.5 cm) for incarcerated ventral hernia with small bowel obstruction, perforation, and feculent peritonitis, complicated by pertinent comorbidities including IV colon cancer with metastasis to the lung, uterus and liver. She meets criteria for malnutrition and last PO intake was 3/24.   Glucose / Insulin: BG 128-142, 8 unit aspart given in last 24 hours Electrolytes: WNL Na 138 ,K 5.0, Mg 1.9, Phos 3.2, Anion gap 6 Renal: Aki resolved with Scr = 0.88 today 4/2 Hepatic: LFTs wnl.  Intake / Output; MIVF: completed GI Imaging: Abdomen and pelvis CT:  1. Interval development of small-bowel obstruction with point of transition related to moderate complex ventral hernia which contains mesenteric fat, fluid and slightly thickened small bowel loop. Small amount of free gas within the hernia sac, concern for incarcerated hernia with possible bowel perforation. Small volume free fluid within the hernia. 2. Interval decrease in size of previously measured hepatic metastatic lesions. However interim finding of multiple subcentimeter hypodense liver lesions/suspected metastatic lesions not clearly seen on the prior exam. 3. Slight decreased size of cecal mass. Slightly decreased size of posterior pelvic mass that is probably associated with the right ovary/adnexa. 4. Slight decreased size of index left lower lobe pulmonary nodule, however there appear to be several new small pulmonary nodules at the lung bases concerning for metastatic disease. 5. Aortic atherosclerosis. GI Surgeries / Procedures: s/p exploratory laparotomy, small  bowel resection, repair of ventral hernia  Central access: plan for 3/31 TPN start date: plan for 3/31   Nutritional Goals: Goal TPN rate is 83 mL/hr (provides 106.85 g of protein and 2059 kcals per day)  RD Assessment: Estimated Needs Total Energy Estimated Needs: 2050-2250 Total Protein Estimated Needs: 105-120 grams Total Fluid Estimated Needs: 2-2.2 L  Current Nutrition:  NPO with NGT decompression > advanced to clear liquid on 4/3 TPN   *Patient would not meet kcal goal of at least 75% with the Clinimix, so compounded TPN started*  Plan:  Decrease TPN to 40 mL/hr (1/2 rate) in preparation for weekend dc. Patient tolerating clear liquid diet, advanced to soft diet today 4/4. Nutrients: Protein 50.88 g,  Dextrose 144g , Lipids 28.8g (Total kcal 981) Electrolytes in TPN: Na 38mEq/L, K 42mEq/L, Ca 27mEq/L, Mg 11mEq/L, and Phos 2mmol/L. Cl:Ac 1:1 Change thiamine 100 mg IV  to po for the remaining of therapy. Holding off on chromium.  Add standard MVI and trace elements to TPN Continue Sensitive q4h SSI and adjust as needed  Monitor TPN labs on Mon/Thurs.  Rayni Nemitz Rodriguez-Guzman PharmD, BCPS 02/12/2024 8:07 AM

## 2024-02-12 NOTE — Telephone Encounter (Signed)
 Patient called to cancel 4/7 appointment due to being hospitalized and having major surgery. She said she can get rescheduled appointments from mychart.

## 2024-02-12 NOTE — Progress Notes (Signed)
 PT Cancellation Note  Patient Details Name: Alexis Garcia MRN: [id number] DOB: 05-06-1961   Cancelled Treatment:    Reason Eval/Treat Not Completed: Other (comment) (Patient declined. She reports just having walked in the hallway. Will continue with attempts.)  Donna Bernard, PT, MPT  Ina Homes 02/12/2024, 12:22 PM

## 2024-02-12 NOTE — Progress Notes (Signed)
 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  The patient reports nausea and vomiting since since Wednesday of this week has noticed that her umbilical hernia is unreducible with increasing abdominal pain when it is touched along with nausea and vomiting since then.  She denies any flatus or bowel movement since Wednesday.  The patient denies any chest pain fever chills or shortness of breath. S/P laporatomy 02/08/24.   OT comments  Upon entering the room, pt supine in bed and agreeable to OT intervention. Pt reports she is feeling better today. She is currently connected to IV and maneuvers IV pole for mobility without assistance. Pt ambulating 500'+ and then takes self into bathroom for toileting needs. Hygiene performed independently as well before returning to recliner chair at end of session. Pt is at mod I level with no LOB this session. Pt's goals have been met and OT to complete orders at this time.        If plan is discharge home, recommend the following:  A little help with walking and/or transfers;A little help with bathing/dressing/bathroom;Assistance with cooking/housework;Assist for transportation;Help with stairs or ramp for entrance   Equipment Recommendations  None recommended by OT       Precautions / Restrictions Precautions Precautions: Fall Recall of Precautions/Restrictions: Intact       Mobility Bed Mobility Overal bed mobility: Modified Independent                  Transfers Overall transfer level: Modified independent Equipment used: None                         ADL  either performed or assessed with clinical judgement   ADL Overall ADL's : Modified independent                                            Extremity/Trunk Assessment Upper Extremity Assessment Upper Extremity Assessment: Overall WFL for tasks assessed            Vision Patient Visual Report: No change from baseline           Communication Communication Communication: No apparent difficulties   Cognition Arousal: Alert Behavior During Therapy: WFL for tasks assessed/performed Cognition: No apparent impairments                               Following commands: Intact                      Pertinent Vitals/ Pain       Pain Assessment Pain Assessment: No/denies pain         Frequency  Other (comment) (all goals met)        Progress Toward Goals  OT Goals(current goals can now be found in the care plan section)  Progress towards OT goals: Goals met/education completed, patient discharged from OT      AM-PAC OT "6 Clicks" Daily Activity     Outcome  Measure   Help from another person eating meals?: None Help from another person taking care of personal grooming?: None Help from another person toileting, which includes using toliet, bedpan, or urinal?: None Help from another person bathing (including washing, rinsing, drying)?: None Help from another person to put on and taking off regular upper body clothing?: None Help from another person to put on and taking off regular lower body clothing?: None 6 Click Score: 24    End of Session    OT Visit Diagnosis: Unsteadiness on feet (R26.81);Muscle weakness (generalized) (M62.81)   Activity Tolerance Patient tolerated treatment well   Patient Left with call bell/phone within reach;in chair   Nurse Communication Mobility status        Time: 6295-2841 OT Time Calculation (min): 18 min  Charges: OT General Charges $OT Visit: 1 Visit OT Treatments $Self Care/Home  Management : 8-22 mins  Jackquline Denmark, MS, OTR/L , CBIS ascom [phone]  02/12/24, 1:21 PM

## 2024-02-13 ENCOUNTER — Other Ambulatory Visit: Payer: Self-pay

## 2024-02-13 DIAGNOSIS — K5652 Intestinal adhesions [bands] with complete obstruction: Secondary | ICD-10-CM | POA: Diagnosis not present

## 2024-02-13 LAB — CBC
HCT: 31.9 % — ABNORMAL LOW (ref 36.0–46.0)
Hemoglobin: 10.3 g/dL — ABNORMAL LOW (ref 12.0–15.0)
MCH: 30.4 pg (ref 26.0–34.0)
MCHC: 32.3 g/dL (ref 30.0–36.0)
MCV: 94.1 fL (ref 80.0–100.0)
Platelets: 153 10*3/uL (ref 150–400)
RBC: 3.39 MIL/uL — ABNORMAL LOW (ref 3.87–5.11)
RDW: 17.2 % — ABNORMAL HIGH (ref 11.5–15.5)
WBC: 4.7 10*3/uL (ref 4.0–10.5)
nRBC: 0 % (ref 0.0–0.2)

## 2024-02-13 LAB — CULTURE, BLOOD (ROUTINE X 2)
Culture: NO GROWTH
Culture: NO GROWTH
Special Requests: ADEQUATE
Special Requests: ADEQUATE

## 2024-02-13 LAB — AEROBIC/ANAEROBIC CULTURE W GRAM STAIN (SURGICAL/DEEP WOUND)

## 2024-02-13 LAB — BASIC METABOLIC PANEL WITH GFR
Anion gap: 5 (ref 5–15)
BUN: 19 mg/dL (ref 6–20)
CO2: 22 mmol/L (ref 22–32)
Calcium: 8.4 mg/dL — ABNORMAL LOW (ref 8.9–10.3)
Chloride: 110 mmol/L (ref 98–111)
Creatinine, Ser: 0.78 mg/dL (ref 0.44–1.00)
GFR, Estimated: >60 mL/min (ref >60)
Glucose, Bld: 122 mg/dL — ABNORMAL HIGH (ref 70–99)
Potassium: 4.8 mmol/L (ref 3.5–5.1)
Sodium: 137 mmol/L (ref 135–145)

## 2024-02-13 LAB — MAGNESIUM: Magnesium: 1.9 mg/dL (ref 1.7–2.4)

## 2024-02-13 LAB — GLUCOSE, CAPILLARY
Glucose-Capillary: 114 mg/dL — ABNORMAL HIGH (ref 70–99)
Glucose-Capillary: 115 mg/dL — ABNORMAL HIGH (ref 70–99)
Glucose-Capillary: 120 mg/dL — ABNORMAL HIGH (ref 70–99)
Glucose-Capillary: 124 mg/dL — ABNORMAL HIGH (ref 70–99)
Glucose-Capillary: 177 mg/dL — ABNORMAL HIGH (ref 70–99)
Glucose-Capillary: 179 mg/dL — ABNORMAL HIGH (ref 70–99)

## 2024-02-13 LAB — PHOSPHORUS: Phosphorus: 3.4 mg/dL (ref 2.5–4.6)

## 2024-02-13 MED ORDER — AMOXICILLIN-POT CLAVULANATE 875-125 MG PO TABS
1.0000 | ORAL_TABLET | Freq: Two times a day (BID) | ORAL | Status: DC
  Administered 2024-02-13: 1 via ORAL
  Filled 2024-02-13 (×2): qty 1

## 2024-02-13 MED ORDER — AMOXICILLIN-POT CLAVULANATE 875-125 MG PO TABS
1.0000 | ORAL_TABLET | Freq: Two times a day (BID) | ORAL | Status: DC
  Administered 2024-02-14: 1 via ORAL
  Filled 2024-02-13: qty 1

## 2024-02-13 NOTE — Progress Notes (Signed)
 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  The patient reports nausea and vomiting since since Wednesday of this week has noticed that her umbilical hernia is unreducible with increasing abdominal pain when it is touched along with nausea and vomiting since then.  She denies any flatus or bowel movement since Wednesday.  The patient denies any chest pain fever chills or shortness of breath.   Case discussed with the emergency room physician who has already discussed this case with the surgeon on-call and plans to take the patient to the operating room from the ER and request admission under hospital medicine services primary.   The patient's daughter at the bedside Alexis Garcia confirmed the history as provided by the patient.     Past medical records reviewed and summarized the patient's last oncology outpatient progress note 02/01/2024: cycle 12 of FOLFOX plus Avastin planned.  Assessment and Plan:  Small bowel obstruction and perforation due to strangulated ventral hernia Focal peritonitis due to perforation Biopsy of small bowel resection shows adenocarcinoma. S/p IV Zosyn DC'd on 4/4 and transition to Cipro and Flagyl by general surgery. 4/5 DC'd Cipro and Flagyl, E. coli resistant to Cipro.  Started Augmentin General Surgery consulted status post laparotomy, small bowel defect was closed, no bowel resection was performed. Tissue culture from the OR growing E. Coli, pansensitive except resistant to Cipro S/p NGT w/IS, d/c'd NG  tube on 4/3 4/5 started soft diet today, Wean down TPN.  Will finish TPN today, no need to restart new bag Monitor electrolytes DC tomorrow a.m. on Augmentin for 1 week.  Hypokalemia, potassium repleted.  Resolved Hypophosphatemia, Phos repleted.  Resolved Monitor electrolytes and replete as needed. Pharmacy consulted to manage electrolytes and TPN  AKI due to dehydration.  Resolved Cr 1.48--0.78 renal functions improved Monitor renal functions Continue IV fluid for hydration  Elevated troponin most likely demand ischemia TTE LVEF 70 to 75%, no WMA, grade 1 diastolic dysfunction, no significant valvular abnormality.  NIDDM T2, HbA1c 6.5 well-controlled Held home medications for now Monitor CBG Continue NovoLog sliding scale  COPD, no exacerbation noticed on exam Continue albuterol nebs as needed  Hypertension, HLD Resumed hydralazine 50 mg p.o. 3 times daily Monitor BP and manage accordingly Use IV hydralazine as needed  Hypothyroid, resume Synthroid    Body mass index is 27.68 kg/m.  Nutrition Problem: Moderate Malnutrition Etiology: chronic illness (stage IV colon cancer) Interventions: Interventions: MVI, TPN   Diet: Soft diet DVT Prophylaxis: Subcutaneous Heparin    Advance goals of care discussion: Full code  Family Communication: family was not present at bedside, at the time of interview.  The pt provided permission to discuss medical plan with the family. Opportunity was given to ask question and all questions were answered satisfactorily.   Disposition:  Pt is from Home, admitted with SBO with perforation, s/p Exlap, s/p IV Abx and diet advanced.  TPN will be weaned off today.  Medically stable, Discharge to Home tomorrow a.m. on oral antibiotics for 1 week  Subjective: No significant events  overnight.  Abdominal pain 3/10, well-controlled, patient is walking around, passing gas and had 2 BMs today.  Tolerating diet well, we will plan to advance her diet  today and finish TPN.  Discharge tomorrow a.m.  Physical Exam: General: NAD, lying comfortably Appear in no distress, affect appropriate Eyes: PERRLA ENT: Oral Mucosa Clear, moist  Neck: no JVD,  Cardiovascular: S1 and S2 Present, no Murmur,  Respiratory: good respiratory effort, Bilateral Air entry equal and Decreased, no Crackles, no wheezes Abdomen: bowel sounds +, s/p ex lap, dressing CDI, mid postop tenderness    Skin: no rashes Extremities: no Pedal edema, no calf tenderness Neurologic: without any new focal findings Gait not checked due to patient safety concerns  Vitals:   02/13/24 0449 02/13/24 0547 02/13/24 0900 02/13/24 1210  BP: (!) 150/81  (!) 149/85 (!) 152/93  Pulse: (!) 58  69 75  Resp: 18  17 20   Temp: 98 F (36.7 C)  98.7 F (37.1 C) 98.3 F (36.8 C)  TempSrc: Oral  Oral Oral  SpO2: 100%  100% 100%  Weight:  77.8 kg    Height:        Intake/Output Summary (Last 24 hours) at 02/13/2024 1521 Last data filed at 02/12/2024 2145 Gross per 24 hour  Intake 240 ml  Output --  Net 240 ml   Filed Weights   02/11/24 0500 02/12/24 0522 02/13/24 0547  Weight: 83 kg 75.8 kg 77.8 kg    Data Reviewed: I have personally reviewed and interpreted daily labs, tele strips, imagings as discussed above. I reviewed all nursing notes, pharmacy notes, vitals, pertinent old records I have discussed plan of care as described above with RN and patient/family.  CBC: Recent Labs  Lab 02/09/24 0445 02/10/24 0145 02/11/24 0515 02/12/24 0517 02/13/24 0456  WBC 7.5 5.9 5.5 4.6 4.7  HGB 9.9* 9.5* 10.5* 10.8* 10.3*  HCT 29.9* 28.3* 31.9* 33.1* 31.9*  MCV 92.0 91.9 93.3 93.2 94.1  PLT 127* 130* 159 150 153   Basic Metabolic Panel: Recent Labs  Lab 02/09/24 0445 02/09/24 1813 02/10/24 0145 02/11/24 0515 02/12/24 0517 02/13/24 0456  NA 135 134* 134* 140 138 137  K 3.0* 3.7 3.3* 4.1 5.0 4.8  CL 101 102 101 106 107 110  CO2 27 25 24 25 25 22   GLUCOSE 121* 156* 125* 158*  128* 122*  BUN 66* 53* 46* 21* 18 19  CREATININE 1.31* 0.97 0.88 0.73 0.85 0.78  CALCIUM 7.8* 7.7* 7.5* 8.2* 8.4* 8.4*  MG 2.1  --  2.3 1.8 1.9 1.9  PHOS 2.2*  --  2.4* 2.7 3.2 3.4    Studies: No results found.    Scheduled Meds:  sodium chloride   Intravenous Once   amoxicillin-clavulanate  1 tablet Oral Q12H   Chlorhexidine Gluconate Cloth  6 each Topical Daily   feeding supplement  237 mL Oral BID BM   heparin  5,000 Units Subcutaneous Q8H   hydrALAZINE  50 mg Oral Q8H   insulin aspart  0-9 Units Subcutaneous Q4H   levothyroxine  100 mcg Oral Q0600   pantoprazole (PROTONIX) IV  40 mg Intravenous Daily   sodium chloride flush  10-40 mL Intracatheter Q12H   thiamine  100 mg Oral Daily   Continuous Infusions:  TPN ADULT (ION) 40 mL/hr at 02/12/24 1901   PRN Meds: albuterol, hydrALAZINE, morphine injection, ondansetron (ZOFRAN) IV, sodium chloride flush  Time spent: 40 minutes  Author: Gillis Santa. MD [facility] 02/13/2024 3:21 PM  To reach On-call, see care teams to locate the attending and reach out to them via [email or web address]. If 7PM-7AM, please contact night-coverage If you still have difficulty reaching the attending provider, please page the [facility] (Director on Call) for [facility] on amion for assistance.

## 2024-02-13 NOTE — Progress Notes (Signed)
 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  The patient reports nausea and vomiting since since Wednesday of this week has noticed that her umbilical hernia is unreducible with increasing abdominal pain when it is touched along with nausea and vomiting since then.  She denies any flatus or bowel movement since Wednesday.  The patient denies any chest pain fever chills or shortness of breath. S/P laporatomy 02/08/24.    PT Comments  Pt is progressing well with mobility performing bed mobility, tranfers, ambulation in room, and low level functional standing balance at ModI level.  Pt performs longer ambulation distances at supervision level and requires supervision or intermittent UE support for higher level dynamic standing balance with narrow BOS or single leg stance (eg. stepping up or over object).  Continued PT will assist pt towards greater dynamic standing balance, LE strengthening, and activity tolerance to increase safety and independence and decrease burden of care with functional mobility.      If plan is discharge home, recommend the following: Assistance with cooking/housework;Assist for transportation;Help with stairs or ramp for entrance   Can travel by private vehicle        Equipment Recommendations  None recommended by PT    Recommendations for Other Services       Precautions / Restrictions Precautions Precautions: Fall Recall of Precautions/Restrictions: Intact Restrictions Weight Bearing Restrictions Per Provider Order: No     Mobility  Bed Mobility               General bed mobility  comments: NT, coming out from bathroom at beginning of session. seated in recliner chair end of session    Transfers Overall transfer level: Modified independent Equipment used: None Transfers: Sit to/from Stand, Bed to chair/wheelchair/BSC Sit to Stand: Modified independent (Device/Increase time)           General transfer comment: not needing I V pole for support.    Ambulation/Gait Ambulation/Gait assistance: Supervision Gait Distance (Feet): 200 Feet Assistive device: None Gait Pattern/deviations: Step-through pattern Gait velocity: decreased     General Gait Details: occasionally, lightly touched rail in hallway, but did not necessarily need support.   Stairs             Wheelchair Mobility     Tilt Bed    Modified Rankin (Stroke Patients Only)       Balance Overall balance assessment: Modified Independent, Needs assistance   Sitting balance-Leahy Scale: Normal     Standing balance support: During functional activity, No upper extremity supported, Single extremity supported Standing balance-Leahy Scale: Good Standing balance comment: required supervision or intermittent UE support for higher level dynamic standing balance with narrow BOS or single le stance (eg. stepping up or over object)                            Communication Communication Communication: No apparent difficulties  Cognition Arousal: Alert Behavior During Therapy: WFL for tasks assessed/performed   PT - Cognitive impairments: No apparent impairments  Cueing Cueing Techniques: Verbal cues  Exercises Other Exercises Other Exercises: gave pt handout for LE strengthening exs pt can perform in room.    General Comments        Pertinent Vitals/Pain Pain Assessment Pain Assessment: No/denies pain    Home Living                          Prior Function            PT Goals (current goals can now be found  in the care plan section) Acute Rehab PT Goals Patient Stated Goal: to return home PT Goal Formulation: With patient Time For Goal Achievement: 02/23/24 Potential to Achieve Goals: Good Progress towards PT goals: Progressing toward goals    Frequency    Min 3X/week      PT Plan      Co-evaluation              AM-PAC PT "6 Clicks" Mobility   Outcome Measure  Help needed turning from your back to your side while in a flat bed without using bedrails?: None Help needed moving from lying on your back to sitting on the side of a flat bed without using bedrails?: None Help needed moving to and from a bed to a chair (including a wheelchair)?: None Help needed standing up from a chair using your arms (e.g., wheelchair or bedside chair)?: None Help needed to walk in hospital room?: None Help needed climbing 3-5 steps with a railing? : A Little 6 Click Score: 23    End of Session   Activity Tolerance: Patient tolerated treatment well Patient left: in chair;with call bell/phone within reach Nurse Communication: Mobility status PT Visit Diagnosis: Other abnormalities of gait and mobility (R26.89);Muscle weakness (generalized) (M62.81);Pain     Time: 1610-9604 PT Time Calculation (min) (ACUTE ONLY): 14 min  Charges:    $Gait Training: 8-22 mins PT General Charges $$ ACUTE PT VISIT: 1 Visit                     Hortencia Conradi, PTA  02/13/24, 11:24 AM

## 2024-02-13 NOTE — Plan of Care (Signed)

## 2024-02-14 DIAGNOSIS — K5652 Intestinal adhesions [bands] with complete obstruction: Secondary | ICD-10-CM | POA: Diagnosis not present

## 2024-02-14 LAB — CBC
HCT: 30.8 % — ABNORMAL LOW (ref 36.0–46.0)
Hemoglobin: 10 g/dL — ABNORMAL LOW (ref 12.0–15.0)
MCH: 30.5 pg (ref 26.0–34.0)
MCHC: 32.5 g/dL (ref 30.0–36.0)
MCV: 93.9 fL (ref 80.0–100.0)
Platelets: 175 10*3/uL (ref 150–400)
RBC: 3.28 MIL/uL — ABNORMAL LOW (ref 3.87–5.11)
RDW: 17.4 % — ABNORMAL HIGH (ref 11.5–15.5)
WBC: 4.6 10*3/uL (ref 4.0–10.5)
nRBC: 0 % (ref 0.0–0.2)

## 2024-02-14 LAB — BASIC METABOLIC PANEL WITH GFR
Anion gap: 5 (ref 5–15)
BUN: 15 mg/dL (ref 6–20)
CO2: 22 mmol/L (ref 22–32)
Calcium: 8 mg/dL — ABNORMAL LOW (ref 8.9–10.3)
Chloride: 109 mmol/L (ref 98–111)
Creatinine, Ser: 0.79 mg/dL (ref 0.44–1.00)
GFR, Estimated: >60 mL/min (ref >60)
Glucose, Bld: 91 mg/dL (ref 70–99)
Potassium: 4.3 mmol/L (ref 3.5–5.1)
Sodium: 136 mmol/L (ref 135–145)

## 2024-02-14 LAB — GLUCOSE, CAPILLARY
Glucose-Capillary: 107 mg/dL — ABNORMAL HIGH (ref 70–99)
Glucose-Capillary: 109 mg/dL — ABNORMAL HIGH (ref 70–99)
Glucose-Capillary: 88 mg/dL (ref 70–99)
Glucose-Capillary: 88 mg/dL (ref 70–99)

## 2024-02-14 MED ORDER — HYDROCODONE-ACETAMINOPHEN 7.5-325 MG PO TABS: 1.0000 | ORAL_TABLET | Freq: Four times a day (QID) | ORAL | 0 refills | Status: AC | PRN

## 2024-02-14 MED ORDER — AMOXICILLIN-POT CLAVULANATE 875-125 MG PO TABS: 1.0000 | ORAL_TABLET | Freq: Two times a day (BID) | ORAL | 0 refills | Status: AC

## 2024-02-14 MED ORDER — FLUCONAZOLE 100 MG PO TABS: 100.0000 mg | ORAL_TABLET | Freq: Every day | ORAL | 0 refills | Status: AC

## 2024-02-14 MED ORDER — PANTOPRAZOLE SODIUM 40 MG PO TBEC
40.0000 mg | DELAYED_RELEASE_TABLET | Freq: Every day | ORAL | Status: DC
  Administered 2024-02-14: 40 mg via ORAL
  Filled 2024-02-14: qty 1

## 2024-02-14 MED ORDER — HEPARIN SOD (PORK) LOCK FLUSH 100 UNIT/ML IV SOLN
500.0000 [IU] | Freq: Once | INTRAVENOUS | Status: AC
  Administered 2024-02-14: 500 [IU] via INTRAVENOUS
  Filled 2024-02-14: qty 5

## 2024-02-14 MED ORDER — PANTOPRAZOLE SODIUM 40 MG PO TBEC: 40.0000 mg | DELAYED_RELEASE_TABLET | Freq: Every day | ORAL | 0 refills | Status: AC

## 2024-02-14 NOTE — TOC Transition Note (Signed)
 Transition of Care [facility]) - Discharge Note   Patient Details  Name: Alexis Garcia MRN: [id number] Date of Birth: 05-05-1961  [facility]) CM/SW Contact:  Bing Quarry, RN Phone Number: 02/14/2024, 1:52 PM   Clinical Narrative:   02/14/24: Patient admitted from home where lives with son, and a daughter that comes in from [place] every week, Patient is discharging today to home with outpatient therapy via [facility] outpatient therapy services after [facility] spoke with patient after no agency to accept. Patient was agreeable to outpatient as has cancer treatments several times a week at [facility] as well.   Patient does also have a Wet to Dry abdominal dressing change daily that she feels she is able to to. Unit RN to instruct, give supplies to get started, and inform as to where to obtain more supplies.   Progress note/order form for outpatient therapy routed to [facility] facility after it was co-signed by provider.     Gabriel Cirri MSN RN CM  RN Case Manager [place]  Transitions of Care Direct Dial: [phone] (Weekends Only) [facility] Main Office Phone: [phone] [facility] Fax: [phone] [email or web address]   Final next level of care: OP Rehab Barriers to Discharge: Barriers Resolved (No home health agency to accept for services, patient agreeable to outpatient therapy and chose [facility]. Progress note/order form completed)   Patient Goals and CMS Choice     Choice offered to / list presented to : Patient      Discharge Placement                       Discharge Plan and Services Additional resources added to the After Visit Summary for                  DME Arranged: N/A DME Agency: NA       HH Arranged: NA HH Agency: NA        Social Drivers of Health (SDOH) Interventions SDOH Screenings   Food Insecurity: No Food Insecurity (02/08/2024)  Housing: Low Risk  (02/08/2024)  Transportation Needs: No Transportation Needs (02/08/2024)   Utilities: Not At Risk (02/08/2024)  Tobacco Use: Medium Risk (02/07/2024)     Readmission Risk Interventions     No data to display

## 2024-02-14 NOTE — Discharge Summary (Signed)
 Physician Discharge Summary  Alexis Garcia:[id number] DOB: [birth year] DOA: 02/07/2024  PCP: Armando Gang, FNP  Admit date: 02/07/2024 Discharge date: 02/14/2024  Admitted From: home  Disposition:  home w/ home health   Recommendations for Outpatient Follow-up:  Follow up with PCP in 1-2 weeks F/u w/ gen surg, Dr. Everlene Farrier, on 02/24/24 at 3pm F/u onco, Dr. Orlie Dakin, within 1 week   Home Health: yes Equipment/Devices:  Discharge Condition: stable  CODE STATUS: Diet recommendation: soft and advance as tolerated  Brief/Interim Summary: HPI was taken from Dr. 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  The patient reports nausea and vomiting since since Wednesday of this week has noticed that her umbilical hernia is unreducible with increasing abdominal pain when it is touched along with nausea and vomiting since then.  She denies any flatus or bowel movement since Wednesday.  The patient denies any chest pain fever chills or shortness of breath.   Case discussed with the emergency room physician who has already discussed this case with the surgeon on-call and plans to take the patient to the operating room from the ER and request admission under hospital medicine services primary.   The patient's daughter at the bedside [name] confirmed the history as provided by the patient.     Past medical records reviewed and summarized the patient's last oncology outpatient progress note 02/01/2024: cycle 12 of FOLFOX plus Avastin planned  Discharge Diagnoses:  Principal Problem:   Bowel obstruction (HCC) Active Problems:   AKI (acute kidney injury) (HCC)   Elevated troponin   HTN (hypertension)   DM2 (diabetes mellitus, type 2) (HCC)   History of COPD    Hypothyroidism   Elevated bilirubin   Uterine mass   Hyponatremia   Pulmonary nodules   Colon cancer (HCC)   Bowel perforation (HCC)   Incarcerated hernia   Acute abdomen   Malnutrition of moderate degree  SBO: w/ perforation due to strangulated ventral hernia. S/p laparotomy, small bowel defect was closed, no bowel resection was performed as per gen surg. Biopsy of small bowel resection shows adenocarcinoma. Continue on augmentin x 1 week. Previous on IV zosyn. Tissue culture from the OR growing e. coli. NG tube was d/c on 02/11/24.   Peritonitis: secondary to perforation. Continue on augmentin x 1 week    Hypokalemia: WNL today   Hypophosphatemia: resolved   AKI: resolved    Elevated troponin: likely secondary to demand ischemia. TTE LVEF 70 to 75%, no WMA, grade 1 diastolic dysfunction, no significant valvular abnormality.   DM2: well controlled. Continue on SSI w/ accuchecks    COPD: w.o exacerbation. Bronchodilators prn    HTN: continue on hydralazine   HLD: not on a statin currently    Hypothyroidism: continue on levothyroxine   Discharge Instructions  Discharge Instructions     Diet - low sodium heart healthy   Complete by: As directed    Diet Carb Modified   Complete by: As directed    Discharge instructions   Complete by: As directed    F/u w/ PCP in 1-2 weeks. F/u w/ onco within 1 week. F/u w/ gen surg, Dr. Everlene Farrier, on 02/24/24 at [facility]   Discharge wound care:   Complete by: As directed    Wound care  Daily  Comments: Change abd dressing daily w kerlix wrap  follow by ABD and tape on top ( wet/dry)   Increase activity slowly   Complete by: As directed       Allergies as of 02/14/2024   No Known Allergies      Medication List     TAKE these medications    albuterol 108 (90 Base) MCG/ACT inhaler Commonly known as: VENTOLIN HFA Inhale 2 puffs into the lungs every 6 (six) hours as needed (for exercise induced asthma).   ALIVE ONCE DAILY WOMENS  PO Take 2 tablets by mouth daily. Gummies   amoxicillin-clavulanate 875-125 MG tablet Commonly known as: AUGMENTIN Take 1 tablet by mouth every 12 (twelve) hours for 7 days.   atorvastatin 10 MG tablet Commonly known as: LIPITOR SMARTSIG:1 Tablet(s) By Mouth Every Evening   ezetimibe 10 MG tablet Commonly known as: ZETIA Take 10 mg by mouth daily.   fluconazole 100 MG tablet Commonly known as: Diflucan Take 1 tablet (100 mg total) by mouth daily for 2 days.   hydrALAZINE 10 MG tablet Commonly known as: APRESOLINE Take 10 mg by mouth 3 (three) times daily as needed.   HYDROcodone-acetaminophen 7.5-325 MG tablet Commonly known as: NORCO Take 1 tablet by mouth every 6 (six) hours as needed for up to 5 days for moderate pain (pain score 4-6) or severe pain (pain score 7-10).   levothyroxine 100 MCG tablet Commonly known as: SYNTHROID Take 100 mcg by mouth daily.   lidocaine-prilocaine cream Commonly known as: EMLA Apply to affected area once   metFORMIN 500 MG tablet Commonly known as: GLUCOPHAGE Take 500 mg by mouth 2 (two) times daily.   metoprolol tartrate 25 MG tablet Commonly known as: LOPRESSOR Take 25 mg by mouth 2 (two) times daily.   ondansetron 8 MG tablet Commonly known as: Zofran Take 1 tablet (8 mg total) by mouth every 8 (eight) hours as needed for nausea or vomiting. Start on the third day after chemotherapy.   pantoprazole 40 MG tablet Commonly known as: PROTONIX Take 1 tablet (40 mg total) by mouth daily. Start taking on: February 15, 2024   prochlorperazine 10 MG tablet Commonly known as: COMPAZINE Take 1 tablet (10 mg total) by mouth every 6 (six) hours as needed for nausea or vomiting.               Discharge Care Instructions  (From admission, onward)           Start     Ordered   02/14/24 0000  Discharge wound care:       Comments: Wound care  Daily      Comments: Change abd dressing daily w kerlix wrap  follow by ABD and tape on  top ( wet/dry)   02/14/24 1253            Follow-up Information     Leafy Ro, MD. Go on 02/24/2024.   Specialty: General Surgery Why: Go to appointment on 04/16 at 300 PM Contact information: [address] [place] [address] [phone]                No Known Allergies  Consultations: Gen surg    Procedures/Studies: DG ABD ACUTE 2+V W 1V CHEST Result Date: 02/11/2024 CLINICAL DATA:  Postoperative ileus. EXAM: DG ABDOMEN ACUTE WITH 1 VIEW CHEST COMPARISON:  February 09, 2024. FINDINGS: Nasogastric tube tip is seen in proximal stomach. Surgical drain is noted in the pelvis. Calcified uterine fibroid  is noted in the pelvis. No acute cardiopulmonary abnormality is seen. Mildly dilated small bowel loops are again noted most consistent with postoperative ileus. No definite colonic dilatation is noted. IMPRESSION: Mildly dilated small bowel loops are again noted most consistent with postoperative ileus. Electronically Signed   By: Lupita Raider M.D.   On: 02/11/2024 11:17   DG ABD ACUTE 2+V W 1V CHEST Result Date: 02/09/2024 CLINICAL DATA:  Ileus. EXAM: DG ABDOMEN ACUTE WITH 1 VIEW CHEST COMPARISON:  X-ray 02/08/2024 abdomen.  CT 02/07/2024 FINDINGS: Stable right IJ chest port normal cardiopericardial silhouette. No consolidation, pneumothorax or effusion. No edema. Enteric tube extending beneath the diaphragm. Overlapping cardiac leads. Enteric tube along the stomach. Surgical chain along the low pelvis. Few loops of mildly dilated loops of bowel in the mid abdomen. Few air-fluid levels on the upright view. These could be small bowel loops. Dystrophic calcification in the pelvis. IMPRESSION: Few dilated loops of bowel mid abdomen with some air-fluid levels. Again ileus is favored over obstruction with a history but recommend close follow-up. No obvious free air seen beneath the diaphragm. Enteric tube.  Pelvic surgical drain. Chest port Electronically Signed    By: Karen Kays M.D.   On: 02/09/2024 10:59   US RENAL Result Date: 02/08/2024 CLINICAL DATA:  Acute kidney injury. Status post laparotomy for repair incarcerated ventral hernia with associated small bowel obstruction, small-bowel perforation and peritonitis peer EXAM: RENAL / URINARY TRACT ULTRASOUND COMPLETE COMPARISON:  None Available. FINDINGS: Right Kidney: Renal measurements: 11.3 x 4.6 x 5.6 cm = volume: 150 mL. Echogenicity within normal limits. Mild prominence of renal pelvis without overt hydronephrosis. No renal lesions visualized. Simple cyst of the mid kidney measures up to 1.9 cm and has a benign appearance require no follow-up. Left Kidney: Renal measurements: 10.5 x 5.3 x 5.5 cm = volume: 160 mL. Echogenicity within normal limits. No mass or hydronephrosis visualized. Bladder: Not visualized due to surgical dressings from recent laparotomy. Other: None. IMPRESSION: 1. Mild prominence of the right renal pelvis without overt hydronephrosis. 2. 1.9 cm simple cyst of the mid right kidney requires no follow-up. 3. Bladder not visualized due to surgical dressings from recent laparotomy. Electronically Signed   By: Irish Lack M.D.   On: 02/08/2024 11:52   ECHOCARDIOGRAM COMPLETE Result Date: 02/08/2024    ECHOCARDIOGRAM REPORT   Patient Name:   NILANI HUGILL Date of Exam: 02/08/2024 Medical Rec #:  [id number]    Height:       66.0 in Accession #:    [id number]   Weight:       179.0 lb Date of Birth:  Apr 24, 1961    BSA:          1.908 m Patient Age:    62 years     BP:           129/59 mmHg Patient Gender: F            HR:           69 bpm. Exam Location:  [facility] Procedure: 2D Echo, Cardiac Doppler, Color Doppler, Strain Analysis and 3D Echo            (Both Spectral and Color Flow Doppler were utilized during            procedure). Indications:     Elevated Troponin  History:         Patient has no prior history of Echocardiogram examinations.  COPD, Signs/Symptoms:Murmur; Risk  Factors:Hypertension.  Sonographer:     Cristela Blue Referring Phys:  [id number] DIEGO F PABON Diagnosing Phys: Yvonne Kendall MD  Sonographer Comments: Global longitudinal strain was attempted. IMPRESSIONS  1. Left ventricular ejection fraction, by estimation, is 70 to 75%. The left ventricle has hyperdynamic function. The left ventricle has no regional wall motion abnormalities. There is moderate left ventricular hypertrophy. Left ventricular diastolic parameters are consistent with Grade I diastolic dysfunction (impaired relaxation). The average left ventricular global longitudinal strain is -15.0 %. The global longitudinal strain is abnormal.  2. Right ventricular systolic function is normal. The right ventricular size is normal.  3. The mitral valve is normal in structure. Trivial mitral valve regurgitation.  4. The aortic valve has an indeterminant number of cusps. There is mild thickening of the aortic valve. Aortic valve regurgitation is not visualized. Aortic valve sclerosis is present, with no evidence of aortic valve stenosis. FINDINGS  Left Ventricle: Left ventricular ejection fraction, by estimation, is 70 to 75%. The left ventricle has hyperdynamic function. The left ventricle has no regional wall motion abnormalities. The average left ventricular global longitudinal strain is -15.0  %. Strain was performed and the global longitudinal strain is abnormal. 3D ejection fraction reviewed and evaluated as part of the interpretation. Alternate measurement of EF is felt to be most reflective of LV function. The left ventricular internal cavity size was normal in size. There is moderate left ventricular hypertrophy. Left ventricular diastolic parameters are consistent with Grade I diastolic dysfunction (impaired relaxation). Right Ventricle: The right ventricular size is normal. No increase in right ventricular wall thickness. Right ventricular systolic function is normal. Left Atrium: Left atrial size was normal  in size. Right Atrium: Right atrial size was normal in size. Pericardium: The pericardium was not well visualized. Mitral Valve: The mitral valve is normal in structure. Trivial mitral valve regurgitation. MV peak gradient, 7.4 mmHg. The mean mitral valve gradient is 2.0 mmHg. Tricuspid Valve: The tricuspid valve is not well visualized. Tricuspid valve regurgitation is not demonstrated. Aortic Valve: The aortic valve has an indeterminant number of cusps. There is mild thickening of the aortic valve. Aortic valve regurgitation is not visualized. Aortic valve sclerosis is present, with no evidence of aortic valve stenosis. Aortic valve mean gradient measures 3.0 mmHg. Aortic valve peak gradient measures 5.3 mmHg. Aortic valve area, by VTI measures 2.98 cm. Pulmonic Valve: The pulmonic valve was not well visualized. Pulmonic valve regurgitation is not visualized. No evidence of pulmonic stenosis. Aorta: The aortic root is normal in size and structure. Pulmonary Artery: The pulmonary artery is not well seen. IAS/Shunts: The interatrial septum was not well visualized. Additional Comments: 3D was performed not requiring image post processing on an independent workstation and was abnormal.  LEFT VENTRICLE PLAX 2D LVIDd:         3.36 cm   Diastology LVIDs:         1.48 cm   LV e' medial:    6.09 cm/s LV PW:         1.60 cm   LV E/e' medial:  10.0 LV IVS:        1.63 cm   LV e' lateral:   8.49 cm/s LVOT diam:     2.00 cm   LV E/e' lateral: 7.2 LV SV:         66 LV SV Index:   34        2D Longitudinal Strain LVOT Area:  3.14 cm  2D Strain GLS Avg:     -15.0 %                           3D Volume EF:                          3D EF:        44 % RIGHT VENTRICLE RV Basal diam:  3.20 cm RV Mid diam:    2.40 cm LEFT ATRIUM           Index        RIGHT ATRIUM           Index LA diam:      3.80 cm 1.99 cm/m   RA Area:     11.90 cm LA Vol (A2C): 15.3 ml 8.02 ml/m   RA Volume:   23.60 ml  12.37 ml/m LA Vol (A4C): 25.4 ml 13.31  ml/m  AORTIC VALVE AV Area (Vmax):    2.92 cm AV Area (Vmean):   3.12 cm AV Area (VTI):     2.98 cm AV Vmax:           115.00 cm/s AV Vmean:          78.300 cm/s AV VTI:            0.220 m AV Peak Grad:      5.3 mmHg AV Mean Grad:      3.0 mmHg LVOT Vmax:         107.00 cm/s LVOT Vmean:        77.700 cm/s LVOT VTI:          0.209 m LVOT/AV VTI ratio: 0.95  AORTA Ao Root diam: 3.00 cm MITRAL VALVE MV Area (PHT): 2.17 cm     SHUNTS MV Area VTI:   2.23 cm     Systemic VTI:  0.21 m MV Peak grad:  7.4 mmHg     Systemic Diam: 2.00 cm MV Mean grad:  2.0 mmHg MV Vmax:       1.36 m/s MV Vmean:      70.1 cm/s MV Decel Time: 350 msec MV E velocity: 60.80 cm/s MV A velocity: 117.00 cm/s MV E/A ratio:  0.52 Cristal Deer End MD Electronically signed by Yvonne Kendall MD Signature Date/Time: 02/08/2024/10:43:24 AM    Final    [place] EKG SITE RITE Result Date: 02/08/2024 If Site Rite image not attached, placement could not be confirmed due to current cardiac rhythm.  DG Abd Portable 1V Result Date: 02/08/2024 CLINICAL DATA:  Ileus falling gastrointestinal surgery EXAM: PORTABLE ABDOMEN - 1 VIEW COMPARISON:  CT abdomen pelvis 02/07/2024 FINDINGS: Marked gaseous dilation of the small bowel in the central abdomen measuring up to 6.0 cm. Enteric tube tip and side-port in the stomach. Pelvic catheters or drains. IMPRESSION: Marked gaseous dilation of the small bowel in the central abdomen measuring up to 6.0 cm. Findings may be due to ileus or obstruction. Electronically Signed   By: Minerva Fester M.D.   On: 02/08/2024 02:19   CT ABDOMEN PELVIS W CONTRAST Result Date: 02/07/2024 CLINICAL DATA:  GI upset history of colon liver and lung cancer EXAM: CT ABDOMEN AND PELVIS WITH CONTRAST TECHNIQUE: Multidetector CT imaging of the abdomen and pelvis was performed using the standard protocol following bolus administration of intravenous contrast. RADIATION DOSE REDUCTION: This exam was performed according to the departmental  dose-optimization program which  includes automated exposure control, adjustment of the mA and/or kV according to patient size and/or use of iterative reconstruction technique. CONTRAST:  80mL OMNIPAQUE IOHEXOL 300 MG/ML  SOLN COMPARISON:  CT 11/30/2023, 08/05/2023 FINDINGS: Lower chest: Lung bases demonstrate scattered pulmonary nodules consistent with metastatic disease. Slight decreased size of left lower lobe index lesion on series 8, image 8, measures 9 x 9 mm, previously 12 x 12 mm. However there appear to be several new small pulmonary nodules, for example 8 mm left lower lobe pulmonary nodule on series 4, image 9, central right lung base pulmonary nodule measuring 8 mm on series 4, image 9, and punctate 4 mm right anterior lung base nodule on series 4, image 7. Faint 3 mm right posterior lower lobe pulmonary nodule on series 4, image 10. Hepatobiliary: Hepatic metastatic disease again noted. Interval decrease in size of previously measured lesions, for example caudate lesion measures 38 x 26 mm on series 2, image 23, previously 48 by 35 mm. Right hepatic lobe lesion measures 25 x 25 mm on series 2, image 23, previously 33 x 27 mm. However interim finding of multiple subcentimeter hypodense liver lesions not clearly seen on the prior exam. No biliary dilatation. No calcified gallstones. Pancreas: Unremarkable. No pancreatic ductal dilatation or surrounding inflammatory changes. Spleen: Normal in size without focal abnormality. Adrenals/Urinary Tract: Adrenal glands are normal. Kidneys show no hydronephrosis. Bladder is decompressed Stomach/Bowel: Stomach decompressed. Interval multiple fluid-filled dilated loops of small bowel measuring up to 5.7 cm consistent with bowel obstruction. Point of transition is related to moderate complex ventral hernia which contains mesenteric fat, fluid and slightly thickened small bowel loop. Small bowel distal to this is completely decompressed. Cecal mass is again visualized  as prominent wall thickening on coronal series 5, image 56. When measured in a similar fashion compared with the prior exam, measures about 5 x 4.2 cm on series 2, image 56, previously 6.6 x 5 cm. Slightly thickened appearance of the appendix but no definite inflammatory change. Vascular/Lymphatic: Mild aortic atherosclerosis. No aneurysm. No enlarging lymph nodes. Reproductive: Calcified uterine fibroid. Posterior pelvic mass that is probably associated with the right ovary/adnexa is slightly diminished as well 6.7 x 4.6 cm on series 2, image 78, previously 7.2 x 5.5 cm when measured in a similar fashion. Some mass effect on the adjacent rectosigmoid colon. Other: Trace ascites in the pelvis. Small volume free fluid within the hernia. Small amount of free gas within the hernia sac, series 2, image 68 and series 2, image 67. Musculoskeletal: Stable osseous structures without acute1 finding. IMPRESSION: 1. Interval development of small-bowel obstruction with point of transition related to moderate complex ventral hernia which contains mesenteric fat, fluid and slightly thickened small bowel loop. Small amount of free gas within the hernia sac, concern for incarcerated hernia with possible bowel perforation. Small volume free fluid within the hernia. 2. Interval decrease in size of previously measured hepatic metastatic lesions. However interim finding of multiple subcentimeter hypodense liver lesions/suspected metastatic lesions not clearly seen on the prior exam. 3. Slight decreased size of cecal mass. Slightly decreased size of posterior pelvic mass that is probably associated with the right ovary/adnexa. 4. Slight decreased size of index left lower lobe pulmonary nodule, however there appear to be several new small pulmonary nodules at the lung bases concerning for metastatic disease. 5. Aortic atherosclerosis. Critical Value/emergent results were called by telephone at the time of interpretation on 02/07/2024 at  9:05 pm to provider Bing Neighbors , who verbally  acknowledged these results. Electronically Signed   By: Jasmine Pang M.D.   On: 02/07/2024 21:05   (Echo, Carotid, EGD, Colonoscopy, ERCP)    Subjective: Pt c/o fatigue   Discharge Exam: Vitals:   02/14/24 0801 02/14/24 1148  BP: 130/80 (!) 148/96  Pulse: 81 77  Resp: 18 14  Temp: 99 F (37.2 C) 98.6 F (37 C)  SpO2: 99% 100%   Vitals:   02/14/24 0420 02/14/24 0500 02/14/24 0801 02/14/24 1148  BP: (!) 153/85  130/80 (!) 148/96  Pulse: 73  81 77  Resp: 17  18 14   Temp:   99 F (37.2 C) 98.6 F (37 C)  TempSrc:   Oral Oral  SpO2: 97%  99% 100%  Weight:  75.9 kg    Height:        General: Pt is alert, awake, not in acute distress Cardiovascular: S1/S2 +, no rubs, no gallops Respiratory: CTA bilaterally, no wheezing, no rhonchi Abdominal: Soft, NT,  bowel sounds + Extremities: no cyanosis    The results of significant diagnostics from this hospitalization (including imaging, microbiology, ancillary and laboratory) are listed below for reference.     Microbiology: Recent Results (from the past 240 hours)  Resp panel by RT-PCR (RSV, Flu A&B, Covid) Anterior Nasal Swab     Status: None   Collection Time: 02/07/24  8:06 PM   Specimen: Anterior Nasal Swab  Result Value Ref Range Status   SARS Coronavirus 2 by RT PCR NEGATIVE NEGATIVE Final    Comment: (NOTE) SARS-CoV-2 target nucleic acids are NOT DETECTED.  The SARS-CoV-2 RNA is generally detectable in upper respiratory specimens during the acute phase of infection. The lowest concentration of SARS-CoV-2 viral copies this assay can detect is 138 copies/mL. A negative result does not preclude SARS-Cov-2 infection and should not be used as the sole basis for treatment or other patient management decisions. A negative result may occur with  improper specimen collection/handling, submission of specimen other than nasopharyngeal swab, presence of viral mutation(s) within  the areas targeted by this assay, and inadequate number of viral copies(<138 copies/mL). A negative result must be combined with clinical observations, patient history, and epidemiological information. The expected result is Negative.  Fact Sheet for Patients:  BloggerCourse.com  Fact Sheet for Healthcare Providers:  SeriousBroker.it  This test is no t yet approved or cleared by the [place] FDA and  has been authorized for detection and/or diagnosis of SARS-CoV-2 by FDA under an Emergency Use Authorization (EUA). This EUA will remain  in effect (meaning this test can be used) for the duration of the COVID-19 declaration under Section 564(b)(1) of the Act, 21 U.S.C.section 360bbb-3(b)(1), unless the authorization is terminated  or revoked sooner.       Influenza A by PCR NEGATIVE NEGATIVE Final   Influenza B by PCR NEGATIVE NEGATIVE Final    Comment: (NOTE) The Xpert Xpress SARS-CoV-2/FLU/RSV plus assay is intended as an aid in the diagnosis of influenza from Nasopharyngeal swab specimens and should not be used as a sole basis for treatment. Nasal washings and aspirates are unacceptable for Xpert Xpress SARS-CoV-2/FLU/RSV testing.  Fact Sheet for Patients: BloggerCourse.com  Fact Sheet for Healthcare Providers: SeriousBroker.it  This test is not yet approved or cleared by the [place] FDA and has been authorized for detection and/or diagnosis of SARS-CoV-2 by FDA under an Emergency Use Authorization (EUA). This EUA will remain in effect (meaning this test can be used) for the duration of the COVID-19 declaration  under Section 564(b)(1) of the Act, 21 U.S.C. section 360bbb-3(b)(1), unless the authorization is terminated or revoked.     Resp Syncytial Virus by PCR NEGATIVE NEGATIVE Final    Comment: (NOTE) Fact Sheet for  Patients: BloggerCourse.com  Fact Sheet for Healthcare Providers: SeriousBroker.it  This test is not yet approved or cleared by the [place] FDA and has been authorized for detection and/or diagnosis of SARS-CoV-2 by FDA under an Emergency Use Authorization (EUA). This EUA will remain in effect (meaning this test can be used) for the duration of the COVID-19 declaration under Section 564(b)(1) of the Act, 21 U.S.C. section 360bbb-3(b)(1), unless the authorization is terminated or revoked.  Performed at [facility], [address] [place], [place] [address]   Aerobic/Anaerobic Culture w Gram Stain (surgical/deep wound)     Status: None   Collection Time: 02/07/24 11:47 PM   Specimen: Path fluid; Body Fluid  Result Value Ref Range Status   Specimen Description FLUID ABDOMEN  Final   Special Requests ID A SWAB  Final   Gram Stain   Final    FEW WBC PRESENT, PREDOMINANTLY MONONUCLEAR MODERATE GRAM NEGATIVE RODS FEW GRAM POSITIVE COCCI IN PAIRS    Culture   Final    MODERATE ESCHERICHIA COLI MODERATE BACTEROIDES THETAIOTAOMICRON BETA LACTAMASE POSITIVE Performed at [facility], [address], [place], [place] [address]    Report Status 02/13/2024 FINAL  Final   Organism ID, Bacteria ESCHERICHIA COLI  Final      Susceptibility   Escherichia coli - MIC*    AMPICILLIN 8 SENSITIVE Sensitive     CEFEPIME <=0.12 SENSITIVE Sensitive     CEFTAZIDIME <=1 SENSITIVE Sensitive     CEFTRIAXONE <=0.25 SENSITIVE Sensitive     CIPROFLOXACIN >=4 RESISTANT Resistant     GENTAMICIN <=1 SENSITIVE Sensitive     IMIPENEM <=0.25 SENSITIVE Sensitive     TRIMETH/SULFA <=20 SENSITIVE Sensitive     AMPICILLIN/SULBACTAM 4 SENSITIVE Sensitive     PIP/TAZO 16 SENSITIVE Sensitive ug/mL    * MODERATE ESCHERICHIA COLI  Culture, blood (Routine X 2) w Reflex to ID Panel     Status: None   Collection Time: 02/08/24  2:11 AM    Specimen: BLOOD RIGHT HAND  Result Value Ref Range Status   Specimen Description BLOOD RIGHT HAND  Final   Special Requests   Final    BOTTLES DRAWN AEROBIC AND ANAEROBIC Blood Culture adequate volume   Culture   Final    NO GROWTH 5 DAYS Performed at [facility], [address] [place], [place] [address]    Report Status 02/13/2024 FINAL  Final  Culture, blood (Routine X 2) w Reflex to ID Panel     Status: None   Collection Time: 02/08/24  2:19 AM   Specimen: BLOOD LEFT HAND  Result Value Ref Range Status   Specimen Description BLOOD LEFT HAND  Final   Special Requests   Final    BOTTLES DRAWN AEROBIC AND ANAEROBIC Blood Culture adequate volume   Culture   Final    NO GROWTH 5 DAYS Performed at [facility], [address] [place], [place] [address]    Report Status 02/13/2024 FINAL  Final     Labs: BNP (last 3 results) No results for input(s): "BNP" in the last 8760 hours. Basic Metabolic Panel: Recent Labs  Lab 02/09/24 0445 02/09/24 1813 02/10/24 0145 02/11/24 0515 02/12/24 0517 02/13/24 0456 02/14/24 0514  NA 135   < >  134* 140 138 137 136  K 3.0*   < > 3.3* 4.1 5.0 4.8 4.3  CL 101   < > 101 106 107 110 109  CO2 27   < > 24 25 25 22 22   GLUCOSE 121*   < > 125* 158* 128* 122* 91  BUN 66*   < > 46* 21* 18 19 15   CREATININE 1.31*   < > 0.88 0.73 0.85 0.78 0.79  CALCIUM 7.8*   < > 7.5* 8.2* 8.4* 8.4* 8.0*  MG 2.1  --  2.3 1.8 1.9 1.9  --   PHOS 2.2*  --  2.4* 2.7 3.2 3.4  --    < > = values in this interval not displayed.   Liver Function Tests: Recent Labs  Lab 02/07/24 1807 02/08/24 0211 02/11/24 0515  AST 32 26 28  ALT 26 20 12   ALKPHOS 71 53 50  BILITOT 1.3* 1.1 0.5  PROT 8.8* 6.6 6.7  ALBUMIN 3.8 2.8* 2.6*   Recent Labs  Lab 02/07/24 1807  LIPASE 29   No results for input(s): "AMMONIA" in the last 168 hours. CBC: Recent Labs  Lab 02/10/24 0145 02/11/24 0515 02/12/24 0517 02/13/24 0456 02/14/24 0514  WBC 5.9 5.5  4.6 4.7 4.6  HGB 9.5* 10.5* 10.8* 10.3* 10.0*  HCT 28.3* 31.9* 33.1* 31.9* 30.8*  MCV 91.9 93.3 93.2 94.1 93.9  PLT 130* 159 150 153 175   Cardiac Enzymes: No results for input(s): "CKTOTAL", "CKMB", "CKMBINDEX", "TROPONINI" in the last 168 hours. BNP: Invalid input(s): "POCBNP" CBG: Recent Labs  Lab 02/13/24 1947 02/14/24 0007 02/14/24 0424 02/14/24 0804 02/14/24 1148  GLUCAP 124* 88 88 109* 107*   D-Dimer No results for input(s): "DDIMER" in the last 72 hours. Hgb A1c No results for input(s): "HGBA1C" in the last 72 hours. Lipid Profile No results for input(s): "CHOL", "HDL", "LDLCALC", "TRIG", "CHOLHDL", "LDLDIRECT" in the last 72 hours. Thyroid function studies No results for input(s): "TSH", "T4TOTAL", "T3FREE", "THYROIDAB" in the last 72 hours.  Invalid input(s): "FREET3" Anemia work up No results for input(s): "VITAMINB12", "FOLATE", "FERRITIN", "TIBC", "IRON", "RETICCTPCT" in the last 72 hours. Urinalysis    Component Value Date/Time   COLORURINE YELLOW (A) 02/08/2024 0230   APPEARANCEUR HAZY (A) 02/08/2024 0230   LABSPEC >1.046 (H) 02/08/2024 0230   PHURINE 5.0 02/08/2024 0230   GLUCOSEU NEGATIVE 02/08/2024 0230   HGBUR NEGATIVE 02/08/2024 0230   BILIRUBINUR NEGATIVE 02/08/2024 0230   KETONESUR NEGATIVE 02/08/2024 0230   PROTEINUR NEGATIVE 02/08/2024 0230   NITRITE NEGATIVE 02/08/2024 0230   LEUKOCYTESUR NEGATIVE 02/08/2024 0230   Sepsis Labs Recent Labs  Lab 02/11/24 0515 02/12/24 0517 02/13/24 0456 02/14/24 0514  WBC 5.5 4.6 4.7 4.6   Microbiology Recent Results (from the past 240 hours)  Resp panel by RT-PCR (RSV, Flu A&B, Covid) Anterior Nasal Swab     Status: None   Collection Time: 02/07/24  8:06 PM   Specimen: Anterior Nasal Swab  Result Value Ref Range Status   SARS Coronavirus 2 by RT PCR NEGATIVE NEGATIVE Final    Comment: (NOTE) SARS-CoV-2 target nucleic acids are NOT DETECTED.  The SARS-CoV-2 RNA is generally detectable in upper  respiratory specimens during the acute phase of infection. The lowest concentration of SARS-CoV-2 viral copies this assay can detect is 138 copies/mL. A negative result does not preclude SARS-Cov-2 infection and should not be used as the sole basis for treatment or other patient management decisions. A negative result may occur with  improper specimen collection/handling, submission of specimen other than nasopharyngeal swab, presence of viral mutation(s) within the areas targeted by this assay, and inadequate number of viral copies(<138 copies/mL). A negative result must be combined with clinical observations, patient history, and epidemiological information. The expected result is Negative.  Fact Sheet for Patients:  BloggerCourse.com  Fact Sheet for Healthcare Providers:  SeriousBroker.it  This test is no t yet approved or cleared by the [place] FDA and  has been authorized for detection and/or diagnosis of SARS-CoV-2 by FDA under an Emergency Use Authorization (EUA). This EUA will remain  in effect (meaning this test can be used) for the duration of the COVID-19 declaration under Section 564(b)(1) of the Act, 21 U.S.C.section 360bbb-3(b)(1), unless the authorization is terminated  or revoked sooner.       Influenza A by PCR NEGATIVE NEGATIVE Final   Influenza B by PCR NEGATIVE NEGATIVE Final    Comment: (NOTE) The Xpert Xpress SARS-CoV-2/FLU/RSV plus assay is intended as an aid in the diagnosis of influenza from Nasopharyngeal swab specimens and should not be used as a sole basis for treatment. Nasal washings and aspirates are unacceptable for Xpert Xpress SARS-CoV-2/FLU/RSV testing.  Fact Sheet for Patients: BloggerCourse.com  Fact Sheet for Healthcare Providers: SeriousBroker.it  This test is not yet approved or cleared by the [place] FDA and has been  authorized for detection and/or diagnosis of SARS-CoV-2 by FDA under an Emergency Use Authorization (EUA). This EUA will remain in effect (meaning this test can be used) for the duration of the COVID-19 declaration under Section 564(b)(1) of the Act, 21 U.S.C. section 360bbb-3(b)(1), unless the authorization is terminated or revoked.     Resp Syncytial Virus by PCR NEGATIVE NEGATIVE Final    Comment: (NOTE) Fact Sheet for Patients: BloggerCourse.com  Fact Sheet for Healthcare Providers: SeriousBroker.it  This test is not yet approved or cleared by the [place] FDA and has been authorized for detection and/or diagnosis of SARS-CoV-2 by FDA under an Emergency Use Authorization (EUA). This EUA will remain in effect (meaning this test can be used) for the duration of the COVID-19 declaration under Section 564(b)(1) of the Act, 21 U.S.C. section 360bbb-3(b)(1), unless the authorization is terminated or revoked.  Performed at [facility], [address] [place], [place] [address]   Aerobic/Anaerobic Culture w Gram Stain (surgical/deep wound)     Status: None   Collection Time: 02/07/24 11:47 PM   Specimen: Path fluid; Body Fluid  Result Value Ref Range Status   Specimen Description FLUID ABDOMEN  Final   Special Requests ID A SWAB  Final   Gram Stain   Final    FEW WBC PRESENT, PREDOMINANTLY MONONUCLEAR MODERATE GRAM NEGATIVE RODS FEW GRAM POSITIVE COCCI IN PAIRS    Culture   Final    MODERATE ESCHERICHIA COLI MODERATE BACTEROIDES THETAIOTAOMICRON BETA LACTAMASE POSITIVE Performed at [facility], [address] [place], [place] [address]    Report Status 02/13/2024 FINAL  Final   Organism ID, Bacteria ESCHERICHIA COLI  Final      Susceptibility   Escherichia coli - MIC*    AMPICILLIN 8 SENSITIVE Sensitive     CEFEPIME <=0.12 SENSITIVE Sensitive     CEFTAZIDIME <=1 SENSITIVE Sensitive     CEFTRIAXONE  <=0.25 SENSITIVE Sensitive     CIPROFLOXACIN >=4 RESISTANT Resistant     GENTAMICIN <=1 SENSITIVE Sensitive     IMIPENEM <=0.25 SENSITIVE Sensitive     TRIMETH/SULFA <=20 SENSITIVE Sensitive  AMPICILLIN/SULBACTAM 4 SENSITIVE Sensitive     PIP/TAZO 16 SENSITIVE Sensitive ug/mL    * MODERATE ESCHERICHIA COLI  Culture, blood (Routine X 2) w Reflex to ID Panel     Status: None   Collection Time: 02/08/24  2:11 AM   Specimen: BLOOD RIGHT HAND  Result Value Ref Range Status   Specimen Description BLOOD RIGHT HAND  Final   Special Requests   Final    BOTTLES DRAWN AEROBIC AND ANAEROBIC Blood Culture adequate volume   Culture   Final    NO GROWTH 5 DAYS Performed at [facility], [address] [place], [place] [address]    Report Status 02/13/2024 FINAL  Final  Culture, blood (Routine X 2) w Reflex to ID Panel     Status: None   Collection Time: 02/08/24  2:19 AM   Specimen: BLOOD LEFT HAND  Result Value Ref Range Status   Specimen Description BLOOD LEFT HAND  Final   Special Requests   Final    BOTTLES DRAWN AEROBIC AND ANAEROBIC Blood Culture adequate volume   Culture   Final    NO GROWTH 5 DAYS Performed at [facility], [address] [place], [place] [address]    Report Status 02/13/2024 FINAL  Final     Time coordinating discharge: Over 30 minutes  SIGNED:   Charise Killian, MD  [facility] 02/14/2024, 12:53 PM Pager   If 7PM-7AM, please contact night-coverage [email or web address]

## 2024-02-14 NOTE — Plan of Care (Signed)
  Problem: Education: Goal: Ability to describe self-care measures that may prevent or decrease complications (Diabetes Survival Skills Education) will improve Outcome: Progressing   Problem: Coping: Goal: Ability to adjust to condition or change in health will improve Outcome: Progressing   Problem: Fluid Volume: Goal: Ability to maintain a balanced intake and output will improve Outcome: Progressing   Problem: Health Behavior/Discharge Planning: Goal: Ability to identify and utilize available resources and services will improve Outcome: Progressing   Problem: Nutritional: Goal: Maintenance of adequate nutrition will improve Outcome: Progressing

## 2024-02-14 NOTE — Progress Notes (Signed)
 Occupational Therapy * Physical Therapy * Speech Therapy  DATE 02/14/24 PATIENT NAME: Alexis Garcia PATIENT MRN: [id number]  DIAGNOSIS/DIAGNOSIS CODE: K56.609 DATE OF DISCHARGE: 02/14/24  PRIMARY CARE PHYSICIAN:  Armando Gang, FNP     General - Family Medicine    [phone]   Patient chooses outpatient therapy services at:   [facility],  [address] [place], [place] [address]  Dear Providers:    I certify that I have examined this patient and that occupational/physical/speech therapy is necessary on an outpatient basis.    The patient has expressed interest in completing their recommended course of therapy at your location.  Once a formal order from the patient's primary care physician has been obtained, please contact him/her to schedule an appointment for evaluation at your earliest convenience.  [ X]  Physical Therapy Evaluate and Treat  [  ]  Occupational Therapy Evaluate and Treat  [  ]  Speech Therapy Evaluate and Treat  The patient's primary care physician (listed above) must furnish and be responsible for a formal order such that the recommended services may be furnished while under the primary physician's care, and that the plan of care will be established and reviewed every 30 days (or more often if condition necessitates).

## 2024-02-15 ENCOUNTER — Inpatient Hospital Stay: Admitting: Oncology

## 2024-02-15 ENCOUNTER — Inpatient Hospital Stay

## 2024-02-16 ENCOUNTER — Other Ambulatory Visit: Payer: Self-pay

## 2024-02-17 ENCOUNTER — Encounter

## 2024-02-24 ENCOUNTER — Ambulatory Visit: Admitting: Surgery

## 2024-02-24 ENCOUNTER — Encounter: Payer: Self-pay | Admitting: Surgery

## 2024-02-24 VITALS — BP 137/81 | HR 59 | Temp 97.7°F | Ht 66.5 in | Wt 162.4 lb

## 2024-02-24 DIAGNOSIS — Z09 Encounter for follow-up examination after completed treatment for conditions other than malignant neoplasm: Secondary | ICD-10-CM

## 2024-02-24 DIAGNOSIS — C189 Malignant neoplasm of colon, unspecified: Secondary | ICD-10-CM

## 2024-02-24 DIAGNOSIS — Z08 Encounter for follow-up examination after completed treatment for malignant neoplasm: Secondary | ICD-10-CM

## 2024-02-24 NOTE — Patient Instructions (Addendum)
 Continue to change dressing daily and eating a healthy diet. I placed a referral to palliative care, Josh Borders. They will call you for an appointment   Please call the office if you have any questions or concerns    Wound Care, Adult Taking care of your wound properly can help to prevent pain, infection, and scarring. It can also help your wound heal more quickly. Follow instructions from your health care provider about how to care for your wound. Supplies needed: Soap and water. Wound cleanser, saline, or germ-free (sterile) water. Gauze. If needed, a clean bandage (dressing) or other type of wound dressing material to cover or place in the wound. Follow your health care provider's instructions about what dressing supplies to use. Cream or topical ointment to apply to the wound, if told by your health care provider. How to care for your wound Cleaning the wound Ask your health care provider how to clean the wound. This may include: Using mild soap and water, a wound cleanser, saline, or sterile water. Using a clean gauze to pat the wound dry after cleaning it. Do not rub or scrub the wound. Dressing care Wash your hands with soap and water for at least 20 seconds before and after you change the dressing. If soap and water are not available, use hand sanitizer. Change your dressing as told by your health care provider. This may include: Cleaning or rinsing out (irrigating) the wound. Application of cream or topical ointment, if told by your health care provider. Placing a dressing over the wound or in the wound (packing). Covering the wound with an outer dressing. Leave stitches (sutures), staples, skin glue, or adhesive strips in place. These skin closures may need to stay in place for 2 weeks or longer. If adhesive strip edges start to loosen and curl up, you may trim the loose edges. Do not remove adhesive strips completely unless your health care provider tells you to do that. Ask  your health care provider when you can leave the wound uncovered. Checking for infection Check your wound area every day for signs of infection. Check for: More redness, swelling, or pain. Fluid or blood. Warmth. Pus or a bad smell.  Follow these instructions at home Medicines If you were prescribed an antibiotic medicine, cream, or ointment, take or apply it as told by your health care provider. Do not stop using the antibiotic even if your condition improves. If you were prescribed pain medicine, take it 30 minutes before you do any wound care or as told by your health care provider. Take over-the-counter and prescription medicines only as told by your health care provider. Eating and drinking Eat a diet that includes protein, vitamin A, vitamin C, and other nutrient-rich foods to help the wound heal. Foods rich in protein include meat, fish, eggs, dairy, beans, and nuts. Foods rich in vitamin A include carrots and dark green, leafy vegetables. Foods rich in vitamin C include citrus fruits, tomatoes, broccoli, and peppers. Drink enough fluid to keep your urine pale yellow. General instructions Do not take baths, swim, or use a hot tub until your health care provider approves. Ask your health care provider if you may take showers. You may only be allowed to take sponge baths. Do not scratch or pick at the wound. Keep it covered as told by your health care provider. Return to your normal activities as told by your health care provider. Ask your health care provider what activities are safe for you. Protect your  wound from the sun when you are outside for the first 6 months, or for as long as told by your health care provider. Cover up the scar area or apply sunscreen that has an SPF of at least 30. Do not use any products that contain nicotine or tobacco. These products include cigarettes, chewing tobacco, and vaping devices, such as e-cigarettes. If you need help quitting, ask your health  care provider. Keep all follow-up visits. This is important. Contact a health care provider if: You received a tetanus shot and you have swelling, severe pain, redness, or bleeding at the injection site. Your pain is not controlled with medicine. You have any of these signs of infection: More redness, swelling, or pain around the wound. Fluid or blood coming from the wound. Warmth coming from the wound. A fever or chills. You are nauseous or you vomit. You are dizzy. You have a new rash or hardness around the wound. Get help right away if: You have a red streak of skin near the area around your wound. Pus or a bad smell coming from the wound. Your wound has been closed with staples, sutures, skin glue, or adhesive strips and it begins to open up and separate. Your wound is bleeding, and the bleeding does not stop with gentle pressure. These symptoms may represent a serious problem that is an emergency. Do not wait to see if the symptoms will go away. Get medical help right away. Call your local emergency services (911 in the [place] Do not drive yourself to the hospital. Summary Always wash your hands with soap and water for at least 20 seconds before and after changing your dressing. Change your dressing as told by your health care provider. To help with healing, eat foods that are rich in protein, vitamin A, vitamin C, and other nutrients. Check your wound every day for signs of infection. Contact your health care provider if you think that your wound is infected. This information is not intended to replace advice given to you by your health care provider. Make sure you discuss any questions you have with your health care provider. Document Revised: 03/05/2021 Document Reviewed: 03/05/2021 Elsevier Patient Education  2024 [employer].

## 2024-02-24 NOTE — Progress Notes (Signed)
 Alexis Garcia is 2 1/2 weeks out from SB resection and hernia repair. She has metastatic cancer that has not responded to chemo She has open wound w some brown drainage Tolerating Po, no fevers or chills , + BM  PE NAD Abd: soft, open wound, fascia is intact but w fibrinous material around fascia, I debrided some , I do think this is partly some necrotic tissue associated with hernia repair, this is not unexpected and I would not do much about it other than wound care. Wound was packed. No fistula  A/p Doing ok but w progressive metastatic colon cancer  I will do referral to palliative  Path d/w pt she does have cancer even in the SB segment that we resected Continue wound care F/U 3 months

## 2024-03-01 ENCOUNTER — Other Ambulatory Visit: Payer: Self-pay | Admitting: *Deleted

## 2024-03-01 DIAGNOSIS — E876 Hypokalemia: Secondary | ICD-10-CM

## 2024-03-01 DIAGNOSIS — D649 Anemia, unspecified: Secondary | ICD-10-CM

## 2024-03-01 DIAGNOSIS — C182 Malignant neoplasm of ascending colon: Secondary | ICD-10-CM

## 2024-03-02 ENCOUNTER — Inpatient Hospital Stay: Attending: Oncology

## 2024-03-02 ENCOUNTER — Inpatient Hospital Stay (HOSPITAL_BASED_OUTPATIENT_CLINIC_OR_DEPARTMENT_OTHER): Admitting: Oncology

## 2024-03-02 ENCOUNTER — Encounter: Payer: Self-pay | Admitting: Oncology

## 2024-03-02 ENCOUNTER — Inpatient Hospital Stay (HOSPITAL_BASED_OUTPATIENT_CLINIC_OR_DEPARTMENT_OTHER): Admitting: Hospice and Palliative Medicine

## 2024-03-02 VITALS — BP 152/78 | HR 58 | Temp 98.8°F | Resp 16 | Ht 66.5 in | Wt 167.0 lb

## 2024-03-02 DIAGNOSIS — Z515 Encounter for palliative care: Secondary | ICD-10-CM | POA: Diagnosis not present

## 2024-03-02 DIAGNOSIS — I10 Essential (primary) hypertension: Secondary | ICD-10-CM | POA: Diagnosis not present

## 2024-03-02 DIAGNOSIS — Z801 Family history of malignant neoplasm of trachea, bronchus and lung: Secondary | ICD-10-CM | POA: Diagnosis not present

## 2024-03-02 DIAGNOSIS — C182 Malignant neoplasm of ascending colon: Secondary | ICD-10-CM | POA: Insufficient documentation

## 2024-03-02 DIAGNOSIS — K56699 Other intestinal obstruction unspecified as to partial versus complete obstruction: Secondary | ICD-10-CM | POA: Diagnosis not present

## 2024-03-02 DIAGNOSIS — G4700 Insomnia, unspecified: Secondary | ICD-10-CM | POA: Diagnosis not present

## 2024-03-02 DIAGNOSIS — D259 Leiomyoma of uterus, unspecified: Secondary | ICD-10-CM | POA: Diagnosis not present

## 2024-03-02 DIAGNOSIS — D72819 Decreased white blood cell count, unspecified: Secondary | ICD-10-CM | POA: Diagnosis not present

## 2024-03-02 DIAGNOSIS — E876 Hypokalemia: Secondary | ICD-10-CM | POA: Diagnosis not present

## 2024-03-02 DIAGNOSIS — C7800 Secondary malignant neoplasm of unspecified lung: Secondary | ICD-10-CM | POA: Diagnosis not present

## 2024-03-02 DIAGNOSIS — K436 Other and unspecified ventral hernia with obstruction, without gangrene: Secondary | ICD-10-CM | POA: Diagnosis not present

## 2024-03-02 DIAGNOSIS — C787 Secondary malignant neoplasm of liver and intrahepatic bile duct: Secondary | ICD-10-CM | POA: Diagnosis not present

## 2024-03-02 DIAGNOSIS — Z87891 Personal history of nicotine dependence: Secondary | ICD-10-CM | POA: Diagnosis not present

## 2024-03-02 DIAGNOSIS — D649 Anemia, unspecified: Secondary | ICD-10-CM | POA: Diagnosis not present

## 2024-03-02 LAB — CMP (CANCER CENTER ONLY)
ALT: 15 U/L (ref 0–44)
AST: 47 U/L — ABNORMAL HIGH (ref 15–41)
Albumin: 2.6 g/dL — ABNORMAL LOW (ref 3.5–5.0)
Alkaline Phosphatase: 72 U/L (ref 38–126)
Anion gap: 10 (ref 5–15)
BUN: 8 mg/dL (ref 6–20)
CO2: 26 mmol/L (ref 22–32)
Calcium: 7.7 mg/dL — ABNORMAL LOW (ref 8.9–10.3)
Chloride: 104 mmol/L (ref 98–111)
Creatinine: 0.54 mg/dL (ref 0.44–1.00)
GFR, Estimated: >60 mL/min (ref >60)
Glucose, Bld: 144 mg/dL — ABNORMAL HIGH (ref 70–99)
Potassium: 3.2 mmol/L — ABNORMAL LOW (ref 3.5–5.1)
Sodium: 140 mmol/L (ref 135–145)
Total Bilirubin: 0.7 mg/dL (ref 0.0–1.2)
Total Protein: 7.4 g/dL (ref 6.5–8.1)

## 2024-03-02 LAB — CBC WITH DIFFERENTIAL/PLATELET
Abs Immature Granulocytes: 0.02 10*3/uL (ref 0.00–0.07)
Basophils Absolute: 0 10*3/uL (ref 0.0–0.1)
Basophils Relative: 1 %
Eosinophils Absolute: 0.2 10*3/uL (ref 0.0–0.5)
Eosinophils Relative: 4 %
HCT: 31.4 % — ABNORMAL LOW (ref 36.0–46.0)
Hemoglobin: 9.8 g/dL — ABNORMAL LOW (ref 12.0–15.0)
Immature Granulocytes: 1 %
Lymphocytes Relative: 23 %
Lymphs Abs: 0.8 10*3/uL (ref 0.7–4.0)
MCH: 30.2 pg (ref 26.0–34.0)
MCHC: 31.2 g/dL (ref 30.0–36.0)
MCV: 96.9 fL (ref 80.0–100.0)
Monocytes Absolute: 0.4 10*3/uL (ref 0.1–1.0)
Monocytes Relative: 10 %
Neutro Abs: 2.3 10*3/uL (ref 1.7–7.7)
Neutrophils Relative %: 61 %
Platelets: 195 10*3/uL (ref 150–400)
RBC: 3.24 MIL/uL — ABNORMAL LOW (ref 3.87–5.11)
RDW: 16.5 % — ABNORMAL HIGH (ref 11.5–15.5)
WBC: 3.7 10*3/uL — ABNORMAL LOW (ref 4.0–10.5)
nRBC: 0 % (ref 0.0–0.2)

## 2024-03-02 NOTE — Progress Notes (Signed)
 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  INTERVAL HISTORY: Patient returns to clinic today for hospital follow-up and discussion as to when to reinitiate chemotherapy.  She was recently admitted with bowel perforation and obstruction requiring surgery, but is healing well and nearly back to her baseline.  She has weakness and fatigue, but this is improving.  She denies any pain.  She denies any recent fevers or illnesses.  She has no neurologic complaints.  She has no chest pain, shortness of breath, cough, or hemoptysis.  She denies any nausea, vomiting, constipation, or diarrhea.  She has no melena or hematochezia.  She has no urinary complaints.  Patient offers no further specific complaints today.  REVIEW OF SYSTEMS:   Review of Systems  Constitutional:  Positive for malaise/fatigue. Negative for fever and weight loss.  Respiratory: Negative.  Negative for cough, hemoptysis and shortness of breath.   Cardiovascular: Negative.  Negative for chest pain and leg swelling.  Gastrointestinal: Negative.  Negative for abdominal pain, blood in stool, constipation, diarrhea, melena, nausea and vomiting.  Genitourinary: Negative.  Negative for dysuria.  Musculoskeletal: Negative.  Negative for back pain.  Neurological:  Positive for weakness. Negative for dizziness, focal weakness and headaches.  Psychiatric/Behavioral: Negative.  The patient is not nervous/anxious.     As per HPI. Otherwise, a complete review of systems is negative.  PAST MEDICAL HISTORY: Past Medical History:  Diagnosis Date   Abnormal uterine bleeding     Anemia    Anxiety    Asthma    Cardiomegaly    Cardiovascular disease    COPD (chronic obstructive pulmonary disease) (HCC)    Depression    Diabetes mellitus without complication (HCC)    Dyspnea    Goiter, nontoxic, multinodular    Heart murmur    Hypertension    Hypothyroidism    Lung cancer, primary, with metastasis from lung to other site [facility])    Malignant neoplasm of ascending colon (HCC)    Primary adenocarcinoma of ascending colon (HCC)    Syncopal episodes    Thyrotoxicosis    Uterine leiomyoma     PAST SURGICAL HISTORY: Past Surgical History:  Procedure Laterality Date   BIOPSY  08/12/2023   Procedure: BIOPSY;  Surgeon: Corky Diener, Alphonsus Jeans, MD;  Location: [facility] ENDOSCOPY;  Service: Gastroenterology;;   CESAREAN SECTION     COLONOSCOPY N/A 08/12/2023   Procedure: COLONOSCOPY;  Surgeon: Toledo, Alphonsus Jeans, MD;  Location: [facility] ENDOSCOPY;  Service: Gastroenterology;  Laterality: N/A;  3rd patient   COLONOSCOPY WITH PROPOFOL  N/A 10/29/2015   Procedure: COLONOSCOPY WITH PROPOFOL ;  Surgeon: Luella Sager, MD;  Location: [facility] ENDOSCOPY;  Service: Endoscopy;  Laterality: N/A;   DILITATION & CURRETTAGE/HYSTROSCOPY WITH NOVASURE ABLATION N/A 06/25/2018   Procedure: DILATATION & CURETTAGE/HYSTEROSCOPY WITH MINERVA ABLATION;  Surgeon: Teresa Fender, MD;  Location: [facility] ORS;  Service: Gynecology;  Laterality: N/A;   LAPAROTOMY N/A 02/07/2024   Procedure: LAPAROTOMY, EXPLORATORY SMALL BOWEL RESECTION HERNIA REPAIR;  Surgeon: Alben Alma, MD;  Location: [facility] ORS;  Service: General;  Laterality: N/A;   PORTACATH PLACEMENT N/A 08/26/2023   Procedure: INSERTION PORT-A-CATH;  Surgeon: Eldred Grego, MD;  Location:  [facility] ORS;  Service: General;  Laterality: N/A;   SUBMUCOSAL TATTOO INJECTION  08/12/2023   Procedure: SUBMUCOSAL TATTOO INJECTION;  Surgeon: Toledo, Alphonsus Jeans, MD;  Location: [facility] ENDOSCOPY;  Service: Gastroenterology;;   TUBAL LIGATION      FAMILY  HISTORY: Family History  Adopted: Yes  Problem Relation Age of Onset   Breast cancer Neg Hx     ADVANCED DIRECTIVES (Y/N):  N  HEALTH MAINTENANCE: Social History   Tobacco Use   Smoking status: Former    Current packs/day: 0.00    Types: Cigarettes    Quit date: 2024    Years since quitting: 1.3   Smokeless tobacco: Never  Vaping Use   Vaping status: Former  Substance Use Topics   Alcohol use: Never   Drug use: Never     Colonoscopy:  PAP:  Bone density:  Lipid panel:  No Known Allergies  Current Outpatient Medications  Medication Sig Dispense Refill   albuterol  (PROVENTIL  HFA;VENTOLIN  HFA) 108 (90 Base) MCG/ACT inhaler Inhale 2 puffs into the lungs every 6 (six) hours as needed (for exercise induced asthma).     atorvastatin  (LIPITOR) 10 MG tablet SMARTSIG:1 Tablet(s) By Mouth Every Evening     ezetimibe (ZETIA) 10 MG tablet Take 10 mg by mouth daily.     hydrALAZINE  (APRESOLINE ) 10 MG tablet Take 10 mg by mouth 3 (three) times daily as needed.     levothyroxine  (SYNTHROID ) 100 MCG tablet Take 100 mcg by mouth daily.     lidocaine -prilocaine  (EMLA ) cream Apply to affected area once 30 g 3   metFORMIN (GLUCOPHAGE) 500 MG tablet Take 500 mg by mouth 2 (two) times daily.      metoprolol tartrate (LOPRESSOR) 25 MG tablet Take 25 mg by mouth 2 (two) times daily.     Multiple Vitamins-Minerals (ALIVE ONCE DAILY WOMENS PO) Take 2 tablets by mouth daily. Gummies     ondansetron  (ZOFRAN ) 8 MG tablet Take 1 tablet (8 mg total) by mouth every 8 (eight) hours as needed for nausea or vomiting. Start on the third day after chemotherapy. 60 tablet 1   pantoprazole  (PROTONIX ) 40 MG tablet Take 1 tablet (40 mg total) by mouth daily. 30 tablet 0   prochlorperazine  (COMPAZINE ) 10 MG tablet Take 1 tablet (10 mg total) by mouth every 6 (six) hours as needed for nausea or vomiting. 60 tablet 1   No current facility-administered medications for this visit.   Facility-Administered  Medications Ordered in Other Visits  Medication Dose Route Frequency Provider Last Rate Last Admin   sodium chloride  flush (NS) 0.9 % injection 10 mL  10 mL Intracatheter PRN Shellie Dials, MD   10 mL at 12/09/23 1322    OBJECTIVE: Vitals:   03/02/24 1008  BP: (!) 152/78  Pulse: (!) 58  Resp: 16  Temp: 98.8 F (37.1 C)  SpO2: 100%       Body mass index is 26.55 kg/m.    ECOG FS:1 - Symptomatic but completely ambulatory  General: Well-developed, well-nourished, no acute distress. Eyes: Pink conjunctiva, anicteric sclera. HEENT: Normocephalic, moist mucous membranes. Lungs: No audible wheezing or coughing. Heart: Regular rate and rhythm. Abdomen: Soft, nontender, no obvious distention. Musculoskeletal: No edema, cyanosis, or clubbing. Neuro: Alert, answering all questions appropriately. Cranial nerves grossly intact. Skin: No rashes or petechiae noted. Psych: Normal affect.  LAB RESULTS:  Lab Results  Component Value Date   NA 140 03/02/2024   K 3.2 (L) 03/02/2024   CL 104 03/02/2024  CO2 26 03/02/2024   GLUCOSE 144 (H) 03/02/2024   BUN 8 03/02/2024   CREATININE 0.54 03/02/2024   CALCIUM  7.7 (L) 03/02/2024   PROT 7.4 03/02/2024   ALBUMIN  2.6 (L) 03/02/2024   AST 47 (H) 03/02/2024   ALT 15 03/02/2024   ALKPHOS 72 03/02/2024   BILITOT 0.7 03/02/2024   GFRNONAA >60 03/02/2024   GFRAA >60 06/09/2018    Lab Results  Component Value Date   WBC 3.7 (L) 03/02/2024   NEUTROABS 2.3 03/02/2024   HGB 9.8 (L) 03/02/2024   HCT 31.4 (L) 03/02/2024   MCV 96.9 03/02/2024   PLT 195 03/02/2024     STUDIES: DG ABD ACUTE 2+V W 1V CHEST Result Date: 02/11/2024 CLINICAL DATA:  Postoperative ileus. EXAM: DG ABDOMEN ACUTE WITH 1 VIEW CHEST COMPARISON:  February 09, 2024. FINDINGS: Nasogastric tube tip is seen in proximal stomach. Surgical drain is noted in the pelvis. Calcified uterine fibroid is noted in the pelvis. No acute cardiopulmonary abnormality is seen. Mildly dilated  small bowel loops are again noted most consistent with postoperative ileus. No definite colonic dilatation is noted. IMPRESSION: Mildly dilated small bowel loops are again noted most consistent with postoperative ileus. Electronically Signed   By: Rosalene Colon M.D.   On: 02/11/2024 11:17   DG ABD ACUTE 2+V W 1V CHEST Result Date: 02/09/2024 CLINICAL DATA:  Ileus. EXAM: DG ABDOMEN ACUTE WITH 1 VIEW CHEST COMPARISON:  X-ray 02/08/2024 abdomen.  CT 02/07/2024 FINDINGS: Stable right IJ chest port normal cardiopericardial silhouette. No consolidation, pneumothorax or effusion. No edema. Enteric tube extending beneath the diaphragm. Overlapping cardiac leads. Enteric tube along the stomach. Surgical chain along the low pelvis. Few loops of mildly dilated loops of bowel in the mid abdomen. Few air-fluid levels on the upright view. These could be small bowel loops. Dystrophic calcification in the pelvis. IMPRESSION: Few dilated loops of bowel mid abdomen with some air-fluid levels. Again ileus is favored over obstruction with a history but recommend close follow-up. No obvious free air seen beneath the diaphragm. Enteric tube.  Pelvic surgical drain. Chest port Electronically Signed   By: Adrianna Horde M.D.   On: 02/09/2024 10:59   US  RENAL Result Date: 02/08/2024 CLINICAL DATA:  Acute kidney injury. Status post laparotomy for repair incarcerated ventral hernia with associated small bowel obstruction, small-bowel perforation and peritonitis peer EXAM: RENAL / URINARY TRACT ULTRASOUND COMPLETE COMPARISON:  None Available. FINDINGS: Right Kidney: Renal measurements: 11.3 x 4.6 x 5.6 cm = volume: 150 mL. Echogenicity within normal limits. Mild prominence of renal pelvis without overt hydronephrosis. No renal lesions visualized. Simple cyst of the mid kidney measures up to 1.9 cm and has a benign appearance require no follow-up. Left Kidney: Renal measurements: 10.5 x 5.3 x 5.5 cm = volume: 160 mL. Echogenicity within  normal limits. No mass or hydronephrosis visualized. Bladder: Not visualized due to surgical dressings from recent laparotomy. Other: None. IMPRESSION: 1. Mild prominence of the right renal pelvis without overt hydronephrosis. 2. 1.9 cm simple cyst of the mid right kidney requires no follow-up. 3. Bladder not visualized due to surgical dressings from recent laparotomy. Electronically Signed   By: Erica Hau M.D.   On: 02/08/2024 11:52   ECHOCARDIOGRAM COMPLETE Result Date: 02/08/2024    ECHOCARDIOGRAM REPORT   Patient Name:   CHERITA HEBEL Date of Exam: 02/08/2024 Medical Rec #:  [id number]    Height:       66.0 in Accession #:    [id number]  Weight:       179.0 lb Date of Birth:  06/14/1961    BSA:          1.908 m Patient Age:    62 years     BP:           129/59 mmHg Patient Gender: F            HR:           69 bpm. Exam Location:  [facility] Procedure: 2D Echo, Cardiac Doppler, Color Doppler, Strain Analysis and 3D Echo            (Both Spectral and Color Flow Doppler were utilized during            procedure). Indications:     Elevated Troponin  History:         Patient has no prior history of Echocardiogram examinations.                  COPD, Signs/Symptoms:Murmur; Risk Factors:Hypertension.  Sonographer:     Broadus Canes Referring Phys:  [id number] DIEGO F PABON Diagnosing Phys: Sammy Crisp MD  Sonographer Comments: Global longitudinal strain was attempted. IMPRESSIONS  1. Left ventricular ejection fraction, by estimation, is 70 to 75%. The left ventricle has hyperdynamic function. The left ventricle has no regional wall motion abnormalities. There is moderate left ventricular hypertrophy. Left ventricular diastolic parameters are consistent with Grade I diastolic dysfunction (impaired relaxation). The average left ventricular global longitudinal strain is -15.0 %. The global longitudinal strain is abnormal.  2. Right ventricular systolic function is normal. The right ventricular size is normal.  3. The  mitral valve is normal in structure. Trivial mitral valve regurgitation.  4. The aortic valve has an indeterminant number of cusps. There is mild thickening of the aortic valve. Aortic valve regurgitation is not visualized. Aortic valve sclerosis is present, with no evidence of aortic valve stenosis. FINDINGS  Left Ventricle: Left ventricular ejection fraction, by estimation, is 70 to 75%. The left ventricle has hyperdynamic function. The left ventricle has no regional wall motion abnormalities. The average left ventricular global longitudinal strain is -15.0  %. Strain was performed and the global longitudinal strain is abnormal. 3D ejection fraction reviewed and evaluated as part of the interpretation. Alternate measurement of EF is felt to be most reflective of LV function. The left ventricular internal cavity size was normal in size. There is moderate left ventricular hypertrophy. Left ventricular diastolic parameters are consistent with Grade I diastolic dysfunction (impaired relaxation). Right Ventricle: The right ventricular size is normal. No increase in right ventricular wall thickness. Right ventricular systolic function is normal. Left Atrium: Left atrial size was normal in size. Right Atrium: Right atrial size was normal in size. Pericardium: The pericardium was not well visualized. Mitral Valve: The mitral valve is normal in structure. Trivial mitral valve regurgitation. MV peak gradient, 7.4 mmHg. The mean mitral valve gradient is 2.0 mmHg. Tricuspid Valve: The tricuspid valve is not well visualized. Tricuspid valve regurgitation is not demonstrated. Aortic Valve: The aortic valve has an indeterminant number of cusps. There is mild thickening of the aortic valve. Aortic valve regurgitation is not visualized. Aortic valve sclerosis is present, with no evidence of aortic valve stenosis. Aortic valve mean gradient measures 3.0 mmHg. Aortic valve peak gradient measures 5.3 mmHg. Aortic valve area, by VTI  measures 2.98 cm. Pulmonic Valve: The pulmonic valve was not well visualized. Pulmonic valve regurgitation is not visualized. No evidence of pulmonic stenosis.  Aorta: The aortic root is normal in size and structure. Pulmonary Artery: The pulmonary artery is not well seen. IAS/Shunts: The interatrial septum was not well visualized. Additional Comments: 3D was performed not requiring image post processing on an independent workstation and was abnormal.  LEFT VENTRICLE PLAX 2D LVIDd:         3.36 cm   Diastology LVIDs:         1.48 cm   LV e' medial:    6.09 cm/s LV PW:         1.60 cm   LV E/e' medial:  10.0 LV IVS:        1.63 cm   LV e' lateral:   8.49 cm/s LVOT diam:     2.00 cm   LV E/e' lateral: 7.2 LV SV:         66 LV SV Index:   34        2D Longitudinal Strain LVOT Area:     3.14 cm  2D Strain GLS Avg:     -15.0 %                           3D Volume EF:                          3D EF:        44 % RIGHT VENTRICLE RV Basal diam:  3.20 cm RV Mid diam:    2.40 cm LEFT ATRIUM           Index        RIGHT ATRIUM           Index LA diam:      3.80 cm 1.99 cm/m   RA Area:     11.90 cm LA Vol (A2C): 15.3 ml 8.02 ml/m   RA Volume:   23.60 ml  12.37 ml/m LA Vol (A4C): 25.4 ml 13.31 ml/m  AORTIC VALVE AV Area (Vmax):    2.92 cm AV Area (Vmean):   3.12 cm AV Area (VTI):     2.98 cm AV Vmax:           115.00 cm/s AV Vmean:          78.300 cm/s AV VTI:            0.220 m AV Peak Grad:      5.3 mmHg AV Mean Grad:      3.0 mmHg LVOT Vmax:         107.00 cm/s LVOT Vmean:        77.700 cm/s LVOT VTI:          0.209 m LVOT/AV VTI ratio: 0.95  AORTA Ao Root diam: 3.00 cm MITRAL VALVE MV Area (PHT): 2.17 cm     SHUNTS MV Area VTI:   2.23 cm     Systemic VTI:  0.21 m MV Peak grad:  7.4 mmHg     Systemic Diam: 2.00 cm MV Mean grad:  2.0 mmHg MV Vmax:       1.36 m/s MV Vmean:      70.1 cm/s MV Decel Time: 350 msec MV E velocity: 60.80 cm/s MV A velocity: 117.00 cm/s MV E/A ratio:  0.52 Veryl Gottron End MD Electronically  signed by Sammy Crisp MD Signature Date/Time: 02/08/2024/10:43:24 AM    Final    US  EKG SITE RITE Result Date: 02/08/2024 If Site Rite image not attached,  placement could not be confirmed due to current cardiac rhythm.  DG Abd Portable 1V Result Date: 02/08/2024 CLINICAL DATA:  Ileus falling gastrointestinal surgery EXAM: PORTABLE ABDOMEN - 1 VIEW COMPARISON:  CT abdomen pelvis 02/07/2024 FINDINGS: Marked gaseous dilation of the small bowel in the central abdomen measuring up to 6.0 cm. Enteric tube tip and side-port in the stomach. Pelvic catheters or drains. IMPRESSION: Marked gaseous dilation of the small bowel in the central abdomen measuring up to 6.0 cm. Findings may be due to ileus or obstruction. Electronically Signed   By: Rozell Cornet M.D.   On: 02/08/2024 02:19   CT ABDOMEN PELVIS W CONTRAST Result Date: 02/07/2024 CLINICAL DATA:  GI upset history of colon liver and lung cancer EXAM: CT ABDOMEN AND PELVIS WITH CONTRAST TECHNIQUE: Multidetector CT imaging of the abdomen and pelvis was performed using the standard protocol following bolus administration of intravenous contrast. RADIATION DOSE REDUCTION: This exam was performed according to the departmental dose-optimization program which includes automated exposure control, adjustment of the mA and/or kV according to patient size and/or use of iterative reconstruction technique. CONTRAST:  80mL OMNIPAQUE  IOHEXOL  300 MG/ML  SOLN COMPARISON:  CT 11/30/2023, 08/05/2023 FINDINGS: Lower chest: Lung bases demonstrate scattered pulmonary nodules consistent with metastatic disease. Slight decreased size of left lower lobe index lesion on series 8, image 8, measures 9 x 9 mm, previously 12 x 12 mm. However there appear to be several new small pulmonary nodules, for example 8 mm left lower lobe pulmonary nodule on series 4, image 9, central right lung base pulmonary nodule measuring 8 mm on series 4, image 9, and punctate 4 mm right anterior lung base  nodule on series 4, image 7. Faint 3 mm right posterior lower lobe pulmonary nodule on series 4, image 10. Hepatobiliary: Hepatic metastatic disease again noted. Interval decrease in size of previously measured lesions, for example caudate lesion measures 38 x 26 mm on series 2, image 23, previously 48 by 35 mm. Right hepatic lobe lesion measures 25 x 25 mm on series 2, image 23, previously 33 x 27 mm. However interim finding of multiple subcentimeter hypodense liver lesions not clearly seen on the prior exam. No biliary dilatation. No calcified gallstones. Pancreas: Unremarkable. No pancreatic ductal dilatation or surrounding inflammatory changes. Spleen: Normal in size without focal abnormality. Adrenals/Urinary Tract: Adrenal glands are normal. Kidneys show no hydronephrosis. Bladder is decompressed Stomach/Bowel: Stomach decompressed. Interval multiple fluid-filled dilated loops of small bowel measuring up to 5.7 cm consistent with bowel obstruction. Point of transition is related to moderate complex ventral hernia which contains mesenteric fat, fluid and slightly thickened small bowel loop. Small bowel distal to this is completely decompressed. Cecal mass is again visualized as prominent wall thickening on coronal series 5, image 56. When measured in a similar fashion compared with the prior exam, measures about 5 x 4.2 cm on series 2, image 56, previously 6.6 x 5 cm. Slightly thickened appearance of the appendix but no definite inflammatory change. Vascular/Lymphatic: Mild aortic atherosclerosis. No aneurysm. No enlarging lymph nodes. Reproductive: Calcified uterine fibroid. Posterior pelvic mass that is probably associated with the right ovary/adnexa is slightly diminished as well 6.7 x 4.6 cm on series 2, image 78, previously 7.2 x 5.5 cm when measured in a similar fashion. Some mass effect on the adjacent rectosigmoid colon. Other: Trace ascites in the pelvis. Small volume free fluid within the hernia.  Small amount of free gas within the hernia sac, series 2, image 68 and series  2, image 67. Musculoskeletal: Stable osseous structures without acute1 finding. IMPRESSION: 1. Interval development of small-bowel obstruction with point of transition related to moderate complex ventral hernia which contains mesenteric fat, fluid and slightly thickened small bowel loop. Small amount of free gas within the hernia sac, concern for incarcerated hernia with possible bowel perforation. Small volume free fluid within the hernia. 2. Interval decrease in size of previously measured hepatic metastatic lesions. However interim finding of multiple subcentimeter hypodense liver lesions/suspected metastatic lesions not clearly seen on the prior exam. 3. Slight decreased size of cecal mass. Slightly decreased size of posterior pelvic mass that is probably associated with the right ovary/adnexa. 4. Slight decreased size of index left lower lobe pulmonary nodule, however there appear to be several new small pulmonary nodules at the lung bases concerning for metastatic disease. 5. Aortic atherosclerosis. Critical Value/emergent results were called by telephone at the time of interpretation on 02/07/2024 at 9:05 pm to provider Mona Angle , who verbally acknowledged these results. Electronically Signed   By: Esmeralda Hedge M.D.   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  CT scan results from February 07, 2024 revealed her small bowel obstruction, but interval decrease in size of hepatic metastatic lesions.  She did however see multiple subcentimeter lesions in the liver which are suspected metastatic, but unclear.  CEA had trended down from 1780-237, but now has trended up to 721.  Patient will require continuation of  treatment with FOLFOX once she recovers from her surgery.  Will discontinue Avastin  altogether given her bowel perforation and obstruction.  Return to clinic in 3 weeks for further evaluation at which time we will determine when to reinitiate treatment.    Uterine masses: Patient reports she has a history of benign fibroids.  Appreciate gynecology input.  Monitor.   Anemia: Chronic and unchanged.  Patient's most recent hemoglobin is 9.8. Leukopenia: Mild.  Patient's ANC is now within normal limits. Thrombocytopenia: Resolved. Hypokalemia: Mild.  Patient was previously given dietary changes. Insomnia: Significantly improved.  Patient does not complain of this today. Coping/adjustment: Patient was previously given a referral to [facility]. Hypertension: Blood pressure remains elevated, but mildly improved.  Discontinue Avastin  as above.  Continue monitoring and treatment per primary care.    Patient expressed understanding and was in agreement with this plan. She also understands that She can call clinic at any time with any questions, concerns, or complaints.    Cancer Staging  Primary adenocarcinoma of ascending colon [facility]) Staging form: Colon and Rectum, AJCC 8th Edition - Clinical stage from 08/17/2023: Stage IVB (cTX, cNX, pM1b) - Signed by Shellie Dials, MD on 08/17/2023 Stage prefix: Initial diagnosis   Shellie Dials, MD   03/05/2024 6:38 AM

## 2024-03-02 NOTE — Progress Notes (Signed)
 Palliative Medicine [facility] [phone] [phone]   Name: Alexis Garcia Date: 03/02/2024 MRN: [id number]  DOB: 1961/06/17  Patient Care Team: Sharyne Degree, FNP as PCP - General (Family Medicine) Rochell Chroman, RN as Oncology Nurse Navigator Adrian Alba, Deadra Everts, MD as Consulting Physician (Oncology)    REASON FOR CONSULTATION: Alexis Garcia is a 63 y.o. 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  Patient hospitalized in April 2025 with SBO and perforation due to strangulated ventral hernia.  Patient underwent surgical repair.  Palliative care consulted to address goals manage ongoing symptoms.  SOCIAL HISTORY:     reports that she quit smoking about 15 months ago. Her smoking use included cigarettes. She has never used smokeless tobacco. She reports that she does not drink alcohol and does not use drugs.  Patient is divorced and lives at home alone.  Her oldest daughter has lung cancer.  She has a son who lives nearby and another daughter in [place] .  Patient worked at [employer] in [employer].  ADVANCE DIRECTIVES:    CODE STATUS:   PAST MEDICAL HISTORY: Past Medical History:  Diagnosis Date   Abnormal uterine bleeding    Anemia    Anxiety    Asthma    Cardiomegaly    Cardiovascular disease    COPD (chronic obstructive pulmonary disease) (HCC)    Depression    Diabetes mellitus without complication (HCC)    Dyspnea    Goiter, nontoxic, multinodular    Heart murmur    Hypertension    Hypothyroidism    Lung cancer, primary, with metastasis from lung to other site [facility])    Malignant neoplasm of ascending colon (HCC)    Primary adenocarcinoma of ascending colon (HCC)    Syncopal episodes    Thyrotoxicosis    Uterine leiomyoma     PAST SURGICAL HISTORY:  Past Surgical History:   Procedure Laterality Date   BIOPSY  08/12/2023   Procedure: BIOPSY;  Surgeon: Corky Diener, Alphonsus Jeans, MD;  Location: [facility] ENDOSCOPY;  Service: Gastroenterology;;   CESAREAN SECTION     COLONOSCOPY N/A 08/12/2023   Procedure: COLONOSCOPY;  Surgeon: Toledo, Alphonsus Jeans, MD;  Location: [facility] ENDOSCOPY;  Service: Gastroenterology;  Laterality: N/A;  3rd patient   COLONOSCOPY WITH PROPOFOL  N/A 10/29/2015   Procedure: COLONOSCOPY WITH PROPOFOL ;  Surgeon: Luella Sager, MD;  Location: [facility] ENDOSCOPY;  Service: Endoscopy;  Laterality: N/A;   DILITATION & CURRETTAGE/HYSTROSCOPY WITH NOVASURE ABLATION N/A 06/25/2018   Procedure: DILATATION & CURETTAGE/HYSTEROSCOPY WITH MINERVA ABLATION;  Surgeon: Teresa Fender, MD;  Location: [facility] ORS;  Service: Gynecology;  Laterality: N/A;   LAPAROTOMY N/A 02/07/2024   Procedure: LAPAROTOMY, EXPLORATORY SMALL BOWEL RESECTION HERNIA REPAIR;  Surgeon: Alben Alma, MD;  Location: [facility] ORS;  Service: General;  Laterality: N/A;   PORTACATH PLACEMENT N/A 08/26/2023   Procedure: INSERTION PORT-A-CATH;  Surgeon: Eldred Grego, MD;  Location: [facility] ORS;  Service: General;  Laterality: N/A;   SUBMUCOSAL TATTOO INJECTION  08/12/2023   Procedure: SUBMUCOSAL TATTOO INJECTION;  Surgeon: Toledo, Alphonsus Jeans, MD;  Location: [facility] ENDOSCOPY;  Service: Gastroenterology;;   TUBAL LIGATION      HEMATOLOGY/ONCOLOGY HISTORY:  Oncology History  Primary adenocarcinoma of ascending colon (HCC)  08/17/2023 Initial Diagnosis   Primary adenocarcinoma of ascending colon (HCC)   08/17/2023 Cancer Staging   Staging form: Colon and Rectum, AJCC 8th  Edition - Clinical stage from 08/17/2023: Stage IVB (cTX, cNX, pM1b) - Signed by Shellie Dials, MD on 08/17/2023 Stage prefix: Initial diagnosis   08/31/2023 -  Chemotherapy   Patient is on Treatment Plan : COLORECTAL FOLFOX + Bevacizumab  q14d       ALLERGIES:  has no known allergies.  MEDICATIONS:  Current Outpatient Medications   Medication Sig Dispense Refill   albuterol  (PROVENTIL  HFA;VENTOLIN  HFA) 108 (90 Base) MCG/ACT inhaler Inhale 2 puffs into the lungs every 6 (six) hours as needed (for exercise induced asthma).     atorvastatin  (LIPITOR) 10 MG tablet SMARTSIG:1 Tablet(s) By Mouth Every Evening     ezetimibe (ZETIA) 10 MG tablet Take 10 mg by mouth daily.     hydrALAZINE  (APRESOLINE ) 10 MG tablet Take 10 mg by mouth 3 (three) times daily as needed.     levothyroxine  (SYNTHROID ) 100 MCG tablet Take 100 mcg by mouth daily.     lidocaine -prilocaine  (EMLA ) cream Apply to affected area once 30 g 3   metFORMIN (GLUCOPHAGE) 500 MG tablet Take 500 mg by mouth 2 (two) times daily.      metoprolol tartrate (LOPRESSOR) 25 MG tablet Take 25 mg by mouth 2 (two) times daily.     Multiple Vitamins-Minerals (ALIVE ONCE DAILY WOMENS PO) Take 2 tablets by mouth daily. Gummies     ondansetron  (ZOFRAN ) 8 MG tablet Take 1 tablet (8 mg total) by mouth every 8 (eight) hours as needed for nausea or vomiting. Start on the third day after chemotherapy. 60 tablet 1   pantoprazole  (PROTONIX ) 40 MG tablet Take 1 tablet (40 mg total) by mouth daily. 30 tablet 0   prochlorperazine  (COMPAZINE ) 10 MG tablet Take 1 tablet (10 mg total) by mouth every 6 (six) hours as needed for nausea or vomiting. 60 tablet 1   No current facility-administered medications for this visit.   Facility-Administered Medications Ordered in Other Visits  Medication Dose Route Frequency Provider Last Rate Last Admin   sodium chloride  flush (NS) 0.9 % injection 10 mL  10 mL Intracatheter PRN Shellie Dials, MD   10 mL at 12/09/23 1322    VITAL SIGNS: There were no vitals taken for this visit. There were no vitals filed for this visit.  Estimated body mass index is 26.55 kg/m as calculated from the following:   Height as of an earlier encounter on 03/02/24: 5' 6.5" (1.689 m).   Weight as of an earlier encounter on 03/02/24: 167 lb (75.8 kg).  LABS: CBC:     Component Value Date/Time   WBC 3.7 (L) 03/02/2024 0933   HGB 9.8 (L) 03/02/2024 0933   HGB 10.2 (L) 02/01/2024 0827   HGB 13.7 11/22/2021 0912   HCT 31.4 (L) 03/02/2024 0933   HCT 40.3 11/22/2021 0912   PLT 195 03/02/2024 0933   PLT 149 (L) 02/01/2024 0827   PLT 240 11/22/2021 0912   MCV 96.9 03/02/2024 0933   MCV 89 11/22/2021 0912   NEUTROABS 2.3 03/02/2024 0933   NEUTROABS 2.5 11/22/2021 0912   LYMPHSABS 0.8 03/02/2024 0933   LYMPHSABS 1.9 11/22/2021 0912   MONOABS 0.4 03/02/2024 0933   EOSABS 0.2 03/02/2024 0933   EOSABS 0.1 11/22/2021 0912   BASOSABS 0.0 03/02/2024 0933   BASOSABS 0.0 11/22/2021 0912   Comprehensive Metabolic Panel:    Component Value Date/Time   NA 140 03/02/2024 0934   NA 141 09/13/2021 0857   K 3.2 (L) 03/02/2024 0934   CL 104 03/02/2024 0934  CO2 26 03/02/2024 0934   BUN 8 03/02/2024 0934   BUN 17 09/13/2021 0857   CREATININE 0.54 03/02/2024 0934   GLUCOSE 144 (H) 03/02/2024 0934   CALCIUM  7.7 (L) 03/02/2024 0934   AST 47 (H) 03/02/2024 0934   ALT 15 03/02/2024 0934   ALKPHOS 72 03/02/2024 0934   BILITOT 0.7 03/02/2024 0934   PROT 7.4 03/02/2024 0934   PROT 7.5 09/13/2021 0857   ALBUMIN  2.6 (L) 03/02/2024 0934   ALBUMIN  4.6 09/13/2021 0857    RADIOGRAPHIC STUDIES: DG ABD ACUTE 2+V W 1V CHEST Result Date: 02/11/2024 CLINICAL DATA:  Postoperative ileus. EXAM: DG ABDOMEN ACUTE WITH 1 VIEW CHEST COMPARISON:  February 09, 2024. FINDINGS: Nasogastric tube tip is seen in proximal stomach. Surgical drain is noted in the pelvis. Calcified uterine fibroid is noted in the pelvis. No acute cardiopulmonary abnormality is seen. Mildly dilated small bowel loops are again noted most consistent with postoperative ileus. No definite colonic dilatation is noted. IMPRESSION: Mildly dilated small bowel loops are again noted most consistent with postoperative ileus. Electronically Signed   By: Rosalene Colon M.D.   On: 02/11/2024 11:17   DG ABD ACUTE 2+V W 1V  CHEST Result Date: 02/09/2024 CLINICAL DATA:  Ileus. EXAM: DG ABDOMEN ACUTE WITH 1 VIEW CHEST COMPARISON:  X-ray 02/08/2024 abdomen.  CT 02/07/2024 FINDINGS: Stable right IJ chest port normal cardiopericardial silhouette. No consolidation, pneumothorax or effusion. No edema. Enteric tube extending beneath the diaphragm. Overlapping cardiac leads. Enteric tube along the stomach. Surgical chain along the low pelvis. Few loops of mildly dilated loops of bowel in the mid abdomen. Few air-fluid levels on the upright view. These could be small bowel loops. Dystrophic calcification in the pelvis. IMPRESSION: Few dilated loops of bowel mid abdomen with some air-fluid levels. Again ileus is favored over obstruction with a history but recommend close follow-up. No obvious free air seen beneath the diaphragm. Enteric tube.  Pelvic surgical drain. Chest port Electronically Signed   By: Adrianna Horde M.D.   On: 02/09/2024 10:59   US  RENAL Result Date: 02/08/2024 CLINICAL DATA:  Acute kidney injury. Status post laparotomy for repair incarcerated ventral hernia with associated small bowel obstruction, small-bowel perforation and peritonitis peer EXAM: RENAL / URINARY TRACT ULTRASOUND COMPLETE COMPARISON:  None Available. FINDINGS: Right Kidney: Renal measurements: 11.3 x 4.6 x 5.6 cm = volume: 150 mL. Echogenicity within normal limits. Mild prominence of renal pelvis without overt hydronephrosis. No renal lesions visualized. Simple cyst of the mid kidney measures up to 1.9 cm and has a benign appearance require no follow-up. Left Kidney: Renal measurements: 10.5 x 5.3 x 5.5 cm = volume: 160 mL. Echogenicity within normal limits. No mass or hydronephrosis visualized. Bladder: Not visualized due to surgical dressings from recent laparotomy. Other: None. IMPRESSION: 1. Mild prominence of the right renal pelvis without overt hydronephrosis. 2. 1.9 cm simple cyst of the mid right kidney requires no follow-up. 3. Bladder not  visualized due to surgical dressings from recent laparotomy. Electronically Signed   By: Erica Hau M.D.   On: 02/08/2024 11:52   ECHOCARDIOGRAM COMPLETE Result Date: 02/08/2024    ECHOCARDIOGRAM REPORT   Patient Name:   Alexis Garcia Date of Exam: 02/08/2024 Medical Rec #:  [id number]    Height:       66.0 in Accession #:    [id number]   Weight:       179.0 lb Date of Birth:  1960-12-08    BSA:  1.908 m Patient Age:    62 years     BP:           129/59 mmHg Patient Gender: F            HR:           69 bpm. Exam Location:  [facility] Procedure: 2D Echo, Cardiac Doppler, Color Doppler, Strain Analysis and 3D Echo            (Both Spectral and Color Flow Doppler were utilized during            procedure). Indications:     Elevated Troponin  History:         Patient has no prior history of Echocardiogram examinations.                  COPD, Signs/Symptoms:Murmur; Risk Factors:Hypertension.  Sonographer:     Broadus Canes Referring Phys:  [id number] DIEGO F PABON Diagnosing Phys: Sammy Crisp MD  Sonographer Comments: Global longitudinal strain was attempted. IMPRESSIONS  1. Left ventricular ejection fraction, by estimation, is 70 to 75%. The left ventricle has hyperdynamic function. The left ventricle has no regional wall motion abnormalities. There is moderate left ventricular hypertrophy. Left ventricular diastolic parameters are consistent with Grade I diastolic dysfunction (impaired relaxation). The average left ventricular global longitudinal strain is -15.0 %. The global longitudinal strain is abnormal.  2. Right ventricular systolic function is normal. The right ventricular size is normal.  3. The mitral valve is normal in structure. Trivial mitral valve regurgitation.  4. The aortic valve has an indeterminant number of cusps. There is mild thickening of the aortic valve. Aortic valve regurgitation is not visualized. Aortic valve sclerosis is present, with no evidence of aortic valve stenosis. FINDINGS   Left Ventricle: Left ventricular ejection fraction, by estimation, is 70 to 75%. The left ventricle has hyperdynamic function. The left ventricle has no regional wall motion abnormalities. The average left ventricular global longitudinal strain is -15.0  %. Strain was performed and the global longitudinal strain is abnormal. 3D ejection fraction reviewed and evaluated as part of the interpretation. Alternate measurement of EF is felt to be most reflective of LV function. The left ventricular internal cavity size was normal in size. There is moderate left ventricular hypertrophy. Left ventricular diastolic parameters are consistent with Grade I diastolic dysfunction (impaired relaxation). Right Ventricle: The right ventricular size is normal. No increase in right ventricular wall thickness. Right ventricular systolic function is normal. Left Atrium: Left atrial size was normal in size. Right Atrium: Right atrial size was normal in size. Pericardium: The pericardium was not well visualized. Mitral Valve: The mitral valve is normal in structure. Trivial mitral valve regurgitation. MV peak gradient, 7.4 mmHg. The mean mitral valve gradient is 2.0 mmHg. Tricuspid Valve: The tricuspid valve is not well visualized. Tricuspid valve regurgitation is not demonstrated. Aortic Valve: The aortic valve has an indeterminant number of cusps. There is mild thickening of the aortic valve. Aortic valve regurgitation is not visualized. Aortic valve sclerosis is present, with no evidence of aortic valve stenosis. Aortic valve mean gradient measures 3.0 mmHg. Aortic valve peak gradient measures 5.3 mmHg. Aortic valve area, by VTI measures 2.98 cm. Pulmonic Valve: The pulmonic valve was not well visualized. Pulmonic valve regurgitation is not visualized. No evidence of pulmonic stenosis. Aorta: The aortic root is normal in size and structure. Pulmonary Artery: The pulmonary artery is not well seen. IAS/Shunts: The interatrial septum was  not well visualized.  Additional Comments: 3D was performed not requiring image post processing on an independent workstation and was abnormal.  LEFT VENTRICLE PLAX 2D LVIDd:         3.36 cm   Diastology LVIDs:         1.48 cm   LV e' medial:    6.09 cm/s LV PW:         1.60 cm   LV E/e' medial:  10.0 LV IVS:        1.63 cm   LV e' lateral:   8.49 cm/s LVOT diam:     2.00 cm   LV E/e' lateral: 7.2 LV SV:         66 LV SV Index:   34        2D Longitudinal Strain LVOT Area:     3.14 cm  2D Strain GLS Avg:     -15.0 %                           3D Volume EF:                          3D EF:        44 % RIGHT VENTRICLE RV Basal diam:  3.20 cm RV Mid diam:    2.40 cm LEFT ATRIUM           Index        RIGHT ATRIUM           Index LA diam:      3.80 cm 1.99 cm/m   RA Area:     11.90 cm LA Vol (A2C): 15.3 ml 8.02 ml/m   RA Volume:   23.60 ml  12.37 ml/m LA Vol (A4C): 25.4 ml 13.31 ml/m  AORTIC VALVE AV Area (Vmax):    2.92 cm AV Area (Vmean):   3.12 cm AV Area (VTI):     2.98 cm AV Vmax:           115.00 cm/s AV Vmean:          78.300 cm/s AV VTI:            0.220 m AV Peak Grad:      5.3 mmHg AV Mean Grad:      3.0 mmHg LVOT Vmax:         107.00 cm/s LVOT Vmean:        77.700 cm/s LVOT VTI:          0.209 m LVOT/AV VTI ratio: 0.95  AORTA Ao Root diam: 3.00 cm MITRAL VALVE MV Area (PHT): 2.17 cm     SHUNTS MV Area VTI:   2.23 cm     Systemic VTI:  0.21 m MV Peak grad:  7.4 mmHg     Systemic Diam: 2.00 cm MV Mean grad:  2.0 mmHg MV Vmax:       1.36 m/s MV Vmean:      70.1 cm/s MV Decel Time: 350 msec MV E velocity: 60.80 cm/s MV A velocity: 117.00 cm/s MV E/A ratio:  0.52 Veryl Gottron End MD Electronically signed by Sammy Crisp MD Signature Date/Time: 02/08/2024/10:43:24 AM    Final    US  EKG SITE RITE Result Date: 02/08/2024 If Site Rite image not attached, placement could not be confirmed due to current cardiac rhythm.  DG Abd Portable 1V Result Date: 02/08/2024 CLINICAL DATA:  Ileus falling  gastrointestinal surgery EXAM: PORTABLE  ABDOMEN - 1 VIEW COMPARISON:  CT abdomen pelvis 02/07/2024 FINDINGS: Marked gaseous dilation of the small bowel in the central abdomen measuring up to 6.0 cm. Enteric tube tip and side-port in the stomach. Pelvic catheters or drains. IMPRESSION: Marked gaseous dilation of the small bowel in the central abdomen measuring up to 6.0 cm. Findings may be due to ileus or obstruction. Electronically Signed   By: Rozell Cornet M.D.   On: 02/08/2024 02:19   CT ABDOMEN PELVIS W CONTRAST Result Date: 02/07/2024 CLINICAL DATA:  GI upset history of colon liver and lung cancer EXAM: CT ABDOMEN AND PELVIS WITH CONTRAST TECHNIQUE: Multidetector CT imaging of the abdomen and pelvis was performed using the standard protocol following bolus administration of intravenous contrast. RADIATION DOSE REDUCTION: This exam was performed according to the departmental dose-optimization program which includes automated exposure control, adjustment of the mA and/or kV according to patient size and/or use of iterative reconstruction technique. CONTRAST:  80mL OMNIPAQUE  IOHEXOL  300 MG/ML  SOLN COMPARISON:  CT 11/30/2023, 08/05/2023 FINDINGS: Lower chest: Lung bases demonstrate scattered pulmonary nodules consistent with metastatic disease. Slight decreased size of left lower lobe index lesion on series 8, image 8, measures 9 x 9 mm, previously 12 x 12 mm. However there appear to be several new small pulmonary nodules, for example 8 mm left lower lobe pulmonary nodule on series 4, image 9, central right lung base pulmonary nodule measuring 8 mm on series 4, image 9, and punctate 4 mm right anterior lung base nodule on series 4, image 7. Faint 3 mm right posterior lower lobe pulmonary nodule on series 4, image 10. Hepatobiliary: Hepatic metastatic disease again noted. Interval decrease in size of previously measured lesions, for example caudate lesion measures 38 x 26 mm on series 2, image 23, previously  48 by 35 mm. Right hepatic lobe lesion measures 25 x 25 mm on series 2, image 23, previously 33 x 27 mm. However interim finding of multiple subcentimeter hypodense liver lesions not clearly seen on the prior exam. No biliary dilatation. No calcified gallstones. Pancreas: Unremarkable. No pancreatic ductal dilatation or surrounding inflammatory changes. Spleen: Normal in size without focal abnormality. Adrenals/Urinary Tract: Adrenal glands are normal. Kidneys show no hydronephrosis. Bladder is decompressed Stomach/Bowel: Stomach decompressed. Interval multiple fluid-filled dilated loops of small bowel measuring up to 5.7 cm consistent with bowel obstruction. Point of transition is related to moderate complex ventral hernia which contains mesenteric fat, fluid and slightly thickened small bowel loop. Small bowel distal to this is completely decompressed. Cecal mass is again visualized as prominent wall thickening on coronal series 5, image 56. When measured in a similar fashion compared with the prior exam, measures about 5 x 4.2 cm on series 2, image 56, previously 6.6 x 5 cm. Slightly thickened appearance of the appendix but no definite inflammatory change. Vascular/Lymphatic: Mild aortic atherosclerosis. No aneurysm. No enlarging lymph nodes. Reproductive: Calcified uterine fibroid. Posterior pelvic mass that is probably associated with the right ovary/adnexa is slightly diminished as well 6.7 x 4.6 cm on series 2, image 78, previously 7.2 x 5.5 cm when measured in a similar fashion. Some mass effect on the adjacent rectosigmoid colon. Other: Trace ascites in the pelvis. Small volume free fluid within the hernia. Small amount of free gas within the hernia sac, series 2, image 68 and series 2, image 67. Musculoskeletal: Stable osseous structures without acute1 finding. IMPRESSION: 1. Interval development of small-bowel obstruction with point of transition related to moderate complex ventral hernia which  contains  mesenteric fat, fluid and slightly thickened small bowel loop. Small amount of free gas within the hernia sac, concern for incarcerated hernia with possible bowel perforation. Small volume free fluid within the hernia. 2. Interval decrease in size of previously measured hepatic metastatic lesions. However interim finding of multiple subcentimeter hypodense liver lesions/suspected metastatic lesions not clearly seen on the prior exam. 3. Slight decreased size of cecal mass. Slightly decreased size of posterior pelvic mass that is probably associated with the right ovary/adnexa. 4. Slight decreased size of index left lower lobe pulmonary nodule, however there appear to be several new small pulmonary nodules at the lung bases concerning for metastatic disease. 5. Aortic atherosclerosis. Critical Value/emergent results were called by telephone at the time of interpretation on 02/07/2024 at 9:05 pm to provider Mona Angle , who verbally acknowledged these results. Electronically Signed   By: Esmeralda Hedge M.D.   On: 02/07/2024 21:05    PERFORMANCE STATUS (ECOG) : 1 - Symptomatic but completely ambulatory  Review of Systems Unless otherwise noted, a complete review of systems is negative.  Physical Exam General: NAD Pulmonary: Unlabored Extremities: no edema, no joint deformities Skin: no rashes Neurological: nonfocal  IMPRESSION: Patient recently hospitalized with SBO/perforation from a strangulated hernia.  She underwent surgical repair and has posthospital follow-up today in clinic to see Dr. Adrian Alba and E.  CT of abdomen and pelvis while hospitalized showed overall treatment response with interval decrease in size of previously measured hepatic metastatic lesions and decreased size of cecal and posterior pelvic masses.  Today, patient states that she is feeling improvement each week.  She denies significant pain or other symptoms at present.  Patient understands that she has stage IV disease and  that treatment is with palliative intent.  She is still interested in pursuing additional treatments.  Patient does not have advanced directives but is interested in establishing those.  She plans to speak with her family to discuss decision making.  Patient says that she does not think that she would want aggressive measures at end-of-life if they were felt to be futile.  She cites an example of having to withdrawal care on her mother and does not wish to put her children through that experience.  I sent patient home with a MOST form to review.  PLAN: -Continue current scope of treatment - Patient speaking with family about decision making - MOST Form reviewed - Referral to SW for ACP clinic - Follow-up telephone visit 1 month  Case and plan discussed with Dr. Adrian Alba  Patient expressed understanding and was in agreement with this plan. She also understands that She can call the clinic at any time with any questions, concerns, or complaints.     Time Total: 25 minutes  Visit consisted of counseling and education dealing with the complex and emotionally intense issues of symptom management and palliative care in the setting of serious and potentially life-threatening illness.Greater than 50%  of this time was spent counseling and coordinating care related to the above assessment and plan.  Signed by: Gerilyn Kobus, PhD, NP-C

## 2024-03-03 LAB — CEA: CEA: 721 ng/mL — ABNORMAL HIGH (ref 0.0–4.7)

## 2024-03-05 ENCOUNTER — Encounter: Payer: Self-pay | Admitting: Oncology

## 2024-03-23 ENCOUNTER — Other Ambulatory Visit: Payer: Self-pay | Admitting: *Deleted

## 2024-03-23 DIAGNOSIS — C182 Malignant neoplasm of ascending colon: Secondary | ICD-10-CM

## 2024-03-24 ENCOUNTER — Encounter: Payer: Self-pay | Admitting: Oncology

## 2024-03-24 ENCOUNTER — Inpatient Hospital Stay (HOSPITAL_BASED_OUTPATIENT_CLINIC_OR_DEPARTMENT_OTHER): Admitting: Oncology

## 2024-03-24 ENCOUNTER — Inpatient Hospital Stay: Payer: Self-pay | Attending: Oncology

## 2024-03-24 VITALS — BP 155/92 | HR 60 | Temp 97.3°F | Resp 16 | Ht 66.5 in | Wt 161.0 lb

## 2024-03-24 DIAGNOSIS — I10 Essential (primary) hypertension: Secondary | ICD-10-CM | POA: Diagnosis not present

## 2024-03-24 DIAGNOSIS — Z87891 Personal history of nicotine dependence: Secondary | ICD-10-CM | POA: Diagnosis not present

## 2024-03-24 DIAGNOSIS — D649 Anemia, unspecified: Secondary | ICD-10-CM | POA: Insufficient documentation

## 2024-03-24 DIAGNOSIS — C787 Secondary malignant neoplasm of liver and intrahepatic bile duct: Secondary | ICD-10-CM | POA: Diagnosis not present

## 2024-03-24 DIAGNOSIS — C7800 Secondary malignant neoplasm of unspecified lung: Secondary | ICD-10-CM | POA: Insufficient documentation

## 2024-03-24 DIAGNOSIS — G4700 Insomnia, unspecified: Secondary | ICD-10-CM | POA: Insufficient documentation

## 2024-03-24 DIAGNOSIS — D269 Other benign neoplasm of uterus, unspecified: Secondary | ICD-10-CM | POA: Insufficient documentation

## 2024-03-24 DIAGNOSIS — C182 Malignant neoplasm of ascending colon: Secondary | ICD-10-CM | POA: Diagnosis present

## 2024-03-24 LAB — CBC WITH DIFFERENTIAL/PLATELET
Abs Immature Granulocytes: 0.02 10*3/uL (ref 0.00–0.07)
Basophils Absolute: 0 10*3/uL (ref 0.0–0.1)
Basophils Relative: 1 %
Eosinophils Absolute: 0.3 10*3/uL (ref 0.0–0.5)
Eosinophils Relative: 6 %
HCT: 28.6 % — ABNORMAL LOW (ref 36.0–46.0)
Hemoglobin: 9 g/dL — ABNORMAL LOW (ref 12.0–15.0)
Immature Granulocytes: 1 %
Lymphocytes Relative: 18 %
Lymphs Abs: 0.8 10*3/uL (ref 0.7–4.0)
MCH: 29.3 pg (ref 26.0–34.0)
MCHC: 31.5 g/dL (ref 30.0–36.0)
MCV: 93.2 fL (ref 80.0–100.0)
Monocytes Absolute: 0.4 10*3/uL (ref 0.1–1.0)
Monocytes Relative: 8 %
Neutro Abs: 3 10*3/uL (ref 1.7–7.7)
Neutrophils Relative %: 66 %
Platelets: 260 10*3/uL (ref 150–400)
RBC: 3.07 MIL/uL — ABNORMAL LOW (ref 3.87–5.11)
RDW: 16.2 % — ABNORMAL HIGH (ref 11.5–15.5)
WBC: 4.4 10*3/uL (ref 4.0–10.5)
nRBC: 0 % (ref 0.0–0.2)

## 2024-03-24 LAB — CMP (CANCER CENTER ONLY)
ALT: 26 U/L (ref 0–44)
AST: 49 U/L — ABNORMAL HIGH (ref 15–41)
Albumin: 2.8 g/dL — ABNORMAL LOW (ref 3.5–5.0)
Alkaline Phosphatase: 129 U/L — ABNORMAL HIGH (ref 38–126)
Anion gap: 11 (ref 5–15)
BUN: 9 mg/dL (ref 6–20)
CO2: 25 mmol/L (ref 22–32)
Calcium: 8.4 mg/dL — ABNORMAL LOW (ref 8.9–10.3)
Chloride: 101 mmol/L (ref 98–111)
Creatinine: 0.61 mg/dL (ref 0.44–1.00)
GFR, Estimated: >60 mL/min (ref >60)
Glucose, Bld: 94 mg/dL (ref 70–99)
Potassium: 3.9 mmol/L (ref 3.5–5.1)
Sodium: 137 mmol/L (ref 135–145)
Total Bilirubin: 0.8 mg/dL (ref 0.0–1.2)
Total Protein: 7.8 g/dL (ref 6.5–8.1)

## 2024-03-24 LAB — TOTAL PROTEIN, URINE DIPSTICK: Protein, ur: 30 mg/dL — AB

## 2024-03-24 NOTE — Progress Notes (Signed)
 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  INTERVAL HISTORY: Patient returns to clinic today for further evaluation, laboratory work, and discussion as when to reinitiate FOLFOX.  She continues to have weakness and fatigue, but otherwise feels well.  She has a poor appetite secondary to the taste of food, but states this is improving as well.  She denies any pain.  She denies any recent fevers or illnesses.  She has no neurologic complaints.  She has no chest pain, shortness of breath, cough, or hemoptysis.  She denies any nausea, vomiting, constipation, or diarrhea.  She has no melena or hematochezia.  She has no urinary complaints.  Patient offers no further specific complaints today.  REVIEW OF SYSTEMS:   Review of Systems  Constitutional:  Positive for malaise/fatigue. Negative for fever and weight loss.  Respiratory: Negative.  Negative for cough, hemoptysis and shortness of breath.   Cardiovascular: Negative.  Negative for chest pain and leg swelling.  Gastrointestinal: Negative.  Negative for abdominal pain, blood in stool, constipation, diarrhea, melena, nausea and vomiting.  Genitourinary: Negative.  Negative for dysuria.  Musculoskeletal: Negative.  Negative for back pain.  Neurological:  Positive for weakness. Negative for dizziness, focal weakness and headaches.  Psychiatric/Behavioral: Negative.  The patient is not nervous/anxious.     As per HPI. Otherwise, a complete review of systems is negative.  PAST MEDICAL HISTORY: Past Medical History:  Diagnosis Date   Abnormal uterine bleeding    Anemia     Anxiety    Asthma    Cardiomegaly    Cardiovascular disease    COPD (chronic obstructive pulmonary disease) (HCC)    Depression    Diabetes mellitus without complication (HCC)    Dyspnea    Goiter, nontoxic, multinodular    Heart murmur    Hypertension    Hypothyroidism    Lung cancer, primary, with metastasis from lung to other site [facility])    Malignant neoplasm of ascending colon (HCC)    Primary adenocarcinoma of ascending colon (HCC)    Syncopal episodes    Thyrotoxicosis    Uterine leiomyoma     PAST SURGICAL HISTORY: Past Surgical History:  Procedure Laterality Date   BIOPSY  08/12/2023   Procedure: BIOPSY;  Surgeon: Corky Diener, Alphonsus Jeans, MD;  Location: [facility] ENDOSCOPY;  Service: Gastroenterology;;   CESAREAN SECTION     COLONOSCOPY N/A 08/12/2023   Procedure: COLONOSCOPY;  Surgeon: Toledo, Alphonsus Jeans, MD;  Location: [facility] ENDOSCOPY;  Service: Gastroenterology;  Laterality: N/A;  3rd patient   COLONOSCOPY WITH PROPOFOL  N/A 10/29/2015   Procedure: COLONOSCOPY WITH PROPOFOL ;  Surgeon: Luella Sager, MD;  Location: [facility] ENDOSCOPY;  Service: Endoscopy;  Laterality: N/A;   DILITATION & CURRETTAGE/HYSTROSCOPY WITH NOVASURE ABLATION N/A 06/25/2018   Procedure: DILATATION & CURETTAGE/HYSTEROSCOPY WITH MINERVA ABLATION;  Surgeon: Teresa Fender, MD;  Location: [facility] ORS;  Service: Gynecology;  Laterality: N/A;   LAPAROTOMY N/A 02/07/2024   Procedure: LAPAROTOMY, EXPLORATORY SMALL BOWEL RESECTION HERNIA REPAIR;  Surgeon: Alben Alma, MD;  Location: [facility] ORS;  Service: General;  Laterality: N/A;   PORTACATH PLACEMENT N/A 08/26/2023   Procedure: INSERTION PORT-A-CATH;  Surgeon: Eldred Grego, MD;  Location:  [facility] ORS;  Service: General;  Laterality: N/A;   SUBMUCOSAL TATTOO INJECTION  08/12/2023   Procedure: SUBMUCOSAL TATTOO INJECTION;  Surgeon: Toledo, Alphonsus Jeans, MD;  Location: [facility] ENDOSCOPY;  Service: Gastroenterology;;   TUBAL LIGATION      FAMILY HISTORY: Family History   Adopted: Yes  Problem Relation Age of Onset   Breast cancer Neg Hx     ADVANCED DIRECTIVES (Y/N):  N  HEALTH MAINTENANCE: Social History   Tobacco Use   Smoking status: Former    Current packs/day: 0.00    Types: Cigarettes    Quit date: 2024    Years since quitting: 1.3   Smokeless tobacco: Never  Vaping Use   Vaping status: Former  Substance Use Topics   Alcohol use: Never   Drug use: Never     Colonoscopy:  PAP:  Bone density:  Lipid panel:  No Known Allergies  Current Outpatient Medications  Medication Sig Dispense Refill   albuterol  (PROVENTIL  HFA;VENTOLIN  HFA) 108 (90 Base) MCG/ACT inhaler Inhale 2 puffs into the lungs every 6 (six) hours as needed (for exercise induced asthma).     atorvastatin  (LIPITOR) 10 MG tablet SMARTSIG:1 Tablet(s) By Mouth Every Evening     ezetimibe (ZETIA) 10 MG tablet Take 10 mg by mouth daily.     hydrALAZINE  (APRESOLINE ) 10 MG tablet Take 10 mg by mouth 3 (three) times daily as needed.     levothyroxine  (SYNTHROID ) 100 MCG tablet Take 100 mcg by mouth daily.     lidocaine -prilocaine  (EMLA ) cream Apply to affected area once 30 g 3   metFORMIN (GLUCOPHAGE) 500 MG tablet Take 500 mg by mouth 2 (two) times daily.      metoprolol tartrate (LOPRESSOR) 25 MG tablet Take 25 mg by mouth 2 (two) times daily.     Multiple Vitamins-Minerals (ALIVE ONCE DAILY WOMENS PO) Take 2 tablets by mouth daily. Gummies     ondansetron  (ZOFRAN ) 8 MG tablet Take 1 tablet (8 mg total) by mouth every 8 (eight) hours as needed for nausea or vomiting. Start on the third day after chemotherapy. 60 tablet 1   prochlorperazine  (COMPAZINE ) 10 MG tablet Take 1 tablet (10 mg total) by mouth every 6 (six) hours as needed for nausea or vomiting. 60 tablet 1   pantoprazole  (PROTONIX ) 40 MG tablet Take 1 tablet (40 mg total) by mouth daily. (Patient not taking: Reported on 03/24/2024) 30 tablet 0   No current facility-administered medications for this visit.    Facility-Administered Medications Ordered in Other Visits  Medication Dose Route Frequency Provider Last Rate Last Admin   sodium chloride  flush (NS) 0.9 % injection 10 mL  10 mL Intracatheter PRN Shellie Dials, MD   10 mL at 12/09/23 1322    OBJECTIVE: Vitals:   03/24/24 1057  BP: (!) 155/92  Pulse: 60  Resp: 16  Temp: (!) 97.3 F (36.3 C)  SpO2: 100%       Body mass index is 25.6 kg/m.    ECOG FS:1 - Symptomatic but completely ambulatory  General: Well-developed, well-nourished, no acute distress. Eyes: Pink conjunctiva, anicteric sclera. HEENT: Normocephalic, moist mucous membranes. Lungs: No audible wheezing or coughing. Heart: Regular rate and rhythm. Abdomen: Soft, nontender, no obvious distention. Musculoskeletal: No edema, cyanosis, or clubbing. Neuro: Alert, answering all questions appropriately. Cranial nerves grossly intact. Skin: No rashes or petechiae noted. Psych: Normal affect.  LAB RESULTS:  Lab Results  Component Value Date   NA 140 03/02/2024   K 3.2 (L) 03/02/2024  CL 104 03/02/2024   CO2 26 03/02/2024   GLUCOSE 144 (H) 03/02/2024   BUN 8 03/02/2024   CREATININE 0.54 03/02/2024   CALCIUM  7.7 (L) 03/02/2024   PROT 7.4 03/02/2024   ALBUMIN  2.6 (L) 03/02/2024   AST 47 (H) 03/02/2024   ALT 15 03/02/2024   ALKPHOS 72 03/02/2024   BILITOT 0.7 03/02/2024   GFRNONAA >60 03/02/2024   GFRAA >60 06/09/2018    Lab Results  Component Value Date   WBC 4.4 03/24/2024   NEUTROABS 3.0 03/24/2024   HGB 9.0 (L) 03/24/2024   HCT 28.6 (L) 03/24/2024   MCV 93.2 03/24/2024   PLT 260 03/24/2024     STUDIES: No results found.    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  CT scan results from February 07, 2024 revealed her small bowel obstruction, but  interval decrease in size of hepatic metastatic lesions.  She did however see multiple subcentimeter lesions in the liver which are suspected metastatic, but unclear.  CEA had trended down from 1780-237, but now has trended up to 721.  Patient will require continuation of treatment with FOLFOX once she recovers from her surgery.  Will discontinue Avastin  altogether given her bowel perforation and obstruction.  Plan is to reimage with CT scan the last week of May and then return to clinic on Monday, April 11, 2024 to reinitiate FOLFOX.  Uterine masses: Patient reports she has a history of benign fibroids.  Appreciate gynecology input.  Monitor.   Anemia: Hemoglobin has trended down to 9.0.  Will get iron panel and ferritin with next lab draw. Leukopenia: Resolved.   Thrombocytopenia: Resolved. Hypokalemia: Resolved. Insomnia: Significantly improved.  Patient does not complain of this today. Coping/adjustment: Patient was previously given a referral to [facility]. Hypertension: Chronic and unchanged.  Discontinue Avastin  as above.  Continue monitoring and treatment per primary care.    Patient expressed understanding and was in agreement with this plan. She also understands that She can call clinic at any time with any questions, concerns, or complaints.    Cancer Staging  Primary adenocarcinoma of ascending colon [facility]) Staging form: Colon and Rectum, AJCC 8th Edition - Clinical stage from 08/17/2023: Stage IVB (cTX, cNX, pM1b) - Signed by Shellie Dials, MD on 08/17/2023 Stage prefix: Initial diagnosis   Shellie Dials, MD   03/24/2024 11:14 AM

## 2024-03-24 NOTE — Progress Notes (Signed)
 Would like something sent in for a yeast infection. Would also like hydrocodone  for pain in her lower abdomen.

## 2024-03-25 LAB — CEA: CEA: 1214 ng/mL — ABNORMAL HIGH (ref 0.0–4.7)

## 2024-03-30 ENCOUNTER — Encounter: Payer: Self-pay | Admitting: Oncology

## 2024-03-31 ENCOUNTER — Encounter: Payer: Self-pay | Admitting: Oncology

## 2024-04-01 ENCOUNTER — Inpatient Hospital Stay (HOSPITAL_BASED_OUTPATIENT_CLINIC_OR_DEPARTMENT_OTHER): Payer: Self-pay | Admitting: Hospice and Palliative Medicine

## 2024-04-01 DIAGNOSIS — C182 Malignant neoplasm of ascending colon: Secondary | ICD-10-CM

## 2024-04-01 NOTE — Progress Notes (Signed)
VM left. Will reschedule. 

## 2024-04-07 ENCOUNTER — Encounter: Payer: Self-pay | Admitting: Oncology

## 2024-04-08 ENCOUNTER — Encounter: Payer: Self-pay | Admitting: Oncology

## 2024-04-11 ENCOUNTER — Encounter: Payer: Self-pay | Admitting: Oncology

## 2024-04-11 ENCOUNTER — Inpatient Hospital Stay (HOSPITAL_BASED_OUTPATIENT_CLINIC_OR_DEPARTMENT_OTHER): Payer: Self-pay | Admitting: Oncology

## 2024-04-11 ENCOUNTER — Inpatient Hospital Stay: Payer: Self-pay

## 2024-04-11 ENCOUNTER — Inpatient Hospital Stay: Payer: Self-pay | Attending: Oncology

## 2024-04-11 VITALS — BP 127/81 | HR 60 | Temp 97.1°F | Resp 16 | Ht 66.5 in | Wt 161.9 lb

## 2024-04-11 DIAGNOSIS — Z87891 Personal history of nicotine dependence: Secondary | ICD-10-CM | POA: Insufficient documentation

## 2024-04-11 DIAGNOSIS — R7401 Elevation of levels of liver transaminase levels: Secondary | ICD-10-CM | POA: Diagnosis not present

## 2024-04-11 DIAGNOSIS — D259 Leiomyoma of uterus, unspecified: Secondary | ICD-10-CM | POA: Diagnosis not present

## 2024-04-11 DIAGNOSIS — E876 Hypokalemia: Secondary | ICD-10-CM | POA: Diagnosis not present

## 2024-04-11 DIAGNOSIS — G4700 Insomnia, unspecified: Secondary | ICD-10-CM | POA: Diagnosis not present

## 2024-04-11 DIAGNOSIS — D649 Anemia, unspecified: Secondary | ICD-10-CM | POA: Diagnosis not present

## 2024-04-11 DIAGNOSIS — C787 Secondary malignant neoplasm of liver and intrahepatic bile duct: Secondary | ICD-10-CM | POA: Diagnosis not present

## 2024-04-11 DIAGNOSIS — C7800 Secondary malignant neoplasm of unspecified lung: Secondary | ICD-10-CM | POA: Insufficient documentation

## 2024-04-11 DIAGNOSIS — I10 Essential (primary) hypertension: Secondary | ICD-10-CM

## 2024-04-11 DIAGNOSIS — C182 Malignant neoplasm of ascending colon: Secondary | ICD-10-CM | POA: Insufficient documentation

## 2024-04-11 DIAGNOSIS — R197 Diarrhea, unspecified: Secondary | ICD-10-CM | POA: Diagnosis not present

## 2024-04-11 DIAGNOSIS — Z5111 Encounter for antineoplastic chemotherapy: Secondary | ICD-10-CM | POA: Insufficient documentation

## 2024-04-11 LAB — CMP (CANCER CENTER ONLY)
ALT: 25 U/L (ref 0–44)
AST: 84 U/L — ABNORMAL HIGH (ref 15–41)
Albumin: 2.4 g/dL — ABNORMAL LOW (ref 3.5–5.0)
Alkaline Phosphatase: 189 U/L — ABNORMAL HIGH (ref 38–126)
Anion gap: 12 (ref 5–15)
BUN: 9 mg/dL (ref 6–20)
CO2: 22 mmol/L (ref 22–32)
Calcium: 7.8 mg/dL — ABNORMAL LOW (ref 8.9–10.3)
Chloride: 100 mmol/L (ref 98–111)
Creatinine: 0.61 mg/dL (ref 0.44–1.00)
GFR, Estimated: >60 mL/min (ref >60)
Glucose, Bld: 137 mg/dL — ABNORMAL HIGH (ref 70–99)
Potassium: 3.1 mmol/L — ABNORMAL LOW (ref 3.5–5.1)
Sodium: 134 mmol/L — ABNORMAL LOW (ref 135–145)
Total Bilirubin: 0.8 mg/dL (ref 0.0–1.2)
Total Protein: 7.4 g/dL (ref 6.5–8.1)

## 2024-04-11 LAB — CBC WITH DIFFERENTIAL (CANCER CENTER ONLY)
Abs Immature Granulocytes: 0.03 10*3/uL (ref 0.00–0.07)
Basophils Absolute: 0 10*3/uL (ref 0.0–0.1)
Basophils Relative: 1 %
Eosinophils Absolute: 0.2 10*3/uL (ref 0.0–0.5)
Eosinophils Relative: 2 %
HCT: 29.5 % — ABNORMAL LOW (ref 36.0–46.0)
Hemoglobin: 9.4 g/dL — ABNORMAL LOW (ref 12.0–15.0)
Immature Granulocytes: 1 %
Lymphocytes Relative: 10 %
Lymphs Abs: 0.6 10*3/uL — ABNORMAL LOW (ref 0.7–4.0)
MCH: 28.5 pg (ref 26.0–34.0)
MCHC: 31.9 g/dL (ref 30.0–36.0)
MCV: 89.4 fL (ref 80.0–100.0)
Monocytes Absolute: 0.4 10*3/uL (ref 0.1–1.0)
Monocytes Relative: 6 %
Neutro Abs: 5.2 10*3/uL (ref 1.7–7.7)
Neutrophils Relative %: 80 %
Platelet Count: 331 10*3/uL (ref 150–400)
RBC: 3.3 MIL/uL — ABNORMAL LOW (ref 3.87–5.11)
RDW: 16.3 % — ABNORMAL HIGH (ref 11.5–15.5)
WBC Count: 6.5 10*3/uL (ref 4.0–10.5)
nRBC: 0 % (ref 0.0–0.2)

## 2024-04-11 MED ORDER — PALONOSETRON HCL INJECTION 0.25 MG/5ML
0.2500 mg | Freq: Once | INTRAVENOUS | Status: AC
  Administered 2024-04-11: 0.25 mg via INTRAVENOUS
  Filled 2024-04-11: qty 5

## 2024-04-11 MED ORDER — POTASSIUM CHLORIDE CRYS ER 20 MEQ PO TBCR: 20.0000 meq | EXTENDED_RELEASE_TABLET | Freq: Every day | ORAL | 1 refills | Status: DC

## 2024-04-11 MED ORDER — SODIUM CHLORIDE 0.9 % IV SOLN
2400.0000 mg/m2 | INTRAVENOUS | Status: DC
  Administered 2024-04-11: 4300 mg via INTRAVENOUS
  Filled 2024-04-11: qty 86

## 2024-04-11 MED ORDER — FLUOROURACIL CHEMO INJECTION 2.5 GM/50ML
400.0000 mg/m2 | Freq: Once | INTRAVENOUS | Status: AC
  Administered 2024-04-11: 700 mg via INTRAVENOUS
  Filled 2024-04-11: qty 14

## 2024-04-11 MED ORDER — DEXAMETHASONE SODIUM PHOSPHATE 10 MG/ML IJ SOLN
10.0000 mg | Freq: Once | INTRAMUSCULAR | Status: AC
  Administered 2024-04-11: 10 mg via INTRAVENOUS
  Filled 2024-04-11: qty 1

## 2024-04-11 MED ORDER — DEXTROSE 5 % IV SOLN
Freq: Once | INTRAVENOUS | Status: AC
  Filled 2024-04-11: qty 250

## 2024-04-11 MED ORDER — OXALIPLATIN CHEMO INJECTION 100 MG/20ML
85.0000 mg/m2 | Freq: Once | INTRAVENOUS | Status: AC
  Administered 2024-04-11: 150 mg via INTRAVENOUS
  Filled 2024-04-11: qty 25.61

## 2024-04-11 MED ORDER — LEUCOVORIN CALCIUM INJECTION 350 MG
400.0000 mg/m2 | Freq: Once | INTRAVENOUS | Status: AC
  Administered 2024-04-11: 716 mg via INTRAVENOUS
  Filled 2024-04-11: qty 18.3

## 2024-04-11 NOTE — Progress Notes (Signed)
 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  INTERVAL HISTORY: Returns to clinic today for further evaluation and reinitiation of FOLFOX.  Her weakness and fatigue have improved.  He currently feels well.  She denies any pain.  She denies any recent fevers or illnesses.  She has no neurologic complaints.  She has no chest pain, shortness of breath, cough, or hemoptysis.  She denies any nausea, vomiting, constipation, or diarrhea.  She has no melena or hematochezia.  She has no urinary complaints.  Patient offers no further specific complaints today.  REVIEW OF SYSTEMS:   Review of Systems  Constitutional:  Positive for malaise/fatigue. Negative for fever and weight loss.  Respiratory: Negative.  Negative for cough, hemoptysis and shortness of breath.   Cardiovascular: Negative.  Negative for chest pain and leg swelling.  Gastrointestinal: Negative.  Negative for abdominal pain, blood in stool, constipation, diarrhea, melena, nausea and vomiting.  Genitourinary: Negative.  Negative for dysuria.  Musculoskeletal: Negative.  Negative for back pain.  Neurological:  Positive for weakness. Negative for dizziness, focal weakness and headaches.  Psychiatric/Behavioral: Negative.  The patient is not nervous/anxious.     As per HPI. Otherwise, a complete review of systems is negative.  PAST MEDICAL HISTORY: Past Medical History:  Diagnosis Date   Abnormal uterine bleeding    Anemia    Anxiety    Asthma    Cardiomegaly    Cardiovascular disease    COPD (chronic obstructive pulmonary disease) (HCC)    Depression     Diabetes mellitus without complication (HCC)    Dyspnea    Goiter, nontoxic, multinodular    Heart murmur    Hypertension    Hypothyroidism    Lung cancer, primary, with metastasis from lung to other site [facility])    Malignant neoplasm of ascending colon (HCC)    Primary adenocarcinoma of ascending colon (HCC)    Syncopal episodes    Thyrotoxicosis    Uterine leiomyoma     PAST SURGICAL HISTORY: Past Surgical History:  Procedure Laterality Date   BIOPSY  08/12/2023   Procedure: BIOPSY;  Surgeon: Corky Diener, Alphonsus Jeans, MD;  Location: [facility] ENDOSCOPY;  Service: Gastroenterology;;   CESAREAN SECTION     COLONOSCOPY N/A 08/12/2023   Procedure: COLONOSCOPY;  Surgeon: Toledo, Alphonsus Jeans, MD;  Location: [facility] ENDOSCOPY;  Service: Gastroenterology;  Laterality: N/A;  3rd patient   COLONOSCOPY WITH PROPOFOL  N/A 10/29/2015   Procedure: COLONOSCOPY WITH PROPOFOL ;  Surgeon: Luella Sager, MD;  Location: [facility] ENDOSCOPY;  Service: Endoscopy;  Laterality: N/A;   DILITATION & CURRETTAGE/HYSTROSCOPY WITH NOVASURE ABLATION N/A 06/25/2018   Procedure: DILATATION & CURETTAGE/HYSTEROSCOPY WITH MINERVA ABLATION;  Surgeon: Teresa Fender, MD;  Location: [facility] ORS;  Service: Gynecology;  Laterality: N/A;   LAPAROTOMY N/A 02/07/2024   Procedure: LAPAROTOMY, EXPLORATORY SMALL BOWEL RESECTION HERNIA REPAIR;  Surgeon: Alben Alma, MD;  Location: [facility] ORS;  Service: General;  Laterality: N/A;   PORTACATH PLACEMENT N/A 08/26/2023   Procedure: INSERTION PORT-A-CATH;  Surgeon: Eldred Grego, MD;  Location: [facility] ORS;  Service: General;  Laterality: N/A;   SUBMUCOSAL TATTOO INJECTION  08/12/2023   Procedure: SUBMUCOSAL TATTOO INJECTION;  Surgeon: [place], Teodoro  K, MD;  Location: [facility] ENDOSCOPY;  Service: Gastroenterology;;   TUBAL LIGATION      FAMILY HISTORY: Family History  Adopted: Yes  Problem Relation Age of Onset   Breast cancer Neg Hx     ADVANCED DIRECTIVES (Y/N):  N  HEALTH  MAINTENANCE: Social History   Tobacco Use   Smoking status: Former    Current packs/day: 0.00    Types: Cigarettes    Quit date: 2024    Years since quitting: 1.4   Smokeless tobacco: Never  Vaping Use   Vaping status: Former  Substance Use Topics   Alcohol use: Never   Drug use: Never     Colonoscopy:  PAP:  Bone density:  Lipid panel:  No Known Allergies  Current Outpatient Medications  Medication Sig Dispense Refill   albuterol  (PROVENTIL  HFA;VENTOLIN  HFA) 108 (90 Base) MCG/ACT inhaler Inhale 2 puffs into the lungs every 6 (six) hours as needed (for exercise induced asthma).     atorvastatin  (LIPITOR) 10 MG tablet SMARTSIG:1 Tablet(s) By Mouth Every Evening     ezetimibe (ZETIA) 10 MG tablet Take 10 mg by mouth daily.     hydrALAZINE  (APRESOLINE ) 10 MG tablet Take 10 mg by mouth 3 (three) times daily as needed.     levothyroxine  (SYNTHROID ) 100 MCG tablet Take 100 mcg by mouth daily.     lidocaine -prilocaine  (EMLA ) cream Apply to affected area once 30 g 3   metFORMIN (GLUCOPHAGE) 500 MG tablet Take 500 mg by mouth 2 (two) times daily.      metoprolol tartrate (LOPRESSOR) 25 MG tablet Take 25 mg by mouth 2 (two) times daily.     Multiple Vitamins-Minerals (ALIVE ONCE DAILY WOMENS PO) Take 2 tablets by mouth daily. Gummies     ondansetron  (ZOFRAN ) 8 MG tablet Take 1 tablet (8 mg total) by mouth every 8 (eight) hours as needed for nausea or vomiting. Start on the third day after chemotherapy. 60 tablet 1   potassium chloride  SA (KLOR-CON  M) 20 MEQ tablet Take 1 tablet (20 mEq total) by mouth daily. 30 tablet 1   prochlorperazine  (COMPAZINE ) 10 MG tablet Take 1 tablet (10 mg total) by mouth every 6 (six) hours as needed for nausea or vomiting. 60 tablet 1   pantoprazole  (PROTONIX ) 40 MG tablet Take 1 tablet (40 mg total) by mouth daily. (Patient not taking: Reported on 04/11/2024) 30 tablet 0   No current facility-administered medications for this visit.   Facility-Administered  Medications Ordered in Other Visits  Medication Dose Route Frequency Provider Last Rate Last Admin   fluorouracil  (ADRUCIL ) 4,300 mg in sodium chloride  0.9 % 64 mL chemo infusion  2,400 mg/m2 (Treatment Plan Recorded) Intravenous 1 day or 1 dose Yeira Gulden J, MD       fluorouracil  (ADRUCIL ) chemo injection 700 mg  400 mg/m2 (Treatment Plan Recorded) Intravenous Once Jenevie Casstevens J, MD       leucovorin  716 mg in dextrose  5 % 250 mL infusion  400 mg/m2 (Treatment Plan Recorded) Intravenous Once Marionette Meskill J, MD 143 mL/hr at 04/11/24 1051 716 mg at 04/11/24 1051   oxaliplatin  (ELOXATIN ) 150 mg in dextrose  5 % 500 mL chemo infusion  85 mg/m2 (Treatment Plan Recorded) Intravenous Once Belinda Bringhurst J, MD 265 mL/hr at 04/11/24 1053 150 mg at 04/11/24 1053   sodium chloride  flush (NS) 0.9 % injection 10 mL  10 mL Intracatheter PRN Shellie Dials, MD   10 mL at 12/09/23 1322    OBJECTIVE: Vitals:  04/11/24 0928  BP: 127/81  Pulse: 60  Resp: 16  Temp: (!) 97.1 F (36.2 C)  SpO2: 100%       Body mass index is 25.74 kg/m.    ECOG FS:0 - Asymptomatic  General: Well-developed, well-nourished, no acute distress. Eyes: Pink conjunctiva, anicteric sclera. HEENT: Normocephalic, moist mucous membranes. Lungs: No audible wheezing or coughing. Heart: Regular rate and rhythm. Abdomen: Soft, nontender, no obvious distention. Musculoskeletal: No edema, cyanosis, or clubbing. Neuro: Alert, answering all questions appropriately. Cranial nerves grossly intact. Skin: No rashes or petechiae noted. Psych: Normal affect.  LAB RESULTS:  Lab Results  Component Value Date   NA 134 (L) 04/11/2024   K 3.1 (L) 04/11/2024   CL 100 04/11/2024   CO2 22 04/11/2024   GLUCOSE 137 (H) 04/11/2024   BUN 9 04/11/2024   CREATININE 0.61 04/11/2024   CALCIUM  7.8 (L) 04/11/2024   PROT 7.4 04/11/2024   ALBUMIN  2.4 (L) 04/11/2024   AST 84 (H) 04/11/2024   ALT 25 04/11/2024   ALKPHOS 189  (H) 04/11/2024   BILITOT 0.8 04/11/2024   GFRNONAA >60 04/11/2024   GFRAA >60 06/09/2018    Lab Results  Component Value Date   WBC 6.5 04/11/2024   NEUTROABS 5.2 04/11/2024   HGB 9.4 (L) 04/11/2024   HCT 29.5 (L) 04/11/2024   MCV 89.4 04/11/2024   PLT 331 04/11/2024     STUDIES: No results found.    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  CT scan results from February 07, 2024 revealed her small bowel obstruction, but interval decrease in size of hepatic metastatic lesions.  She did however see multiple subcentimeter lesions in the liver which are suspected metastatic, but unclear.  CEA had trended down from 1780-237, but now has trended up to 1214.  Proceed with FOLFOX today.  Will discontinue Avastin  altogether given her bowel perforation and obstruction.  Her reimaging CT scan is scheduled for tomorrow.  Return to clinic in 2 days for pump removal and then in 2 weeks for further evaluation and continuation of treatment. Uterine masses: Patient reports she has a history of benign fibroids.  Appreciate gynecology input.  Monitor.   Anemia: Hemoglobin improved to 9.4.  Monitor.   Hypokalemia: Patient given a prescription for oral potassium supplementation today. Insomnia: Significantly improved.  Patient does not complain of this today. Coping/adjustment: Patient was previously given a referral to [facility]. Hypertension: Blood pressure is well-controlled today.  Continue monitoring and treatment per primary care.    Patient expressed understanding and was in agreement with this plan. She also understands that She can call clinic at any time with any questions, concerns, or complaints.    Cancer Staging  Primary adenocarcinoma of ascending colon [facility]) Staging form: Colon and Rectum,  AJCC 8th Edition - Clinical stage from 08/17/2023: Stage IVB (cTX, cNX, pM1b) - Signed by Shellie Dials, MD on 08/17/2023 Stage prefix: Initial diagnosis   Shellie Dials, MD   04/11/2024 12:27 PM

## 2024-04-11 NOTE — Patient Instructions (Signed)
 [facility] - A DEPT OF [facility]  Discharge Instructions: Thank you for choosing [facility] to provide your oncology and hematology care.  If you have a lab appointment with the Cancer Center, please go directly to the Cancer Center and check in at the registration area.  Wear comfortable clothing and clothing appropriate for easy access to any Portacath or PICC line.   We strive to give you quality time with your provider. You may need to reschedule your appointment if you arrive late (15 or more minutes).  Arriving late affects you and other patients whose appointments are after yours.  Also, if you miss three or more appointments without notifying the office, you may be dismissed from the clinic at the provider's discretion.      For prescription refill requests, have your pharmacy contact our office and allow 72 hours for refills to be completed.    Today you received the following chemotherapy and/or immunotherapy agents Oxaliplatin, Leucovorin & Adrucil      To help prevent nausea and vomiting after your treatment, we encourage you to take your nausea medication as directed.  BELOW ARE SYMPTOMS THAT SHOULD BE REPORTED IMMEDIATELY: *FEVER GREATER THAN 100.4 F (38 C) OR HIGHER *CHILLS OR SWEATING *NAUSEA AND VOMITING THAT IS NOT CONTROLLED WITH YOUR NAUSEA MEDICATION *UNUSUAL SHORTNESS OF BREATH *UNUSUAL BRUISING OR BLEEDING *URINARY PROBLEMS (pain or burning when urinating, or frequent urination) *BOWEL PROBLEMS (unusual diarrhea, constipation, pain near the anus) TENDERNESS IN MOUTH AND THROAT WITH OR WITHOUT PRESENCE OF ULCERS (sore throat, sores in mouth, or a toothache) UNUSUAL RASH, SWELLING OR PAIN  UNUSUAL VAGINAL DISCHARGE OR ITCHING   Items with * indicate a potential emergency and should be followed up as soon as possible or go to the Emergency Department if any problems should occur.  Please show the CHEMOTHERAPY ALERT  CARD or IMMUNOTHERAPY ALERT CARD at check-in to the Emergency Department and triage nurse.  Should you have questions after your visit or need to cancel or reschedule your appointment, please contact [facility] - A DEPT OF [facility]  [phone] and follow the prompts.  Office hours are 8:00 a.m. to 4:30 p.m. Monday - Friday. Please note that voicemails left after 4:00 p.m. may not be returned until the following business day.  We are closed weekends and major holidays. You have access to a nurse at all times for urgent questions. Please call the main number to the clinic [phone] and follow the prompts.  For any non-urgent questions, you may also contact your provider using MyChart. We now offer e-Visits for anyone 9 and older to request care online for non-urgent symptoms. For details visit [email or web address].   Also download the MyChart app! Go to the app store, search "MyChart", open the app, select [place], and log in with your MyChart username and password.

## 2024-04-12 ENCOUNTER — Ambulatory Visit: Admission: RE | Admit: 2024-04-12 | Payer: Self-pay | Source: Ambulatory Visit

## 2024-04-12 LAB — CEA: CEA: 1969 ng/mL — ABNORMAL HIGH (ref 0.0–4.7)

## 2024-04-13 ENCOUNTER — Inpatient Hospital Stay: Payer: Self-pay

## 2024-04-13 VITALS — BP 143/83 | HR 71 | Temp 98.0°F | Resp 18

## 2024-04-13 DIAGNOSIS — C182 Malignant neoplasm of ascending colon: Secondary | ICD-10-CM

## 2024-04-13 DIAGNOSIS — Z5111 Encounter for antineoplastic chemotherapy: Secondary | ICD-10-CM | POA: Diagnosis not present

## 2024-04-13 MED ORDER — HEPARIN SOD (PORK) LOCK FLUSH 100 UNIT/ML IV SOLN
500.0000 [IU] | Freq: Once | INTRAVENOUS | Status: AC | PRN
  Administered 2024-04-13: 500 [IU]
  Filled 2024-04-13: qty 5

## 2024-04-13 MED ORDER — SODIUM CHLORIDE 0.9% FLUSH
10.0000 mL | INTRAVENOUS | Status: DC | PRN
  Administered 2024-04-13: 10 mL
  Filled 2024-04-13: qty 10

## 2024-04-20 ENCOUNTER — Encounter: Payer: Self-pay | Admitting: Oncology

## 2024-04-25 ENCOUNTER — Inpatient Hospital Stay (HOSPITAL_BASED_OUTPATIENT_CLINIC_OR_DEPARTMENT_OTHER): Payer: Self-pay | Admitting: Oncology

## 2024-04-25 ENCOUNTER — Inpatient Hospital Stay: Payer: Self-pay

## 2024-04-25 ENCOUNTER — Encounter: Payer: Self-pay | Admitting: Oncology

## 2024-04-25 VITALS — BP 125/77 | HR 60 | Temp 96.8°F | Resp 16 | Ht 66.5 in | Wt 157.6 lb

## 2024-04-25 VITALS — BP 130/75 | HR 56 | Resp 16

## 2024-04-25 DIAGNOSIS — R197 Diarrhea, unspecified: Secondary | ICD-10-CM

## 2024-04-25 DIAGNOSIS — C787 Secondary malignant neoplasm of liver and intrahepatic bile duct: Secondary | ICD-10-CM

## 2024-04-25 DIAGNOSIS — E876 Hypokalemia: Secondary | ICD-10-CM

## 2024-04-25 DIAGNOSIS — C7800 Secondary malignant neoplasm of unspecified lung: Secondary | ICD-10-CM

## 2024-04-25 DIAGNOSIS — C182 Malignant neoplasm of ascending colon: Secondary | ICD-10-CM

## 2024-04-25 DIAGNOSIS — G4700 Insomnia, unspecified: Secondary | ICD-10-CM

## 2024-04-25 DIAGNOSIS — D649 Anemia, unspecified: Secondary | ICD-10-CM

## 2024-04-25 DIAGNOSIS — D259 Leiomyoma of uterus, unspecified: Secondary | ICD-10-CM

## 2024-04-25 DIAGNOSIS — Z87891 Personal history of nicotine dependence: Secondary | ICD-10-CM

## 2024-04-25 DIAGNOSIS — Z5111 Encounter for antineoplastic chemotherapy: Secondary | ICD-10-CM | POA: Diagnosis not present

## 2024-04-25 LAB — CBC WITH DIFFERENTIAL (CANCER CENTER ONLY)
Abs Immature Granulocytes: 0.02 10*3/uL (ref 0.00–0.07)
Basophils Absolute: 0 10*3/uL (ref 0.0–0.1)
Basophils Relative: 1 %
Eosinophils Absolute: 0.1 10*3/uL (ref 0.0–0.5)
Eosinophils Relative: 3 %
HCT: 27.4 % — ABNORMAL LOW (ref 36.0–46.0)
Hemoglobin: 8.9 g/dL — ABNORMAL LOW (ref 12.0–15.0)
Immature Granulocytes: 1 %
Lymphocytes Relative: 14 %
Lymphs Abs: 0.5 10*3/uL — ABNORMAL LOW (ref 0.7–4.0)
MCH: 28.3 pg (ref 26.0–34.0)
MCHC: 32.5 g/dL (ref 30.0–36.0)
MCV: 87 fL (ref 80.0–100.0)
Monocytes Absolute: 0.4 10*3/uL (ref 0.1–1.0)
Monocytes Relative: 11 %
Neutro Abs: 2.8 10*3/uL (ref 1.7–7.7)
Neutrophils Relative %: 70 %
Platelet Count: 285 10*3/uL (ref 150–400)
RBC: 3.15 MIL/uL — ABNORMAL LOW (ref 3.87–5.11)
RDW: 16.8 % — ABNORMAL HIGH (ref 11.5–15.5)
WBC Count: 3.9 10*3/uL — ABNORMAL LOW (ref 4.0–10.5)
nRBC: 0 % (ref 0.0–0.2)

## 2024-04-25 LAB — CMP (CANCER CENTER ONLY)
ALT: 21 U/L (ref 0–44)
AST: 96 U/L — ABNORMAL HIGH (ref 15–41)
Albumin: 2.5 g/dL — ABNORMAL LOW (ref 3.5–5.0)
Alkaline Phosphatase: 220 U/L — ABNORMAL HIGH (ref 38–126)
Anion gap: 9 (ref 5–15)
BUN: 8 mg/dL (ref 6–20)
CO2: 22 mmol/L (ref 22–32)
Calcium: 7.9 mg/dL — ABNORMAL LOW (ref 8.9–10.3)
Chloride: 105 mmol/L (ref 98–111)
Creatinine: 0.59 mg/dL (ref 0.44–1.00)
GFR, Estimated: >60 mL/min (ref >60)
Glucose, Bld: 90 mg/dL (ref 70–99)
Potassium: 3.1 mmol/L — ABNORMAL LOW (ref 3.5–5.1)
Sodium: 136 mmol/L (ref 135–145)
Total Bilirubin: 1.3 mg/dL — ABNORMAL HIGH (ref 0.0–1.2)
Total Protein: 6.9 g/dL (ref 6.5–8.1)

## 2024-04-25 MED ORDER — SODIUM CHLORIDE 0.9 % IV SOLN
Freq: Once | INTRAVENOUS | Status: DC
  Filled 2024-04-25: qty 250

## 2024-04-25 MED ORDER — FLUOROURACIL CHEMO INJECTION 2.5 GM/50ML
400.0000 mg/m2 | Freq: Once | INTRAVENOUS | Status: AC
  Administered 2024-04-25: 700 mg via INTRAVENOUS
  Filled 2024-04-25: qty 14

## 2024-04-25 MED ORDER — PALONOSETRON HCL INJECTION 0.25 MG/5ML
0.2500 mg | Freq: Once | INTRAVENOUS | Status: AC
  Administered 2024-04-25: 0.25 mg via INTRAVENOUS
  Filled 2024-04-25: qty 5

## 2024-04-25 MED ORDER — DEXAMETHASONE SODIUM PHOSPHATE 10 MG/ML IJ SOLN
10.0000 mg | Freq: Once | INTRAMUSCULAR | Status: AC
  Administered 2024-04-25: 10 mg via INTRAVENOUS
  Filled 2024-04-25: qty 1

## 2024-04-25 MED ORDER — OXALIPLATIN CHEMO INJECTION 100 MG/20ML
85.0000 mg/m2 | Freq: Once | INTRAVENOUS | Status: AC
  Administered 2024-04-25: 150 mg via INTRAVENOUS
  Filled 2024-04-25: qty 25.57

## 2024-04-25 MED ORDER — DEXTROSE 5 % IV SOLN
Freq: Once | INTRAVENOUS | Status: AC
  Filled 2024-04-25: qty 250

## 2024-04-25 MED ORDER — SODIUM CHLORIDE 0.9 % IV SOLN
2400.0000 mg/m2 | INTRAVENOUS | Status: DC
  Administered 2024-04-25: 4300 mg via INTRAVENOUS
  Filled 2024-04-25: qty 86

## 2024-04-25 MED ORDER — LEUCOVORIN CALCIUM INJECTION 350 MG
400.0000 mg/m2 | Freq: Once | INTRAVENOUS | Status: AC
  Administered 2024-04-25: 716 mg via INTRAVENOUS
  Filled 2024-04-25: qty 25

## 2024-04-25 NOTE — Progress Notes (Signed)
 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  INTERVAL HISTORY: Patient returns to clinic today for further evaluation and consideration of cycle 14 of FOLFOX.  Patient had increased diarrhea since her last treatment which has improved, but not resolved.  She did not take any Imodium or seek medical help during this timeframe.  She has increased weakness and fatigue, but otherwise feels well.  She denies any pain.  She has no fevers.  She has no neurologic complaints.  She has no chest pain, shortness of breath, cough, or hemoptysis.  She denies any nausea, vomiting, or constipation.  She has no melena or hematochezia.  She has no urinary complaints.  Patient offers no further specific complaints today.  REVIEW OF SYSTEMS:   Review of Systems  Constitutional:  Positive for malaise/fatigue. Negative for fever and weight loss.  Respiratory: Negative.  Negative for cough, hemoptysis and shortness of breath.   Cardiovascular: Negative.  Negative for chest pain and leg swelling.  Gastrointestinal:  Positive for diarrhea. Negative for abdominal pain, blood in stool, constipation, melena, nausea and vomiting.  Genitourinary: Negative.  Negative for dysuria.  Musculoskeletal: Negative.  Negative for back pain.  Neurological:  Positive for weakness. Negative for dizziness, focal weakness and headaches.  Psychiatric/Behavioral: Negative.  The patient is not nervous/anxious.     As per HPI. Otherwise, a complete review of systems is negative.  PAST MEDICAL HISTORY: Past Medical History:  Diagnosis Date   Abnormal uterine bleeding     Anemia    Anxiety    Asthma    Cardiomegaly    Cardiovascular disease    COPD (chronic obstructive pulmonary disease) (HCC)    Depression    Diabetes mellitus without complication (HCC)    Dyspnea    Goiter, nontoxic, multinodular    Heart murmur    Hypertension    Hypothyroidism    Lung cancer, primary, with metastasis from lung to other site [facility])    Malignant neoplasm of ascending colon (HCC)    Primary adenocarcinoma of ascending colon (HCC)    Syncopal episodes    Thyrotoxicosis    Uterine leiomyoma     PAST SURGICAL HISTORY: Past Surgical History:  Procedure Laterality Date   BIOPSY  08/12/2023   Procedure: BIOPSY;  Surgeon: Corky Diener, Alphonsus Jeans, MD;  Location: [facility] ENDOSCOPY;  Service: Gastroenterology;;   CESAREAN SECTION     COLONOSCOPY N/A 08/12/2023   Procedure: COLONOSCOPY;  Surgeon: Toledo, Alphonsus Jeans, MD;  Location: [facility] ENDOSCOPY;  Service: Gastroenterology;  Laterality: N/A;  3rd patient   COLONOSCOPY WITH PROPOFOL  N/A 10/29/2015   Procedure: COLONOSCOPY WITH PROPOFOL ;  Surgeon: Luella Sager, MD;  Location: [facility] ENDOSCOPY;  Service: Endoscopy;  Laterality: N/A;   DILITATION & CURRETTAGE/HYSTROSCOPY WITH NOVASURE ABLATION N/A 06/25/2018   Procedure: DILATATION & CURETTAGE/HYSTEROSCOPY WITH MINERVA ABLATION;  Surgeon: Teresa Fender, MD;  Location: [facility] ORS;  Service: Gynecology;  Laterality: N/A;   LAPAROTOMY N/A 02/07/2024   Procedure: LAPAROTOMY, EXPLORATORY SMALL BOWEL RESECTION HERNIA REPAIR;  Surgeon: Alben Alma, MD;  Location: [facility] ORS;  Service: General;  Laterality: N/A;   PORTACATH PLACEMENT N/A 08/26/2023   Procedure: INSERTION PORT-A-CATH;  Surgeon: Dortha Gauss,  Elmo Haff, MD;  Location: [facility] ORS;  Service: General;  Laterality: N/A;   SUBMUCOSAL TATTOO INJECTION  08/12/2023   Procedure: SUBMUCOSAL TATTOO INJECTION;  Surgeon: Toledo, Alphonsus Jeans, MD;  Location: [facility] ENDOSCOPY;  Service: Gastroenterology;;   TUBAL LIGATION      FAMILY  HISTORY: Family History  Adopted: Yes  Problem Relation Age of Onset   Breast cancer Neg Hx     ADVANCED DIRECTIVES (Y/N):  N  HEALTH MAINTENANCE: Social History   Tobacco Use   Smoking status: Former    Current packs/day: 0.00    Types: Cigarettes    Quit date: 2024    Years since quitting: 1.4   Smokeless tobacco: Never  Vaping Use   Vaping status: Former  Substance Use Topics   Alcohol use: Never   Drug use: Never     Colonoscopy:  PAP:  Bone density:  Lipid panel:  No Known Allergies  Current Outpatient Medications  Medication Sig Dispense Refill   albuterol  (PROVENTIL  HFA;VENTOLIN  HFA) 108 (90 Base) MCG/ACT inhaler Inhale 2 puffs into the lungs every 6 (six) hours as needed (for exercise induced asthma).     atorvastatin  (LIPITOR) 10 MG tablet SMARTSIG:1 Tablet(s) By Mouth Every Evening     ezetimibe (ZETIA) 10 MG tablet Take 10 mg by mouth daily.     hydrALAZINE  (APRESOLINE ) 10 MG tablet Take 10 mg by mouth 3 (three) times daily as needed.     levothyroxine  (SYNTHROID ) 100 MCG tablet Take 100 mcg by mouth daily.     lidocaine -prilocaine  (EMLA ) cream Apply to affected area once 30 g 3   metFORMIN (GLUCOPHAGE) 500 MG tablet Take 500 mg by mouth 2 (two) times daily.      metoprolol tartrate (LOPRESSOR) 25 MG tablet Take 25 mg by mouth 2 (two) times daily.     Multiple Vitamins-Minerals (ALIVE ONCE DAILY WOMENS PO) Take 2 tablets by mouth daily. Gummies     ondansetron  (ZOFRAN ) 8 MG tablet Take 1 tablet (8 mg total) by mouth every 8 (eight) hours as needed for nausea or vomiting. Start on the third day after chemotherapy. 60 tablet 1   potassium chloride  SA (KLOR-CON  M) 20 MEQ tablet Take 1 tablet (20 mEq total) by mouth daily. 30 tablet 1   prochlorperazine  (COMPAZINE ) 10 MG tablet Take 1 tablet (10 mg total) by mouth every 6 (six) hours as needed for nausea or vomiting. 60 tablet 1   pantoprazole  (PROTONIX ) 40 MG tablet Take 1 tablet (40 mg total) by mouth daily.  (Patient not taking: Reported on 04/25/2024) 30 tablet 0   No current facility-administered medications for this visit.   Facility-Administered Medications Ordered in Other Visits  Medication Dose Route Frequency Provider Last Rate Last Admin   0.9 %  sodium chloride  infusion   Intravenous Once Chari Parmenter J, MD       fluorouracil  (ADRUCIL ) 4,300 mg in sodium chloride  0.9 % 64 mL chemo infusion  2,400 mg/m2 (Treatment Plan Recorded) Intravenous 1 day or 1 dose Hanna Ra J, MD   Infusion Verify at 04/25/24 1306   sodium chloride  flush (NS) 0.9 % injection 10 mL  10 mL Intracatheter PRN Shellie Dials, MD   10 mL at 12/09/23 1322    OBJECTIVE: Vitals:   04/25/24 0838  BP: 125/77  Pulse: 60  Resp: 16  Temp: (!) 96.8 F (36 C)  SpO2: 100%       Body mass index is 25.06 kg/m.    ECOG FS:0 - Asymptomatic  General: Well-developed, well-nourished, no acute distress. Eyes: Pink conjunctiva, anicteric sclera. HEENT: Normocephalic, moist mucous membranes. Lungs: No audible wheezing or coughing. Heart: Regular rate and rhythm. Abdomen: Soft, nontender, no obvious distention. Musculoskeletal: No edema, cyanosis, or clubbing. Neuro: Alert, answering all questions appropriately. Cranial nerves grossly intact. Skin: No rashes or petechiae noted. Psych: Normal affect.  LAB RESULTS:  Lab Results  Component Value Date   NA 136 04/25/2024   K 3.1 (L) 04/25/2024   CL 105 04/25/2024   CO2 22 04/25/2024   GLUCOSE 90 04/25/2024   BUN 8 04/25/2024   CREATININE 0.59 04/25/2024   CALCIUM  7.9 (L) 04/25/2024   PROT 6.9 04/25/2024   ALBUMIN  2.5 (L) 04/25/2024   AST 96 (H) 04/25/2024   ALT 21 04/25/2024   ALKPHOS 220 (H) 04/25/2024   BILITOT 1.3 (H) 04/25/2024   GFRNONAA >60 04/25/2024   GFRAA >60 06/09/2018    Lab Results  Component Value Date   WBC 3.9 (L) 04/25/2024   NEUTROABS 2.8 04/25/2024   HGB 8.9 (L) 04/25/2024   HCT 27.4 (L) 04/25/2024   MCV 87.0  04/25/2024   PLT 285 04/25/2024     STUDIES: No results found.    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  She did however see multiple subcentimeter lesions in the liver which are suspected metastatic, but unclear.  CEA had trended down from 1780-237, but now has trended up to 1969.0.  Patient reinitiated FOLFOX 2 weeks ago.  Despite ongoing diarrhea, will proceed with cycle 14 of FOLFOX today.  Avastin  has been discontinued altogether given her history of bowel perforation and obstruction.  Patient canceled her restaging CT, so will reschedule this in the next 1 to 2 weeks.  Return to clinic in 2 days for pump removal and then in 2 weeks for further evaluation and consideration of cycle 15.   Uterine masses: Patient reports she has a history of benign fibroids.  Appreciate gynecology input.  Monitor.   Anemia: Hemoglobin has trended down slightly to 8.9, monitor. Hypokalemia: Chronic and unchanged.  Continue oral potassium supplementation. Hyperbilirubinemia: Mild.  Patient's bilirubin is 1.3, monitor. Insomnia: Significantly improved.  Patient does not complain of this today. Coping/adjustment: Patient was previously given a referral to [facility]. Hypertension: Blood pressure is well-controlled today.  Continue monitoring and treatment per primary care.  Diarrhea: Likely secondary to treatment.  Patient has been instructed to have Imodium OTC on hand and begin treatment tonight.  Can consider Lomotil in the future if necessary.   Patient expressed understanding and was in agreement with this plan. She also understands that She can call clinic at any time with any questions, concerns, or complaints.    Cancer Staging  Primary  adenocarcinoma of ascending colon [facility]) Staging form: Colon and Rectum, AJCC 8th Edition - Clinical stage from 08/17/2023: Stage IVB (cTX, cNX, pM1b) - Signed by Shellie Dials, MD on 08/17/2023 Stage prefix: Initial diagnosis   Shellie Dials, MD   04/25/2024 3:11 PM

## 2024-04-26 LAB — CEA: CEA: 2204 ng/mL — ABNORMAL HIGH (ref 0.0–4.7)

## 2024-04-27 ENCOUNTER — Inpatient Hospital Stay: Payer: Self-pay

## 2024-04-27 VITALS — BP 138/81 | HR 79 | Temp 99.8°F | Resp 17

## 2024-04-27 DIAGNOSIS — C182 Malignant neoplasm of ascending colon: Secondary | ICD-10-CM

## 2024-04-27 DIAGNOSIS — Z5111 Encounter for antineoplastic chemotherapy: Secondary | ICD-10-CM | POA: Diagnosis not present

## 2024-04-27 MED ORDER — HEPARIN SOD (PORK) LOCK FLUSH 100 UNIT/ML IV SOLN
500.0000 [IU] | Freq: Once | INTRAVENOUS | Status: AC | PRN
  Administered 2024-04-27: 500 [IU]
  Filled 2024-04-27: qty 5

## 2024-04-27 MED ORDER — SODIUM CHLORIDE 0.9% FLUSH
10.0000 mL | INTRAVENOUS | Status: DC | PRN
Start: 2024-04-27 — End: 2024-04-27
  Administered 2024-04-27: 10 mL
  Filled 2024-04-27: qty 10

## 2024-04-28 ENCOUNTER — Ambulatory Visit: Payer: Self-pay | Attending: Oncology

## 2024-05-09 ENCOUNTER — Encounter: Payer: Self-pay | Admitting: Oncology

## 2024-05-09 ENCOUNTER — Inpatient Hospital Stay (HOSPITAL_BASED_OUTPATIENT_CLINIC_OR_DEPARTMENT_OTHER): Payer: Self-pay | Admitting: Oncology

## 2024-05-09 ENCOUNTER — Inpatient Hospital Stay: Payer: Self-pay

## 2024-05-09 ENCOUNTER — Other Ambulatory Visit: Payer: Self-pay

## 2024-05-09 VITALS — BP 135/75 | HR 64 | Temp 97.7°F | Resp 18 | Ht 66.5 in | Wt 157.0 lb

## 2024-05-09 VITALS — BP 126/73 | HR 62

## 2024-05-09 DIAGNOSIS — C182 Malignant neoplasm of ascending colon: Secondary | ICD-10-CM | POA: Diagnosis not present

## 2024-05-09 DIAGNOSIS — Z5111 Encounter for antineoplastic chemotherapy: Secondary | ICD-10-CM | POA: Diagnosis not present

## 2024-05-09 LAB — CBC WITH DIFFERENTIAL (CANCER CENTER ONLY)
Abs Immature Granulocytes: 0.01 10*3/uL (ref 0.00–0.07)
Basophils Absolute: 0.1 10*3/uL (ref 0.0–0.1)
Basophils Relative: 2 %
Eosinophils Absolute: 0.1 10*3/uL (ref 0.0–0.5)
Eosinophils Relative: 3 %
HCT: 27.9 % — ABNORMAL LOW (ref 36.0–46.0)
Hemoglobin: 9.1 g/dL — ABNORMAL LOW (ref 12.0–15.0)
Immature Granulocytes: 0 %
Lymphocytes Relative: 20 %
Lymphs Abs: 0.6 10*3/uL — ABNORMAL LOW (ref 0.7–4.0)
MCH: 27.7 pg (ref 26.0–34.0)
MCHC: 32.6 g/dL (ref 30.0–36.0)
MCV: 85.1 fL (ref 80.0–100.0)
Monocytes Absolute: 0.6 10*3/uL (ref 0.1–1.0)
Monocytes Relative: 20 %
Neutro Abs: 1.7 10*3/uL (ref 1.7–7.7)
Neutrophils Relative %: 55 %
Platelet Count: 236 10*3/uL (ref 150–400)
RBC: 3.28 MIL/uL — ABNORMAL LOW (ref 3.87–5.11)
RDW: 18.9 % — ABNORMAL HIGH (ref 11.5–15.5)
WBC Count: 3.1 10*3/uL — ABNORMAL LOW (ref 4.0–10.5)
nRBC: 0 % (ref 0.0–0.2)

## 2024-05-09 LAB — CMP (CANCER CENTER ONLY)
ALT: 23 U/L (ref 0–44)
AST: 158 U/L — ABNORMAL HIGH (ref 15–41)
Albumin: 2.3 g/dL — ABNORMAL LOW (ref 3.5–5.0)
Alkaline Phosphatase: 225 U/L — ABNORMAL HIGH (ref 38–126)
Anion gap: 8 (ref 5–15)
BUN: 8 mg/dL (ref 6–20)
CO2: 23 mmol/L (ref 22–32)
Calcium: 7.8 mg/dL — ABNORMAL LOW (ref 8.9–10.3)
Chloride: 105 mmol/L (ref 98–111)
Creatinine: 0.6 mg/dL (ref 0.44–1.00)
GFR, Estimated: >60 mL/min (ref >60)
Glucose, Bld: 85 mg/dL (ref 70–99)
Potassium: 3.3 mmol/L — ABNORMAL LOW (ref 3.5–5.1)
Sodium: 136 mmol/L (ref 135–145)
Total Bilirubin: 1.6 mg/dL — ABNORMAL HIGH (ref 0.0–1.2)
Total Protein: 6.8 g/dL (ref 6.5–8.1)

## 2024-05-09 LAB — MAGNESIUM: Magnesium: 1.6 mg/dL — ABNORMAL LOW (ref 1.7–2.4)

## 2024-05-09 MED ORDER — FLUOROURACIL CHEMO INJECTION 2.5 GM/50ML
400.0000 mg/m2 | Freq: Once | INTRAVENOUS | Status: AC
  Administered 2024-05-09: 700 mg via INTRAVENOUS
  Filled 2024-05-09: qty 14

## 2024-05-09 MED ORDER — DEXAMETHASONE SODIUM PHOSPHATE 10 MG/ML IJ SOLN
10.0000 mg | Freq: Once | INTRAMUSCULAR | Status: AC
  Administered 2024-05-09: 10 mg via INTRAVENOUS
  Filled 2024-05-09: qty 1

## 2024-05-09 MED ORDER — PALONOSETRON HCL INJECTION 0.25 MG/5ML
0.2500 mg | Freq: Once | INTRAVENOUS | Status: AC
  Administered 2024-05-09: 0.25 mg via INTRAVENOUS
  Filled 2024-05-09: qty 5

## 2024-05-09 MED ORDER — OXALIPLATIN CHEMO INJECTION 100 MG/20ML
85.0000 mg/m2 | Freq: Once | INTRAVENOUS | Status: AC
  Administered 2024-05-09: 150 mg via INTRAVENOUS
  Filled 2024-05-09: qty 24.93

## 2024-05-09 MED ORDER — LEUCOVORIN CALCIUM INJECTION 350 MG
400.0000 mg/m2 | Freq: Once | INTRAVENOUS | Status: AC
  Administered 2024-05-09: 716 mg via INTRAVENOUS
  Filled 2024-05-09: qty 18.3

## 2024-05-09 MED ORDER — SODIUM CHLORIDE 0.9 % IV SOLN
2400.0000 mg/m2 | INTRAVENOUS | Status: DC
  Administered 2024-05-09: 4300 mg via INTRAVENOUS
  Filled 2024-05-09: qty 86

## 2024-05-09 MED ORDER — DEXTROSE 5 % IV SOLN
Freq: Once | INTRAVENOUS | Status: AC
  Filled 2024-05-09: qty 250

## 2024-05-09 NOTE — Progress Notes (Signed)
 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  INTERVAL HISTORY: Patient returns to clinic today for further evaluation and consideration of cycle 15 of FOLFOX.  Her diarrhea is significantly improved with Imodium.  She is now having muscle cramps in her back of her legs that occasionally wake her up at night.  She once again canceled her CT scan and this has been rescheduled for next week.  She otherwise feels well.  She denies any recent fevers or illnesses.  She has no neurologic complaints.  She has no chest pain, shortness of breath, cough, or hemoptysis.  She denies any nausea, vomiting, or constipation.  She has no melena or hematochezia.  She has no urinary complaints.  Patient offers no further specific complaints today.  REVIEW OF SYSTEMS:   Review of Systems  Constitutional:  Positive for malaise/fatigue. Negative for fever and weight loss.  Respiratory: Negative.  Negative for cough, hemoptysis and shortness of breath.   Cardiovascular: Negative.  Negative for chest pain and leg swelling.  Gastrointestinal: Negative.  Negative for abdominal pain, blood in stool, constipation, diarrhea, melena, nausea and vomiting.  Genitourinary: Negative.  Negative for dysuria.  Musculoskeletal: Negative.  Negative for back pain.  Neurological:  Positive for weakness. Negative for dizziness, focal weakness and headaches.  Psychiatric/Behavioral: Negative.  The patient is not nervous/anxious.     As per HPI. Otherwise, a complete review of systems is negative.  PAST MEDICAL HISTORY: Past Medical History:  Diagnosis Date    Abnormal uterine bleeding    Anemia    Anxiety    Asthma    Cardiomegaly    Cardiovascular disease    COPD (chronic obstructive pulmonary disease) (HCC)    Depression    Diabetes mellitus without complication (HCC)    Dyspnea    Goiter, nontoxic, multinodular    Heart murmur    Hypertension    Hypothyroidism    Lung cancer, primary, with metastasis from lung to other site [facility])    Malignant neoplasm of ascending colon (HCC)    Primary adenocarcinoma of ascending colon (HCC)    Syncopal episodes    Thyrotoxicosis    Uterine leiomyoma     PAST SURGICAL HISTORY: Past Surgical History:  Procedure Laterality Date   BIOPSY  08/12/2023   Procedure: BIOPSY;  Surgeon: Aundria, Ladell POUR, MD;  Location: [facility] ENDOSCOPY;  Service: Gastroenterology;;   CESAREAN SECTION     COLONOSCOPY N/A 08/12/2023   Procedure: COLONOSCOPY;  Surgeon: Toledo, Ladell POUR, MD;  Location: [facility] ENDOSCOPY;  Service: Gastroenterology;  Laterality: N/A;  3rd patient   COLONOSCOPY WITH PROPOFOL  N/A 10/29/2015   Procedure: COLONOSCOPY WITH PROPOFOL ;  Surgeon: Donnice Vaughn Manes, MD;  Location: [facility] ENDOSCOPY;  Service: Endoscopy;  Laterality: N/A;   DILITATION & CURRETTAGE/HYSTROSCOPY WITH NOVASURE ABLATION N/A 06/25/2018   Procedure: DILATATION & CURETTAGE/HYSTEROSCOPY WITH MINERVA ABLATION;  Surgeon: Connell Davies, MD;  Location: [facility] ORS;  Service: Gynecology;  Laterality: N/A;   LAPAROTOMY N/A 02/07/2024   Procedure: LAPAROTOMY, EXPLORATORY SMALL BOWEL RESECTION HERNIA REPAIR;  Surgeon: Jordis Laneta JULIANNA, MD;  Location: [facility] ORS;  Service: General;  Laterality: N/A;   PORTACATH PLACEMENT N/A 08/26/2023  Procedure: INSERTION PORT-A-CATH;  Surgeon: Rodolph Romano, MD;  Location: [facility] ORS;  Service: General;  Laterality: N/A;   SUBMUCOSAL TATTOO INJECTION  08/12/2023   Procedure: SUBMUCOSAL TATTOO INJECTION;  Surgeon: Toledo, Ladell POUR, MD;  Location: [facility] ENDOSCOPY;  Service: Gastroenterology;;   TUBAL  LIGATION      FAMILY HISTORY: Family History  Adopted: Yes  Problem Relation Age of Onset   Breast cancer Neg Hx     ADVANCED DIRECTIVES (Y/N):  N  HEALTH MAINTENANCE: Social History   Tobacco Use   Smoking status: Former    Current packs/day: 0.00    Types: Cigarettes    Quit date: 2024    Years since quitting: 1.4   Smokeless tobacco: Never  Vaping Use   Vaping status: Former  Substance Use Topics   Alcohol use: Never   Drug use: Never     Colonoscopy:  PAP:  Bone density:  Lipid panel:  No Known Allergies  Current Outpatient Medications  Medication Sig Dispense Refill   albuterol  (PROVENTIL  HFA;VENTOLIN  HFA) 108 (90 Base) MCG/ACT inhaler Inhale 2 puffs into the lungs every 6 (six) hours as needed (for exercise induced asthma).     atorvastatin  (LIPITOR) 10 MG tablet SMARTSIG:1 Tablet(s) By Mouth Every Evening     ezetimibe (ZETIA) 10 MG tablet Take 10 mg by mouth daily.     hydrALAZINE  (APRESOLINE ) 10 MG tablet Take 10 mg by mouth 3 (three) times daily as needed.     levothyroxine  (SYNTHROID ) 100 MCG tablet Take 100 mcg by mouth daily.     lidocaine -prilocaine  (EMLA ) cream Apply to affected area once 30 g 3   metFORMIN (GLUCOPHAGE) 500 MG tablet Take 500 mg by mouth 2 (two) times daily.      metoprolol tartrate (LOPRESSOR) 25 MG tablet Take 25 mg by mouth 2 (two) times daily.     Multiple Vitamins-Minerals (ALIVE ONCE DAILY WOMENS PO) Take 2 tablets by mouth daily. Gummies     ondansetron  (ZOFRAN ) 8 MG tablet Take 1 tablet (8 mg total) by mouth every 8 (eight) hours as needed for nausea or vomiting. Start on the third day after chemotherapy. 60 tablet 1   potassium chloride  SA (KLOR-CON  M) 20 MEQ tablet Take 1 tablet (20 mEq total) by mouth daily. 30 tablet 1   prochlorperazine  (COMPAZINE ) 10 MG tablet Take 1 tablet (10 mg total) by mouth every 6 (six) hours as needed for nausea or vomiting. 60 tablet 1   pantoprazole  (PROTONIX ) 40 MG tablet Take 1 tablet (40 mg  total) by mouth daily. (Patient not taking: Reported on 05/09/2024) 30 tablet 0   No current facility-administered medications for this visit.   Facility-Administered Medications Ordered in Other Visits  Medication Dose Route Frequency Provider Last Rate Last Admin   dexamethasone  (DECADRON ) injection 10 mg  10 mg Intravenous Once Doy Taaffe J, MD       dextrose  5 % solution   Intravenous Once Courney Garrod J, MD       fluorouracil  (ADRUCIL ) 4,300 mg in sodium chloride  0.9 % 64 mL chemo infusion  2,400 mg/m2 (Treatment Plan Recorded) Intravenous 1 day or 1 dose Keiyon Plack J, MD       fluorouracil  (ADRUCIL ) chemo injection 700 mg  400 mg/m2 (Treatment Plan Recorded) Intravenous Once Foye Haggart J, MD       leucovorin  716 mg in dextrose  5 % 250 mL infusion  400 mg/m2 (Treatment Plan Recorded) Intravenous Once Roshunda Keir J, MD  oxaliplatin  (ELOXATIN ) 150 mg in dextrose  5 % 500 mL chemo infusion  85 mg/m2 (Treatment Plan Recorded) Intravenous Once Lyan Moyano J, MD       palonosetron  (ALOXI ) injection 0.25 mg  0.25 mg Intravenous Once Merlon Alcorta J, MD       sodium chloride  flush (NS) 0.9 % injection 10 mL  10 mL Intracatheter PRN Jacobo Evalene PARAS, MD   10 mL at 12/09/23 1322    OBJECTIVE: Vitals:   05/09/24 0842  BP: 135/75  Pulse: 64  Resp: 18  Temp: 97.7 F (36.5 C)  SpO2: 100%       Body mass index is 24.96 kg/m.    ECOG FS:1 - Symptomatic but completely ambulatory  General: Well-developed, well-nourished, no acute distress. Eyes: Pink conjunctiva, anicteric sclera. HEENT: Normocephalic, moist mucous membranes. Lungs: No audible wheezing or coughing. Heart: Regular rate and rhythm. Abdomen: Soft, nontender, no obvious distention. Musculoskeletal: No edema, cyanosis, or clubbing. Neuro: Alert, answering all questions appropriately. Cranial nerves grossly intact. Skin: No rashes or petechiae noted. Psych: Normal affect.  LAB  RESULTS:  Lab Results  Component Value Date   NA 136 05/09/2024   K 3.3 (L) 05/09/2024   CL 105 05/09/2024   CO2 23 05/09/2024   GLUCOSE 85 05/09/2024   BUN 8 05/09/2024   CREATININE 0.60 05/09/2024   CALCIUM  7.8 (L) 05/09/2024   PROT 6.8 05/09/2024   ALBUMIN  2.3 (L) 05/09/2024   AST 158 (H) 05/09/2024   ALT 23 05/09/2024   ALKPHOS 225 (H) 05/09/2024   BILITOT 1.6 (H) 05/09/2024   GFRNONAA >60 05/09/2024   GFRAA >60 06/09/2018    Lab Results  Component Value Date   WBC 3.1 (L) 05/09/2024   NEUTROABS 1.7 05/09/2024   HGB 9.1 (L) 05/09/2024   HCT 27.9 (L) 05/09/2024   MCV 85.1 05/09/2024   PLT 236 05/09/2024     STUDIES: No results found.    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  She did however see multiple subcentimeter lesions in the liver which are suspected metastatic, but unclear.  CEA had trended down from 1780-237, but now continues to trend up and her most recent result was 2204.  Patient has canceled her restaging CT scan twice to assess for any progression, but this has been rescheduled for 1 week from today.  Will proceed with cycle 15 of FOLFOX today.  Avastin  has been discontinued altogether given her history of bowel perforation and obstruction.  Return to clinic in 2 days for pump removal and then in 2 weeks for further evaluation and consideration of cycle 16.   Uterine masses: Patient reports she has a history of benign fibroids.  Appreciate gynecology input.  Monitor.   Anemia: Chronic and unchanged.  Patient's hemoglobin is 9.1. Hypokalemia: Improved.  Continue oral potassium supplementation. Hyperbilirubinemia: Bilirubin has trended up slightly to 1.6.  CT scan as above. Transaminitis: Patient's  AST has increased to 158.  Proceed with treatment as above.  CT scan as above. Insomnia: Significantly improved.  Patient does not complain of this today. Coping/adjustment: Patient was previously given a referral to [facility]. Hypertension: Well-controlled.  Continue monitoring and treatment per primary care.  Diarrhea: Improved with OTC Imodium.  Monitor.   Cramping:  Patient was given a prescription for Flexeril today.   Patient expressed understanding and was in agreement with this plan. She also understands that She can call clinic at any time with any questions, concerns, or complaints.    Cancer Staging  Primary adenocarcinoma of ascending colon [facility]) Staging form: Colon and Rectum, AJCC 8th Edition - Clinical stage from 08/17/2023: Stage IVB (cTX, cNX, pM1b) - Signed by Jacobo Evalene PARAS, MD on 08/17/2023 Stage prefix: Initial diagnosis   Evalene PARAS Jacobo, MD   05/09/2024 9:26 AM

## 2024-05-09 NOTE — Progress Notes (Signed)
 Having really bad muscle spasms that start in her back and go down her legs. This has been going on since last week and gets worse at night. It is affecting her walking. Does not have any muscle relaxer or anything for pain when it starts.

## 2024-05-10 ENCOUNTER — Encounter: Payer: Self-pay | Admitting: Oncology

## 2024-05-10 LAB — CEA: CEA: 3310 ng/mL — ABNORMAL HIGH (ref 0.0–4.7)

## 2024-05-11 ENCOUNTER — Inpatient Hospital Stay: Payer: Self-pay | Attending: Oncology

## 2024-05-11 ENCOUNTER — Other Ambulatory Visit: Payer: Self-pay | Admitting: *Deleted

## 2024-05-11 VITALS — BP 143/78 | HR 67

## 2024-05-11 DIAGNOSIS — C7801 Secondary malignant neoplasm of right lung: Secondary | ICD-10-CM | POA: Insufficient documentation

## 2024-05-11 DIAGNOSIS — D259 Leiomyoma of uterus, unspecified: Secondary | ICD-10-CM | POA: Diagnosis not present

## 2024-05-11 DIAGNOSIS — R252 Cramp and spasm: Secondary | ICD-10-CM | POA: Insufficient documentation

## 2024-05-11 DIAGNOSIS — Z5189 Encounter for other specified aftercare: Secondary | ICD-10-CM | POA: Insufficient documentation

## 2024-05-11 DIAGNOSIS — D649 Anemia, unspecified: Secondary | ICD-10-CM | POA: Insufficient documentation

## 2024-05-11 DIAGNOSIS — Z5111 Encounter for antineoplastic chemotherapy: Secondary | ICD-10-CM | POA: Diagnosis present

## 2024-05-11 DIAGNOSIS — E876 Hypokalemia: Secondary | ICD-10-CM | POA: Diagnosis not present

## 2024-05-11 DIAGNOSIS — R197 Diarrhea, unspecified: Secondary | ICD-10-CM | POA: Diagnosis not present

## 2024-05-11 DIAGNOSIS — G4700 Insomnia, unspecified: Secondary | ICD-10-CM | POA: Diagnosis not present

## 2024-05-11 DIAGNOSIS — C7802 Secondary malignant neoplasm of left lung: Secondary | ICD-10-CM | POA: Insufficient documentation

## 2024-05-11 DIAGNOSIS — R102 Pelvic and perineal pain: Secondary | ICD-10-CM | POA: Insufficient documentation

## 2024-05-11 DIAGNOSIS — M549 Dorsalgia, unspecified: Secondary | ICD-10-CM | POA: Diagnosis not present

## 2024-05-11 DIAGNOSIS — C787 Secondary malignant neoplasm of liver and intrahepatic bile duct: Secondary | ICD-10-CM | POA: Diagnosis not present

## 2024-05-11 DIAGNOSIS — Z87891 Personal history of nicotine dependence: Secondary | ICD-10-CM | POA: Diagnosis not present

## 2024-05-11 DIAGNOSIS — C182 Malignant neoplasm of ascending colon: Secondary | ICD-10-CM | POA: Diagnosis not present

## 2024-05-11 DIAGNOSIS — D709 Neutropenia, unspecified: Secondary | ICD-10-CM | POA: Diagnosis not present

## 2024-05-11 DIAGNOSIS — I10 Essential (primary) hypertension: Secondary | ICD-10-CM | POA: Diagnosis not present

## 2024-05-11 DIAGNOSIS — D696 Thrombocytopenia, unspecified: Secondary | ICD-10-CM | POA: Diagnosis not present

## 2024-05-11 DIAGNOSIS — R7401 Elevation of levels of liver transaminase levels: Secondary | ICD-10-CM | POA: Insufficient documentation

## 2024-05-11 DIAGNOSIS — N289 Disorder of kidney and ureter, unspecified: Secondary | ICD-10-CM | POA: Diagnosis not present

## 2024-05-11 MED ORDER — CYCLOBENZAPRINE HCL 10 MG PO TABS: 10.0000 mg | ORAL_TABLET | Freq: Three times a day (TID) | ORAL | 0 refills | Status: DC | PRN

## 2024-05-11 MED ORDER — SODIUM CHLORIDE 0.9% FLUSH
10.0000 mL | INTRAVENOUS | Status: DC | PRN
  Administered 2024-05-11: 10 mL
  Filled 2024-05-11: qty 10

## 2024-05-11 MED ORDER — HEPARIN SOD (PORK) LOCK FLUSH 100 UNIT/ML IV SOLN
500.0000 [IU] | Freq: Once | INTRAVENOUS | Status: AC | PRN
  Administered 2024-05-11: 500 [IU]
  Filled 2024-05-11: qty 5

## 2024-05-11 NOTE — Progress Notes (Signed)
 Patient's home infusion pump tubing appears to be cut. Pt was unsure of how it happened, when asked if she had used scissors for anything, she said yes to change her abdominal dressing which is also when she noticed the cut in the tubing. This happened after the completion of the Adrucil  infusion. Dr. Jacobo aware and pt stable at discharge.

## 2024-05-16 ENCOUNTER — Ambulatory Visit
Admission: RE | Admit: 2024-05-16 | Discharge: 2024-05-16 | Disposition: A | Discharge status: Home / Self Care | Payer: Self-pay | Source: Ambulatory Visit | Attending: Oncology | Admitting: Oncology

## 2024-05-16 DIAGNOSIS — C182 Malignant neoplasm of ascending colon: Secondary | ICD-10-CM | POA: Diagnosis present

## 2024-05-16 MED ORDER — IOHEXOL 300 MG/ML  SOLN
100.0000 mL | Freq: Once | INTRAMUSCULAR | Status: AC | PRN
  Administered 2024-05-16: 100 mL via INTRAVENOUS

## 2024-05-23 ENCOUNTER — Other Ambulatory Visit: Payer: Self-pay | Admitting: *Deleted

## 2024-05-23 ENCOUNTER — Encounter: Payer: Self-pay | Admitting: Oncology

## 2024-05-23 ENCOUNTER — Inpatient Hospital Stay

## 2024-05-23 ENCOUNTER — Inpatient Hospital Stay (HOSPITAL_BASED_OUTPATIENT_CLINIC_OR_DEPARTMENT_OTHER): Admitting: Oncology

## 2024-05-23 VITALS — BP 111/75 | HR 77 | Temp 98.6°F | Resp 19 | Wt 153.8 lb

## 2024-05-23 DIAGNOSIS — C182 Malignant neoplasm of ascending colon: Secondary | ICD-10-CM

## 2024-05-23 DIAGNOSIS — Z5111 Encounter for antineoplastic chemotherapy: Secondary | ICD-10-CM | POA: Diagnosis not present

## 2024-05-23 LAB — CBC WITH DIFFERENTIAL (CANCER CENTER ONLY)
Abs Immature Granulocytes: 0.01 K/uL (ref 0.00–0.07)
Basophils Absolute: 0 K/uL (ref 0.0–0.1)
Basophils Relative: 2 %
Eosinophils Absolute: 0 K/uL (ref 0.0–0.5)
Eosinophils Relative: 2 %
HCT: 28.1 % — ABNORMAL LOW (ref 36.0–46.0)
Hemoglobin: 9 g/dL — ABNORMAL LOW (ref 12.0–15.0)
Immature Granulocytes: 1 %
Lymphocytes Relative: 25 %
Lymphs Abs: 0.5 K/uL — ABNORMAL LOW (ref 0.7–4.0)
MCH: 27.4 pg (ref 26.0–34.0)
MCHC: 32 g/dL (ref 30.0–36.0)
MCV: 85.4 fL (ref 80.0–100.0)
Monocytes Absolute: 0.7 K/uL (ref 0.1–1.0)
Monocytes Relative: 35 %
Neutro Abs: 0.7 K/uL — ABNORMAL LOW (ref 1.7–7.7)
Neutrophils Relative %: 35 %
Platelet Count: 298 K/uL (ref 150–400)
RBC: 3.29 MIL/uL — ABNORMAL LOW (ref 3.87–5.11)
RDW: 21.2 % — ABNORMAL HIGH (ref 11.5–15.5)
WBC Count: 1.9 K/uL — ABNORMAL LOW (ref 4.0–10.5)
nRBC: 0 % (ref 0.0–0.2)

## 2024-05-23 LAB — CMP (CANCER CENTER ONLY)
ALT: 16 U/L (ref 0–44)
AST: 116 U/L — ABNORMAL HIGH (ref 15–41)
Albumin: 2.1 g/dL — ABNORMAL LOW (ref 3.5–5.0)
Alkaline Phosphatase: 243 U/L — ABNORMAL HIGH (ref 38–126)
Anion gap: 6 (ref 5–15)
BUN: 10 mg/dL (ref 6–20)
CO2: 22 mmol/L (ref 22–32)
Calcium: 7.7 mg/dL — ABNORMAL LOW (ref 8.9–10.3)
Chloride: 107 mmol/L (ref 98–111)
Creatinine: 0.74 mg/dL (ref 0.44–1.00)
GFR, Estimated: >60 mL/min (ref >60)
Glucose, Bld: 101 mg/dL — ABNORMAL HIGH (ref 70–99)
Potassium: 3.5 mmol/L (ref 3.5–5.1)
Sodium: 135 mmol/L (ref 135–145)
Total Bilirubin: 1.7 mg/dL — ABNORMAL HIGH (ref 0.0–1.2)
Total Protein: 6.7 g/dL (ref 6.5–8.1)

## 2024-05-23 MED ORDER — SODIUM CHLORIDE 0.9 % IV SOLN
400.0000 mg/m2 | Freq: Once | INTRAVENOUS | Status: AC
  Administered 2024-05-23: 724 mg via INTRAVENOUS
  Filled 2024-05-23: qty 18.7

## 2024-05-23 MED ORDER — SODIUM CHLORIDE 0.9 % IV SOLN
180.0000 mg/m2 | Freq: Once | INTRAVENOUS | Status: AC
  Administered 2024-05-23: 320 mg via INTRAVENOUS
  Filled 2024-05-23: qty 15

## 2024-05-23 MED ORDER — SODIUM CHLORIDE 0.9 % IV SOLN
INTRAVENOUS | Status: DC
  Filled 2024-05-23: qty 250

## 2024-05-23 MED ORDER — ATROPINE SULFATE 1 MG/ML IV SOLN
0.5000 mg | Freq: Once | INTRAVENOUS | Status: AC | PRN
  Administered 2024-05-23: 0.5 mg via INTRAVENOUS
  Filled 2024-05-23: qty 1

## 2024-05-23 MED ORDER — SODIUM CHLORIDE 0.9 % IV SOLN
2400.0000 mg/m2 | INTRAVENOUS | Status: DC
  Administered 2024-05-23: 4350 mg via INTRAVENOUS
  Filled 2024-05-23: qty 87

## 2024-05-23 MED ORDER — DEXAMETHASONE SODIUM PHOSPHATE 10 MG/ML IJ SOLN
10.0000 mg | Freq: Once | INTRAMUSCULAR | Status: AC
  Administered 2024-05-23: 10 mg via INTRAVENOUS
  Filled 2024-05-23: qty 1

## 2024-05-23 MED ORDER — PALONOSETRON HCL INJECTION 0.25 MG/5ML
0.2500 mg | Freq: Once | INTRAVENOUS | Status: AC
  Administered 2024-05-23: 0.25 mg via INTRAVENOUS
  Filled 2024-05-23: qty 5

## 2024-05-23 MED ORDER — FLUOROURACIL CHEMO INJECTION 2.5 GM/50ML
400.0000 mg/m2 | Freq: Once | INTRAVENOUS | Status: AC
  Administered 2024-05-23: 700 mg via INTRAVENOUS
  Filled 2024-05-23: qty 14

## 2024-05-23 MED ORDER — DIPHENOXYLATE-ATROPINE 2.5-0.025 MG PO TABS: 1.0000 | ORAL_TABLET | Freq: Four times a day (QID) | ORAL | 0 refills | Status: DC | PRN

## 2024-05-23 NOTE — Progress Notes (Signed)
 Patient has no concerns

## 2024-05-23 NOTE — Progress Notes (Signed)
 DISCONTINUE ON PATHWAY REGIMEN - Colorectal     A cycle is every 14 days:     Bevacizumab -xxxx      Oxaliplatin       Leucovorin       Fluorouracil       Fluorouracil    **Always confirm dose/schedule in your pharmacy ordering system**  PRIOR TREATMENT: FRMND61: [id number] + Bevacizumab  q14 Days  START ON PATHWAY REGIMEN - Colorectal     A cycle is every 14 days:     Irinotecan       Leucovorin       Fluorouracil       Fluorouracil    **Always confirm dose/schedule in your pharmacy ordering system**  Patient Characteristics: Distant Metastases, Nonsurgical Candidate, Non-KRAS G12C, RAS Mutation Positive/Unknown (BRAF V600 Wild-Type/Unknown), Standard Cytotoxic Therapy, Second Line Standard Cytotoxic Therapy, Bevacizumab  Ineligible Tumor Location: Colon Therapeutic Status: Distant Metastases Microsatellite/Mismatch Repair Status: Unknown BRAF Mutation Status: Did Not Order Test KRAS/NRAS Mutation Status: Did Not Order Test Preferred Therapy Approach: Standard Cytotoxic Therapy Standard Cytotoxic Line of Therapy: Second Line Standard Cytotoxic Therapy Bevacizumab  Eligibility: Ineligible Intent of Therapy: Non-Curative / Palliative Intent, Discussed with Patient

## 2024-05-23 NOTE — Progress Notes (Signed)
 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  INTERVAL HISTORY: Patient returns to clinic today for further evaluation and discussion of her CT scan results.  She has increased weakness and fatigue.  She does not complain of any pain.  She continues to have diarrhea, but states it is well-controlled with Imodium.  She has no neurologic complaints.  She denies any recent fevers or illnesses. She has no chest pain, shortness of breath, cough, or hemoptysis.  She denies any nausea, vomiting, or constipation.  She has no melena or hematochezia.  She has no urinary complaints.  Patient offers no further specific complaints today.  REVIEW OF SYSTEMS:   Review of Systems  Constitutional:  Positive for malaise/fatigue. Negative for fever and weight loss.  Respiratory: Negative.  Negative for cough, hemoptysis and shortness of breath.   Cardiovascular: Negative.  Negative for chest pain and leg swelling.  Gastrointestinal: Negative.  Negative for abdominal pain, blood in stool, constipation, diarrhea, melena, nausea and vomiting.  Genitourinary: Negative.  Negative for dysuria.  Musculoskeletal: Negative.  Negative for back pain.  Neurological:  Positive for weakness. Negative for dizziness, focal weakness and headaches.  Psychiatric/Behavioral: Negative.  The patient is not nervous/anxious.     As per HPI. Otherwise, a complete review of systems is negative.  PAST MEDICAL HISTORY: Past Medical History:  Diagnosis Date   Abnormal uterine bleeding    Anemia    Anxiety    Asthma    Cardiomegaly    Cardiovascular  disease    COPD (chronic obstructive pulmonary disease) (HCC)    Depression    Diabetes mellitus without complication (HCC)    Dyspnea    Goiter, nontoxic, multinodular    Heart murmur    Hypertension    Hypothyroidism    Lung cancer, primary, with metastasis from lung to other site [facility])    Malignant neoplasm of ascending colon (HCC)    Primary adenocarcinoma of ascending colon (HCC)    Syncopal episodes    Thyrotoxicosis    Uterine leiomyoma     PAST SURGICAL HISTORY: Past Surgical History:  Procedure Laterality Date   BIOPSY  08/12/2023   Procedure: BIOPSY;  Surgeon: Aundria, Ladell POUR, MD;  Location: [facility] ENDOSCOPY;  Service: Gastroenterology;;   CESAREAN SECTION     COLONOSCOPY N/A 08/12/2023   Procedure: COLONOSCOPY;  Surgeon: Toledo, Ladell POUR, MD;  Location: [facility] ENDOSCOPY;  Service: Gastroenterology;  Laterality: N/A;  3rd patient   COLONOSCOPY WITH PROPOFOL  N/A 10/29/2015   Procedure: COLONOSCOPY WITH PROPOFOL ;  Surgeon: Donnice Vaughn Manes, MD;  Location: [facility] ENDOSCOPY;  Service: Endoscopy;  Laterality: N/A;   DILITATION & CURRETTAGE/HYSTROSCOPY WITH NOVASURE ABLATION N/A 06/25/2018   Procedure: DILATATION & CURETTAGE/HYSTEROSCOPY WITH MINERVA ABLATION;  Surgeon: Connell Davies, MD;  Location: [facility] ORS;  Service: Gynecology;  Laterality: N/A;   LAPAROTOMY N/A 02/07/2024   Procedure: LAPAROTOMY, EXPLORATORY SMALL BOWEL RESECTION HERNIA REPAIR;  Surgeon: Jordis Laneta JULIANNA, MD;  Location: [facility] ORS;  Service: General;  Laterality: N/A;   PORTACATH PLACEMENT N/A 08/26/2023   Procedure: INSERTION PORT-A-CATH;  Surgeon: Rodolph Romano, MD;  Location: [facility] ORS;  Service: General;  Laterality: N/A;   SUBMUCOSAL  TATTOO INJECTION  08/12/2023   Procedure: SUBMUCOSAL TATTOO INJECTION;  Surgeon: Toledo, Ladell POUR, MD;  Location: [facility] ENDOSCOPY;  Service: Gastroenterology;;   TUBAL LIGATION      FAMILY HISTORY: Family History  Adopted: Yes  Problem Relation Age of Onset    Breast cancer Neg Hx     ADVANCED DIRECTIVES (Y/N):  N  HEALTH MAINTENANCE: Social History   Tobacco Use   Smoking status: Former    Current packs/day: 0.00    Types: Cigarettes    Quit date: 2024    Years since quitting: 1.5   Smokeless tobacco: Never  Vaping Use   Vaping status: Former  Substance Use Topics   Alcohol use: Never   Drug use: Never     Colonoscopy:  PAP:  Bone density:  Lipid panel:  No Known Allergies  Current Outpatient Medications  Medication Sig Dispense Refill   albuterol  (PROVENTIL  HFA;VENTOLIN  HFA) 108 (90 Base) MCG/ACT inhaler Inhale 2 puffs into the lungs every 6 (six) hours as needed (for exercise induced asthma).     atorvastatin  (LIPITOR) 10 MG tablet SMARTSIG:1 Tablet(s) By Mouth Every Evening     cyclobenzaprine  (FLEXERIL ) 10 MG tablet Take 1 tablet (10 mg total) by mouth 3 (three) times daily as needed for muscle spasms. 30 tablet 0   ezetimibe (ZETIA) 10 MG tablet Take 10 mg by mouth daily.     hydrALAZINE  (APRESOLINE ) 10 MG tablet Take 10 mg by mouth 3 (three) times daily as needed.     levothyroxine  (SYNTHROID ) 100 MCG tablet Take 100 mcg by mouth daily.     metFORMIN (GLUCOPHAGE) 500 MG tablet Take 500 mg by mouth 2 (two) times daily.      metoprolol tartrate (LOPRESSOR) 25 MG tablet Take 25 mg by mouth 2 (two) times daily.     Multiple Vitamins-Minerals (ALIVE ONCE DAILY WOMENS PO) Take 2 tablets by mouth daily. Gummies     potassium chloride  SA (KLOR-CON  M) 20 MEQ tablet Take 1 tablet (20 mEq total) by mouth daily. 30 tablet 1   diphenoxylate -atropine  (LOMOTIL ) 2.5-0.025 MG tablet Take 1 tablet by mouth 4 (four) times daily as needed for diarrhea or loose stools. 30 tablet 0   pantoprazole  (PROTONIX ) 40 MG tablet Take 1 tablet (40 mg total) by mouth daily. (Patient not taking: Reported on 05/23/2024) 30 tablet 0   No current facility-administered medications for this visit.    OBJECTIVE: Vitals:   05/23/24 0852  BP: 111/75  Pulse:  77  Resp: 19  Temp: 98.6 F (37 C)  SpO2: 98%       Body mass index is 24.45 kg/m.    ECOG FS:1 - Symptomatic but completely ambulatory  General: Well-developed, well-nourished, no acute distress.  Sitting in a wheelchair. Eyes: Pink conjunctiva, anicteric sclera. HEENT: Normocephalic, moist mucous membranes. Lungs: No audible wheezing or coughing. Heart: Regular rate and rhythm. Abdomen: Soft, nontender, no obvious distention. Musculoskeletal: No edema, cyanosis, or clubbing. Neuro: Alert, answering all questions appropriately. Cranial nerves grossly intact. Skin: No rashes or petechiae noted. Psych: Normal affect.   LAB RESULTS:  Lab Results  Component Value Date   NA 135 05/23/2024   K 3.5 05/23/2024   CL 107 05/23/2024   CO2 22 05/23/2024   GLUCOSE 101 (H) 05/23/2024   BUN 10 05/23/2024   CREATININE 0.74 05/23/2024   CALCIUM  7.7 (L) 05/23/2024   PROT 6.7 05/23/2024   ALBUMIN  2.1 (L) 05/23/2024   AST 116 (H) 05/23/2024   ALT 16 05/23/2024  ALKPHOS 243 (H) 05/23/2024   BILITOT 1.7 (H) 05/23/2024   GFRNONAA >60 05/23/2024   GFRAA >60 06/09/2018    Lab Results  Component Value Date   WBC 1.9 (L) 05/23/2024   NEUTROABS 0.7 (L) 05/23/2024   HGB 9.0 (L) 05/23/2024   HCT 28.1 (L) 05/23/2024   MCV 85.4 05/23/2024   PLT 298 05/23/2024     STUDIES: CT CHEST ABDOMEN PELVIS W CONTRAST Result Date: 05/17/2024 CLINICAL DATA:  Metastatic colon cancer restaging * Tracking Code: BO * EXAM: CT CHEST, ABDOMEN, AND PELVIS WITH CONTRAST TECHNIQUE: Multidetector CT imaging of the chest, abdomen and pelvis was performed following the standard protocol during bolus administration of intravenous contrast. RADIATION DOSE REDUCTION: This exam was performed according to the departmental dose-optimization program which includes automated exposure control, adjustment of the mA and/or kV according to patient size and/or use of iterative reconstruction technique. CONTRAST:   OMNIPAQUE  IOHEXOL  300 MG/ML SOLN additional oral enteric contrast COMPARISON:  CT abdomen pelvis, 02/07/2024, CT chest abdomen pelvis, 11/30/2023 FINDINGS: CT CHEST FINDINGS Cardiovascular: Right chest port catheter. Is a industrial delayed electrical normal heart size. No pericardial effusion. Mediastinum/Nodes: No enlarged mediastinal, hilar, or axillary lymph nodes. Thyroid  gland, trachea, and esophagus demonstrate no significant findings. Lungs/Pleura: When compared to most recent imaging of the chest dated 11/30/2023, numerous new and enlarged pulmonary nodules throughout the lungs, index nodule in the peripheral left apex measuring 1.8 x 1.2 cm, previously 0.9 x 0.7 cm (series 9, image 40). Numerous example new nodules in the medial right lung base measuring up to 0.7 cm (series 9, image 110). No pleural effusion or pneumothorax. Musculoskeletal: No chest wall abnormality. No acute osseous findings. CT ABDOMEN PELVIS FINDINGS Hepatobiliary: Compared to most recent imaging of the abdomen pelvis dated 02/07/2024, numerous treated, calcified and hypodense liver metastases are not significantly changed, however there are innumerable new rim enhancing metastases of varying sizes throughout the liver, for example a rim enhancing lesion in hepatic segment VIII measuring 1.7 x 1.7 cm (series 3, image 12) and a rim enhancing lesion in hepatic segment IV measuring 2.5 x 2.1 cm (series 3, image 21). Pseudocirrhotic capsular retraction. Contracted gallbladder. No gallstones, gallbladder wall thickening, or biliary dilatation. Pancreas: Unremarkable. No pancreatic ductal dilatation or surrounding inflammatory changes. Spleen: Normal in size without significant abnormality. Adrenals/Urinary Tract: Adrenal glands are unremarkable. Kidneys are normal, without renal calculi, solid lesion, or hydronephrosis. Bladder is unremarkable. Stomach/Bowel: Stomach is within normal limits. Interval enlargement of cecal mass measuring 6.7  x 5.6 cm, previously 5.0 x 4.2 cm (series 3, image 55). Vascular/Lymphatic: Aortic atherosclerosis. Newly enlarged, necrotic appearing porta hepatis node measuring 2.6 x 2.2 cm (series 3, image 33). New necrotic appearing lymph node or soft tissue implant in the splenic hilum adjacent to the tip of the pancreatic tail measuring 2.2 x 1.7 cm (series 3, image 23). Newly enlarged retroperitoneal lymph nodes measuring up to 2.0 x 1.6 cm (series 3, image 40). 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 Other: Anasarca. New, large volume ascites throughout the abdomen and pelvis. Increased peritoneal caking and nodularity, particularly throughout the left upper quadrant (series 3, image 40). Musculoskeletal: No acute osseous findings. IMPRESSION: 1. Compared to most recent imaging of the chest dated 11/30/2023, numerous new and enlarged pulmonary nodules throughout the lungs, consistent with worsened pulmonary metastatic disease. 2. Compared to most recent imaging of  the abdomen and pelvis dated 02/07/2024, innumerable new rim enhancing metastases of varying sizes throughout the liver with additional background of unchanged, treated calcified and hypodense liver metastases. 3. New, large volume ascites throughout the abdomen and pelvis. Increased peritoneal caking and nodularity, particularly throughout the left upper quadrant, consistent with worsened peritoneal carcinomatosis. 4. Newly enlarged, necrotic appearing porta hepatis and retroperitoneal lymph nodes. New necrotic appearing lymph node or soft tissue implant in the splenic hilum adjacent to the tip of the pancreatic tail. 5. 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 6. Interval enlargement of cecal mass. Aortic Atherosclerosis (ICD10-I70.0). Electronically Signed   By: Marolyn JONETTA Jaksch M.D.   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  Patient's CEA continues to trend up and is now 3110.  FOLFOX has been discontinued.  Despite mild neutropenia, proceed with cycle 1 of FOLFIRI.  Avastin  has been discontinued altogether given her history of bowel perforation and obstruction.  Return to clinic in 2 days for pump removal and Udenyca .  Patient with then return to clinic in 2 weeks for further evaluation and consideration of cycle 2.   Uterine masses: Patient reports she has a history of benign fibroids.  Appreciate gynecology input.  Monitor.   Neutropenia: ANC is 0.7.  Proceed cautiously with treatment as above.  Will add Udenyca  with each cycle. Anemia: Chronic and unchanged.  Patient's hemoglobin is 9.0 today. Hypokalemia: Resolved.  Continue oral potassium supplementation. Hyperbilirubinemia: Bilirubin trended up to 1.7.  Likely secondary to progressive disease.  Proceed with treatment as above.  Transaminitis: Patient has a chronically elevated AST.  Monitor.   Insomnia: Significantly improved.  Patient does not complain of this today. Coping/adjustment: Patient was previously given a referral to [facility]. Hypertension: Blood pressure was within normal limits today.   Diarrhea: Improved with OTC Imodium.  Patient was also given a prescription for Lomotil  today. Cramping: Continue Flexeril  as needed.    Patient expressed understanding and was in agreement with this plan. She also understands that She can call clinic at any time with any questions, concerns, or complaints.    Cancer Staging  Primary adenocarcinoma of ascending colon [facility]) Staging form: Colon and Rectum, AJCC 8th Edition - Clinical stage from  08/17/2023: Stage IVB (cTX, cNX, pM1b) - Signed by Jacobo Evalene PARAS, MD on 08/17/2023 Stage prefix: Initial diagnosis   Evalene PARAS Jacobo, MD   05/23/2024 11:34 PM

## 2024-05-23 NOTE — Patient Instructions (Signed)
 [facility] - A DEPT OF [facility]  Discharge Instructions: Thank you for choosing [facility] to provide your oncology and hematology care.  If you have a lab appointment with the Cancer Center, please go directly to the Cancer Center and check in at the registration area.  Wear comfortable clothing and clothing appropriate for easy access to any Portacath or PICC line.   We strive to give you quality time with your provider. You may need to reschedule your appointment if you arrive late (15 or more minutes).  Arriving late affects you and other patients whose appointments are after yours.  Also, if you miss three or more appointments without notifying the office, you may be dismissed from the clinic at the provider's discretion.      For prescription refill requests, have your pharmacy contact our office and allow 72 hours for refills to be completed.    Today you received the following chemotherapy and/or immunotherapy agents- irinotecan, leucovorin, 5FU      To help prevent nausea and vomiting after your treatment, we encourage you to take your nausea medication as directed.  BELOW ARE SYMPTOMS THAT SHOULD BE REPORTED IMMEDIATELY: *FEVER GREATER THAN 100.4 F (38 C) OR HIGHER *CHILLS OR SWEATING *NAUSEA AND VOMITING THAT IS NOT CONTROLLED WITH YOUR NAUSEA MEDICATION *UNUSUAL SHORTNESS OF BREATH *UNUSUAL BRUISING OR BLEEDING *URINARY PROBLEMS (pain or burning when urinating, or frequent urination) *BOWEL PROBLEMS (unusual diarrhea, constipation, pain near the anus) TENDERNESS IN MOUTH AND THROAT WITH OR WITHOUT PRESENCE OF ULCERS (sore throat, sores in mouth, or a toothache) UNUSUAL RASH, SWELLING OR PAIN  UNUSUAL VAGINAL DISCHARGE OR ITCHING   Items with * indicate a potential emergency and should be followed up as soon as possible or go to the Emergency Department if any problems should occur.  Please show the CHEMOTHERAPY ALERT CARD  or IMMUNOTHERAPY ALERT CARD at check-in to the Emergency Department and triage nurse.  Should you have questions after your visit or need to cancel or reschedule your appointment, please contact [facility] - A DEPT OF [facility]  [phone] and follow the prompts.  Office hours are 8:00 a.m. to 4:30 p.m. Monday - Friday. Please note that voicemails left after 4:00 p.m. may not be returned until the following business day.  We are closed weekends and major holidays. You have access to a nurse at all times for urgent questions. Please call the main number to the clinic [phone] and follow the prompts.  For any non-urgent questions, you may also contact your provider using MyChart. We now offer e-Visits for anyone 64 and older to request care online for non-urgent symptoms. For details visit [email or web address].   Also download the MyChart app! Go to the app store, search "MyChart", open the app, select [facility], and log in with your MyChart username and password.

## 2024-05-23 NOTE — Progress Notes (Signed)
 Per Dr. Jacobo  OK to treat with T. Bili 1.7 and AST 116, ANC 0.7    Maudie FORBES Andreas, PharmD, BCPS Clinical Pharmacist

## 2024-05-24 LAB — CEA: CEA: 2377 ng/mL — ABNORMAL HIGH (ref 0.0–4.7)

## 2024-05-25 ENCOUNTER — Inpatient Hospital Stay

## 2024-05-25 ENCOUNTER — Inpatient Hospital Stay: Payer: Self-pay

## 2024-05-25 VITALS — BP 103/70 | HR 63 | Temp 95.0°F | Resp 18

## 2024-05-25 DIAGNOSIS — C182 Malignant neoplasm of ascending colon: Secondary | ICD-10-CM

## 2024-05-25 DIAGNOSIS — Z5111 Encounter for antineoplastic chemotherapy: Secondary | ICD-10-CM | POA: Diagnosis not present

## 2024-05-25 MED ORDER — PEGFILGRASTIM-CBQV 6 MG/0.6ML ~~LOC~~ SOSY
6.0000 mg | PREFILLED_SYRINGE | Freq: Once | SUBCUTANEOUS | Status: AC
  Administered 2024-05-25: 6 mg via SUBCUTANEOUS
  Filled 2024-05-25: qty 0.6

## 2024-05-25 MED ORDER — HEPARIN SOD (PORK) LOCK FLUSH 100 UNIT/ML IV SOLN
500.0000 [IU] | Freq: Once | INTRAVENOUS | Status: AC | PRN
Start: 2024-05-25 — End: 2024-05-25
  Administered 2024-05-25: 500 [IU]
  Filled 2024-05-25: qty 5

## 2024-06-01 ENCOUNTER — Inpatient Hospital Stay (HOSPITAL_BASED_OUTPATIENT_CLINIC_OR_DEPARTMENT_OTHER): Payer: Self-pay | Admitting: Hospice and Palliative Medicine

## 2024-06-01 DIAGNOSIS — C182 Malignant neoplasm of ascending colon: Secondary | ICD-10-CM

## 2024-06-01 NOTE — Progress Notes (Signed)
VM left. Will reschedule. 

## 2024-06-06 ENCOUNTER — Other Ambulatory Visit: Payer: Self-pay

## 2024-06-06 ENCOUNTER — Ambulatory Visit: Payer: Self-pay | Admitting: Nurse Practitioner

## 2024-06-06 ENCOUNTER — Ambulatory Visit: Payer: Self-pay

## 2024-06-07 ENCOUNTER — Ambulatory Visit
Admission: RE | Admit: 2024-06-07 | Discharge: 2024-06-07 | Disposition: A | Discharge status: Home / Self Care | Source: Ambulatory Visit | Attending: Oncology | Admitting: Oncology

## 2024-06-07 ENCOUNTER — Encounter: Payer: Self-pay | Admitting: Oncology

## 2024-06-07 ENCOUNTER — Other Ambulatory Visit: Payer: Self-pay | Admitting: *Deleted

## 2024-06-07 ENCOUNTER — Inpatient Hospital Stay

## 2024-06-07 ENCOUNTER — Inpatient Hospital Stay (HOSPITAL_BASED_OUTPATIENT_CLINIC_OR_DEPARTMENT_OTHER): Admitting: Oncology

## 2024-06-07 VITALS — BP 106/77 | HR 88 | Temp 96.6°F | Resp 16 | Ht 66.0 in

## 2024-06-07 DIAGNOSIS — D649 Anemia, unspecified: Secondary | ICD-10-CM

## 2024-06-07 DIAGNOSIS — C182 Malignant neoplasm of ascending colon: Secondary | ICD-10-CM

## 2024-06-07 DIAGNOSIS — Z5111 Encounter for antineoplastic chemotherapy: Secondary | ICD-10-CM | POA: Diagnosis not present

## 2024-06-07 LAB — SAMPLE TO BLOOD BANK

## 2024-06-07 LAB — CMP (CANCER CENTER ONLY)
ALT: 15 U/L (ref 0–44)
AST: 75 U/L — ABNORMAL HIGH (ref 15–41)
Albumin: 1.9 g/dL — ABNORMAL LOW (ref 3.5–5.0)
Alkaline Phosphatase: 189 U/L — ABNORMAL HIGH (ref 38–126)
Anion gap: 11 (ref 5–15)
BUN: 37 mg/dL — ABNORMAL HIGH (ref 6–20)
CO2: 18 mmol/L — ABNORMAL LOW (ref 22–32)
Calcium: 7.9 mg/dL — ABNORMAL LOW (ref 8.9–10.3)
Chloride: 103 mmol/L (ref 98–111)
Creatinine: 1.25 mg/dL — ABNORMAL HIGH (ref 0.44–1.00)
GFR, Estimated: 49 mL/min — ABNORMAL LOW (ref >60)
Glucose, Bld: 105 mg/dL — ABNORMAL HIGH (ref 70–99)
Potassium: 3.3 mmol/L — ABNORMAL LOW (ref 3.5–5.1)
Sodium: 132 mmol/L — ABNORMAL LOW (ref 135–145)
Total Bilirubin: 2.8 mg/dL — ABNORMAL HIGH (ref 0.0–1.2)
Total Protein: 6.2 g/dL — ABNORMAL LOW (ref 6.5–8.1)

## 2024-06-07 LAB — CBC WITH DIFFERENTIAL (CANCER CENTER ONLY)
Abs Immature Granulocytes: 0.8 K/uL — ABNORMAL HIGH (ref 0.00–0.07)
Basophils Absolute: 0 K/uL (ref 0.0–0.1)
Basophils Relative: 0 %
Eosinophils Absolute: 0 K/uL (ref 0.0–0.5)
Eosinophils Relative: 0 %
HCT: 20.5 % — ABNORMAL LOW (ref 36.0–46.0)
Hemoglobin: 7.1 g/dL — ABNORMAL LOW (ref 12.0–15.0)
Lymphocytes Relative: 6 %
Lymphs Abs: 0.5 K/uL — ABNORMAL LOW (ref 0.7–4.0)
MCH: 27.5 pg (ref 26.0–34.0)
MCHC: 34.6 g/dL (ref 30.0–36.0)
MCV: 79.5 fL — ABNORMAL LOW (ref 80.0–100.0)
Metamyelocytes Relative: 4 %
Monocytes Absolute: 0.5 K/uL (ref 0.1–1.0)
Monocytes Relative: 7 %
Myelocytes: 5 %
Neutro Abs: 5.9 K/uL (ref 1.7–7.7)
Neutrophils Relative %: 77 %
Platelet Count: 90 K/uL — ABNORMAL LOW (ref 150–400)
Promyelocytes Relative: 1 %
RBC: 2.58 MIL/uL — ABNORMAL LOW (ref 3.87–5.11)
RDW: 23.2 % — ABNORMAL HIGH (ref 11.5–15.5)
Smear Review: DECREASED
WBC Count: 7.7 K/uL (ref 4.0–10.5)
nRBC: 2.3 % — ABNORMAL HIGH (ref 0.0–0.2)

## 2024-06-07 LAB — PREPARE RBC (CROSSMATCH)

## 2024-06-07 MED ORDER — HEPARIN SOD (PORK) LOCK FLUSH 100 UNIT/ML IV SOLN
500.0000 [IU] | Freq: Once | INTRAVENOUS | Status: AC
  Administered 2024-06-07: 500 [IU] via INTRAVENOUS
  Filled 2024-06-07: qty 5

## 2024-06-07 MED ORDER — HYDROCODONE-ACETAMINOPHEN 5-325 MG PO TABS: 1.0000 | ORAL_TABLET | ORAL | 0 refills | Status: DC | PRN

## 2024-06-07 MED ORDER — DEXAMETHASONE 4 MG PO TABS: 4.0000 mg | ORAL_TABLET | Freq: Every day | ORAL | 2 refills | Status: DC

## 2024-06-07 MED ORDER — SODIUM CHLORIDE 0.9 % IV SOLN
INTRAVENOUS | Status: AC
  Filled 2024-06-07 (×2): qty 250

## 2024-06-07 MED ORDER — SODIUM CHLORIDE 0.9% FLUSH
10.0000 mL | Freq: Once | INTRAVENOUS | Status: AC
  Administered 2024-06-07: 10 mL via INTRAVENOUS
  Filled 2024-06-07: qty 10

## 2024-06-07 NOTE — Progress Notes (Unsigned)
 Patient has been feeling worse and worse since she fell last week at her doctors office. Her appetite is gone. She is severe pain. Her synthroid  medication isn't quit strong enough and her provider retired on her.

## 2024-06-07 NOTE — Patient Instructions (Signed)

## 2024-06-07 NOTE — Progress Notes (Unsigned)
 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  INTERVAL HISTORY: Patient returns to clinic today for further evaluation and discussion of her CT scan results.  She has increased weakness and fatigue.  She does not complain of any pain.  She continues to have diarrhea, but states it is well-controlled with Imodium.  She has no neurologic complaints.  She denies any recent fevers or illnesses. She has no chest pain, shortness of breath, cough, or hemoptysis.  She denies any nausea, vomiting, or constipation.  She has no melena or hematochezia.  She has no urinary complaints.  Patient offers no further specific complaints today.  REVIEW OF SYSTEMS:   Review of Systems  Constitutional:  Positive for malaise/fatigue. Negative for fever and weight loss.  Respiratory: Negative.  Negative for cough, hemoptysis and shortness of breath.   Cardiovascular: Negative.  Negative for chest pain and leg swelling.  Gastrointestinal: Negative.  Negative for abdominal pain, blood in stool, constipation, diarrhea, melena, nausea and vomiting.  Genitourinary: Negative.  Negative for dysuria.  Musculoskeletal: Negative.  Negative for back pain.  Neurological:  Positive for weakness. Negative for dizziness, focal weakness and headaches.  Psychiatric/Behavioral: Negative.  The patient is not nervous/anxious.     As per HPI. Otherwise, a complete review of systems is negative.  PAST MEDICAL HISTORY: Past Medical History:  Diagnosis Date  . Abnormal uterine bleeding   . Anemia   . Anxiety   . Asthma   . Cardiomegaly   .  Cardiovascular disease   . COPD (chronic obstructive pulmonary disease) (HCC)   . Depression   . Diabetes mellitus without complication (HCC)   . Dyspnea   . Goiter, nontoxic, multinodular   . Heart murmur   . Hypertension   . Hypothyroidism   . Lung cancer, primary, with metastasis from lung to other site [facility])   . Malignant neoplasm of ascending colon (HCC)   . Primary adenocarcinoma of ascending colon (HCC)   . Syncopal episodes   . Thyrotoxicosis   . Uterine leiomyoma     PAST SURGICAL HISTORY: Past Surgical History:  Procedure Laterality Date  . BIOPSY  08/12/2023   Procedure: BIOPSY;  Surgeon: Toledo, Teodoro K, MD;  Location: [facility] ENDOSCOPY;  Service: Gastroenterology;;  . CESAREAN SECTION    . COLONOSCOPY N/A 08/12/2023   Procedure: COLONOSCOPY;  Surgeon: Toledo, Ladell POUR, MD;  Location: [facility] ENDOSCOPY;  Service: Gastroenterology;  Laterality: N/A;  3rd patient  . COLONOSCOPY WITH PROPOFOL  N/A 10/29/2015   Procedure: COLONOSCOPY WITH PROPOFOL ;  Surgeon: Donnice Vaughn Manes, MD;  Location: [facility] ENDOSCOPY;  Service: Endoscopy;  Laterality: N/A;  . DILITATION & CURRETTAGE/HYSTROSCOPY WITH NOVASURE ABLATION N/A 06/25/2018   Procedure: DILATATION & CURETTAGE/HYSTEROSCOPY WITH MINERVA ABLATION;  Surgeon: Connell Davies, MD;  Location: [facility] ORS;  Service: Gynecology;  Laterality: N/A;  . LAPAROTOMY N/A 02/07/2024   Procedure: LAPAROTOMY, EXPLORATORY SMALL BOWEL RESECTION HERNIA REPAIR;  Surgeon: Jordis Laneta JULIANNA, MD;  Location: [facility] ORS;  Service: General;  Laterality: N/A;  . PORTACATH PLACEMENT N/A 08/26/2023   Procedure: INSERTION PORT-A-CATH;  Surgeon: Rodolph Romano, MD;  Location: [facility] ORS;  Service: General;  Laterality: N/A;  . SUBMUCOSAL  TATTOO INJECTION  08/12/2023   Procedure: SUBMUCOSAL TATTOO INJECTION;  Surgeon: Toledo, Ladell POUR, MD;  Location: [facility] ENDOSCOPY;  Service: Gastroenterology;;  . TUBAL LIGATION      FAMILY HISTORY: Family History  Adopted: Yes   Problem Relation Age of Onset  . Breast cancer Neg Hx     ADVANCED DIRECTIVES (Y/N):  N  HEALTH MAINTENANCE: Social History   Tobacco Use  . Smoking status: Former    Current packs/day: 0.00    Types: Cigarettes    Quit date: 2024    Years since quitting: 1.5  . Smokeless tobacco: Never  Vaping Use  . Vaping status: Former  Substance Use Topics  . Alcohol use: Never  . Drug use: Never     Colonoscopy:  PAP:  Bone density:  Lipid panel:  No Known Allergies  Current Outpatient Medications  Medication Sig Dispense Refill  . albuterol  (PROVENTIL  HFA;VENTOLIN  HFA) 108 (90 Base) MCG/ACT inhaler Inhale 2 puffs into the lungs every 6 (six) hours as needed (for exercise induced asthma).    . atorvastatin  (LIPITOR) 10 MG tablet SMARTSIG:1 Tablet(s) By Mouth Every Evening    . cyclobenzaprine  (FLEXERIL ) 10 MG tablet Take 1 tablet (10 mg total) by mouth 3 (three) times daily as needed for muscle spasms. 30 tablet 0  . diphenoxylate -atropine  (LOMOTIL ) 2.5-0.025 MG tablet Take 1 tablet by mouth 4 (four) times daily as needed for diarrhea or loose stools. 30 tablet 0  . ezetimibe (ZETIA) 10 MG tablet Take 10 mg by mouth daily.    . hydrALAZINE  (APRESOLINE ) 10 MG tablet Take 10 mg by mouth 3 (three) times daily as needed.    . levothyroxine  (SYNTHROID ) 125 MCG tablet Take 125 mcg by mouth daily before breakfast.    . metFORMIN (GLUCOPHAGE) 500 MG tablet Take 500 mg by mouth 2 (two) times daily.     . metoprolol tartrate (LOPRESSOR) 25 MG tablet Take 25 mg by mouth 2 (two) times daily.    . Multiple Vitamins-Minerals (ALIVE ONCE DAILY WOMENS PO) Take 2 tablets by mouth daily. Gummies    . potassium chloride  SA (KLOR-CON  M) 20 MEQ tablet Take 1 tablet (20 mEq total) by mouth daily. 30 tablet 1  . pantoprazole  (PROTONIX ) 40 MG tablet Take 1 tablet (40 mg total) by mouth daily. (Patient not taking: Reported on 06/07/2024) 30 tablet 0   No current facility-administered medications for this  visit.    OBJECTIVE: Vitals:   06/07/24 0927  BP: 106/77  Pulse: 88  Resp: 16  Temp: (!) 96.6 F (35.9 C)  SpO2: 100%       Body mass index is 24.82 kg/m.    ECOG FS:1 - Symptomatic but completely ambulatory  General: Well-developed, well-nourished, no acute distress.  Sitting in a wheelchair. Eyes: Pink conjunctiva, anicteric sclera. HEENT: Normocephalic, moist mucous membranes. Lungs: No audible wheezing or coughing. Heart: Regular rate and rhythm. Abdomen: Soft, nontender, no obvious distention. Musculoskeletal: No edema, cyanosis, or clubbing. Neuro: Alert, answering all questions appropriately. Cranial nerves grossly intact. Skin: No rashes or petechiae noted. Psych: Normal affect.   LAB RESULTS:  Lab Results  Component Value Date   NA 132 (L) 06/07/2024   K 3.3 (L) 06/07/2024   CL 103 06/07/2024   CO2 18 (L) 06/07/2024   GLUCOSE 105 (H) 06/07/2024   BUN 37 (H) 06/07/2024   CREATININE 1.25 (H) 06/07/2024   CALCIUM  7.9 (L) 06/07/2024   PROT 6.2 (L) 06/07/2024   ALBUMIN  1.9 (L) 06/07/2024  AST 75 (H) 06/07/2024   ALT 15 06/07/2024   ALKPHOS 189 (H) 06/07/2024   BILITOT 2.8 (H) 06/07/2024   GFRNONAA 49 (L) 06/07/2024   GFRAA >60 06/09/2018    Lab Results  Component Value Date   WBC 7.7 06/07/2024   NEUTROABS PENDING 06/07/2024   HGB 7.1 (L) 06/07/2024   HCT 20.5 (L) 06/07/2024   MCV 79.5 (L) 06/07/2024   PLT 90 (L) 06/07/2024     STUDIES: CT CHEST ABDOMEN PELVIS W CONTRAST Result Date: 05/17/2024 CLINICAL DATA:  Metastatic colon cancer restaging * Tracking Code: BO * EXAM: CT CHEST, ABDOMEN, AND PELVIS WITH CONTRAST TECHNIQUE: Multidetector CT imaging of the chest, abdomen and pelvis was performed following the standard protocol during bolus administration of intravenous contrast. RADIATION DOSE REDUCTION: This exam was performed according to the departmental dose-optimization program which includes automated exposure control, adjustment of the mA  and/or kV according to patient size and/or use of iterative reconstruction technique. CONTRAST:  OMNIPAQUE  IOHEXOL  300 MG/ML SOLN additional oral enteric contrast COMPARISON:  CT abdomen pelvis, 02/07/2024, CT chest abdomen pelvis, 11/30/2023 FINDINGS: CT CHEST FINDINGS Cardiovascular: Right chest port catheter. Is a industrial delayed electrical normal heart size. No pericardial effusion. Mediastinum/Nodes: No enlarged mediastinal, hilar, or axillary lymph nodes. Thyroid  gland, trachea, and esophagus demonstrate no significant findings. Lungs/Pleura: When compared to most recent imaging of the chest dated 11/30/2023, numerous new and enlarged pulmonary nodules throughout the lungs, index nodule in the peripheral left apex measuring 1.8 x 1.2 cm, previously 0.9 x 0.7 cm (series 9, image 40). Numerous example new nodules in the medial right lung base measuring up to 0.7 cm (series 9, image 110). No pleural effusion or pneumothorax. Musculoskeletal: No chest wall abnormality. No acute osseous findings. CT ABDOMEN PELVIS FINDINGS Hepatobiliary: Compared to most recent imaging of the abdomen pelvis dated 02/07/2024, numerous treated, calcified and hypodense liver metastases are not significantly changed, however there are innumerable new rim enhancing metastases of varying sizes throughout the liver, for example a rim enhancing lesion in hepatic segment VIII measuring 1.7 x 1.7 cm (series 3, image 12) and a rim enhancing lesion in hepatic segment IV measuring 2.5 x 2.1 cm (series 3, image 21). Pseudocirrhotic capsular retraction. Contracted gallbladder. No gallstones, gallbladder wall thickening, or biliary dilatation. Pancreas: Unremarkable. No pancreatic ductal dilatation or surrounding inflammatory changes. Spleen: Normal in size without significant abnormality. Adrenals/Urinary Tract: Adrenal glands are unremarkable. Kidneys are normal, without renal calculi, solid lesion, or hydronephrosis. Bladder is  unremarkable. Stomach/Bowel: Stomach is within normal limits. Interval enlargement of cecal mass measuring 6.7 x 5.6 cm, previously 5.0 x 4.2 cm (series 3, image 55). Vascular/Lymphatic: Aortic atherosclerosis. Newly enlarged, necrotic appearing porta hepatis node measuring 2.6 x 2.2 cm (series 3, image 33). New necrotic appearing lymph node or soft tissue implant in the splenic hilum adjacent to the tip of the pancreatic tail measuring 2.2 x 1.7 cm (series 3, image 23). Newly enlarged retroperitoneal lymph nodes measuring up to 2.0 x 1.6 cm (series 3, image 40). 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 Other: Anasarca. New, large volume ascites throughout the abdomen and pelvis. Increased peritoneal caking and nodularity, particularly throughout the left upper quadrant (series 3, image 40). Musculoskeletal: No acute osseous findings. IMPRESSION: 1. Compared to most recent imaging of the chest dated 11/30/2023, numerous new and enlarged pulmonary nodules throughout the lungs, consistent  with worsened pulmonary metastatic disease. 2. Compared to most recent imaging of the abdomen and pelvis dated 02/07/2024, innumerable new rim enhancing metastases of varying sizes throughout the liver with additional background of unchanged, treated calcified and hypodense liver metastases. 3. New, large volume ascites throughout the abdomen and pelvis. Increased peritoneal caking and nodularity, particularly throughout the left upper quadrant, consistent with worsened peritoneal carcinomatosis. 4. Newly enlarged, necrotic appearing porta hepatis and retroperitoneal lymph nodes. New necrotic appearing lymph node or soft tissue implant in the splenic hilum adjacent to the tip of the pancreatic tail. 5. 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 6. Interval enlargement of  cecal mass. Aortic Atherosclerosis (ICD10-I70.0). Electronically Signed   By: Marolyn JONETTA Jaksch M.D.   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  Patient's CEA continues to trend up and is now 3110.  FOLFOX has been discontinued.  Despite mild neutropenia, proceed with cycle 1 of FOLFIRI.  Avastin  has been discontinued altogether given her history of bowel perforation and obstruction.  Return to clinic in 2 days for pump removal and Udenyca .  Patient with then return to clinic in 2 weeks for further evaluation and consideration of cycle 2.   Uterine masses: Patient reports she has a history of benign fibroids.  Appreciate gynecology input.  Monitor.   Neutropenia: ANC is 0.7.  Proceed cautiously with treatment as above.  Will add Udenyca  with each cycle. Anemia: Chronic and unchanged.  Patient's hemoglobin is 9.0 today. Hypokalemia: Resolved.  Continue oral potassium supplementation. Hyperbilirubinemia: Bilirubin trended up to 1.7.  Likely secondary to progressive disease.  Proceed with treatment as above.  Transaminitis: Patient has a chronically elevated AST.  Monitor.   Insomnia: Significantly improved.  Patient does not complain of this today. Coping/adjustment: Patient was previously given a referral to [facility]. Hypertension: Blood pressure was within normal limits today.   Diarrhea: Improved with OTC Imodium.  Patient was also given a prescription for Lomotil  today. Cramping: Continue Flexeril  as needed.    Patient expressed understanding and was in agreement with this plan. She also understands that She can call clinic at any time with any questions, concerns, or complaints.    Cancer Staging  Primary  adenocarcinoma of ascending colon [facility]) Staging form: Colon and Rectum, AJCC 8th Edition - Clinical stage from 08/17/2023: Stage IVB (cTX, cNX, pM1b) - Signed by Jacobo Evalene PARAS, MD on 08/17/2023 Stage prefix: Initial diagnosis   Evalene PARAS Jacobo, MD   06/07/2024 9:39 AM

## 2024-06-07 NOTE — Progress Notes (Signed)
 Patient tx  deferred   decreased plts  increase in srcr and bilirubin > 20 2.8   progression of disease.

## 2024-06-08 ENCOUNTER — Encounter: Payer: Self-pay | Admitting: Oncology

## 2024-06-08 ENCOUNTER — Inpatient Hospital Stay

## 2024-06-08 DIAGNOSIS — Z5111 Encounter for antineoplastic chemotherapy: Secondary | ICD-10-CM | POA: Diagnosis not present

## 2024-06-08 DIAGNOSIS — C182 Malignant neoplasm of ascending colon: Secondary | ICD-10-CM

## 2024-06-08 LAB — CEA: CEA: 712 ng/mL — ABNORMAL HIGH (ref 0.0–4.7)

## 2024-06-08 MED ORDER — SODIUM CHLORIDE 0.9% IV SOLUTION
250.0000 mL | INTRAVENOUS | Status: DC
  Administered 2024-06-08: 100 mL via INTRAVENOUS
  Filled 2024-06-08: qty 250

## 2024-06-08 MED ORDER — ACETAMINOPHEN 325 MG PO TABS
650.0000 mg | ORAL_TABLET | Freq: Once | ORAL | Status: AC
  Administered 2024-06-08: 650 mg via ORAL
  Filled 2024-06-08: qty 2

## 2024-06-08 MED ORDER — HEPARIN SOD (PORK) LOCK FLUSH 100 UNIT/ML IV SOLN
500.0000 [IU] | Freq: Every day | INTRAVENOUS | Status: AC | PRN
  Administered 2024-06-08: 500 [IU]
  Filled 2024-06-08: qty 5

## 2024-06-08 MED ORDER — DIPHENHYDRAMINE HCL 50 MG/ML IJ SOLN
25.0000 mg | Freq: Once | INTRAMUSCULAR | Status: AC
  Administered 2024-06-08: 25 mg via INTRAVENOUS
  Filled 2024-06-08: qty 1

## 2024-06-08 MED ORDER — SODIUM CHLORIDE 0.9% FLUSH
10.0000 mL | INTRAVENOUS | Status: AC | PRN
  Administered 2024-06-08: 10 mL
  Filled 2024-06-08: qty 10

## 2024-06-09 ENCOUNTER — Inpatient Hospital Stay

## 2024-06-09 ENCOUNTER — Encounter: Payer: Self-pay | Admitting: Oncology

## 2024-06-09 LAB — BPAM RBC
Blood Product Expiration Date: 202508232359
Blood Product Unit Number: 202508232359
ISSUE DATE / TIME: 202507301347
PRODUCT CODE: 202507301133
PRODUCT CODE: 202508232359
Unit Type and Rh: 202508232359
Unit Type and Rh: 7300
Unit Type and Rh: 7300
Unit Type and Rh: 7300

## 2024-06-09 LAB — TYPE AND SCREEN
ABO/RH(D): B POS
Antibody Screen: NEGATIVE
Unit division: 0
Unit division: 0

## 2024-06-10 ENCOUNTER — Other Ambulatory Visit: Payer: Self-pay | Admitting: *Deleted

## 2024-06-10 DIAGNOSIS — D649 Anemia, unspecified: Secondary | ICD-10-CM

## 2024-06-13 ENCOUNTER — Inpatient Hospital Stay (HOSPITAL_BASED_OUTPATIENT_CLINIC_OR_DEPARTMENT_OTHER): Admitting: Oncology

## 2024-06-13 ENCOUNTER — Inpatient Hospital Stay

## 2024-06-13 ENCOUNTER — Encounter: Payer: Self-pay | Admitting: Oncology

## 2024-06-13 ENCOUNTER — Inpatient Hospital Stay (HOSPITAL_BASED_OUTPATIENT_CLINIC_OR_DEPARTMENT_OTHER): Admitting: Hospice and Palliative Medicine

## 2024-06-13 ENCOUNTER — Inpatient Hospital Stay: Attending: Oncology

## 2024-06-13 VITALS — BP 111/74 | HR 53 | Temp 94.5°F | Resp 16

## 2024-06-13 VITALS — BP 114/84 | HR 84 | Temp 95.0°F | Resp 16 | Ht 66.0 in | Wt 149.9 lb

## 2024-06-13 DIAGNOSIS — E039 Hypothyroidism, unspecified: Secondary | ICD-10-CM | POA: Insufficient documentation

## 2024-06-13 DIAGNOSIS — Z5189 Encounter for other specified aftercare: Secondary | ICD-10-CM | POA: Insufficient documentation

## 2024-06-13 DIAGNOSIS — D649 Anemia, unspecified: Secondary | ICD-10-CM | POA: Insufficient documentation

## 2024-06-13 DIAGNOSIS — C182 Malignant neoplasm of ascending colon: Secondary | ICD-10-CM

## 2024-06-13 DIAGNOSIS — I10 Essential (primary) hypertension: Secondary | ICD-10-CM | POA: Diagnosis not present

## 2024-06-13 DIAGNOSIS — E876 Hypokalemia: Secondary | ICD-10-CM | POA: Insufficient documentation

## 2024-06-13 DIAGNOSIS — R7401 Elevation of levels of liver transaminase levels: Secondary | ICD-10-CM | POA: Insufficient documentation

## 2024-06-13 DIAGNOSIS — C787 Secondary malignant neoplasm of liver and intrahepatic bile duct: Secondary | ICD-10-CM | POA: Diagnosis not present

## 2024-06-13 DIAGNOSIS — G4700 Insomnia, unspecified: Secondary | ICD-10-CM | POA: Insufficient documentation

## 2024-06-13 DIAGNOSIS — R102 Pelvic and perineal pain: Secondary | ICD-10-CM | POA: Diagnosis not present

## 2024-06-13 DIAGNOSIS — D529 Folate deficiency anemia, unspecified: Secondary | ICD-10-CM | POA: Diagnosis not present

## 2024-06-13 DIAGNOSIS — C7800 Secondary malignant neoplasm of unspecified lung: Secondary | ICD-10-CM | POA: Diagnosis not present

## 2024-06-13 DIAGNOSIS — R197 Diarrhea, unspecified: Secondary | ICD-10-CM | POA: Insufficient documentation

## 2024-06-13 DIAGNOSIS — Z5111 Encounter for antineoplastic chemotherapy: Secondary | ICD-10-CM | POA: Diagnosis present

## 2024-06-13 DIAGNOSIS — Z87891 Personal history of nicotine dependence: Secondary | ICD-10-CM | POA: Insufficient documentation

## 2024-06-13 DIAGNOSIS — M549 Dorsalgia, unspecified: Secondary | ICD-10-CM | POA: Diagnosis not present

## 2024-06-13 LAB — CMP (CANCER CENTER ONLY)
ALT: 19 U/L (ref 0–44)
AST: 137 U/L — ABNORMAL HIGH (ref 15–41)
Albumin: 2 g/dL — ABNORMAL LOW (ref 3.5–5.0)
Alkaline Phosphatase: 271 U/L — ABNORMAL HIGH (ref 38–126)
Anion gap: 9 (ref 5–15)
BUN: 24 mg/dL — ABNORMAL HIGH (ref 6–20)
CO2: 20 mmol/L — ABNORMAL LOW (ref 22–32)
Calcium: 8 mg/dL — ABNORMAL LOW (ref 8.9–10.3)
Chloride: 109 mmol/L (ref 98–111)
Creatinine: 0.96 mg/dL (ref 0.44–1.00)
GFR, Estimated: >60 mL/min (ref >60)
Glucose, Bld: 107 mg/dL — ABNORMAL HIGH (ref 70–99)
Potassium: 3.3 mmol/L — ABNORMAL LOW (ref 3.5–5.1)
Sodium: 138 mmol/L (ref 135–145)
Total Bilirubin: 2.5 mg/dL — ABNORMAL HIGH (ref 0.0–1.2)
Total Protein: 6.6 g/dL (ref 6.5–8.1)

## 2024-06-13 LAB — SAMPLE TO BLOOD BANK

## 2024-06-13 LAB — CBC WITH DIFFERENTIAL (CANCER CENTER ONLY)
Abs Immature Granulocytes: 1.01 K/uL — ABNORMAL HIGH (ref 0.00–0.07)
Basophils Absolute: 0.1 K/uL (ref 0.0–0.1)
Basophils Relative: 1 %
Eosinophils Absolute: 0.1 K/uL (ref 0.0–0.5)
Eosinophils Relative: 0 %
HCT: 33.2 % — ABNORMAL LOW (ref 36.0–46.0)
Hemoglobin: 11.1 g/dL — ABNORMAL LOW (ref 12.0–15.0)
Immature Granulocytes: 5 %
Lymphocytes Relative: 4 %
Lymphs Abs: 1 K/uL (ref 0.7–4.0)
MCH: 28 pg (ref 26.0–34.0)
MCHC: 33.4 g/dL (ref 30.0–36.0)
MCV: 83.8 fL (ref 80.0–100.0)
Monocytes Absolute: 0.9 K/uL (ref 0.1–1.0)
Monocytes Relative: 4 %
Neutro Abs: 19.1 K/uL — ABNORMAL HIGH (ref 1.7–7.7)
Neutrophils Relative %: 86 %
Platelet Count: 247 K/uL (ref 150–400)
RBC: 3.96 MIL/uL (ref 3.87–5.11)
RDW: 22.6 % — ABNORMAL HIGH (ref 11.5–15.5)
Smear Review: NORMAL
WBC Count: 22.1 K/uL — ABNORMAL HIGH (ref 4.0–10.5)
nRBC: 0.2 % (ref 0.0–0.2)

## 2024-06-13 MED ORDER — ATROPINE SULFATE 1 MG/ML IV SOLN
0.5000 mg | Freq: Once | INTRAVENOUS | Status: AC | PRN
  Administered 2024-06-13: 0.5 mg via INTRAVENOUS
  Filled 2024-06-13: qty 1

## 2024-06-13 MED ORDER — DEXAMETHASONE SODIUM PHOSPHATE 10 MG/ML IJ SOLN
10.0000 mg | Freq: Once | INTRAMUSCULAR | Status: AC
  Administered 2024-06-13: 10 mg via INTRAVENOUS
  Filled 2024-06-13: qty 1

## 2024-06-13 MED ORDER — FLUOROURACIL CHEMO INJECTION 2.5 GM/50ML
400.0000 mg/m2 | Freq: Once | INTRAVENOUS | Status: AC
  Administered 2024-06-13: 700 mg via INTRAVENOUS
  Filled 2024-06-13: qty 14

## 2024-06-13 MED ORDER — SODIUM CHLORIDE 0.9 % IV SOLN
135.0000 mg/m2 | Freq: Once | INTRAVENOUS | Status: AC
  Administered 2024-06-13: 240 mg via INTRAVENOUS
  Filled 2024-06-13: qty 10

## 2024-06-13 MED ORDER — SODIUM CHLORIDE 0.9 % IV SOLN
INTRAVENOUS | Status: DC
  Filled 2024-06-13: qty 250

## 2024-06-13 MED ORDER — SODIUM CHLORIDE 0.9 % IV SOLN
2400.0000 mg/m2 | INTRAVENOUS | Status: DC
  Administered 2024-06-13: 4350 mg via INTRAVENOUS
  Filled 2024-06-13: qty 87

## 2024-06-13 MED ORDER — SODIUM CHLORIDE 0.9 % IV SOLN
400.0000 mg/m2 | Freq: Once | INTRAVENOUS | Status: AC
  Administered 2024-06-13: 724 mg via INTRAVENOUS
  Filled 2024-06-13: qty 25

## 2024-06-13 MED ORDER — SODIUM CHLORIDE 0.9 % IV SOLN: 180.0000 mg/m2 | Freq: Once | INTRAVENOUS | Status: DC

## 2024-06-13 MED ORDER — DOXYCYCLINE HYCLATE 100 MG PO TABS: 100.0000 mg | ORAL_TABLET | Freq: Two times a day (BID) | ORAL | 0 refills | Status: DC

## 2024-06-13 MED ORDER — PALONOSETRON HCL INJECTION 0.25 MG/5ML
0.2500 mg | Freq: Once | INTRAVENOUS | Status: AC
  Administered 2024-06-13: 0.25 mg via INTRAVENOUS
  Filled 2024-06-13: qty 5

## 2024-06-13 NOTE — Progress Notes (Signed)
 Per Dr. Jacobo,   Irinitotecan dose reduced by 25% for elevated bilirubin of 2.5   Maudie FORBES Andreas, PharmD, BCPS Clinical Pharmacist

## 2024-06-13 NOTE — Progress Notes (Signed)
 Patient was coughing last night and noticed some blood. She is starting to bleed with some puss from her incision site in her abdomen. She would like to see if Dr. Georgina could prescribe her a low dose of her thyroid  medication.

## 2024-06-13 NOTE — Progress Notes (Signed)
 Palliative Medicine [facility] at [facility] [phone] [phone]) [phone]   Name: Alexis Garcia Date: 06/13/2024 MRN: [id number]  DOB: 07/13/1961  Patient Care Team: Donal Channing SQUIBB, FNP as PCP - General (Family Medicine) Maurie Rayfield BIRCH, RN as Oncology Nurse Navigator Jacobo, Evalene PARAS, MD as Consulting Physician (Oncology)    REASON FOR CONSULTATION: Alexis Garcia is a 63 y.o. 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  Patient hospitalized in April 2025 with SBO and perforation due to strangulated ventral hernia.  Patient underwent surgical repair.  Palliative care consulted to address goals manage ongoing symptoms.  SOCIAL HISTORY:     reports that she quit smoking about 19 months ago. Her smoking use included cigarettes. She has never used smokeless tobacco. She reports that she does not drink alcohol and does not use drugs.  Patient is divorced and lives at home alone.  Her oldest daughter has lung cancer.  She has a son who lives nearby and another daughter in [place] .  Patient worked at [employer] in [employer].  ADVANCE DIRECTIVES:    CODE STATUS:   PAST MEDICAL HISTORY: Past Medical History:  Diagnosis Date   Abnormal uterine bleeding    Anemia    Anxiety    Asthma    Cardiomegaly    Cardiovascular disease    COPD (chronic obstructive pulmonary disease) (HCC)    Depression    Diabetes mellitus without complication (HCC)    Dyspnea    Goiter, nontoxic, multinodular    Heart murmur    Hypertension    Hypothyroidism    Lung cancer, primary, with metastasis from lung to other site [facility])    Malignant neoplasm of ascending colon (HCC)    Primary adenocarcinoma of ascending colon (HCC)    Syncopal episodes    Thyrotoxicosis    Uterine leiomyoma     PAST SURGICAL HISTORY:  Past Surgical History:  Procedure  Laterality Date   BIOPSY  08/12/2023   Procedure: BIOPSY;  Surgeon: Aundria, Ladell POUR, MD;  Location: [facility] ENDOSCOPY;  Service: Gastroenterology;;   CESAREAN SECTION     COLONOSCOPY N/A 08/12/2023   Procedure: COLONOSCOPY;  Surgeon: Toledo, Ladell POUR, MD;  Location: [facility] ENDOSCOPY;  Service: Gastroenterology;  Laterality: N/A;  3rd patient   COLONOSCOPY WITH PROPOFOL  N/A 10/29/2015   Procedure: COLONOSCOPY WITH PROPOFOL ;  Surgeon: Donnice Vaughn Manes, MD;  Location: [facility] ENDOSCOPY;  Service: Endoscopy;  Laterality: N/A;   DILITATION & CURRETTAGE/HYSTROSCOPY WITH NOVASURE ABLATION N/A 06/25/2018   Procedure: DILATATION & CURETTAGE/HYSTEROSCOPY WITH MINERVA ABLATION;  Surgeon: Connell Davies, MD;  Location: [facility] ORS;  Service: Gynecology;  Laterality: N/A;   LAPAROTOMY N/A 02/07/2024   Procedure: LAPAROTOMY, EXPLORATORY SMALL BOWEL RESECTION HERNIA REPAIR;  Surgeon: Jordis Laneta JULIANNA, MD;  Location: [facility] ORS;  Service: General;  Laterality: N/A;   PORTACATH PLACEMENT N/A 08/26/2023   Procedure: INSERTION PORT-A-CATH;  Surgeon: Rodolph Romano, MD;  Location: [facility] ORS;  Service: General;  Laterality: N/A;   SUBMUCOSAL TATTOO INJECTION  08/12/2023   Procedure: SUBMUCOSAL TATTOO INJECTION;  Surgeon: Toledo, Ladell POUR, MD;  Location: [facility] ENDOSCOPY;  Service: Gastroenterology;;   TUBAL LIGATION      HEMATOLOGY/ONCOLOGY HISTORY:  Oncology History  Primary adenocarcinoma of ascending colon (HCC)  08/17/2023 Initial Diagnosis   Primary adenocarcinoma of ascending colon (HCC)   08/17/2023 Cancer Staging   Staging form: Colon and Rectum, AJCC 8th  Edition - Clinical stage from 08/17/2023: Stage IVB (cTX, cNX, pM1b) - Signed by Jacobo Evalene PARAS, MD on 08/17/2023 Stage prefix: Initial diagnosis   08/31/2023 - 05/11/2024 Chemotherapy   Patient is on Treatment Plan : COLORECTAL FOLFOX q14d     05/23/2024 -  Chemotherapy   Patient is on Treatment Plan : COLORECTAL FOLFIRI q14d       ALLERGIES:  has  no known allergies.  MEDICATIONS:  Current Outpatient Medications  Medication Sig Dispense Refill   albuterol  (PROVENTIL  HFA;VENTOLIN  HFA) 108 (90 Base) MCG/ACT inhaler Inhale 2 puffs into the lungs every 6 (six) hours as needed (for exercise induced asthma).     atorvastatin  (LIPITOR) 10 MG tablet SMARTSIG:1 Tablet(s) By Mouth Every Evening     cyclobenzaprine  (FLEXERIL ) 10 MG tablet Take 1 tablet (10 mg total) by mouth 3 (three) times daily as needed for muscle spasms. 30 tablet 0   dexamethasone  (DECADRON ) 4 MG tablet Take 1 tablet (4 mg total) by mouth daily. 30 tablet 2   diphenoxylate -atropine  (LOMOTIL ) 2.5-0.025 MG tablet Take 1 tablet by mouth 4 (four) times daily as needed for diarrhea or loose stools. 30 tablet 0   doxycycline  (VIBRA -TABS) 100 MG tablet Take 1 tablet (100 mg total) by mouth 2 (two) times daily. 14 tablet 0   ezetimibe (ZETIA) 10 MG tablet Take 10 mg by mouth daily.     hydrALAZINE  (APRESOLINE ) 10 MG tablet Take 10 mg by mouth 3 (three) times daily as needed.     HYDROcodone -acetaminophen  (NORCO/VICODIN) 5-325 MG tablet Take 1-2 tablets by mouth every 4 (four) hours as needed for moderate pain (pain score 4-6). 90 tablet 0   levothyroxine  (SYNTHROID ) 125 MCG tablet Take 125 mcg by mouth daily before breakfast. (Patient not taking: Reported on 06/13/2024)     metFORMIN (GLUCOPHAGE) 500 MG tablet Take 500 mg by mouth 2 (two) times daily.      metoprolol tartrate (LOPRESSOR) 25 MG tablet Take 25 mg by mouth 2 (two) times daily.     Multiple Vitamins-Minerals (ALIVE ONCE DAILY WOMENS PO) Take 2 tablets by mouth daily. Gummies     pantoprazole  (PROTONIX ) 40 MG tablet Take 1 tablet (40 mg total) by mouth daily. 30 tablet 0   potassium chloride  SA (KLOR-CON  M) 20 MEQ tablet Take 1 tablet (20 mEq total) by mouth daily. 30 tablet 1   No current facility-administered medications for this visit.   Facility-Administered Medications Ordered in Other Visits  Medication Dose Route  Frequency Provider Last Rate Last Admin   0.9 %  sodium chloride  infusion   Intravenous Continuous Finnegan, Timothy J, MD 10 mL/hr at 06/13/24 0949 New Bag at 06/13/24 0949   fluorouracil  (ADRUCIL ) 4,350 mg in sodium chloride  0.9 % 63 mL chemo infusion  2,400 mg/m2 (Treatment Plan Recorded) Intravenous 1 day or 1 dose Finnegan, Timothy J, MD       fluorouracil  (ADRUCIL ) chemo injection 700 mg  400 mg/m2 (Treatment Plan Recorded) Intravenous Once Finnegan, Timothy J, MD       irinotecan  (CAMPTOSAR ) 240 mg in sodium chloride  0.9 % 500 mL chemo infusion  135 mg/m2 (Treatment Plan Recorded) Intravenous Once Finnegan, Timothy J, MD 341 mL/hr at 06/13/24 1038 240 mg at 06/13/24 1038   leucovorin  724 mg in sodium chloride  0.9 % 250 mL infusion  400 mg/m2 (Treatment Plan Recorded) Intravenous Once Finnegan, Timothy J, MD 191 mL/hr at 06/13/24 1035 724 mg at 06/13/24 1035    VITAL SIGNS: There were no vitals taken for  this visit. There were no vitals filed for this visit.  Estimated body mass index is 24.19 kg/m as calculated from the following:   Height as of an earlier encounter on 06/13/24: 5' 6 (1.676 m).   Weight as of an earlier encounter on 06/13/24: 149 lb 14.4 oz (68 kg).  LABS: CBC:    Component Value Date/Time   WBC 22.1 (H) 06/13/2024 0824   WBC 4.4 03/24/2024 1039   HGB 11.1 (L) 06/13/2024 0824   HGB 13.7 11/22/2021 0912   HCT 33.2 (L) 06/13/2024 0824   HCT 40.3 11/22/2021 0912   PLT 247 06/13/2024 0824   PLT 240 11/22/2021 0912   MCV 83.8 06/13/2024 0824   MCV 89 11/22/2021 0912   NEUTROABS 19.1 (H) 06/13/2024 0824   NEUTROABS 2.5 11/22/2021 0912   LYMPHSABS 1.0 06/13/2024 0824   LYMPHSABS 1.9 11/22/2021 0912   MONOABS 0.9 06/13/2024 0824   EOSABS 0.1 06/13/2024 0824   EOSABS 0.1 11/22/2021 0912   BASOSABS 0.1 06/13/2024 0824   BASOSABS 0.0 11/22/2021 0912   Comprehensive Metabolic Panel:    Component Value Date/Time   NA 138 06/13/2024 0824   NA 141 09/13/2021 0857    K 3.3 (L) 06/13/2024 0824   CL 109 06/13/2024 0824   CO2 20 (L) 06/13/2024 0824   BUN 24 (H) 06/13/2024 0824   BUN 17 09/13/2021 0857   CREATININE 0.96 06/13/2024 0824   GLUCOSE 107 (H) 06/13/2024 0824   CALCIUM  8.0 (L) 06/13/2024 0824   AST 137 (H) 06/13/2024 0824   ALT 19 06/13/2024 0824   ALKPHOS 271 (H) 06/13/2024 0824   BILITOT 2.5 (H) 06/13/2024 0824   PROT 6.6 06/13/2024 0824   PROT 7.5 09/13/2021 0857   ALBUMIN  2.0 (L) 06/13/2024 0824   ALBUMIN  4.6 09/13/2021 0857    RADIOGRAPHIC STUDIES: CT CHEST ABDOMEN PELVIS W CONTRAST Result Date: 05/17/2024 CLINICAL DATA:  Metastatic colon cancer restaging * Tracking Code: BO * EXAM: CT CHEST, ABDOMEN, AND PELVIS WITH CONTRAST TECHNIQUE: Multidetector CT imaging of the chest, abdomen and pelvis was performed following the standard protocol during bolus administration of intravenous contrast. RADIATION DOSE REDUCTION: This exam was performed according to the departmental dose-optimization program which includes automated exposure control, adjustment of the mA and/or kV according to patient size and/or use of iterative reconstruction technique. CONTRAST:  OMNIPAQUE  IOHEXOL  300 MG/ML SOLN additional oral enteric contrast COMPARISON:  CT abdomen pelvis, 02/07/2024, CT chest abdomen pelvis, 11/30/2023 FINDINGS: CT CHEST FINDINGS Cardiovascular: Right chest port catheter. Is a industrial delayed electrical normal heart size. No pericardial effusion. Mediastinum/Nodes: No enlarged mediastinal, hilar, or axillary lymph nodes. Thyroid  gland, trachea, and esophagus demonstrate no significant findings. Lungs/Pleura: When compared to most recent imaging of the chest dated 11/30/2023, numerous new and enlarged pulmonary nodules throughout the lungs, index nodule in the peripheral left apex measuring 1.8 x 1.2 cm, previously 0.9 x 0.7 cm (series 9, image 40). Numerous example new nodules in the medial right lung base measuring up to 0.7 cm (series 9, image  110). No pleural effusion or pneumothorax. Musculoskeletal: No chest wall abnormality. No acute osseous findings. CT ABDOMEN PELVIS FINDINGS Hepatobiliary: Compared to most recent imaging of the abdomen pelvis dated 02/07/2024, numerous treated, calcified and hypodense liver metastases are not significantly changed, however there are innumerable new rim enhancing metastases of varying sizes throughout the liver, for example a rim enhancing lesion in hepatic segment VIII measuring 1.7 x 1.7 cm (series 3, image 12) and a rim enhancing  lesion in hepatic segment IV measuring 2.5 x 2.1 cm (series 3, image 21). Pseudocirrhotic capsular retraction. Contracted gallbladder. No gallstones, gallbladder wall thickening, or biliary dilatation. Pancreas: Unremarkable. No pancreatic ductal dilatation or surrounding inflammatory changes. Spleen: Normal in size without significant abnormality. Adrenals/Urinary Tract: Adrenal glands are unremarkable. Kidneys are normal, without renal calculi, solid lesion, or hydronephrosis. Bladder is unremarkable. Stomach/Bowel: Stomach is within normal limits. Interval enlargement of cecal mass measuring 6.7 x 5.6 cm, previously 5.0 x 4.2 cm (series 3, image 55). Vascular/Lymphatic: Aortic atherosclerosis. Newly enlarged, necrotic appearing porta hepatis node measuring 2.6 x 2.2 cm (series 3, image 33). New necrotic appearing lymph node or soft tissue implant in the splenic hilum adjacent to the tip of the pancreatic tail measuring 2.2 x 1.7 cm (series 3, image 23). Newly enlarged retroperitoneal lymph nodes measuring up to 2.0 x 1.6 cm (series 3, image 40). 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 Other: Anasarca. New, large volume ascites throughout the abdomen and pelvis. Increased peritoneal caking and nodularity, particularly throughout the left upper quadrant (series 3,  image 40). Musculoskeletal: No acute osseous findings. IMPRESSION: 1. Compared to most recent imaging of the chest dated 11/30/2023, numerous new and enlarged pulmonary nodules throughout the lungs, consistent with worsened pulmonary metastatic disease. 2. Compared to most recent imaging of the abdomen and pelvis dated 02/07/2024, innumerable new rim enhancing metastases of varying sizes throughout the liver with additional background of unchanged, treated calcified and hypodense liver metastases. 3. New, large volume ascites throughout the abdomen and pelvis. Increased peritoneal caking and nodularity, particularly throughout the left upper quadrant, consistent with worsened peritoneal carcinomatosis. 4. Newly enlarged, necrotic appearing porta hepatis and retroperitoneal lymph nodes. New necrotic appearing lymph node or soft tissue implant in the splenic hilum adjacent to the tip of the pancreatic tail. 5. 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 6. Interval enlargement of cecal mass. Aortic Atherosclerosis (ICD10-I70.0). Electronically Signed   By: Marolyn JONETTA Jaksch M.D.   On: 05/17/2024 07:52    PERFORMANCE STATUS (ECOG) : 1 - Symptomatic but completely ambulatory  Review of Systems Unless otherwise noted, a complete review of systems is negative.  Physical Exam General: NAD Pulmonary: Unlabored Extremities: no edema, no joint deformities Skin: no rashes Neurological: nonfocal  IMPRESSION: Follow-up visit.  Patient seen in infusion.  Patient says that she has been weak over the past several weeks.  She has had several falls.  She ambulates with use of a walker but is also often in a wheelchair.  She says she is requiring more assistance at home including hiring a CNA to help when family are unavailable.  Patient attributes her weakness to increased dose of Synthroid  by her PCP.  She says that she has stopped the Synthroid  and feels her weakness is  improving.  TSH is pending.  Patient says that she is reaching out to her PCP regarding further management.  Patient agreeable to having home palliative care involved.  Her goals are still aligned with continued cancer treatments.  I encouraged patient to review MOST form with family.  She says that she has started some discussions about end-of-life decision making.  PLAN: -Continue current scope of treatment - Patient speaking with family about decision making - MOST Form reviewed - Referral to community palliative care - Follow-up 3 to 4 weeks  Case and plan  discussed with Dr. Jacobo  Patient expressed understanding and was in agreement with this plan. She also understands that She can call the clinic at any time with any questions, concerns, or complaints.     Time Total: 20 minutes  Visit consisted of counseling and education dealing with the complex and emotionally intense issues of symptom management and palliative care in the setting of serious and potentially life-threatening illness.Greater than 50%  of this time was spent counseling and coordinating care related to the above assessment and plan.  Signed by: Fonda Mower, PhD, NP-C

## 2024-06-13 NOTE — Progress Notes (Signed)
 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  INTERVAL HISTORY: Patient returns to clinic today for further evaluation and reconsideration of cycle 2 of FOLFIRI.  She continues to have weakness and fatigue, but this has improved since receiving her blood transfusion last week.  She continues to have a poor appetite.  She does not complain of pain today.  She continues to have diarrhea, but states it is well-controlled with Imodium.  She has no neurologic complaints.  She denies any recent fevers or illnesses. She has no chest pain, shortness of breath, cough, or hemoptysis.  She denies any nausea, vomiting, or constipation.  She has no melena or hematochezia.  She has no urinary complaints.  Patient offers no further specific complaints today.  REVIEW OF SYSTEMS:   Review of Systems  Constitutional:  Positive for malaise/fatigue and weight loss. Negative for fever.  Respiratory: Negative.  Negative for cough, hemoptysis and shortness of breath.   Cardiovascular: Negative.  Negative for chest pain and leg swelling.  Gastrointestinal:  Positive for diarrhea. Negative for abdominal pain, blood in stool, constipation, melena, nausea and vomiting.  Genitourinary: Negative.  Negative for dysuria.  Musculoskeletal:  Positive for back pain. Negative for falls.  Neurological:  Positive for weakness. Negative for dizziness, focal weakness and headaches.  Psychiatric/Behavioral: Negative.  The patient is not nervous/anxious.     As per HPI. Otherwise, a complete review of systems is negative.  PAST MEDICAL HISTORY: Past  Medical History:  Diagnosis Date   Abnormal uterine bleeding    Anemia    Anxiety    Asthma    Cardiomegaly    Cardiovascular disease    COPD (chronic obstructive pulmonary disease) (HCC)    Depression    Diabetes mellitus without complication (HCC)    Dyspnea    Goiter, nontoxic, multinodular    Heart murmur    Hypertension    Hypothyroidism    Lung cancer, primary, with metastasis from lung to other site [facility])    Malignant neoplasm of ascending colon (HCC)    Primary adenocarcinoma of ascending colon (HCC)    Syncopal episodes    Thyrotoxicosis    Uterine leiomyoma     PAST SURGICAL HISTORY: Past Surgical History:  Procedure Laterality Date   BIOPSY  08/12/2023   Procedure: BIOPSY;  Surgeon: Aundria, Ladell POUR, MD;  Location: [facility] ENDOSCOPY;  Service: Gastroenterology;;   CESAREAN SECTION     COLONOSCOPY N/A 08/12/2023   Procedure: COLONOSCOPY;  Surgeon: Toledo, Ladell POUR, MD;  Location: [facility] ENDOSCOPY;  Service: Gastroenterology;  Laterality: N/A;  3rd patient   COLONOSCOPY WITH PROPOFOL  N/A 10/29/2015   Procedure: COLONOSCOPY WITH PROPOFOL ;  Surgeon: Donnice Vaughn Manes, MD;  Location: [facility] ENDOSCOPY;  Service: Endoscopy;  Laterality: N/A;   DILITATION & CURRETTAGE/HYSTROSCOPY WITH NOVASURE ABLATION N/A 06/25/2018   Procedure: DILATATION & CURETTAGE/HYSTEROSCOPY WITH MINERVA ABLATION;  Surgeon: Connell Davies, MD;  Location: [facility] ORS;  Service: Gynecology;  Laterality: N/A;   LAPAROTOMY N/A 02/07/2024   Procedure: LAPAROTOMY, EXPLORATORY SMALL BOWEL RESECTION HERNIA REPAIR;  Surgeon: Jordis Laneta JULIANNA, MD;  Location: [facility] ORS;  Service: General;  Laterality: N/A;   PORTACATH PLACEMENT N/A 08/26/2023  Procedure: INSERTION PORT-A-CATH;  Surgeon: Rodolph Romano, MD;  Location: [facility] ORS;  Service: General;  Laterality: N/A;   SUBMUCOSAL TATTOO INJECTION  08/12/2023   Procedure: SUBMUCOSAL TATTOO INJECTION;  Surgeon: Toledo, Ladell POUR, MD;  Location: [facility] ENDOSCOPY;   Service: Gastroenterology;;   TUBAL LIGATION      FAMILY HISTORY: Family History  Adopted: Yes  Problem Relation Age of Onset   Breast cancer Neg Hx     ADVANCED DIRECTIVES (Y/N):  N  HEALTH MAINTENANCE: Social History   Tobacco Use   Smoking status: Former    Current packs/day: 0.00    Types: Cigarettes    Quit date: 2024    Years since quitting: 1.5   Smokeless tobacco: Never  Vaping Use   Vaping status: Former  Substance Use Topics   Alcohol use: Never   Drug use: Never     Colonoscopy:  PAP:  Bone density:  Lipid panel:  No Known Allergies  Current Outpatient Medications  Medication Sig Dispense Refill   albuterol  (PROVENTIL  HFA;VENTOLIN  HFA) 108 (90 Base) MCG/ACT inhaler Inhale 2 puffs into the lungs every 6 (six) hours as needed (for exercise induced asthma).     atorvastatin  (LIPITOR) 10 MG tablet SMARTSIG:1 Tablet(s) By Mouth Every Evening     cyclobenzaprine  (FLEXERIL ) 10 MG tablet Take 1 tablet (10 mg total) by mouth 3 (three) times daily as needed for muscle spasms. 30 tablet 0   dexamethasone  (DECADRON ) 4 MG tablet Take 1 tablet (4 mg total) by mouth daily. 30 tablet 2   diphenoxylate -atropine  (LOMOTIL ) 2.5-0.025 MG tablet Take 1 tablet by mouth 4 (four) times daily as needed for diarrhea or loose stools. 30 tablet 0   doxycycline  (VIBRA -TABS) 100 MG tablet Take 1 tablet (100 mg total) by mouth 2 (two) times daily. 14 tablet 0   ezetimibe (ZETIA) 10 MG tablet Take 10 mg by mouth daily.     hydrALAZINE  (APRESOLINE ) 10 MG tablet Take 10 mg by mouth 3 (three) times daily as needed.     HYDROcodone -acetaminophen  (NORCO/VICODIN) 5-325 MG tablet Take 1-2 tablets by mouth every 4 (four) hours as needed for moderate pain (pain score 4-6). 90 tablet 0   metFORMIN (GLUCOPHAGE) 500 MG tablet Take 500 mg by mouth 2 (two) times daily.      metoprolol tartrate (LOPRESSOR) 25 MG tablet Take 25 mg by mouth 2 (two) times daily.     Multiple Vitamins-Minerals (ALIVE ONCE  DAILY WOMENS PO) Take 2 tablets by mouth daily. Gummies     pantoprazole  (PROTONIX ) 40 MG tablet Take 1 tablet (40 mg total) by mouth daily. 30 tablet 0   potassium chloride  SA (KLOR-CON  M) 20 MEQ tablet Take 1 tablet (20 mEq total) by mouth daily. 30 tablet 1   levothyroxine  (SYNTHROID ) 125 MCG tablet Take 125 mcg by mouth daily before breakfast. (Patient not taking: Reported on 06/13/2024)     No current facility-administered medications for this visit.    OBJECTIVE: Vitals:   06/13/24 0851  BP: 114/84  Pulse: 84  Resp: 16  Temp: (!) 95 F (35 C)  SpO2: 100%       Body mass index is 24.19 kg/m.    ECOG FS:2 - Symptomatic, <50% confined to bed  General: Thin, no acute distress.  No acute distress. Eyes: Pink conjunctiva, anicteric sclera. HEENT: Normocephalic, moist mucous membranes. Lungs: No audible wheezing or coughing. Heart: Regular rate and rhythm. Abdomen: Soft, nontender, no obvious distention.  Surgical wound slightly open with minimal pus draining.  Musculoskeletal: No edema, cyanosis, or clubbing. Neuro: Alert, answering all questions appropriately. Cranial nerves grossly intact. Skin: No rashes or petechiae noted. Psych: Normal affect.   LAB RESULTS:  Lab Results  Component Value Date   NA 138 06/13/2024   K 3.3 (L) 06/13/2024   CL 109 06/13/2024   CO2 20 (L) 06/13/2024   GLUCOSE 107 (H) 06/13/2024   BUN 24 (H) 06/13/2024   CREATININE 0.96 06/13/2024   CALCIUM  8.0 (L) 06/13/2024   PROT 6.6 06/13/2024   ALBUMIN  2.0 (L) 06/13/2024   AST 137 (H) 06/13/2024   ALT 19 06/13/2024   ALKPHOS 271 (H) 06/13/2024   BILITOT 2.5 (H) 06/13/2024   GFRNONAA >60 06/13/2024   GFRAA >60 06/09/2018    Lab Results  Component Value Date   WBC 22.1 (H) 06/13/2024   NEUTROABS PENDING 06/13/2024   HGB 11.1 (L) 06/13/2024   HCT 33.2 (L) 06/13/2024   MCV 83.8 06/13/2024   PLT 247 06/13/2024     STUDIES: CT CHEST ABDOMEN PELVIS W CONTRAST Result Date:  05/17/2024 CLINICAL DATA:  Metastatic colon cancer restaging * Tracking Code: BO * EXAM: CT CHEST, ABDOMEN, AND PELVIS WITH CONTRAST TECHNIQUE: Multidetector CT imaging of the chest, abdomen and pelvis was performed following the standard protocol during bolus administration of intravenous contrast. RADIATION DOSE REDUCTION: This exam was performed according to the departmental dose-optimization program which includes automated exposure control, adjustment of the mA and/or kV according to patient size and/or use of iterative reconstruction technique. CONTRAST:  OMNIPAQUE  IOHEXOL  300 MG/ML SOLN additional oral enteric contrast COMPARISON:  CT abdomen pelvis, 02/07/2024, CT chest abdomen pelvis, 11/30/2023 FINDINGS: CT CHEST FINDINGS Cardiovascular: Right chest port catheter. Is a industrial delayed electrical normal heart size. No pericardial effusion. Mediastinum/Nodes: No enlarged mediastinal, hilar, or axillary lymph nodes. Thyroid  gland, trachea, and esophagus demonstrate no significant findings. Lungs/Pleura: When compared to most recent imaging of the chest dated 11/30/2023, numerous new and enlarged pulmonary nodules throughout the lungs, index nodule in the peripheral left apex measuring 1.8 x 1.2 cm, previously 0.9 x 0.7 cm (series 9, image 40). Numerous example new nodules in the medial right lung base measuring up to 0.7 cm (series 9, image 110). No pleural effusion or pneumothorax. Musculoskeletal: No chest wall abnormality. No acute osseous findings. CT ABDOMEN PELVIS FINDINGS Hepatobiliary: Compared to most recent imaging of the abdomen pelvis dated 02/07/2024, numerous treated, calcified and hypodense liver metastases are not significantly changed, however there are innumerable new rim enhancing metastases of varying sizes throughout the liver, for example a rim enhancing lesion in hepatic segment VIII measuring 1.7 x 1.7 cm (series 3, image 12) and a rim enhancing lesion in hepatic segment IV  measuring 2.5 x 2.1 cm (series 3, image 21). Pseudocirrhotic capsular retraction. Contracted gallbladder. No gallstones, gallbladder wall thickening, or biliary dilatation. Pancreas: Unremarkable. No pancreatic ductal dilatation or surrounding inflammatory changes. Spleen: Normal in size without significant abnormality. Adrenals/Urinary Tract: Adrenal glands are unremarkable. Kidneys are normal, without renal calculi, solid lesion, or hydronephrosis. Bladder is unremarkable. Stomach/Bowel: Stomach is within normal limits. Interval enlargement of cecal mass measuring 6.7 x 5.6 cm, previously 5.0 x 4.2 cm (series 3, image 55). Vascular/Lymphatic: Aortic atherosclerosis. Newly enlarged, necrotic appearing porta hepatis node measuring 2.6 x 2.2 cm (series 3, image 33). New necrotic appearing lymph node or soft tissue implant in the splenic hilum adjacent to the tip of the pancreatic tail measuring 2.2 x 1.7 cm (series 3, image 23). Newly enlarged retroperitoneal lymph  nodes measuring up to 2.0 x 1.6 cm (series 3, image 40). 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 Other: Anasarca. New, large volume ascites throughout the abdomen and pelvis. Increased peritoneal caking and nodularity, particularly throughout the left upper quadrant (series 3, image 40). Musculoskeletal: No acute osseous findings. IMPRESSION: 1. Compared to most recent imaging of the chest dated 11/30/2023, numerous new and enlarged pulmonary nodules throughout the lungs, consistent with worsened pulmonary metastatic disease. 2. Compared to most recent imaging of the abdomen and pelvis dated 02/07/2024, innumerable new rim enhancing metastases of varying sizes throughout the liver with additional background of unchanged, treated calcified and hypodense liver metastases. 3. New, large volume ascites throughout the abdomen and pelvis.  Increased peritoneal caking and nodularity, particularly throughout the left upper quadrant, consistent with worsened peritoneal carcinomatosis. 4. Newly enlarged, necrotic appearing porta hepatis and retroperitoneal lymph nodes. New necrotic appearing lymph node or soft tissue implant in the splenic hilum adjacent to the tip of the pancreatic tail. 5. 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 6. Interval enlargement of cecal mass. Aortic Atherosclerosis (ICD10-I70.0). Electronically Signed   By: Marolyn JONETTA Jaksch M.D.   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  Patient's CEA trended up and peaked at 3110, but now has significantly declined to 712.  FOLFOX has been discontinued.  Proceed with cycle 2 of FOLFIRI today.  Avastin  has been discontinued altogether given her history of bowel perforation and obstruction.  Return to clinic in 2 days for pump removal, in 1 week for laboratory work only, in 2 weeks for further evaluation and consideration of cycle 3.   Uterine masses: Patient reports she has a history of benign fibroids.  Appreciate gynecology input.  Monitor.   Neutropenia: Resolved.  Proceed with treatment as above.  Will add Udenyca  with each cycle. Anemia: Hemoglobin significantly improved to 11.1 with transfusion.   Thrombocytopenia: Resolved. Hypokalemia: Chronic and unchanged.  Patient's potassium is stable at 3.3.  Continue oral potassium supplementation. Hyperbilirubinemia: Total bilirubin improved to 2.5.  Likely secondary to progressive disease.  Proceed with treatment as above.  Transaminitis: AST has trended up slightly to 137, monitor. Renal insufficiency:  Resolved. Insomnia:  Patient does not complain of this today. Coping/adjustment: Patient was previously given a referral to [facility]. Hypertension: Blood pressure was within normal limits today.   Diarrhea: Improved with OTC Imodium.  Continue Lomotil  as needed. Cramping: Patient does not complain of this today.  Continue Flexeril  as needed. Pelvic/back pain: Secondary to fall.  Patient does not complain of pain today.  X-rays from last week are still pending at time of dictation. Surgical wound: Patient was given a prescription for doxycycline  and referred back to surgery for further evaluation. History of hypothyroidism: Will get TSH and thyroid  panel today.   Patient expressed understanding and was in agreement with this plan. She also understands that She can call clinic at any time with any questions, concerns, or complaints.    Cancer Staging  Primary adenocarcinoma of ascending colon [facility]) Staging form: Colon and Rectum, AJCC 8th Edition -  Clinical stage from 08/17/2023: Stage IVB (cTX, cNX, pM1b) - Signed by Jacobo Evalene PARAS, MD on 08/17/2023 Stage prefix: Initial diagnosis   Evalene PARAS Jacobo, MD   06/13/2024 9:35 AM

## 2024-06-14 LAB — CEA: CEA: 2854 ng/mL — ABNORMAL HIGH (ref 0.0–4.7)

## 2024-06-14 LAB — THYROID PANEL WITH TSH
Free Thyroxine Index: 1 — ABNORMAL LOW (ref 1.2–4.9)
T3 Uptake Ratio: 25 % (ref 24–39)
T4, Total: 3.8 ug/dL — ABNORMAL LOW (ref 4.5–12.0)
TSH: 21.3 u[IU]/mL — ABNORMAL HIGH (ref 0.450–4.500)

## 2024-06-15 ENCOUNTER — Inpatient Hospital Stay

## 2024-06-15 VITALS — BP 108/83 | HR 62 | Temp 96.1°F | Resp 18

## 2024-06-15 DIAGNOSIS — C182 Malignant neoplasm of ascending colon: Secondary | ICD-10-CM

## 2024-06-15 DIAGNOSIS — Z5111 Encounter for antineoplastic chemotherapy: Secondary | ICD-10-CM | POA: Diagnosis not present

## 2024-06-15 MED ORDER — PEGFILGRASTIM-CBQV 6 MG/0.6ML ~~LOC~~ SOSY
6.0000 mg | PREFILLED_SYRINGE | Freq: Once | SUBCUTANEOUS | Status: AC
  Administered 2024-06-15: 6 mg via SUBCUTANEOUS
  Filled 2024-06-15: qty 0.6

## 2024-06-16 ENCOUNTER — Telehealth: Payer: Self-pay

## 2024-06-16 NOTE — Telephone Encounter (Addendum)
 Someone brought a form to the [employer].  I am unsure what, who, or why the form is needed.  Message left on voicemail to return call for clarification.

## 2024-06-17 NOTE — Telephone Encounter (Signed)
 Left message on voicemail for pt to call office for clarification.

## 2024-06-20 ENCOUNTER — Ambulatory Visit: Payer: Self-pay

## 2024-06-20 ENCOUNTER — Other Ambulatory Visit: Payer: Self-pay

## 2024-06-20 ENCOUNTER — Ambulatory Visit: Payer: Self-pay | Admitting: Oncology

## 2024-06-20 ENCOUNTER — Encounter: Payer: Self-pay | Admitting: Oncology

## 2024-06-20 NOTE — Care Plan (Signed)
   06/20/24 1812  Emergency Department Intervention  Service Provided by: Social Worker  Was Patient Admitted? No  Shift Evening  Referral Source ED MD  CM ED Other  Case Management evaluation ED Complete     CM consulted by provider as mobile is present to dc pt to the home. [facility] is asking for DNR form as pt is hospice, they noted without DNR form they will perform CPR if needed. CM contacted pt's dtr, Corean. Corean reported that pt does not have a POA, Corean lives with pt and is her emergency contact and cares for pt. Corean is agreeable to dcing pt without DNR form and will follow up with pt's providers in the community to complete DNR form. MD updated.   Glenard Griffiths, LCSW Case Management [phone] Pager:[phone]

## 2024-06-20 NOTE — ED Notes (Signed)
 Critical Care  Performed by: Kate Macario Raw, MD Authorized by: Kate Macario Raw, MD   Critical care provider statement:    Critical care time (minutes):  60   Critical care was necessary to treat or prevent imminent or life-threatening deterioration of the following conditions:  Sepsis   Critical care was time spent personally by me on the following activities:  Blood draw for specimens, development of treatment plan with patient or surrogate, discussions with consultants, evaluation of patient's response to treatment, examination of patient, ordering and performing treatments and interventions, ordering and review of laboratory studies, ordering and review of radiographic studies, pulse oximetry, re-evaluation of patient's condition and review of old charts

## 2024-06-20 NOTE — ED Provider Notes (Signed)
 [facility] Emergency Department Provider Note Room: [facility]  D41/D41  Medical Decision Making   Assessment & Plan Possible sepsis Treated as a possible case of sepsis due to the presence of an open wound from prior hernia surgery and oral mucosal bleeding. No reported fever, but the open wound raise concerns for infection. - Treat as possible sepsis - Bring family back to gather more information  Open wound from prior hernia surgery Open wound from hernia surgery six months ago. The wound is still bandaged and has not healed, contributing to the risk of infection and sepsis.  Oral mucosal bleeding Bleeding observed in the mouth, possibly from the lip or cheek. The source of the bleeding is unclear, and it is not due to biting the cheek. - Have her rinse her mouth to identify the source of bleeding - Use mouth swabs to clean the area  Acute pain Reports acute pain, rated as an eight out of ten. Morphine  administered without relief.  Stage 4 colon cancer Stage 4 colon cancer, which may complicate her current medical condition and treatment options.  Daughter here now.  Patient does have a history of stage IV colon cancer is currently on chemo and palliative care.  They all live in [place] and go to [place] but they decided to have a family gathering this weekend and came to [place].  They are going back in the morning.  They are just wanting pain control.  It does seem that when she goes to her cancer doctors that she is better and then at home she really is not able to get around is bed ridden which got worse this weekend No reported fevers.  Will talk with them more about workup and goals of care with her known diagnoses and her care is from out of town and they are planning to go back.  Progress Notes   3:19 AM -most recent office visit I do not see any medication that show she is on lactulose.  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  White blood cell count is low she is on chemo so that is to be expected no fever here.  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  Will touch base with surgery but with her disease at this state unlikely would be able to do surgery and daughter says she would not necessarily want a surgery and may want to go home and just be comfortable so we will have the surgery consult done and if that is the case we will work on hospice and palliative care  __________________   ED Observation Note Patient placed in ED Observation Status on Date: 06/20/24 Time: 5:00 AM.    HPI: This is a 63 y.o. female presenting with severe abdominal pain with history of colon cancer stage IV with liver mets and little altered mental status.  Workup revealed that she has a new bowel perforation that is not amenable to surgery and will need to be on hospice and palliative care  Physical Exam: ED Triage Vitals  Temp 06/20/24 0217 97.7 F (36.5 C)  Temp Source 06/20/24 0217 Oral  Heart Rate 06/20/24 0212 76  BP 06/20/24 0212 102/63  Respirations 06/20/24 0212 19  SpO2 06/20/24 0212 97 %   Reviewed vital signs and nursing note as charted by RN. Physical Exam GENERAL: Sleepy cooperative, thin female HEENT: Normocephalic, normal oropharynx, moist mucous membranes, oral cavity bleeding CHEST: Clear to auscultation bilaterally, no wheezes, rhonchi, or crackles CARDIOVASCULAR: Normal heart rate and rhythm, S1 and S2 normal without murmurs ABDOMEN: Soft, tender distended with infraorbital wound, without organomegaly, normal bowel sounds, abdomen with bandaged open wound  ED Observation Status: The reason for observation is to continue the patient's evaluation and treatment until the patient departs from the  ED. I will continue to monitor the patient for changes in clinical condition and address accordingly.  I will order additional medication and treatment, order and follow-up on testing results, and hold further discussions with consultants, as necessary.  ED Observation Progress:  5:15 AM -surgery does agree that she would need palliative care and does not recommend surgical intervention at this time.  Will work further with the daughter to try and arrange the palliative care and get the patient to either home or inpatient hospice with her team 6:04 AM - talked with palliative care for pt, they will call after 8a to help make plans [name] [phone] Cancer doc Alexis Reusing MD - primary will be in 8:30a (Dr. Melanee on call with let the team know) [phone] Will sign patient out to oncoming physician for follow-up and disposition plans.    Additionally, please refer to other progress notes during the patient's clinical course        MDM Elements Discussion of Management with other Physicians, QHP or Appropriate Source: Consultant: Surgery    I have reviewed recent and relevant previous record, including: Outpatient notes: Office visit 06/13/2024 with patient's oncology and admission in July with CT scans  Escalation of Care including OBS/Admission/Transfer was considered: Considered admitting for altered mental status or high ammonia over the perforation however none of these interventions would immediately help the patient and needs palliative services and since she is from out of town we will try and coordinate that       Disposition   Clinical Impression:     Diagnosis Comment Added By Time Added   Bowel perforation (CMS/HCC v28)  Kate Macario Raw, MD 06/20/2024  5:17 AM   Increased ammonia level  Kate Macario Raw, MD 06/20/2024  5:17 AM   Generalized abdominal pain  Kate Macario Raw, MD 06/20/2024  5:17 AM   Malignant neoplasm of colon, unspecified part of  colon (CMS/HCC v28)  Kate Macario Raw, MD 06/20/2024  5:17 AM      Final Disposition: pending  History   Chief Complaint in Triage  Patient presents with  . Abdominal Pain    BIBM from [employer] for worsening abd pain/weakness x2 days. Generalized sharp stabbing pain that does not worsen with palpitation. Hx stage 4 colon cancer and hx ruptured hernia roughly 6 months ago. GCS 15.     History of Present Illness Alexis Garcia is a 63 year old female with stage four colon cancer who presents with severe pain in the abdomen.  She is visiting here from out of town.  She is currently on chemotherapy.  All her care is in [place].  Family also is from out of town in [place] who help care for her including daughter  She experiences severe pain, rated as eight out of ten in abdomen She has a history of a  hernia that occurred six months ago which nonhealing wound,  which remains bandaged with an open wound. No fever is reported today.  She is visiting from [place] and has not been to this hospital before.  Addition Historian(s): Family - daughter EMS - gave 10 mg of morphine      MEDICATIONS:  Patient's Medications   No medications on file    ALLERGIES: Allergies[1]  PAST MEDICAL HISTORY:  Past Medical History[2]  PAST SURGICAL HISTORY: Past Surgical History[3]  SOCIAL HISTORY:  Social History   Tobacco Use  . Smoking status: Not on file  . Smokeless tobacco: Not on file  Substance Use Topics  . Alcohol use: Not on file       FAMILY HISTORY:  Family History[4]    Physical Exam    ED Triage Vitals  Temp 06/20/24 0217 97.7 F (36.5 C)  Temp Source 06/20/24 0217 Oral  Heart Rate 06/20/24 0212 76  BP 06/20/24 0212 102/63  Respirations 06/20/24 0212 19  SpO2 06/20/24 0212 97 %   Reviewed vital signs and nursing note as charted by RN.  Physical Exam GENERAL: Alert, cooperative, ill-appearing cachectic HEENT: Normocephalic, normal oropharynx, moist mucous membranes, oral cavity  dried blood in mouth.  There is a bite mark to the cheek, some yellowness to the eyes CHEST: Clear to auscultation bilaterally, no wheezes, rhonchi, or crackles CARDIOVASCULAR: Normal heart rate and rhythm, S1 and S2 normal without murmurs ABDOMEN: Soft, non-tender,  without organomegaly, normal bowel sounds, abdomen distended with bandaged open wound just infraumbilical with drainage EXTREMITIES: No cyanosis or edema NEUROLOGICAL: Sleepy but does wake up with stimulation and then seems to drift off cranial nerves grossly intact, moves all extremities without gross motor or sensory deficit      Results    I personally reviewed the results in the chart as listed below  Results for orders placed or performed during the hospital encounter of 06/20/24  COVID, RSV and Influenza A/B Amplified Probes  Result Value Ref Range   Influenza A, Amplified Probe Negative Negative   Influenza B, Amplified Probe Negative Negative   RSV, Amplified Probe Negative Negative   SARS-CoV-2 Negative Negative  CBC with Differential  Result Value Ref Range   Differential Percent Diff %    Differential Absolute Diff Absolute    WBC 0.9 (LL) 3.6 - 11.2 K/uL   RBC 3.59 (L) 3.63 - 4.92 M/uL   Hemoglobin 10.2 (L) 10.9 - 14.3 g/dL   Hematocrit 31 31 - 42 %   Mean Cell Volume 86 74 - 96 fL   Mean Cell Hemoglobin 28 24 - 33 pg   Mean Cell Hemoglobin Concentration 33 33 - 36 g/dL   RDW 78.7 (H) 87.6 - 82.9 %   Platelet Count 49 (L) 150 - 450 K/uL   Mean Platelet Volume 9.8 7.5 - 11.2 fL   Neutrophils 65 43 - 77 %   Lymphocytes 33 16 - 44 %   Monocytes 2 (L) 5 - 13 %   Eosinophils 0 (L) 1 - 8 %   Basophils 0 0 - 1 %   Neutrophils Absolute 0.6 (L) 1.8 - 7.8 K/uL   Lymphocytes Absolute 0.3 (L) 1.0 - 3.0 K/uL   Monocytes Absolute 0.0 (L) 0.3 - 1.0 K/uL   Eosinophils Absolute 0.0 0.0 - 0.5 K/uL   Basophils Absolute 0.0 0.0 - 0.1 K/uL   Nucleated RBCS 2 /100 WBC   Atypical Lymphs 0 %   Blasts 0 %    Promyelocytes 0 %   Myelocytes 0 %  Metamyelocytes 0 %   Plasma Cells 0 %   Anisocytosis MODERATE    Ovalocytes SLIGHT    Target Cells SLIGHT   CMP  Result Value Ref Range   Sodium 141 136 - 145 mmol/L   Potassium 5.3 (H) 3.5 - 5.1 mmol/L   Chloride 110 (H) 98 - 107 mmol/L   CO2 19 (L) 21 - 31 mmol/L   BUN 74 (H) 7 - 25 mg/dL   Creatinine 7.21 (H) 9.39 - 1.20 mg/dL   Glucose, Random 879 70 - 199 mg/dL   Calcium , Total 8.0 (L) 8.6 - 10.3 mg/dL   Osmolality (calculated) 304 (H) 270 - 295 mOsm/kg   Anion Gap 12 4 - 12   Albumin  2.0 (L) 3.5 - 5.7 g/dL   Bilirubin, Total 3.6 (H) 0.3 - 1.0 mg/dL   Alkaline Phosphatase 152 (H) 34 - 104 IU/L   ALT 13 7 - 52 IU/L   AST 76 (H) 13 - 39 IU/L   Protein, Total 5.8 (L) 6.4 - 8.9 g/dL   Albumin /Globulin Ratio 0.5 (L) 1.2 - 2.3  Magnesium Level  Result Value Ref Range   Magnesium 2.1 1.7 - 2.4 mg/dL  Lactic Acid (Initial)  Result Value Ref Range   Lactic Acid 2.3 (H) 0.5 - 2.0 mmol/L  PT/INR  Result Value Ref Range   PT 16.1 (H) 9.9 - 12.7 sec   INR 1.39 <1.30  Ammonia (NH3)  Result Value Ref Range   Ammonia 128 (H) 16 - 53 umol/L  Troponin I High Sensitivity  Result Value Ref Range   Troponin I High Sensitivity 19 (H) 0 - 18 pg/mL  Troponin I High Sensitivity (1 hour Repeat)  Result Value Ref Range   Troponin I High Sensitivity 18 0 - 18 pg/mL  Lipase  Result Value Ref Range   Lipase 98 (H) 11 - 82 U/L  Troponin Delta  Result Value Ref Range   Troponin Delta <4 pg/mL  eGFR (CKD-EPI)  Result Value Ref Range   eGFR 19 (A) mL/min/1.53m2  Lactic Acid (2hrs after Initial)  Result Value Ref Range   Lactic Acid 2.6 (H) 0.5 - 2.0 mmol/L  Type and Screen  Result Value Ref Range   ABO Group B    Rh Type POS    Antibody Screen NEG    Sample Expiration 06/23/2024 23:59   ABO/Rh Confirmation  Result Value Ref Range   Patient ABO Rh Confirmation B POS   Blood Gas, Venous  Result Value Ref Range   pH, Venous 7.391 7.310 - 7.410    PCO2, Venous 30.6 (L) 42.0 - 50.0 mmHg   PO2, Venous 81.5 (H) 35.0 - 41.0 mmHg   HCO3, Venous 18.5 (L) 22.0 - 26.0 mmol/L   Base Excess -6.4 (L) -2.0 - 2.0 mmol/L   O2 Saturation, Venous 96.0 (H) 70.0 - 76.0 %   Temp, Patient for Blood Gas Correction 37.0 Degrees   Sample Type, Blood Gas Venous    Imaging Results          CT Head Without IV Contrast (Final result)  Result time 06/20/24 03:56:31    Final result by Omar Harman Endo, MD (06/20/24 03:56:31)             Impression:   IMPRESSION:   *  No acute intracranial finding.  *  Nonspecific diminished attenuation in the deep periventricular white matter.          Narrative:   CT HEAD WITHOUT CONTRAST:  HISTORY:  Alteration of awareness. History of cancer  COMPARISON: None  CONTRAST: Study was performed without contrast.  TECHNIQUE: Routine axial images from the skull base to the vertex.  DICOM format image data available to non-affiliated external healthcare facilities or entities on a secure, media free, reciprocally searchable basis with patient authorization for at least a 12 month period after the study.  Dose reduction technique was used on this scan by utilizing automated exposure control, adjustment of the mA and/or kV according to the patient size.  FINDINGS:  Scout film: Unremarkable  Parenchyma: No mass, midline shift, or edema.  Moderate decrease attenuation within the white matter.  No intracranial hemorrhage or extraaxial fluid collection. Cerebellum and brainstem are without focal abnormality.  Ventricular system:     Ventricles appear normal.  Additional findings:     Calvarium intact. Orbits symmetric.  Sinuses and mastoid air cells are clear.                                    CT Chest/Abdomen/Pelvis Without IV Contrast (Final result)  Result time 06/20/24 04:11:31  Procedure changed from CT Chest/Abdomen/Pelvis With IV Contrast   Final result by Omar Harman Endo,  MD (06/20/24 04:11:31)             Impression:   IMPRESSION:  *  Moderate volume of pneumoperitoneum and large volume of free fluid in the abdomen and pelvis. Findings are concerning for bowel perforation.  *  Abdominal wound at the ventral abdomen extending to the level of the fascia, correlate with physical exam for integrity of the fascia.  *  Extensive metastatic disease in the chest, abdomen and pelvis as described above.  *  Osseous metastatic disease.  *  Results discussed with Dr. Donnel 06/20/2024 4:07 AM.          Narrative:   CT CHEST/ ABDOMEN/ PELVIS WITHOUT CONTRAST:   HISTORY:  Alteration of awareness. Abdominal pain.  COMPARISON: Chest radiograph 06/20/2024  CONTRAST: None  TECHNIQUE: Spiral CT performed from the pulmonary apex to the pubic symphysis . DICOM format image data available to non-affiliated external healthcare facilities or entities on a secure, media free, reciprocally searchable basis with patient authorization for at least a 12 month period after the study.  Dose reduction technique was used on this scan by utilizing automated exposure control, adjustment of the mA and/or kV according to the patient size.  FINDINGS:  Scout film: Unremarkable   Pulmonary:     Multifocal bilateral pulmonary nodules consistent with metastatic disease with the majority of nodules ranging from subcentimeter up to 1.3 cm. Minimal bibasilar subsegmental atelectasis. No pneumothorax.  Cardiovascular:     RIGHT port. Ectasia of the ascending aorta. Decreased attenuation of the blood pool consistent with anemia. No pericardial effusion.  Lymph nodes:     There is no mediastinal, hilar, or axillary lymphadenopathy.  Thoracic cage:     There is no suspicious osseous lesion or compression fracture.    ABDOMEN and PELVIS FINDINGS: Parenchymal evaluation is limited in the absence of intravenous contrast.  Hepatobiliary: Multifocal calcific densities in the liver with  irregular liver contour concerning for hepatic metastatic disease and pseudocirrhosis as well as potential intrinsic cirrhosis, correlate with history.   Moderate fatty infiltration of the pancreas with no acute finding in the pancreatic bed. Gallbladder not well visualized.   Normal splenic parenchyma.   GU: There is no evidence for renal  mass or hydronephrosis.  Normal ureteral caliber. Normal adrenal morphology.   No abnormality identified in the bladder.  GI: Stomach unremarkable. No dilated bowel loops. No significant colonic distention. Probable cecal mass is not well characterized. Concerns for carcinomatosis with partially calcified mass in the pelvis measuring up to 6 cm likely representing neoplasm, this does closely abuts the uterus which demonstrates a more typical appearance for a dense rim calcified 2.2 cm fibroid.  Moderate volume of pneumoperitoneum.  Large volume of free fluid.  Retroperitoneum: Retroperitoneal adenopathy. Aorta normal in caliber. No retroperitoneal hematoma.  Musculoskeletal: Abnormal sclerosis at the RIGHT parasymphyseal region and LEFT ischial tuberosity as well as elsewhere in the pelvis and sternum/spine consistent with osseous metastatic disease. Advanced destruction of L3 vertebral body.  Abdominal wound at the ventral abdomen extending to the level of the fascia (series 2 image 167), correlate with physical exam for integrity of the fascia.                                     XR Chest Portable (Final result)  Result time 06/20/24 04:00:42    Final result by Omar Harman Endo, MD (06/20/24 04:00:42)             Impression:   IMPRESSION:  *  Pneumoperitoneum. See comparison CT.  *  Multiple pulmonary metastases.          Narrative:   PORTABLE CHEST X-RAY  HISTORY:  Chest Pain  COMPARISON:  CT chest abdomen and pelvis 06/20/2024  TECHNIQUE: Portable single view of the chest.  FINDINGS:  RIGHT port. The heart  size is normal. The mediastinal contours are normal.   Multiple pulmonary metastases are better visualized on comparison study. No pneumothorax.  Pneumoperitoneum.  Chest wall grossly intact.                              ECG Results          ECG 12 lead; (Final result)  Result time 06/20/24 03:59:21    Final result             Impression:   Normal sinus rhythm Septal infarct , age undetermined Abnormal ECG No previous ECGs available Confirmed by BENSON, JILL ([id number]) on 06/20/2024 3:59:18 AM          Narrative:   Ventricular Rate: 64 Atrial Rate: 64 P-R Interval: 128 QRS Duration: 94 Q-T Interval: 432 QTC Calculation(Bazett): 445 P Axis: 39 R Axis: -15 T Axis: [address]                            Kate Macario Raw, MD 06/20/24 0616      [1] Not on File [2] No past medical history on file. [3] No past surgical history on file. [4] No family history on file.

## 2024-06-20 NOTE — Consults (Signed)
 ------------------------------------------------------------------------------- Attestation signed by Donnice Ivonne Salter, MD at 06/20/2024  6:47 AM Attending attestation: I saw and evaluated the patient performed the critical portions of the H&P reviewed the labs and imaging studies agree with documentation.  63 year old female with advanced metastatic cancer with concern for bowel perforation.  After long discussions with family and patient they opted to not pursue any surgical intervention. -------------------------------------------------------------------------------  [facility] Consult Note  Date of Service:  06/20/2024 Consulting Service:  General Surgery Requesting Physician:  Kate Macario Raw, MD Reason for Consult:  Abdominal pain   Brief History:  This 63 y.o. 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 Patient previously underwent a small bowel resection for an strangulated ventral hernia on 02/08/24. Patient just re-initiated chemotherapy recently following her operation. She and her family are traveling to [facility] for a family vacation. She has become weaker over the last several days. She has been telling her family she has abdominal pain and has no longer been sleeping through the night. She has continued to have bowel movements. No nausea or vomiting. Her family is already involved with palliative care given the aggressive nature of her disease. Patient's daughter and niece are at bedside who provided her history.   Past Medical History[1] - Adenocarcinoma of the colon, stage IVb   Past Surgical History[2] - exploratory laparotomy, SBR and reduction of strangulated ventral hernia - 02/08/24 - D&C - 2019 - Colonoscopy 2016, 2014  Allergies:  Allergies[3]  Social History:  The patient lives with family. Former tobacco user, denies alcohol and drug use  Family History:  patient is adopted  Code Status:  Full  Code  Review of Systems:  Unable to obtain in the patient's present condition.     Physical Examination:   VITAL SIGNS: BP 106/62 (BP Location: Right upper arm, BP Position: Supine)   Pulse 62   Temp 97.7 F (36.5 C) (Oral)   Resp 16   Ht 1.778 m (5' 10)   Wt 67.8 kg (149 lb 7.6 oz)   SpO2 95%   BMI 21.45 kg/m      Gen: frail, ill appearing elderly female, unable to answer questions, opens eyes to voice.  HEENT: EOMs intact, mucous membranes moist CV: regular rate, clinically well perfused Lungs: normal WOB on RA Abd: distended, diffuse tenderness to palpation, midline incision open and healing with fibrinous exudate.  Skin: warm and dry  Laboratory Data:   Recent Results (from the past 24 hours)  CBC with Differential   Collection Time: 06/20/24  2:33 AM  Result Value Ref Range   Differential Percent Diff %    Differential Absolute Diff Absolute    WBC 0.9 (LL) 3.6 - 11.2 K/uL   RBC 3.59 (L) 3.63 - 4.92 M/uL   Hemoglobin 10.2 (L) 10.9 - 14.3 g/dL   Hematocrit 31 31 - 42 %   Mean Cell Volume 86 74 - 96 fL   Mean Cell Hemoglobin 28 24 - 33 pg   Mean Cell Hemoglobin Concentration 33 33 - 36 g/dL   RDW 78.7 (H) 87.6 - 82.9 %   Platelet Count 49 (L) 150 - 450 K/uL   Mean Platelet Volume 9.8 7.5 - 11.2 fL   Neutrophils 65 43 - 77 %   Lymphocytes 33 16 - 44 %   Monocytes 2 (L) 5 - 13 %   Eosinophils 0 (L) 1 - 8 %   Basophils 0 0 - 1 %  Neutrophils Absolute 0.6 (L) 1.8 - 7.8 K/uL   Lymphocytes Absolute 0.3 (L) 1.0 - 3.0 K/uL   Monocytes Absolute 0.0 (L) 0.3 - 1.0 K/uL   Eosinophils Absolute 0.0 0.0 - 0.5 K/uL   Basophils Absolute 0.0 0.0 - 0.1 K/uL   Nucleated RBCS 2 /100 WBC   Atypical Lymphs 0 %   Blasts 0 %   Promyelocytes 0 %   Myelocytes 0 %   Metamyelocytes 0 %   Plasma Cells 0 %   Anisocytosis MODERATE    Ovalocytes SLIGHT    Target Cells SLIGHT   CMP   Collection Time: 06/20/24  2:33 AM  Result Value Ref Range   Sodium 141 136 - 145 mmol/L   Potassium  5.3 (H) 3.5 - 5.1 mmol/L   Chloride 110 (H) 98 - 107 mmol/L   CO2 19 (L) 21 - 31 mmol/L   BUN 74 (H) 7 - 25 mg/dL   Creatinine 7.21 (H) 9.39 - 1.20 mg/dL   Glucose, Random 879 70 - 199 mg/dL   Calcium , Total 8.0 (L) 8.6 - 10.3 mg/dL   Osmolality (calculated) 304 (H) 270 - 295 mOsm/kg   Anion Gap 12 4 - 12   Albumin  2.0 (L) 3.5 - 5.7 g/dL   Bilirubin, Total 3.6 (H) 0.3 - 1.0 mg/dL   Alkaline Phosphatase 152 (H) 34 - 104 IU/L   ALT 13 7 - 52 IU/L   AST 76 (H) 13 - 39 IU/L   Protein, Total 5.8 (L) 6.4 - 8.9 g/dL   Albumin /Globulin Ratio 0.5 (L) 1.2 - 2.3  Magnesium Level   Collection Time: 06/20/24  2:33 AM  Result Value Ref Range   Magnesium 2.1 1.7 - 2.4 mg/dL  Lactic Acid (Initial)   Collection Time: 06/20/24  2:33 AM  Result Value Ref Range   Lactic Acid 2.3 (H) 0.5 - 2.0 mmol/L  PT/INR   Collection Time: 06/20/24  2:33 AM  Result Value Ref Range   PT 16.1 (H) 9.9 - 12.7 sec   INR 1.39 <1.30  Ammonia (NH3)   Collection Time: 06/20/24  2:33 AM  Result Value Ref Range   Ammonia 128 (H) 16 - 53 umol/L  Troponin I High Sensitivity   Collection Time: 06/20/24  2:33 AM  Result Value Ref Range   Troponin I High Sensitivity 19 (H) 0 - 18 pg/mL  Lipase   Collection Time: 06/20/24  2:33 AM  Result Value Ref Range   Lipase 98 (H) 11 - 82 U/L  Type and Screen   Collection Time: 06/20/24  2:33 AM  Result Value Ref Range   ABO Group B    Rh Type POS    Antibody Screen NEG    Sample Expiration 06/23/2024 23:59   ABO/Rh Confirmation   Collection Time: 06/20/24  2:33 AM  Result Value Ref Range   Patient ABO Rh Confirmation B POS   eGFR (CKD-EPI)   Collection Time: 06/20/24  2:33 AM  Result Value Ref Range   eGFR 19 (A) mL/min/1.37m2  COVID, RSV and Influenza A/B Amplified Probes   Collection Time: 06/20/24  2:48 AM  Result Value Ref Range   Influenza A, Amplified Probe Negative Negative   Influenza B, Amplified Probe Negative Negative   RSV, Amplified Probe Negative  Negative   SARS-CoV-2 Negative Negative  Blood Gas, Venous   Collection Time: 06/20/24  3:35 AM  Result Value Ref Range   pH, Venous 7.391 7.310 - 7.410   PCO2, Venous 30.6 (  L) 42.0 - 50.0 mmHg   PO2, Venous 81.5 (H) 35.0 - 41.0 mmHg   HCO3, Venous 18.5 (L) 22.0 - 26.0 mmol/L   Base Excess -6.4 (L) -2.0 - 2.0 mmol/L   O2 Saturation, Venous 96.0 (H) 70.0 - 76.0 %   Temp, Patient for Blood Gas Correction 37.0 Degrees   Sample Type, Blood Gas Venous    Radiographic Data:  CT Chest/Abdomen/Pelvis Without IV Contrast Result Date: 06/20/2024 IMPRESSION: *  Moderate volume of pneumoperitoneum and large volume of free fluid in the abdomen and pelvis. Findings are concerning for bowel perforation. *  Abdominal wound at the ventral abdomen extending to the level of the fascia, correlate with physical exam for integrity of the fascia. *  Extensive metastatic disease in the chest, abdomen and pelvis as described above. *  Osseous metastatic disease. *  Results discussed with Dr. Donnel 06/20/2024 4:07 AM.  XR Chest Portable Result Date: 06/20/2024 IMPRESSION: *  Pneumoperitoneum. See comparison CT. *  Multiple pulmonary metastases.  ECG 12 lead; Result Date: 06/20/2024 Normal sinus rhythm Septal infarct , age undetermined Abnormal ECG No previous ECGs available Confirmed by DONNEL, JILL ([id number]) on 06/20/2024 3:59:18 AM  CT Head Without IV Contrast Result Date: 06/20/2024 IMPRESSION: *  No acute intracranial finding. *  Nonspecific diminished attenuation in the deep periventricular white matter.   I have reviewed this patient's diagnostic studies including lab data, radiographic data, hospital notes, and outside records.  Hospital Problems: Active Problems:   * No active hospital problems. *   Assessment/Plan:  63 y.o. female with metastatic colonic adenocarcinoma stage IVb with metastasis to the liver and lungs.  Now presenting with abdominal pain and distention.  Labs are significant for an ANC  of 0.6 and multiple metabolic derangements including elevated creatinine transaminases and bilirubin.  CT A/P with pneumoperitoneum concerning for bowel perforation.  I had a long discussion with the patient's daughter who is the primary caregiver.  The patient is a high risk surgical candidate given her recent chemotherapy treatment and advanced, aggressive cancer.  I explained that any operative intervention would likely lead to a prolonged ICU stay with possibly multiple operations.  Given the patient's advanced metastatic disease I am unsure that she would survive surgical intervention. We discussed what the patient would want and the daughter was in agreement, the patient would not want invasive measures.   Recommend engagement of palliative care and assistance with arrangements for home hospice if possible.   Alaina Zappas, MD General Surgery PGY4       [1] No past medical history on file. [2] No past surgical history on file. [3] Not on File

## 2024-06-20 NOTE — ED Notes (Signed)
 Patient moaning continuously - seems to be worse since daughter left. Appears anxious. Will ask provider if anything can be given.

## 2024-06-20 NOTE — Telephone Encounter (Signed)
 Call placed to emergency contact which is patient's daughter Alexis Garcia.  Alexis Garcia is unaware of the paperwork.  Proceeds to tell me that patient is in hospital in [place] and advising the outcome isn't good.

## 2024-06-20 NOTE — Care Plan (Signed)
   06/20/24 0827  Emergency Department Intervention  Service Provided by: Social Worker  Was Patient Admitted? No  Shift Day  Referral Source ED MD  Other Community Referrals Other Community- Note [facility])  CM ED Other  Case Management evaluation ED Complete     CM consulted by provider as pt's hospice agency, [facility], is requesting MD note and face sheet. CM emailed requested documents to [email or web address].    Glenard Griffiths, LCSW Case Management [phone] [phone]

## 2024-06-20 NOTE — ED Notes (Addendum)
 Mobile here to get patient. Waiting on clarification from family about code status d/t no portable DNR to give to mobile. Dr Sigmund spoke with case manager who stated that patient's daughter wants her a full code on the transfer home.

## 2024-06-20 NOTE — Progress Notes (Signed)
   06/20/24 0758  Spiritual Care Encounter Type  Visited With Patient  Staff Members Visited 1 people  Crisis Visit ED  On Call Visit No  Referral From Nurse  Referral Method Spiritual Care Consult  Visit Time (minutes) 15 minutes  Spiritual Assessment & Interventions  Patient Assessment Unable to Assess;Suffering  Patient Interventions Compassionate presence   Visited briefly with patient. Pt's primary nurse will contact me when family returns.

## 2024-06-21 ENCOUNTER — Inpatient Hospital Stay

## 2024-06-21 ENCOUNTER — Other Ambulatory Visit

## 2024-06-21 ENCOUNTER — Ambulatory Visit: Admitting: Oncology

## 2024-06-21 ENCOUNTER — Other Ambulatory Visit: Payer: Self-pay | Admitting: Oncology

## 2024-06-21 ENCOUNTER — Ambulatory Visit

## 2024-06-22 ENCOUNTER — Ambulatory Visit: Payer: Self-pay | Admitting: Surgery

## 2024-06-23 ENCOUNTER — Encounter

## 2024-06-27 ENCOUNTER — Ambulatory Visit

## 2024-06-27 ENCOUNTER — Ambulatory Visit: Admitting: Oncology

## 2024-06-27 ENCOUNTER — Other Ambulatory Visit

## 2024-06-29 ENCOUNTER — Encounter

## 2024-07-05 ENCOUNTER — Other Ambulatory Visit

## 2024-07-05 ENCOUNTER — Ambulatory Visit

## 2024-07-05 ENCOUNTER — Ambulatory Visit: Admitting: Oncology

## 2024-07-07 ENCOUNTER — Encounter

## 2024-07-11 DEATH — deceased

## 2024-07-12 ENCOUNTER — Ambulatory Visit

## 2024-07-12 ENCOUNTER — Inpatient Hospital Stay: Admitting: Hospice and Palliative Medicine

## 2024-07-12 ENCOUNTER — Other Ambulatory Visit

## 2024-07-12 ENCOUNTER — Ambulatory Visit: Admitting: Oncology

## 2024-07-13 ENCOUNTER — Encounter

## 2024-09-01 ENCOUNTER — Telehealth: Admitting: Hospice and Palliative Medicine
